# Patient Record
Sex: Male | Born: 1935 | Race: White | Hispanic: No | Marital: Married
Health system: Southern US, Community
[De-identification: ages and names within clinical notes are randomized; demographics above are authoritative.]

## PROBLEM LIST (undated history)

## (undated) DIAGNOSIS — K648 Other hemorrhoids: Secondary | ICD-10-CM

## (undated) DIAGNOSIS — D75839 Thrombocytosis, unspecified: Secondary | ICD-10-CM

## (undated) DIAGNOSIS — K5790 Diverticulosis of intestine, part unspecified, without perforation or abscess without bleeding: Secondary | ICD-10-CM

## (undated) DIAGNOSIS — I10 Essential (primary) hypertension: Secondary | ICD-10-CM

## (undated) DIAGNOSIS — K222 Esophageal obstruction: Secondary | ICD-10-CM

## (undated) DIAGNOSIS — H348122 Central retinal vein occlusion, left eye, stable: Secondary | ICD-10-CM

## (undated) DIAGNOSIS — M1990 Unspecified osteoarthritis, unspecified site: Secondary | ICD-10-CM

## (undated) DIAGNOSIS — D631 Anemia in chronic kidney disease: Secondary | ICD-10-CM

## (undated) DIAGNOSIS — Z5189 Encounter for other specified aftercare: Secondary | ICD-10-CM

## (undated) DIAGNOSIS — N189 Chronic kidney disease, unspecified: Secondary | ICD-10-CM

## (undated) DIAGNOSIS — K2210 Ulcer of esophagus without bleeding: Secondary | ICD-10-CM

## (undated) DIAGNOSIS — D473 Essential (hemorrhagic) thrombocythemia: Secondary | ICD-10-CM

## (undated) DIAGNOSIS — K219 Gastro-esophageal reflux disease without esophagitis: Secondary | ICD-10-CM

## (undated) DIAGNOSIS — I2510 Atherosclerotic heart disease of native coronary artery without angina pectoris: Secondary | ICD-10-CM

## (undated) DIAGNOSIS — I209 Angina pectoris, unspecified: Secondary | ICD-10-CM

## (undated) DIAGNOSIS — H9190 Unspecified hearing loss, unspecified ear: Secondary | ICD-10-CM

## (undated) DIAGNOSIS — D649 Anemia, unspecified: Secondary | ICD-10-CM

## (undated) HISTORY — DX: Ulcer of esophagus without bleeding: K22.10

## (undated) HISTORY — DX: Chronic kidney disease, unspecified: N18.9

## (undated) HISTORY — DX: Esophageal obstruction: K22.2

## (undated) HISTORY — DX: Atherosclerotic heart disease of native coronary artery without angina pectoris: I25.10

## (undated) HISTORY — DX: Anemia in chronic kidney disease: D63.1

## (undated) HISTORY — DX: Thrombocytosis, unspecified: D75.839

## (undated) HISTORY — DX: Central retinal vein occlusion, left eye, stable: H34.8122

## (undated) HISTORY — DX: Anemia, unspecified: D64.9

## (undated) HISTORY — DX: Encounter for other specified aftercare: Z51.89

## (undated) HISTORY — PX: EYE SURGERY: SHX253

## (undated) HISTORY — DX: Diverticulosis of intestine, part unspecified, without perforation or abscess without bleeding: K57.90

## (undated) HISTORY — DX: Other hemorrhoids: K64.8

## (undated) HISTORY — DX: Essential (hemorrhagic) thrombocythemia: D47.3

## (undated) HISTORY — PX: JOINT REPLACEMENT: SHX530

## (undated) HISTORY — PX: VASECTOMY: SHX75

## (undated) HISTORY — PX: OTHER SURGICAL HISTORY: SHX169

## (undated) HISTORY — DX: Essential (primary) hypertension: I10

---

## 1988-06-04 DIAGNOSIS — I209 Angina pectoris, unspecified: Secondary | ICD-10-CM

## 1988-06-04 HISTORY — DX: Angina pectoris, unspecified: I20.9

## 1999-01-30 ENCOUNTER — Ambulatory Visit (HOSPITAL_COMMUNITY): Admission: RE | Admit: 1999-01-30 | Discharge: 1999-01-30 | Payer: Self-pay | Admitting: *Deleted

## 1999-01-30 ENCOUNTER — Encounter: Payer: Self-pay | Admitting: *Deleted

## 1999-03-24 ENCOUNTER — Ambulatory Visit (HOSPITAL_COMMUNITY): Admission: RE | Admit: 1999-03-24 | Discharge: 1999-03-24 | Payer: Self-pay | Admitting: *Deleted

## 1999-03-24 ENCOUNTER — Encounter: Payer: Self-pay | Admitting: *Deleted

## 1999-05-11 ENCOUNTER — Ambulatory Visit (HOSPITAL_COMMUNITY): Admission: RE | Admit: 1999-05-11 | Discharge: 1999-05-11 | Payer: Self-pay | Admitting: Gastroenterology

## 1999-05-11 ENCOUNTER — Encounter: Payer: Self-pay | Admitting: Gastroenterology

## 1999-06-16 ENCOUNTER — Encounter: Payer: Self-pay | Admitting: Gastroenterology

## 1999-06-16 ENCOUNTER — Ambulatory Visit (HOSPITAL_COMMUNITY): Admission: RE | Admit: 1999-06-16 | Discharge: 1999-06-16 | Payer: Self-pay | Admitting: Gastroenterology

## 1999-07-25 ENCOUNTER — Encounter: Payer: Self-pay | Admitting: Gastroenterology

## 1999-07-25 ENCOUNTER — Encounter (INDEPENDENT_AMBULATORY_CARE_PROVIDER_SITE_OTHER): Payer: Self-pay

## 1999-07-25 ENCOUNTER — Ambulatory Visit (HOSPITAL_COMMUNITY): Admission: RE | Admit: 1999-07-25 | Discharge: 1999-07-25 | Payer: Self-pay | Admitting: Gastroenterology

## 1999-11-14 ENCOUNTER — Encounter: Payer: Self-pay | Admitting: Gastroenterology

## 1999-11-14 ENCOUNTER — Ambulatory Visit (HOSPITAL_COMMUNITY): Admission: RE | Admit: 1999-11-14 | Discharge: 1999-11-14 | Payer: Self-pay | Admitting: Gastroenterology

## 2000-07-03 ENCOUNTER — Encounter: Payer: Self-pay | Admitting: Urology

## 2000-07-04 ENCOUNTER — Ambulatory Visit (HOSPITAL_COMMUNITY): Admission: RE | Admit: 2000-07-04 | Discharge: 2000-07-04 | Payer: Self-pay | Admitting: Urology

## 2000-07-29 ENCOUNTER — Ambulatory Visit (HOSPITAL_COMMUNITY): Admission: RE | Admit: 2000-07-29 | Discharge: 2000-07-29 | Payer: Self-pay | Admitting: Gastroenterology

## 2000-07-29 ENCOUNTER — Encounter: Payer: Self-pay | Admitting: Gastroenterology

## 2001-02-26 ENCOUNTER — Encounter: Payer: Self-pay | Admitting: Gastroenterology

## 2001-02-26 ENCOUNTER — Ambulatory Visit (HOSPITAL_COMMUNITY): Admission: RE | Admit: 2001-02-26 | Discharge: 2001-02-26 | Payer: Self-pay | Admitting: Obstetrics & Gynecology

## 2001-05-08 ENCOUNTER — Ambulatory Visit (HOSPITAL_COMMUNITY): Admission: RE | Admit: 2001-05-08 | Discharge: 2001-05-08 | Payer: Self-pay | Admitting: General Surgery

## 2001-05-08 ENCOUNTER — Encounter (INDEPENDENT_AMBULATORY_CARE_PROVIDER_SITE_OTHER): Payer: Self-pay | Admitting: Specialist

## 2001-05-08 HISTORY — PX: INGUINAL HERNIA REPAIR: SUR1180

## 2002-04-27 ENCOUNTER — Encounter: Payer: Self-pay | Admitting: Gastroenterology

## 2002-04-27 ENCOUNTER — Ambulatory Visit (HOSPITAL_COMMUNITY): Admission: RE | Admit: 2002-04-27 | Discharge: 2002-04-27 | Payer: Self-pay | Admitting: Gastroenterology

## 2002-04-27 ENCOUNTER — Encounter (INDEPENDENT_AMBULATORY_CARE_PROVIDER_SITE_OTHER): Payer: Self-pay

## 2002-11-06 ENCOUNTER — Ambulatory Visit: Admission: RE | Admit: 2002-11-06 | Discharge: 2002-11-06 | Payer: Self-pay | Admitting: Family Medicine

## 2002-12-14 ENCOUNTER — Encounter: Payer: Self-pay | Admitting: Gastroenterology

## 2003-09-06 ENCOUNTER — Ambulatory Visit (HOSPITAL_COMMUNITY): Admission: RE | Admit: 2003-09-06 | Discharge: 2003-09-06 | Payer: Self-pay | Admitting: Gastroenterology

## 2004-04-15 ENCOUNTER — Ambulatory Visit: Payer: Self-pay | Admitting: Oncology

## 2004-06-12 ENCOUNTER — Ambulatory Visit: Payer: Self-pay | Admitting: Oncology

## 2004-08-06 ENCOUNTER — Ambulatory Visit: Payer: Self-pay | Admitting: Oncology

## 2004-09-05 ENCOUNTER — Ambulatory Visit (HOSPITAL_COMMUNITY): Admission: RE | Admit: 2004-09-05 | Discharge: 2004-09-05 | Payer: Self-pay | Admitting: Cardiology

## 2004-09-13 ENCOUNTER — Inpatient Hospital Stay (HOSPITAL_COMMUNITY): Admission: RE | Admit: 2004-09-13 | Discharge: 2004-09-18 | Payer: Self-pay | Admitting: Cardiothoracic Surgery

## 2004-09-29 ENCOUNTER — Ambulatory Visit: Payer: Self-pay | Admitting: Oncology

## 2004-10-05 ENCOUNTER — Ambulatory Visit: Payer: Self-pay | Admitting: Gastroenterology

## 2004-10-05 ENCOUNTER — Ambulatory Visit (HOSPITAL_COMMUNITY): Admission: RE | Admit: 2004-10-05 | Discharge: 2004-10-05 | Payer: Self-pay | Admitting: Gastroenterology

## 2004-10-08 ENCOUNTER — Inpatient Hospital Stay (HOSPITAL_COMMUNITY): Admission: EM | Admit: 2004-10-08 | Discharge: 2004-10-11 | Payer: Self-pay | Admitting: Emergency Medicine

## 2004-10-10 ENCOUNTER — Encounter (INDEPENDENT_AMBULATORY_CARE_PROVIDER_SITE_OTHER): Payer: Self-pay | Admitting: *Deleted

## 2004-10-13 ENCOUNTER — Encounter: Admission: RE | Admit: 2004-10-13 | Discharge: 2004-10-13 | Payer: Self-pay | Admitting: Cardiothoracic Surgery

## 2004-10-18 ENCOUNTER — Ambulatory Visit: Payer: Self-pay | Admitting: Gastroenterology

## 2004-10-23 ENCOUNTER — Ambulatory Visit: Payer: Self-pay | Admitting: Gastroenterology

## 2004-10-24 ENCOUNTER — Encounter (INDEPENDENT_AMBULATORY_CARE_PROVIDER_SITE_OTHER): Payer: Self-pay | Admitting: *Deleted

## 2004-10-24 ENCOUNTER — Ambulatory Visit: Payer: Self-pay | Admitting: Gastroenterology

## 2004-10-31 ENCOUNTER — Ambulatory Visit: Payer: Self-pay | Admitting: Gastroenterology

## 2004-11-01 ENCOUNTER — Encounter (HOSPITAL_COMMUNITY): Admission: RE | Admit: 2004-11-01 | Discharge: 2004-11-03 | Payer: Self-pay | Admitting: Oncology

## 2004-11-17 ENCOUNTER — Ambulatory Visit: Payer: Self-pay | Admitting: Oncology

## 2004-12-18 ENCOUNTER — Ambulatory Visit: Payer: Self-pay | Admitting: Gastroenterology

## 2005-01-12 ENCOUNTER — Ambulatory Visit: Payer: Self-pay | Admitting: Oncology

## 2005-03-02 ENCOUNTER — Ambulatory Visit: Payer: Self-pay | Admitting: Oncology

## 2005-04-30 ENCOUNTER — Ambulatory Visit: Payer: Self-pay | Admitting: Oncology

## 2005-06-25 ENCOUNTER — Ambulatory Visit: Payer: Self-pay | Admitting: Oncology

## 2005-08-17 ENCOUNTER — Ambulatory Visit: Payer: Self-pay | Admitting: Oncology

## 2005-09-17 LAB — CBC WITH DIFFERENTIAL/PLATELET
Basophils Absolute: 0 10*3/uL (ref 0.0–0.1)
Eosinophils Absolute: 0.1 10*3/uL (ref 0.0–0.5)
HGB: 10.9 g/dL — ABNORMAL LOW (ref 13.0–17.1)
LYMPH%: 20.4 % (ref 14.0–48.0)
MCV: 98.9 fL — ABNORMAL HIGH (ref 81.6–98.0)
MONO%: 9.4 % (ref 0.0–13.0)
NEUT#: 2.2 10*3/uL (ref 1.5–6.5)
Platelets: 291 10*3/uL (ref 145–400)
RBC: 3.24 10*6/uL — ABNORMAL LOW (ref 4.20–5.71)

## 2005-10-01 LAB — CBC WITH DIFFERENTIAL/PLATELET
BASO%: 1.1 % (ref 0.0–2.0)
LYMPH%: 20.7 % (ref 14.0–48.0)
MCH: 34.1 pg — ABNORMAL HIGH (ref 28.0–33.4)
MCHC: 34.3 g/dL (ref 32.0–35.9)
MCV: 99.4 fL — ABNORMAL HIGH (ref 81.6–98.0)
MONO%: 8.6 % (ref 0.0–13.0)
Platelets: 344 10*3/uL (ref 145–400)
RBC: 3.41 10*6/uL — ABNORMAL LOW (ref 4.20–5.71)

## 2005-10-12 ENCOUNTER — Ambulatory Visit: Payer: Self-pay | Admitting: Oncology

## 2005-10-16 LAB — COMPREHENSIVE METABOLIC PANEL
AST: 12 U/L (ref 0–37)
Albumin: 4 g/dL (ref 3.5–5.2)
BUN: 37 mg/dL — ABNORMAL HIGH (ref 6–23)
CO2: 19 mEq/L (ref 19–32)
Calcium: 8.3 mg/dL — ABNORMAL LOW (ref 8.4–10.5)
Chloride: 103 mEq/L (ref 96–112)
Creatinine, Ser: 1.7 mg/dL — ABNORMAL HIGH (ref 0.4–1.5)
Potassium: 5.8 mEq/L — ABNORMAL HIGH (ref 3.5–5.3)

## 2005-10-16 LAB — CBC WITH DIFFERENTIAL/PLATELET
Basophils Absolute: 0 10*3/uL (ref 0.0–0.1)
EOS%: 3.3 % (ref 0.0–7.0)
Eosinophils Absolute: 0.1 10*3/uL (ref 0.0–0.5)
HCT: 36.7 % — ABNORMAL LOW (ref 38.7–49.9)
HGB: 12.5 g/dL — ABNORMAL LOW (ref 13.0–17.1)
MCH: 33.7 pg — ABNORMAL HIGH (ref 28.0–33.4)
MONO#: 0.3 10*3/uL (ref 0.1–0.9)
NEUT#: 2.7 10*3/uL (ref 1.5–6.5)
NEUT%: 65.7 % (ref 40.0–75.0)
RDW: 13.8 % (ref 11.2–14.6)
WBC: 4.1 10*3/uL (ref 4.0–10.0)
lymph#: 0.9 10*3/uL (ref 0.9–3.3)

## 2005-10-16 LAB — LACTATE DEHYDROGENASE: LDH: 154 U/L (ref 94–250)

## 2005-10-30 ENCOUNTER — Ambulatory Visit: Payer: Self-pay | Admitting: Gastroenterology

## 2005-10-30 LAB — CBC WITH DIFFERENTIAL/PLATELET
EOS%: 2.3 % (ref 0.0–7.0)
HGB: 11.8 g/dL — ABNORMAL LOW (ref 13.0–17.1)
MCH: 33.6 pg — ABNORMAL HIGH (ref 28.0–33.4)
MCV: 97.7 fL (ref 81.6–98.0)
MONO%: 7.2 % (ref 0.0–13.0)
NEUT#: 3.4 10*3/uL (ref 1.5–6.5)
RBC: 3.5 10*6/uL — ABNORMAL LOW (ref 4.20–5.71)
RDW: 13.1 % (ref 11.2–14.6)
lymph#: 1.1 10*3/uL (ref 0.9–3.3)

## 2005-11-01 ENCOUNTER — Ambulatory Visit: Payer: Self-pay | Admitting: Gastroenterology

## 2005-11-05 LAB — CBC WITH DIFFERENTIAL/PLATELET
BASO%: 0.7 % (ref 0.0–2.0)
Basophils Absolute: 0 10*3/uL (ref 0.0–0.1)
Eosinophils Absolute: 0.1 10*3/uL (ref 0.0–0.5)
HCT: 32.3 % — ABNORMAL LOW (ref 38.7–49.9)
HGB: 10.9 g/dL — ABNORMAL LOW (ref 13.0–17.1)
LYMPH%: 16.2 % (ref 14.0–48.0)
MONO#: 0.4 10*3/uL (ref 0.1–0.9)
NEUT#: 4.2 10*3/uL (ref 1.5–6.5)
NEUT%: 73.5 % (ref 40.0–75.0)
Platelets: 408 10*3/uL — ABNORMAL HIGH (ref 145–400)
WBC: 5.7 10*3/uL (ref 4.0–10.0)
lymph#: 0.9 10*3/uL (ref 0.9–3.3)

## 2005-11-12 LAB — CBC WITH DIFFERENTIAL/PLATELET
BASO%: 0.8 % (ref 0.0–2.0)
EOS%: 2.3 % (ref 0.0–7.0)
LYMPH%: 20.5 % (ref 14.0–48.0)
MCH: 33.4 pg (ref 28.0–33.4)
MCHC: 34 g/dL (ref 32.0–35.9)
MONO#: 0.3 10*3/uL (ref 0.1–0.9)
NEUT%: 68.8 % (ref 40.0–75.0)
Platelets: 287 10*3/uL (ref 145–400)
RBC: 3.52 10*6/uL — ABNORMAL LOW (ref 4.20–5.71)
WBC: 4.5 10*3/uL (ref 4.0–10.0)

## 2005-11-26 ENCOUNTER — Ambulatory Visit: Payer: Self-pay | Admitting: Oncology

## 2005-11-26 LAB — CBC WITH DIFFERENTIAL/PLATELET
BASO%: 1.2 % (ref 0.0–2.0)
EOS%: 2.1 % (ref 0.0–7.0)
HCT: 36.8 % — ABNORMAL LOW (ref 38.7–49.9)
MCH: 34 pg — ABNORMAL HIGH (ref 28.0–33.4)
MCHC: 34.2 g/dL (ref 32.0–35.9)
MONO#: 0.3 10*3/uL (ref 0.1–0.9)
NEUT%: 70.4 % (ref 40.0–75.0)
RBC: 3.71 10*6/uL — ABNORMAL LOW (ref 4.20–5.71)
RDW: 15 % — ABNORMAL HIGH (ref 11.2–14.6)
WBC: 4.4 10*3/uL (ref 4.0–10.0)
lymph#: 0.8 10*3/uL — ABNORMAL LOW (ref 0.9–3.3)

## 2005-12-24 LAB — CBC WITH DIFFERENTIAL/PLATELET
BASO%: 0.5 % (ref 0.0–2.0)
Eosinophils Absolute: 0.1 10*3/uL (ref 0.0–0.5)
LYMPH%: 19.9 % (ref 14.0–48.0)
MCHC: 34.7 g/dL (ref 32.0–35.9)
MONO#: 0.3 10*3/uL (ref 0.1–0.9)
NEUT#: 3.5 10*3/uL (ref 1.5–6.5)
Platelets: 307 10*3/uL (ref 145–400)
RBC: 3 10*6/uL — ABNORMAL LOW (ref 4.20–5.71)
WBC: 4.9 10*3/uL (ref 4.0–10.0)
lymph#: 1 10*3/uL (ref 0.9–3.3)

## 2006-01-16 ENCOUNTER — Ambulatory Visit: Payer: Self-pay | Admitting: Oncology

## 2006-01-18 LAB — COMPREHENSIVE METABOLIC PANEL
ALT: <8 U/L (ref 0–40)
AST: 12 U/L (ref 0–37)
Albumin: 4.1 g/dL (ref 3.5–5.2)
CO2: 21 mEq/L (ref 19–32)
Calcium: 8.7 mg/dL (ref 8.4–10.5)
Chloride: 110 mEq/L (ref 96–112)
Creatinine, Ser: 1.69 mg/dL — ABNORMAL HIGH (ref 0.40–1.50)
Potassium: 5.7 mEq/L — ABNORMAL HIGH (ref 3.5–5.3)

## 2006-01-18 LAB — CBC WITH DIFFERENTIAL/PLATELET
BASO%: 1.3 % (ref 0.0–2.0)
Basophils Absolute: 0.1 10*3/uL (ref 0.0–0.1)
EOS%: 1.8 % (ref 0.0–7.0)
MCH: 33.7 pg — ABNORMAL HIGH (ref 28.0–33.4)
MCV: 99 fL — ABNORMAL HIGH (ref 81.6–98.0)
MONO#: 0.3 10*3/uL (ref 0.1–0.9)
NEUT#: 2.9 10*3/uL (ref 1.5–6.5)
NEUT%: 71.2 % (ref 40.0–75.0)
Platelets: 295 10*3/uL (ref 145–400)
RDW: 14.3 % (ref 11.2–14.6)
lymph#: 0.8 10*3/uL — ABNORMAL LOW (ref 0.9–3.3)

## 2006-02-05 LAB — CBC WITH DIFFERENTIAL/PLATELET
EOS%: 2 % (ref 0.0–7.0)
LYMPH%: 20.5 % (ref 14.0–48.0)
MCH: 34.7 pg — ABNORMAL HIGH (ref 28.0–33.4)
MCV: 101 fL — ABNORMAL HIGH (ref 81.6–98.0)
MONO%: 7.9 % (ref 0.0–13.0)
Platelets: 297 10*3/uL (ref 145–400)
RBC: 3.14 10*6/uL — ABNORMAL LOW (ref 4.20–5.71)
RDW: 14.9 % — ABNORMAL HIGH (ref 11.2–14.6)

## 2006-02-25 LAB — CBC WITH DIFFERENTIAL/PLATELET
BASO%: 0.7 % (ref 0.0–2.0)
Eosinophils Absolute: 0.1 10*3/uL (ref 0.0–0.5)
LYMPH%: 19.5 % (ref 14.0–48.0)
MCHC: 34 g/dL (ref 32.0–35.9)
MCV: 102.1 fL — ABNORMAL HIGH (ref 81.6–98.0)
MONO#: 0.3 10*3/uL (ref 0.1–0.9)
MONO%: 6.7 % (ref 0.0–13.0)
NEUT#: 2.9 10*3/uL (ref 1.5–6.5)
RBC: 3.24 10*6/uL — ABNORMAL LOW (ref 4.20–5.71)
RDW: 14.1 % (ref 11.2–14.6)
WBC: 4.1 10*3/uL (ref 4.0–10.0)

## 2006-03-14 ENCOUNTER — Ambulatory Visit: Payer: Self-pay | Admitting: Oncology

## 2006-03-18 LAB — CBC WITH DIFFERENTIAL/PLATELET
Basophils Absolute: 0 10*3/uL (ref 0.0–0.1)
Eosinophils Absolute: 0.1 10*3/uL (ref 0.0–0.5)
HCT: 31.3 % — ABNORMAL LOW (ref 38.7–49.9)
HGB: 10.7 g/dL — ABNORMAL LOW (ref 13.0–17.1)
LYMPH%: 18.5 % (ref 14.0–48.0)
MCV: 101.1 fL — ABNORMAL HIGH (ref 81.6–98.0)
MONO%: 6.5 % (ref 0.0–13.0)
NEUT#: 2.8 10*3/uL (ref 1.5–6.5)
NEUT%: 71.3 % (ref 40.0–75.0)
Platelets: 213 10*3/uL (ref 145–400)
RDW: 13.7 % (ref 11.2–14.6)

## 2006-04-08 LAB — CBC WITH DIFFERENTIAL/PLATELET
Basophils Absolute: 0.1 10*3/uL (ref 0.0–0.1)
EOS%: 2.7 % (ref 0.0–7.0)
Eosinophils Absolute: 0.1 10*3/uL (ref 0.0–0.5)
HCT: 32.1 % — ABNORMAL LOW (ref 38.7–49.9)
HGB: 10.9 g/dL — ABNORMAL LOW (ref 13.0–17.1)
MCH: 34.4 pg — ABNORMAL HIGH (ref 28.0–33.4)
NEUT#: 2.5 10*3/uL (ref 1.5–6.5)
NEUT%: 68.2 % (ref 40.0–75.0)
lymph#: 0.7 10*3/uL — ABNORMAL LOW (ref 0.9–3.3)

## 2006-04-08 LAB — COMPREHENSIVE METABOLIC PANEL
AST: 12 U/L (ref 0–37)
Albumin: 3.9 g/dL (ref 3.5–5.2)
BUN: 34 mg/dL — ABNORMAL HIGH (ref 6–23)
CO2: 21 mEq/L (ref 19–32)
Calcium: 8.2 mg/dL — ABNORMAL LOW (ref 8.4–10.5)
Chloride: 109 mEq/L (ref 96–112)
Creatinine, Ser: 1.47 mg/dL (ref 0.40–1.50)
Glucose, Bld: 139 mg/dL — ABNORMAL HIGH (ref 70–99)

## 2006-04-08 LAB — LACTATE DEHYDROGENASE: LDH: 152 U/L (ref 94–250)

## 2006-04-29 ENCOUNTER — Ambulatory Visit: Payer: Self-pay | Admitting: Oncology

## 2006-04-29 LAB — CBC WITH DIFFERENTIAL/PLATELET
Basophils Absolute: 0 10*3/uL (ref 0.0–0.1)
Eosinophils Absolute: 0.1 10*3/uL (ref 0.0–0.5)
HCT: 33.4 % — ABNORMAL LOW (ref 38.7–49.9)
HGB: 11.2 g/dL — ABNORMAL LOW (ref 13.0–17.1)
LYMPH%: 21.8 % (ref 14.0–48.0)
MONO#: 0.3 10*3/uL (ref 0.1–0.9)
NEUT%: 67.2 % (ref 40.0–75.0)
Platelets: 227 10*3/uL (ref 145–400)
WBC: 3.8 10*3/uL — ABNORMAL LOW (ref 4.0–10.0)
lymph#: 0.8 10*3/uL — ABNORMAL LOW (ref 0.9–3.3)

## 2006-06-04 HISTORY — PX: CORONARY ARTERY BYPASS GRAFT: SHX141

## 2006-06-10 LAB — CBC WITH DIFFERENTIAL/PLATELET
BASO%: 1.1 % (ref 0.0–2.0)
EOS%: 1.9 % (ref 0.0–7.0)
LYMPH%: 20.3 % (ref 14.0–48.0)
MCHC: 33.8 g/dL (ref 32.0–35.9)
MONO#: 0.3 10*3/uL (ref 0.1–0.9)
RBC: 2.8 10*6/uL — ABNORMAL LOW (ref 4.20–5.71)
WBC: 4.2 10*3/uL (ref 4.0–10.0)
lymph#: 0.8 10*3/uL — ABNORMAL LOW (ref 0.9–3.3)

## 2006-06-10 LAB — COMPREHENSIVE METABOLIC PANEL
ALT: 8 U/L (ref 0–53)
AST: 11 U/L (ref 0–37)
CO2: 19 mEq/L (ref 19–32)
Sodium: 134 mEq/L — ABNORMAL LOW (ref 135–145)
Total Bilirubin: 0.4 mg/dL (ref 0.3–1.2)
Total Protein: 6.3 g/dL (ref 6.0–8.3)

## 2006-06-26 ENCOUNTER — Ambulatory Visit: Payer: Self-pay | Admitting: Oncology

## 2006-07-01 LAB — CBC WITH DIFFERENTIAL/PLATELET
BASO%: 1.5 % (ref 0.0–2.0)
EOS%: 1.7 % (ref 0.0–7.0)
LYMPH%: 20.3 % (ref 14.0–48.0)
MCH: 34.7 pg — ABNORMAL HIGH (ref 28.0–33.4)
MCHC: 33.8 g/dL (ref 32.0–35.9)
MCV: 102.4 fL — ABNORMAL HIGH (ref 81.6–98.0)
MONO#: 0.3 10*3/uL (ref 0.1–0.9)
MONO%: 6.4 % (ref 0.0–13.0)
NEUT%: 70.1 % (ref 40.0–75.0)
Platelets: 281 10*3/uL (ref 145–400)
RBC: 3.14 10*6/uL — ABNORMAL LOW (ref 4.20–5.71)
WBC: 4 10*3/uL (ref 4.0–10.0)

## 2006-07-10 ENCOUNTER — Ambulatory Visit: Payer: Self-pay | Admitting: Vascular Surgery

## 2006-07-22 LAB — COMPREHENSIVE METABOLIC PANEL
Alkaline Phosphatase: 75 U/L (ref 39–117)
BUN: 49 mg/dL — ABNORMAL HIGH (ref 6–23)
Glucose, Bld: 137 mg/dL — ABNORMAL HIGH (ref 70–99)
Sodium: 134 mEq/L — ABNORMAL LOW (ref 135–145)
Total Bilirubin: 0.4 mg/dL (ref 0.3–1.2)

## 2006-07-22 LAB — CBC WITH DIFFERENTIAL/PLATELET
Basophils Absolute: 0.1 10*3/uL (ref 0.0–0.1)
Eosinophils Absolute: 0.1 10*3/uL (ref 0.0–0.5)
LYMPH%: 17.8 % (ref 14.0–48.0)
MCV: 101 fL — ABNORMAL HIGH (ref 81.6–98.0)
MONO%: 7.7 % (ref 0.0–13.0)
NEUT#: 2.7 10*3/uL (ref 1.5–6.5)
Platelets: 158 10*3/uL (ref 145–400)
RBC: 3.32 10*6/uL — ABNORMAL LOW (ref 4.20–5.71)

## 2006-08-07 ENCOUNTER — Ambulatory Visit: Payer: Self-pay | Admitting: Oncology

## 2006-08-12 LAB — CBC WITH DIFFERENTIAL/PLATELET
Eosinophils Absolute: 0.1 10*3/uL (ref 0.0–0.5)
HCT: 34.3 % — ABNORMAL LOW (ref 38.7–49.9)
HGB: 11.6 g/dL — ABNORMAL LOW (ref 13.0–17.1)
LYMPH%: 19.5 % (ref 14.0–48.0)
MONO#: 0.3 10*3/uL (ref 0.1–0.9)
NEUT#: 2.9 10*3/uL (ref 1.5–6.5)
NEUT%: 70.7 % (ref 40.0–75.0)
Platelets: 358 10*3/uL (ref 145–400)
WBC: 4.2 10*3/uL (ref 4.0–10.0)

## 2006-08-28 ENCOUNTER — Encounter: Admission: RE | Admit: 2006-08-28 | Discharge: 2006-08-28 | Payer: Self-pay | Admitting: Cardiology

## 2006-09-02 LAB — CBC WITH DIFFERENTIAL/PLATELET
BASO%: 1 % (ref 0.0–2.0)
HCT: 35.2 % — ABNORMAL LOW (ref 38.7–49.9)
LYMPH%: 19.9 % (ref 14.0–48.0)
MCH: 33.9 pg — ABNORMAL HIGH (ref 28.0–33.4)
MCHC: 34.2 g/dL (ref 32.0–35.9)
MONO#: 0.4 10*3/uL (ref 0.1–0.9)
NEUT%: 68.5 % (ref 40.0–75.0)
Platelets: 285 10*3/uL (ref 145–400)
WBC: 4.4 10*3/uL (ref 4.0–10.0)

## 2006-09-07 ENCOUNTER — Emergency Department (HOSPITAL_COMMUNITY): Admission: EM | Admit: 2006-09-07 | Discharge: 2006-09-07 | Payer: Self-pay | Admitting: Emergency Medicine

## 2006-09-23 ENCOUNTER — Ambulatory Visit: Payer: Self-pay | Admitting: Oncology

## 2006-09-23 LAB — CBC WITH DIFFERENTIAL/PLATELET
BASO%: 1.2 % (ref 0.0–2.0)
EOS%: 2.9 % (ref 0.0–7.0)
HCT: 33.6 % — ABNORMAL LOW (ref 38.7–49.9)
LYMPH%: 21.1 % (ref 14.0–48.0)
MCH: 34.1 pg — ABNORMAL HIGH (ref 28.0–33.4)
MCHC: 34.7 g/dL (ref 32.0–35.9)
MCV: 98.3 fL — ABNORMAL HIGH (ref 81.6–98.0)
MONO%: 7.8 % (ref 0.0–13.0)
NEUT%: 67 % (ref 40.0–75.0)
lymph#: 0.8 10*3/uL — ABNORMAL LOW (ref 0.9–3.3)

## 2006-10-14 LAB — CBC WITH DIFFERENTIAL/PLATELET
Basophils Absolute: 0 10*3/uL (ref 0.0–0.1)
EOS%: 2 % (ref 0.0–7.0)
HCT: 33.8 % — ABNORMAL LOW (ref 38.7–49.9)
HGB: 11.8 g/dL — ABNORMAL LOW (ref 13.0–17.1)
MCH: 34.1 pg — ABNORMAL HIGH (ref 28.0–33.4)
MCHC: 34.8 g/dL (ref 32.0–35.9)
MCV: 97.9 fL (ref 81.6–98.0)
MONO%: 7.2 % (ref 0.0–13.0)
NEUT%: 70.3 % (ref 40.0–75.0)
RDW: 13.5 % (ref 11.2–14.6)

## 2006-10-14 LAB — COMPREHENSIVE METABOLIC PANEL
AST: 10 U/L (ref 0–37)
Alkaline Phosphatase: 88 U/L (ref 39–117)
BUN: 49 mg/dL — ABNORMAL HIGH (ref 6–23)
Creatinine, Ser: 1.73 mg/dL — ABNORMAL HIGH (ref 0.40–1.50)

## 2006-10-17 ENCOUNTER — Ambulatory Visit: Payer: Self-pay | Admitting: Vascular Surgery

## 2006-10-23 ENCOUNTER — Ambulatory Visit (HOSPITAL_COMMUNITY): Admission: RE | Admit: 2006-10-23 | Discharge: 2006-10-23 | Payer: Self-pay | Admitting: *Deleted

## 2006-10-23 ENCOUNTER — Ambulatory Visit: Payer: Self-pay | Admitting: *Deleted

## 2006-11-04 LAB — CBC WITH DIFFERENTIAL/PLATELET
Basophils Absolute: 0 10*3/uL (ref 0.0–0.1)
Eosinophils Absolute: 0.1 10*3/uL (ref 0.0–0.5)
HCT: 33.4 % — ABNORMAL LOW (ref 38.7–49.9)
HGB: 11.6 g/dL — ABNORMAL LOW (ref 13.0–17.1)
LYMPH%: 19.8 % (ref 14.0–48.0)
MCV: 96.8 fL (ref 81.6–98.0)
MONO%: 7.6 % (ref 0.0–13.0)
NEUT#: 3.2 10*3/uL (ref 1.5–6.5)
Platelets: 290 10*3/uL (ref 145–400)

## 2006-11-07 ENCOUNTER — Ambulatory Visit: Payer: Self-pay | Admitting: Oncology

## 2006-11-25 LAB — CBC WITH DIFFERENTIAL/PLATELET
Eosinophils Absolute: 0.1 10*3/uL (ref 0.0–0.5)
LYMPH%: 18.7 % (ref 14.0–48.0)
MCHC: 34.1 g/dL (ref 32.0–35.9)
MCV: 97.4 fL (ref 81.6–98.0)
MONO%: 6.7 % (ref 0.0–13.0)
NEUT#: 2.9 10*3/uL (ref 1.5–6.5)
Platelets: 178 10*3/uL (ref 145–400)
RBC: 3.46 10*6/uL — ABNORMAL LOW (ref 4.20–5.71)

## 2006-12-16 LAB — CBC WITH DIFFERENTIAL/PLATELET
Basophils Absolute: 0 10*3/uL (ref 0.0–0.1)
Eosinophils Absolute: 0.1 10*3/uL (ref 0.0–0.5)
HGB: 11.4 g/dL — ABNORMAL LOW (ref 13.0–17.1)
MCV: 97.5 fL (ref 81.6–98.0)
MONO#: 0.3 10*3/uL (ref 0.1–0.9)
NEUT#: 3.3 10*3/uL (ref 1.5–6.5)
RBC: 3.37 10*6/uL — ABNORMAL LOW (ref 4.20–5.71)
RDW: 13.9 % (ref 11.2–14.6)
WBC: 4.5 10*3/uL (ref 4.0–10.0)
lymph#: 0.7 10*3/uL — ABNORMAL LOW (ref 0.9–3.3)

## 2007-01-08 ENCOUNTER — Ambulatory Visit: Payer: Self-pay | Admitting: Oncology

## 2007-01-10 LAB — CBC WITH DIFFERENTIAL/PLATELET
Basophils Absolute: 0 10*3/uL (ref 0.0–0.1)
Eosinophils Absolute: 0.1 10*3/uL (ref 0.0–0.5)
HCT: 31.1 % — ABNORMAL LOW (ref 38.7–49.9)
HGB: 11.1 g/dL — ABNORMAL LOW (ref 13.0–17.1)
LYMPH%: 18.4 % (ref 14.0–48.0)
MCV: 97.4 fL (ref 81.6–98.0)
MONO#: 0.4 10*3/uL (ref 0.1–0.9)
MONO%: 10.6 % (ref 0.0–13.0)
NEUT#: 2.4 10*3/uL (ref 1.5–6.5)
Platelets: 115 10*3/uL — ABNORMAL LOW (ref 145–400)
WBC: 3.5 10*3/uL — ABNORMAL LOW (ref 4.0–10.0)

## 2007-01-10 LAB — COMPREHENSIVE METABOLIC PANEL
Albumin: 3.8 g/dL (ref 3.5–5.2)
Alkaline Phosphatase: 83 U/L (ref 39–117)
BUN: 59 mg/dL — ABNORMAL HIGH (ref 6–23)
CO2: 20 mEq/L (ref 19–32)
Glucose, Bld: 128 mg/dL — ABNORMAL HIGH (ref 70–99)
Sodium: 132 mEq/L — ABNORMAL LOW (ref 135–145)
Total Bilirubin: 0.5 mg/dL (ref 0.3–1.2)
Total Protein: 6.2 g/dL (ref 6.0–8.3)

## 2007-01-27 LAB — CBC WITH DIFFERENTIAL/PLATELET
Basophils Absolute: 0 10*3/uL (ref 0.0–0.1)
Eosinophils Absolute: 0.1 10*3/uL (ref 0.0–0.5)
HGB: 8.8 g/dL — ABNORMAL LOW (ref 13.0–17.1)
MCV: 97 fL (ref 81.6–98.0)
MONO#: 0.4 10*3/uL (ref 0.1–0.9)
NEUT#: 3.2 10*3/uL (ref 1.5–6.5)
RBC: 2.48 10*6/uL — ABNORMAL LOW (ref 4.20–5.71)
RDW: 13 % (ref 11.2–14.6)
WBC: 4.2 10*3/uL (ref 4.0–10.0)

## 2007-02-10 LAB — CBC WITH DIFFERENTIAL/PLATELET
Basophils Absolute: 0 10*3/uL (ref 0.0–0.1)
Eosinophils Absolute: 0.1 10*3/uL (ref 0.0–0.5)
HCT: 27.6 % — ABNORMAL LOW (ref 38.7–49.9)
HGB: 9.9 g/dL — ABNORMAL LOW (ref 13.0–17.1)
LYMPH%: 18.2 % (ref 14.0–48.0)
MCV: 100.1 fL — ABNORMAL HIGH (ref 81.6–98.0)
MONO#: 0.4 10*3/uL (ref 0.1–0.9)
MONO%: 10.2 % (ref 0.0–13.0)
NEUT#: 2.5 10*3/uL (ref 1.5–6.5)
NEUT%: 68.9 % (ref 40.0–75.0)
Platelets: 162 10*3/uL (ref 145–400)
WBC: 3.6 10*3/uL — ABNORMAL LOW (ref 4.0–10.0)

## 2007-02-19 ENCOUNTER — Ambulatory Visit: Payer: Self-pay | Admitting: Oncology

## 2007-02-24 LAB — CBC WITH DIFFERENTIAL/PLATELET
BASO%: 1.2 % (ref 0.0–2.0)
HCT: 31.5 % — ABNORMAL LOW (ref 38.7–49.9)
LYMPH%: 14.9 % (ref 14.0–48.0)
MCH: 35.7 pg — ABNORMAL HIGH (ref 28.0–33.4)
MCHC: 35 g/dL (ref 32.0–35.9)
MCV: 102.1 fL — ABNORMAL HIGH (ref 81.6–98.0)
MONO#: 0.4 10*3/uL (ref 0.1–0.9)
MONO%: 10.3 % (ref 0.0–13.0)
NEUT%: 71.4 % (ref 40.0–75.0)
Platelets: 131 10*3/uL — ABNORMAL LOW (ref 145–400)
WBC: 3.6 10*3/uL — ABNORMAL LOW (ref 4.0–10.0)

## 2007-02-24 LAB — BASIC METABOLIC PANEL
BUN: 59 mg/dL — ABNORMAL HIGH (ref 6–23)
Calcium: 8.6 mg/dL (ref 8.4–10.5)
Glucose, Bld: 136 mg/dL — ABNORMAL HIGH (ref 70–99)

## 2007-03-10 LAB — CBC WITH DIFFERENTIAL/PLATELET
BASO%: 0.6 % (ref 0.0–2.0)
EOS%: 3.8 % (ref 0.0–7.0)
LYMPH%: 22.2 % (ref 14.0–48.0)
MCH: 35.8 pg — ABNORMAL HIGH (ref 28.0–33.4)
MCHC: 35.2 g/dL (ref 32.0–35.9)
MONO#: 0.4 10*3/uL (ref 0.1–0.9)
RBC: 3.3 10*6/uL — ABNORMAL LOW (ref 4.20–5.71)
WBC: 2.9 10*3/uL — ABNORMAL LOW (ref 4.0–10.0)
lymph#: 0.6 10*3/uL — ABNORMAL LOW (ref 0.9–3.3)

## 2007-03-24 LAB — CBC WITH DIFFERENTIAL/PLATELET
BASO%: 1 % (ref 0.0–2.0)
EOS%: 2.3 % (ref 0.0–7.0)
HCT: 31.6 % — ABNORMAL LOW (ref 38.7–49.9)
HGB: 10.9 g/dL — ABNORMAL LOW (ref 13.0–17.1)
MCHC: 34.4 g/dL (ref 32.0–35.9)
MONO#: 0.4 10*3/uL (ref 0.1–0.9)
NEUT%: 64.2 % (ref 40.0–75.0)
RDW: 12.8 % (ref 11.2–14.6)
WBC: 3.2 10*3/uL — ABNORMAL LOW (ref 4.0–10.0)
lymph#: 0.6 10*3/uL — ABNORMAL LOW (ref 0.9–3.3)

## 2007-03-24 LAB — COMPREHENSIVE METABOLIC PANEL
ALT: 8 U/L (ref 0–53)
AST: 13 U/L (ref 0–37)
Albumin: 4 g/dL (ref 3.5–5.2)
CO2: 24 mEq/L (ref 19–32)
Calcium: 8.4 mg/dL (ref 8.4–10.5)
Chloride: 100 mEq/L (ref 96–112)
Creatinine, Ser: 1.59 mg/dL — ABNORMAL HIGH (ref 0.40–1.50)
Potassium: 4.9 mEq/L (ref 3.5–5.3)
Sodium: 136 mEq/L (ref 135–145)
Total Protein: 6.3 g/dL (ref 6.0–8.3)

## 2007-03-24 LAB — LACTATE DEHYDROGENASE: LDH: 162 U/L (ref 94–250)

## 2007-04-07 ENCOUNTER — Ambulatory Visit: Payer: Self-pay | Admitting: Gastroenterology

## 2007-04-10 ENCOUNTER — Ambulatory Visit: Payer: Self-pay | Admitting: Oncology

## 2007-04-14 LAB — CBC WITH DIFFERENTIAL/PLATELET
EOS%: 2.9 % (ref 0.0–7.0)
Eosinophils Absolute: 0.1 10*3/uL (ref 0.0–0.5)
LYMPH%: 19.2 % (ref 14.0–48.0)
MCH: 35.3 pg — ABNORMAL HIGH (ref 28.0–33.4)
MCV: 98.9 fL — ABNORMAL HIGH (ref 81.6–98.0)
MONO%: 13.5 % — ABNORMAL HIGH (ref 0.0–13.0)
NEUT#: 2.8 10*3/uL (ref 1.5–6.5)
Platelets: 289 10*3/uL (ref 145–400)
RBC: 2.71 10*6/uL — ABNORMAL LOW (ref 4.20–5.71)
RDW: 12.7 % (ref 11.2–14.6)

## 2007-04-15 ENCOUNTER — Ambulatory Visit (HOSPITAL_COMMUNITY): Admission: RE | Admit: 2007-04-15 | Discharge: 2007-04-15 | Payer: Self-pay | Admitting: Gastroenterology

## 2007-04-18 ENCOUNTER — Ambulatory Visit: Payer: Self-pay | Admitting: Gastroenterology

## 2007-04-29 ENCOUNTER — Ambulatory Visit (HOSPITAL_COMMUNITY): Admission: RE | Admit: 2007-04-29 | Discharge: 2007-04-29 | Payer: Self-pay | Admitting: Gastroenterology

## 2007-05-05 LAB — CBC WITH DIFFERENTIAL/PLATELET
BASO%: 0.8 % (ref 0.0–2.0)
LYMPH%: 17.3 % (ref 14.0–48.0)
MCHC: 34.8 g/dL (ref 32.0–35.9)
MCV: 98.1 fL — ABNORMAL HIGH (ref 81.6–98.0)
MONO#: 0.4 10*3/uL (ref 0.1–0.9)
MONO%: 7.9 % (ref 0.0–13.0)
Platelets: 306 10*3/uL (ref 145–400)
RBC: 3.04 10*6/uL — ABNORMAL LOW (ref 4.20–5.71)
RDW: 13.1 % (ref 11.2–14.6)
WBC: 4.7 10*3/uL (ref 4.0–10.0)

## 2007-05-22 ENCOUNTER — Ambulatory Visit: Payer: Self-pay | Admitting: Oncology

## 2007-05-26 LAB — CBC WITH DIFFERENTIAL/PLATELET
BASO%: 1.3 % (ref 0.0–2.0)
MCHC: 35.1 g/dL (ref 32.0–35.9)
MONO#: 0.3 10*3/uL (ref 0.1–0.9)
NEUT#: 3 10*3/uL (ref 1.5–6.5)
RBC: 3.09 10*6/uL — ABNORMAL LOW (ref 4.20–5.71)
RDW: 13.7 % (ref 11.2–14.6)
WBC: 4.4 10*3/uL (ref 4.0–10.0)
lymph#: 0.8 10*3/uL — ABNORMAL LOW (ref 0.9–3.3)

## 2007-06-09 ENCOUNTER — Ambulatory Visit: Payer: Self-pay | Admitting: Gastroenterology

## 2007-06-16 ENCOUNTER — Ambulatory Visit: Payer: Self-pay | Admitting: *Deleted

## 2007-06-16 LAB — CBC WITH DIFFERENTIAL/PLATELET
BASO%: 1 % (ref 0.0–2.0)
Basophils Absolute: 0.1 10*3/uL (ref 0.0–0.1)
Eosinophils Absolute: 0.2 10*3/uL (ref 0.0–0.5)
HCT: 34.3 % — ABNORMAL LOW (ref 38.7–49.9)
HGB: 11.6 g/dL — ABNORMAL LOW (ref 13.0–17.1)
MCHC: 34 g/dL (ref 32.0–35.9)
MONO#: 0.4 10*3/uL (ref 0.1–0.9)
NEUT#: 3.6 10*3/uL (ref 1.5–6.5)
NEUT%: 69.3 % (ref 40.0–75.0)
WBC: 5.2 10*3/uL (ref 4.0–10.0)
lymph#: 1 10*3/uL (ref 0.9–3.3)

## 2007-06-16 LAB — COMPREHENSIVE METABOLIC PANEL
ALT: 8 U/L (ref 0–53)
CO2: 20 mEq/L (ref 19–32)
Calcium: 8.8 mg/dL (ref 8.4–10.5)
Chloride: 103 mEq/L (ref 96–112)
Creatinine, Ser: 2.5 mg/dL — ABNORMAL HIGH (ref 0.40–1.50)

## 2007-06-16 LAB — LACTATE DEHYDROGENASE: LDH: 123 U/L (ref 94–250)

## 2007-06-19 ENCOUNTER — Ambulatory Visit (HOSPITAL_COMMUNITY): Admission: RE | Admit: 2007-06-19 | Discharge: 2007-06-19 | Payer: Self-pay | Admitting: Gastroenterology

## 2007-07-03 ENCOUNTER — Ambulatory Visit: Payer: Self-pay | Admitting: Oncology

## 2007-07-07 LAB — CBC WITH DIFFERENTIAL/PLATELET
BASO%: 0.6 % (ref 0.0–2.0)
MCHC: 33.9 g/dL (ref 32.0–35.9)
MONO#: 0.5 10*3/uL (ref 0.1–0.9)
RBC: 2.94 10*6/uL — ABNORMAL LOW (ref 4.20–5.71)
WBC: 6.2 10*3/uL (ref 4.0–10.0)
lymph#: 1 10*3/uL (ref 0.9–3.3)

## 2007-07-08 ENCOUNTER — Ambulatory Visit: Payer: Self-pay | Admitting: Gastroenterology

## 2007-07-28 LAB — CBC WITH DIFFERENTIAL/PLATELET
BASO%: 0.3 % (ref 0.0–2.0)
Basophils Absolute: 0 10*3/uL (ref 0.0–0.1)
HCT: 30.8 % — ABNORMAL LOW (ref 38.7–49.9)
HGB: 10.8 g/dL — ABNORMAL LOW (ref 13.0–17.1)
MCHC: 35.1 g/dL (ref 32.0–35.9)
MONO#: 0.3 10*3/uL (ref 0.1–0.9)
NEUT%: 67.6 % (ref 40.0–75.0)
WBC: 4.3 10*3/uL (ref 4.0–10.0)
lymph#: 1 10*3/uL (ref 0.9–3.3)

## 2007-08-14 ENCOUNTER — Ambulatory Visit: Payer: Self-pay | Admitting: Oncology

## 2007-08-18 LAB — CBC WITH DIFFERENTIAL/PLATELET
BASO%: 0.9 % (ref 0.0–2.0)
LYMPH%: 20 % (ref 14.0–48.0)
MCHC: 33.9 g/dL (ref 32.0–35.9)
MCV: 96.3 fL (ref 81.6–98.0)
MONO#: 0.4 10*3/uL (ref 0.1–0.9)
MONO%: 7.7 % (ref 0.0–13.0)
Platelets: 231 10*3/uL (ref 145–400)
RBC: 3.4 10*6/uL — ABNORMAL LOW (ref 4.20–5.71)
WBC: 4.9 10*3/uL (ref 4.0–10.0)

## 2007-09-08 LAB — COMPREHENSIVE METABOLIC PANEL
ALT: <8 U/L (ref 0–53)
Alkaline Phosphatase: 77 U/L (ref 39–117)
CO2: 23 mEq/L (ref 19–32)
Creatinine, Ser: 1.86 mg/dL — ABNORMAL HIGH (ref 0.40–1.50)
Glucose, Bld: 138 mg/dL — ABNORMAL HIGH (ref 70–99)
Sodium: 135 mEq/L (ref 135–145)
Total Bilirubin: 0.4 mg/dL (ref 0.3–1.2)

## 2007-09-08 LAB — CBC WITH DIFFERENTIAL/PLATELET
BASO%: 1.6 % (ref 0.0–2.0)
HCT: 33.8 % — ABNORMAL LOW (ref 38.7–49.9)
LYMPH%: 21 % (ref 14.0–48.0)
MCHC: 33.6 g/dL (ref 32.0–35.9)
MCV: 97.9 fL (ref 81.6–98.0)
MONO#: 0.3 10*3/uL (ref 0.1–0.9)
MONO%: 6.6 % (ref 0.0–13.0)
NEUT%: 68.7 % (ref 40.0–75.0)
Platelets: 231 10*3/uL (ref 145–400)
RBC: 3.46 10*6/uL — ABNORMAL LOW (ref 4.20–5.71)
WBC: 4.8 10*3/uL (ref 4.0–10.0)

## 2007-09-08 LAB — LACTATE DEHYDROGENASE: LDH: 123 U/L (ref 94–250)

## 2007-09-15 ENCOUNTER — Ambulatory Visit: Payer: Self-pay | Admitting: Gastroenterology

## 2007-09-24 ENCOUNTER — Ambulatory Visit: Payer: Self-pay | Admitting: Oncology

## 2007-09-29 LAB — CBC WITH DIFFERENTIAL/PLATELET
Basophils Absolute: 0 10*3/uL (ref 0.0–0.1)
HCT: 34.2 % — ABNORMAL LOW (ref 38.7–49.9)
HGB: 11.7 g/dL — ABNORMAL LOW (ref 13.0–17.1)
LYMPH%: 19 % (ref 14.0–48.0)
MONO#: 0.4 10*3/uL (ref 0.1–0.9)
NEUT%: 70.6 % (ref 40.0–75.0)
Platelets: 212 10*3/uL (ref 145–400)
WBC: 4.9 10*3/uL (ref 4.0–10.0)
lymph#: 0.9 10*3/uL (ref 0.9–3.3)

## 2007-10-13 ENCOUNTER — Encounter: Payer: Self-pay | Admitting: Gastroenterology

## 2007-10-13 ENCOUNTER — Ambulatory Visit (HOSPITAL_COMMUNITY): Admission: RE | Admit: 2007-10-13 | Discharge: 2007-10-13 | Payer: Self-pay | Admitting: Gastroenterology

## 2007-10-23 ENCOUNTER — Ambulatory Visit: Payer: Self-pay | Admitting: Gastroenterology

## 2007-10-24 ENCOUNTER — Telehealth: Payer: Self-pay | Admitting: Gastroenterology

## 2007-11-06 ENCOUNTER — Ambulatory Visit: Payer: Self-pay | Admitting: Oncology

## 2007-11-10 LAB — CBC WITH DIFFERENTIAL/PLATELET
Basophils Absolute: 0.1 10*3/uL (ref 0.0–0.1)
Eosinophils Absolute: 0.1 10*3/uL (ref 0.0–0.5)
HGB: 11 g/dL — ABNORMAL LOW (ref 13.0–17.1)
MCV: 95 fL (ref 81.6–98.0)
MONO#: 0.3 10*3/uL (ref 0.1–0.9)
NEUT#: 3.2 10*3/uL (ref 1.5–6.5)
RDW: 14.5 % (ref 11.2–14.6)
WBC: 4.3 10*3/uL (ref 4.0–10.0)
lymph#: 0.7 10*3/uL — ABNORMAL LOW (ref 0.9–3.3)

## 2007-11-17 LAB — CBC WITH DIFFERENTIAL/PLATELET
Basophils Absolute: 0 10*3/uL (ref 0.0–0.1)
Eosinophils Absolute: 0.1 10*3/uL (ref 0.0–0.5)
HGB: 10.5 g/dL — ABNORMAL LOW (ref 13.0–17.1)
MCV: 96.1 fL (ref 81.6–98.0)
MONO#: 0.5 10*3/uL (ref 0.1–0.9)
MONO%: 9.3 % (ref 0.0–13.0)
NEUT#: 3.5 10*3/uL (ref 1.5–6.5)
RBC: 3.2 10*6/uL — ABNORMAL LOW (ref 4.20–5.71)
RDW: 14.7 % — ABNORMAL HIGH (ref 11.2–14.6)
WBC: 4.9 10*3/uL (ref 4.0–10.0)
lymph#: 0.8 10*3/uL — ABNORMAL LOW (ref 0.9–3.3)

## 2007-12-01 LAB — CBC WITH DIFFERENTIAL/PLATELET
Basophils Absolute: 0 10*3/uL (ref 0.0–0.1)
Eosinophils Absolute: 0.1 10*3/uL (ref 0.0–0.5)
HCT: 30.4 % — ABNORMAL LOW (ref 38.7–49.9)
HGB: 10.5 g/dL — ABNORMAL LOW (ref 13.0–17.1)
LYMPH%: 18.5 % (ref 14.0–48.0)
MCV: 96.2 fL (ref 81.6–98.0)
MONO#: 0.4 10*3/uL (ref 0.1–0.9)
MONO%: 9.3 % (ref 0.0–13.0)
NEUT#: 2.6 10*3/uL (ref 1.5–6.5)
NEUT%: 68.7 % (ref 40.0–75.0)
Platelets: 240 10*3/uL (ref 145–400)
WBC: 3.8 10*3/uL — ABNORMAL LOW (ref 4.0–10.0)

## 2007-12-17 ENCOUNTER — Ambulatory Visit: Payer: Self-pay | Admitting: Oncology

## 2007-12-22 LAB — CBC WITH DIFFERENTIAL/PLATELET
BASO%: 0.7 % (ref 0.0–2.0)
Basophils Absolute: 0 10*3/uL (ref 0.0–0.1)
Eosinophils Absolute: 0.1 10*3/uL (ref 0.0–0.5)
HCT: 29.5 % — ABNORMAL LOW (ref 38.7–49.9)
HGB: 10.3 g/dL — ABNORMAL LOW (ref 13.0–17.1)
MONO#: 0.3 10*3/uL (ref 0.1–0.9)
NEUT#: 2.8 10*3/uL (ref 1.5–6.5)
NEUT%: 71.7 % (ref 40.0–75.0)
WBC: 4 10*3/uL (ref 4.0–10.0)
lymph#: 0.6 10*3/uL — ABNORMAL LOW (ref 0.9–3.3)

## 2007-12-22 LAB — COMPREHENSIVE METABOLIC PANEL
ALT: <8 U/L (ref 0–53)
BUN: 58 mg/dL — ABNORMAL HIGH (ref 6–23)
CO2: 22 mEq/L (ref 19–32)
Calcium: 8.5 mg/dL (ref 8.4–10.5)
Chloride: 99 mEq/L (ref 96–112)
Creatinine, Ser: 2.13 mg/dL — ABNORMAL HIGH (ref 0.40–1.50)

## 2007-12-22 LAB — LACTATE DEHYDROGENASE: LDH: 160 U/L (ref 94–250)

## 2007-12-30 LAB — CBC WITH DIFFERENTIAL/PLATELET
BASO%: 0.9 % (ref 0.0–2.0)
Basophils Absolute: 0 10*3/uL (ref 0.0–0.1)
EOS%: 2.4 % (ref 0.0–7.0)
HGB: 9.9 g/dL — ABNORMAL LOW (ref 13.0–17.1)
MCH: 33.9 pg — ABNORMAL HIGH (ref 28.0–33.4)
RDW: 15.3 % — ABNORMAL HIGH (ref 11.2–14.6)
lymph#: 0.8 10*3/uL — ABNORMAL LOW (ref 0.9–3.3)

## 2008-01-12 LAB — CBC WITH DIFFERENTIAL/PLATELET
BASO%: 1 % (ref 0.0–2.0)
EOS%: 1.8 % (ref 0.0–7.0)
MCH: 34.2 pg — ABNORMAL HIGH (ref 28.0–33.4)
MCHC: 34.2 g/dL (ref 32.0–35.9)
MCV: 100 fL — ABNORMAL HIGH (ref 81.6–98.0)
MONO%: 9.7 % (ref 0.0–13.0)
NEUT#: 2.9 10*3/uL (ref 1.5–6.5)
RBC: 3.11 10*6/uL — ABNORMAL LOW (ref 4.20–5.71)
RDW: 14.6 % (ref 11.2–14.6)

## 2008-01-29 ENCOUNTER — Ambulatory Visit: Payer: Self-pay | Admitting: Oncology

## 2008-02-02 LAB — CBC WITH DIFFERENTIAL/PLATELET
BASO%: 0.7 % (ref 0.0–2.0)
Eosinophils Absolute: 0.1 10*3/uL (ref 0.0–0.5)
MONO#: 0.4 10*3/uL (ref 0.1–0.9)
MONO%: 8.3 % (ref 0.0–13.0)
NEUT#: 3.4 10*3/uL (ref 1.5–6.5)
RBC: 3.32 10*6/uL — ABNORMAL LOW (ref 4.20–5.71)
RDW: 13.9 % (ref 11.2–14.6)
WBC: 4.6 10*3/uL (ref 4.0–10.0)

## 2008-02-23 LAB — CBC WITH DIFFERENTIAL/PLATELET
Basophils Absolute: 0 10*3/uL (ref 0.0–0.1)
EOS%: 2.1 % (ref 0.0–7.0)
HGB: 11 g/dL — ABNORMAL LOW (ref 13.0–17.1)
LYMPH%: 15.1 % (ref 14.0–48.0)
MCH: 33.8 pg — ABNORMAL HIGH (ref 28.0–33.4)
MCV: 99.4 fL — ABNORMAL HIGH (ref 81.6–98.0)
MONO%: 8.6 % (ref 0.0–13.0)
NEUT%: 73.6 % (ref 40.0–75.0)
RDW: 13.9 % (ref 11.2–14.6)

## 2008-03-09 ENCOUNTER — Emergency Department (HOSPITAL_COMMUNITY): Admission: EM | Admit: 2008-03-09 | Discharge: 2008-03-09 | Payer: Self-pay | Admitting: Emergency Medicine

## 2008-03-15 ENCOUNTER — Ambulatory Visit: Payer: Self-pay | Admitting: Oncology

## 2008-03-15 LAB — CBC WITH DIFFERENTIAL/PLATELET
Basophils Absolute: 0.1 10*3/uL (ref 0.0–0.1)
Eosinophils Absolute: 0.1 10*3/uL (ref 0.0–0.5)
HCT: 30.1 % — ABNORMAL LOW (ref 38.7–49.9)
HGB: 10.2 g/dL — ABNORMAL LOW (ref 13.0–17.1)
MCH: 33.4 pg (ref 28.0–33.4)
NEUT#: 3.1 10*3/uL (ref 1.5–6.5)
NEUT%: 70.5 % (ref 40.0–75.0)
RDW: 13.7 % (ref 11.2–14.6)
lymph#: 0.8 10*3/uL — ABNORMAL LOW (ref 0.9–3.3)

## 2008-03-23 LAB — CBC WITH DIFFERENTIAL/PLATELET
Basophils Absolute: 0 10*3/uL (ref 0.0–0.1)
EOS%: 3.5 % (ref 0.0–7.0)
Eosinophils Absolute: 0.2 10*3/uL (ref 0.0–0.5)
HCT: 32.2 % — ABNORMAL LOW (ref 38.7–49.9)
HGB: 11 g/dL — ABNORMAL LOW (ref 13.0–17.1)
LYMPH%: 19.1 % (ref 14.0–48.0)
MCH: 33.5 pg — ABNORMAL HIGH (ref 28.0–33.4)
MCV: 98.2 fL — ABNORMAL HIGH (ref 81.6–98.0)
MONO%: 8.5 % (ref 0.0–13.0)
NEUT#: 3.3 10*3/uL (ref 1.5–6.5)
NEUT%: 68.4 % (ref 40.0–75.0)
Platelets: 446 10*3/uL — ABNORMAL HIGH (ref 145–400)

## 2008-03-30 LAB — COMPREHENSIVE METABOLIC PANEL
Albumin: 4 g/dL (ref 3.5–5.2)
Alkaline Phosphatase: 84 U/L (ref 39–117)
BUN: 50 mg/dL — ABNORMAL HIGH (ref 6–23)
Creatinine, Ser: 2.06 mg/dL — ABNORMAL HIGH (ref 0.40–1.50)
Glucose, Bld: 98 mg/dL (ref 70–99)
Potassium: 5 mEq/L (ref 3.5–5.3)
Total Bilirubin: 0.4 mg/dL (ref 0.3–1.2)

## 2008-03-30 LAB — JAK2 GENOTYPR

## 2008-04-05 LAB — CBC WITH DIFFERENTIAL/PLATELET
BASO%: 1.1 % (ref 0.0–2.0)
Basophils Absolute: 0 10*3/uL (ref 0.0–0.1)
EOS%: 2.6 % (ref 0.0–7.0)
Eosinophils Absolute: 0.1 10*3/uL (ref 0.0–0.5)
HCT: 31.5 % — ABNORMAL LOW (ref 38.7–49.9)
HGB: 10.7 g/dL — ABNORMAL LOW (ref 13.0–17.1)
LYMPH%: 20.8 % (ref 14.0–48.0)
MCH: 33.4 pg (ref 28.0–33.4)
MCHC: 34.1 g/dL (ref 32.0–35.9)
MCV: 97.8 fL (ref 81.6–98.0)
MONO#: 0.3 10*3/uL (ref 0.1–0.9)
MONO%: 7 % (ref 0.0–13.0)
NEUT#: 2.9 10*3/uL (ref 1.5–6.5)
NEUT%: 68.5 % (ref 40.0–75.0)
Platelets: 143 10*3/uL — ABNORMAL LOW (ref 145–400)
RBC: 3.22 10*6/uL — ABNORMAL LOW (ref 4.20–5.71)
RDW: 13.6 % (ref 11.2–14.6)
WBC: 4.3 10*3/uL (ref 4.0–10.0)
lymph#: 0.9 10*3/uL (ref 0.9–3.3)

## 2008-04-14 ENCOUNTER — Ambulatory Visit: Payer: Self-pay | Admitting: *Deleted

## 2008-04-19 ENCOUNTER — Inpatient Hospital Stay (HOSPITAL_COMMUNITY): Admission: RE | Admit: 2008-04-19 | Discharge: 2008-04-21 | Payer: Self-pay | Admitting: Orthopedic Surgery

## 2008-04-19 HISTORY — PX: REPLACEMENT TOTAL KNEE: SUR1224

## 2008-04-21 ENCOUNTER — Ambulatory Visit: Payer: Self-pay | Admitting: Oncology

## 2008-05-03 ENCOUNTER — Ambulatory Visit: Payer: Self-pay | Admitting: Oncology

## 2008-05-03 ENCOUNTER — Encounter (HOSPITAL_COMMUNITY): Admission: RE | Admit: 2008-05-03 | Discharge: 2008-06-03 | Payer: Self-pay | Admitting: Oncology

## 2008-05-03 LAB — CBC WITH DIFFERENTIAL/PLATELET
BASO%: 1.5 % (ref 0.0–2.0)
Eosinophils Absolute: 0.1 10*3/uL (ref 0.0–0.5)
HCT: 19.8 % — ABNORMAL LOW (ref 38.7–49.9)
LYMPH%: 14.2 % (ref 14.0–48.0)
MCHC: 33.9 g/dL (ref 32.0–35.9)
MONO#: 0.5 10*3/uL (ref 0.1–0.9)
NEUT#: 4.6 10*3/uL (ref 1.5–6.5)
NEUT%: 74.2 % (ref 40.0–75.0)
Platelets: 275 10*3/uL (ref 145–400)
RBC: 2.05 10*6/uL — ABNORMAL LOW (ref 4.20–5.71)
WBC: 6.2 10*3/uL (ref 4.0–10.0)
lymph#: 0.9 10*3/uL (ref 0.9–3.3)

## 2008-05-04 LAB — FECAL OCCULT BLOOD, GUAIAC: Occult Blood: NEGATIVE

## 2008-05-04 LAB — TYPE & CROSSMATCH - CHCC

## 2008-05-17 LAB — CBC WITH DIFFERENTIAL/PLATELET
Basophils Absolute: 0 10*3/uL (ref 0.0–0.1)
Eosinophils Absolute: 0.1 10*3/uL (ref 0.0–0.5)
HCT: 25.3 % — ABNORMAL LOW (ref 38.7–49.9)
HGB: 8.7 g/dL — ABNORMAL LOW (ref 13.0–17.1)
MCH: 33.7 pg — ABNORMAL HIGH (ref 28.0–33.4)
MONO#: 0.4 10*3/uL (ref 0.1–0.9)
NEUT#: 3.6 10*3/uL (ref 1.5–6.5)
NEUT%: 73.1 % (ref 40.0–75.0)
WBC: 4.9 10*3/uL (ref 4.0–10.0)
lymph#: 0.7 10*3/uL — ABNORMAL LOW (ref 0.9–3.3)

## 2008-05-17 LAB — COMPREHENSIVE METABOLIC PANEL
Albumin: 4 g/dL (ref 3.5–5.2)
BUN: 62 mg/dL — ABNORMAL HIGH (ref 6–23)
CO2: 21 mEq/L (ref 19–32)
Calcium: 8.7 mg/dL (ref 8.4–10.5)
Chloride: 96 mEq/L (ref 96–112)
Creatinine, Ser: 2 mg/dL — ABNORMAL HIGH (ref 0.40–1.50)
Potassium: 4.7 mEq/L (ref 3.5–5.3)

## 2008-05-19 LAB — TYPE & CROSSMATCH - CHCC

## 2008-06-07 LAB — CBC WITH DIFFERENTIAL/PLATELET
BASO%: 1.1 % (ref 0.0–2.0)
HCT: 32.1 % — ABNORMAL LOW (ref 38.7–49.9)
LYMPH%: 13.6 % — ABNORMAL LOW (ref 14.0–48.0)
MCH: 33.8 pg — ABNORMAL HIGH (ref 28.0–33.4)
MCHC: 34.5 g/dL (ref 32.0–35.9)
MONO#: 0.6 10*3/uL (ref 0.1–0.9)
NEUT%: 68.1 % (ref 40.0–75.0)
Platelets: 128 10*3/uL — ABNORMAL LOW (ref 145–400)
WBC: 4.2 10*3/uL (ref 4.0–10.0)

## 2008-06-07 LAB — BASIC METABOLIC PANEL
Glucose, Bld: 97 mg/dL (ref 70–99)
Potassium: 4.5 mEq/L (ref 3.5–5.3)
Sodium: 131 mEq/L — ABNORMAL LOW (ref 135–145)

## 2008-06-24 ENCOUNTER — Ambulatory Visit: Payer: Self-pay | Admitting: Oncology

## 2008-07-19 LAB — CBC WITH DIFFERENTIAL/PLATELET
BASO%: 1.4 % (ref 0.0–2.0)
EOS%: 3.3 % (ref 0.0–7.0)
LYMPH%: 16.1 % (ref 14.0–48.0)
MCHC: 34.5 g/dL (ref 32.0–35.9)
MCV: 101.1 fL — ABNORMAL HIGH (ref 81.6–98.0)
MONO%: 12.4 % (ref 0.0–13.0)
Platelets: 199 10*3/uL (ref 145–400)
RBC: 2.7 10*6/uL — ABNORMAL LOW (ref 4.20–5.71)

## 2008-07-19 LAB — COMPREHENSIVE METABOLIC PANEL
ALT: 14 U/L (ref 0–53)
AST: 14 U/L (ref 0–37)
Alkaline Phosphatase: 51 U/L (ref 39–117)
Creatinine, Ser: 2.41 mg/dL — ABNORMAL HIGH (ref 0.40–1.50)
Total Bilirubin: 0.6 mg/dL (ref 0.3–1.2)

## 2008-07-19 LAB — LACTATE DEHYDROGENASE: LDH: 135 U/L (ref 94–250)

## 2008-08-09 ENCOUNTER — Ambulatory Visit: Payer: Self-pay | Admitting: Oncology

## 2008-08-09 LAB — CBC WITH DIFFERENTIAL/PLATELET
BASO%: 0.7 % (ref 0.0–2.0)
HCT: 28.7 % — ABNORMAL LOW (ref 38.4–49.9)
MCHC: 34.2 g/dL (ref 32.0–36.0)
MONO#: 0.5 10*3/uL (ref 0.1–0.9)
NEUT%: 73.7 % (ref 39.0–75.0)
RDW: 14.6 % (ref 11.0–14.6)
WBC: 4.8 10*3/uL (ref 4.0–10.3)
lymph#: 0.6 10*3/uL — ABNORMAL LOW (ref 0.9–3.3)

## 2008-08-30 LAB — CBC WITH DIFFERENTIAL/PLATELET
Basophils Absolute: 0.1 10*3/uL (ref 0.0–0.1)
EOS%: 3.9 % (ref 0.0–7.0)
Eosinophils Absolute: 0.1 10*3/uL (ref 0.0–0.5)
HGB: 10 g/dL — ABNORMAL LOW (ref 13.0–17.1)
LYMPH%: 23.9 % (ref 14.0–49.0)
MCH: 34.3 pg — ABNORMAL HIGH (ref 27.2–33.4)
MCV: 99.1 fL — ABNORMAL HIGH (ref 79.3–98.0)
MONO%: 13 % (ref 0.0–14.0)
NEUT#: 2.1 10*3/uL (ref 1.5–6.5)
Platelets: 188 10*3/uL (ref 140–400)
RBC: 2.92 10*6/uL — ABNORMAL LOW (ref 4.20–5.82)

## 2008-09-20 LAB — CBC WITH DIFFERENTIAL/PLATELET
BASO%: 0.9 % (ref 0.0–2.0)
EOS%: 3.1 % (ref 0.0–7.0)
LYMPH%: 21.4 % (ref 14.0–49.0)
MCHC: 34.5 g/dL (ref 32.0–36.0)
MONO#: 0.4 10*3/uL (ref 0.1–0.9)
Platelets: 126 10*3/uL — ABNORMAL LOW (ref 140–400)
RBC: 2.74 10*6/uL — ABNORMAL LOW (ref 4.20–5.82)
WBC: 3.4 10*3/uL — ABNORMAL LOW (ref 4.0–10.3)
lymph#: 0.7 10*3/uL — ABNORMAL LOW (ref 0.9–3.3)

## 2008-10-07 ENCOUNTER — Ambulatory Visit: Payer: Self-pay | Admitting: Oncology

## 2008-10-11 LAB — CBC WITH DIFFERENTIAL/PLATELET
BASO%: 0.8 % (ref 0.0–2.0)
LYMPH%: 22.5 % (ref 14.0–49.0)
MCHC: 34.3 g/dL (ref 32.0–36.0)
MCV: 100.2 fL — ABNORMAL HIGH (ref 79.3–98.0)
MONO%: 12.4 % (ref 0.0–14.0)
Platelets: 194 10*3/uL (ref 140–400)
RBC: 2.96 10*6/uL — ABNORMAL LOW (ref 4.20–5.82)

## 2008-11-02 LAB — CBC WITH DIFFERENTIAL/PLATELET
Basophils Absolute: 0 10*3/uL (ref 0.0–0.1)
EOS%: 2.4 % (ref 0.0–7.0)
HCT: 28.9 % — ABNORMAL LOW (ref 38.4–49.9)
HGB: 10.1 g/dL — ABNORMAL LOW (ref 13.0–17.1)
LYMPH%: 20.6 % (ref 14.0–49.0)
MCH: 34.8 pg — ABNORMAL HIGH (ref 27.2–33.4)
MCV: 99.8 fL — ABNORMAL HIGH (ref 79.3–98.0)
MONO%: 8.2 % (ref 0.0–14.0)
NEUT%: 68.3 % (ref 39.0–75.0)
Platelets: 194 10*3/uL (ref 140–400)

## 2008-11-18 ENCOUNTER — Ambulatory Visit: Payer: Self-pay | Admitting: Oncology

## 2008-11-22 LAB — CBC WITH DIFFERENTIAL/PLATELET
EOS%: 3.1 % (ref 0.0–7.0)
Eosinophils Absolute: 0.1 10*3/uL (ref 0.0–0.5)
MCV: 99.6 fL — ABNORMAL HIGH (ref 79.3–98.0)
MONO%: 8.2 % (ref 0.0–14.0)
NEUT#: 2.1 10*3/uL (ref 1.5–6.5)
RBC: 3 10*6/uL — ABNORMAL LOW (ref 4.20–5.82)
RDW: 14.8 % — ABNORMAL HIGH (ref 11.0–14.6)

## 2008-11-22 LAB — COMPREHENSIVE METABOLIC PANEL
ALT: 11 U/L (ref 0–53)
AST: 18 U/L (ref 0–37)
Albumin: 3.6 g/dL (ref 3.5–5.2)
Alkaline Phosphatase: 61 U/L (ref 39–117)
Potassium: 4.2 mEq/L (ref 3.5–5.3)
Sodium: 135 mEq/L (ref 135–145)
Total Protein: 6.2 g/dL (ref 6.0–8.3)

## 2008-12-29 ENCOUNTER — Ambulatory Visit: Payer: Self-pay | Admitting: Oncology

## 2009-01-03 LAB — CBC WITH DIFFERENTIAL/PLATELET
EOS%: 3.1 % (ref 0.0–7.0)
Eosinophils Absolute: 0.1 10*3/uL (ref 0.0–0.5)
LYMPH%: 23 % (ref 14.0–49.0)
MCH: 35.1 pg — ABNORMAL HIGH (ref 27.2–33.4)
MCHC: 35.5 g/dL (ref 32.0–36.0)
MCV: 98.8 fL — ABNORMAL HIGH (ref 79.3–98.0)
MONO%: 7.9 % (ref 0.0–14.0)
NEUT#: 2.7 10*3/uL (ref 1.5–6.5)
Platelets: 166 10*3/uL (ref 140–400)
RBC: 2.43 10*6/uL — ABNORMAL LOW (ref 4.20–5.82)

## 2009-01-03 LAB — COMPREHENSIVE METABOLIC PANEL
AST: 11 U/L (ref 0–37)
Alkaline Phosphatase: 69 U/L (ref 39–117)
Glucose, Bld: 134 mg/dL — ABNORMAL HIGH (ref 70–99)
Sodium: 134 mEq/L — ABNORMAL LOW (ref 135–145)
Total Bilirubin: 0.3 mg/dL (ref 0.3–1.2)
Total Protein: 6.3 g/dL (ref 6.0–8.3)

## 2009-01-24 LAB — CBC WITH DIFFERENTIAL/PLATELET
Eosinophils Absolute: 0.1 10*3/uL (ref 0.0–0.5)
HCT: 27.6 % — ABNORMAL LOW (ref 38.4–49.9)
LYMPH%: 14.1 % (ref 14.0–49.0)
MONO#: 0.2 10*3/uL (ref 0.1–0.9)
NEUT#: 3.1 10*3/uL (ref 1.5–6.5)
NEUT%: 78.3 % — ABNORMAL HIGH (ref 39.0–75.0)
Platelets: 115 10*3/uL — ABNORMAL LOW (ref 140–400)
WBC: 4 10*3/uL (ref 4.0–10.3)

## 2009-02-10 ENCOUNTER — Ambulatory Visit: Payer: Self-pay | Admitting: Oncology

## 2009-02-14 LAB — CBC WITH DIFFERENTIAL/PLATELET
BASO%: 1.2 % (ref 0.0–2.0)
EOS%: 3.3 % (ref 0.0–7.0)
HCT: 27.6 % — ABNORMAL LOW (ref 38.4–49.9)
LYMPH%: 22.7 % (ref 14.0–49.0)
MCH: 35.1 pg — ABNORMAL HIGH (ref 27.2–33.4)
MCHC: 34.8 g/dL (ref 32.0–36.0)
MCV: 100.9 fL — ABNORMAL HIGH (ref 79.3–98.0)
NEUT%: 63.8 % (ref 39.0–75.0)
Platelets: 116 10*3/uL — ABNORMAL LOW (ref 140–400)

## 2009-02-16 ENCOUNTER — Ambulatory Visit: Payer: Self-pay | Admitting: Vascular Surgery

## 2009-03-07 LAB — CBC WITH DIFFERENTIAL/PLATELET
Eosinophils Absolute: 0.1 10*3/uL (ref 0.0–0.5)
LYMPH%: 21.6 % (ref 14.0–49.0)
MCH: 34.8 pg — ABNORMAL HIGH (ref 27.2–33.4)
MCHC: 34.4 g/dL (ref 32.0–36.0)
MCV: 101.1 fL — ABNORMAL HIGH (ref 79.3–98.0)
MONO%: 8.2 % (ref 0.0–14.0)
NEUT#: 2.5 10*3/uL (ref 1.5–6.5)
Platelets: 127 10*3/uL — ABNORMAL LOW (ref 140–400)
RBC: 3.12 10*6/uL — ABNORMAL LOW (ref 4.20–5.82)

## 2009-03-07 LAB — COMPREHENSIVE METABOLIC PANEL
Alkaline Phosphatase: 83 U/L (ref 39–117)
Glucose, Bld: 126 mg/dL — ABNORMAL HIGH (ref 70–99)
Sodium: 135 mEq/L (ref 135–145)
Total Bilirubin: 0.6 mg/dL (ref 0.3–1.2)
Total Protein: 6.8 g/dL (ref 6.0–8.3)

## 2009-03-24 ENCOUNTER — Ambulatory Visit: Payer: Self-pay | Admitting: Oncology

## 2009-03-28 LAB — CBC WITH DIFFERENTIAL/PLATELET
Eosinophils Absolute: 0.1 10*3/uL (ref 0.0–0.5)
HCT: 33 % — ABNORMAL LOW (ref 38.4–49.9)
LYMPH%: 20.9 % (ref 14.0–49.0)
MCHC: 34.4 g/dL (ref 32.0–36.0)
MONO#: 0.5 10*3/uL (ref 0.1–0.9)
NEUT#: 2.9 10*3/uL (ref 1.5–6.5)
NEUT%: 65.5 % (ref 39.0–75.0)
Platelets: 140 10*3/uL (ref 140–400)
WBC: 4.4 10*3/uL (ref 4.0–10.3)

## 2009-04-18 LAB — CBC WITH DIFFERENTIAL/PLATELET
Basophils Absolute: 0 10*3/uL (ref 0.0–0.1)
Eosinophils Absolute: 0.2 10*3/uL (ref 0.0–0.5)
HCT: 29.4 % — ABNORMAL LOW (ref 38.4–49.9)
HGB: 10 g/dL — ABNORMAL LOW (ref 13.0–17.1)
LYMPH%: 20.4 % (ref 14.0–49.0)
MCV: 99.7 fL — ABNORMAL HIGH (ref 79.3–98.0)
MONO%: 7.6 % (ref 0.0–14.0)
NEUT#: 3.2 10*3/uL (ref 1.5–6.5)
NEUT%: 67.3 % (ref 39.0–75.0)
Platelets: 188 10*3/uL (ref 140–400)

## 2009-05-05 ENCOUNTER — Ambulatory Visit: Payer: Self-pay | Admitting: Oncology

## 2009-05-09 LAB — CBC WITH DIFFERENTIAL/PLATELET
BASO%: 1.4 % (ref 0.0–2.0)
Basophils Absolute: 0.1 10*3/uL (ref 0.0–0.1)
Eosinophils Absolute: 0.1 10*3/uL (ref 0.0–0.5)
HCT: 32.3 % — ABNORMAL LOW (ref 38.4–49.9)
HGB: 11.1 g/dL — ABNORMAL LOW (ref 13.0–17.1)
LYMPH%: 20.2 % (ref 14.0–49.0)
MCHC: 34.3 g/dL (ref 32.0–36.0)
MONO#: 0.3 10*3/uL (ref 0.1–0.9)
NEUT%: 67.4 % (ref 39.0–75.0)
Platelets: 201 10*3/uL (ref 140–400)
WBC: 4.1 10*3/uL (ref 4.0–10.3)

## 2009-05-09 LAB — COMPREHENSIVE METABOLIC PANEL
BUN: 92 mg/dL — ABNORMAL HIGH (ref 6–23)
CO2: 25 mEq/L (ref 19–32)
Calcium: 8.9 mg/dL (ref 8.4–10.5)
Chloride: 106 mEq/L (ref 96–112)
Creatinine, Ser: 2.4 mg/dL — ABNORMAL HIGH (ref 0.40–1.50)
Glucose, Bld: 106 mg/dL — ABNORMAL HIGH (ref 70–99)
Total Bilirubin: 0.7 mg/dL (ref 0.3–1.2)

## 2009-05-09 LAB — LACTATE DEHYDROGENASE: LDH: 131 U/L (ref 94–250)

## 2009-05-30 LAB — CBC WITH DIFFERENTIAL/PLATELET
Basophils Absolute: 0.1 10*3/uL (ref 0.0–0.1)
EOS%: 4 % (ref 0.0–7.0)
HCT: 28.8 % — ABNORMAL LOW (ref 38.4–49.9)
HGB: 9.4 g/dL — ABNORMAL LOW (ref 13.0–17.1)
MONO#: 0.4 10*3/uL (ref 0.1–0.9)
NEUT#: 3.1 10*3/uL (ref 1.5–6.5)
NEUT%: 66.1 % (ref 39.0–75.0)
RDW: 13.8 % (ref 11.0–14.6)
WBC: 4.7 10*3/uL (ref 4.0–10.3)
lymph#: 1 10*3/uL (ref 0.9–3.3)

## 2009-06-16 ENCOUNTER — Ambulatory Visit: Payer: Self-pay | Admitting: Oncology

## 2009-06-20 LAB — CBC WITH DIFFERENTIAL/PLATELET
Basophils Absolute: 0 10*3/uL (ref 0.0–0.1)
Eosinophils Absolute: 0.1 10*3/uL (ref 0.0–0.5)
LYMPH%: 18.3 % (ref 14.0–49.0)
MCV: 100.2 fL — ABNORMAL HIGH (ref 79.3–98.0)
MONO#: 0.4 10*3/uL (ref 0.1–0.9)
NEUT#: 3.1 10*3/uL (ref 1.5–6.5)
NEUT%: 69.2 % (ref 39.0–75.0)
Platelets: 168 10*3/uL (ref 140–400)
WBC: 4.5 10*3/uL (ref 4.0–10.3)
lymph#: 0.8 10*3/uL — ABNORMAL LOW (ref 0.9–3.3)

## 2009-06-26 ENCOUNTER — Emergency Department (HOSPITAL_COMMUNITY): Admission: EM | Admit: 2009-06-26 | Discharge: 2009-06-26 | Payer: Self-pay | Admitting: Emergency Medicine

## 2009-07-11 LAB — CBC WITH DIFFERENTIAL/PLATELET
BASO%: 1.5 % (ref 0.0–2.0)
Eosinophils Absolute: 0.2 10*3/uL (ref 0.0–0.5)
HGB: 9.2 g/dL — ABNORMAL LOW (ref 13.0–17.1)
MCV: 100.6 fL — ABNORMAL HIGH (ref 79.3–98.0)
NEUT#: 2.4 10*3/uL (ref 1.5–6.5)
Platelets: 190 10*3/uL (ref 140–400)
RBC: 2.67 10*6/uL — ABNORMAL LOW (ref 4.20–5.82)
RDW: 14.1 % (ref 11.0–14.6)

## 2009-07-27 ENCOUNTER — Ambulatory Visit: Payer: Self-pay | Admitting: Oncology

## 2009-08-01 LAB — COMPREHENSIVE METABOLIC PANEL
ALT: 10 U/L (ref 0–53)
AST: 14 U/L (ref 0–37)
Alkaline Phosphatase: 70 U/L (ref 39–117)
Calcium: 8.7 mg/dL (ref 8.4–10.5)
Chloride: 109 mEq/L (ref 96–112)
Creatinine, Ser: 2.28 mg/dL — ABNORMAL HIGH (ref 0.40–1.50)
Potassium: 4.4 mEq/L (ref 3.5–5.3)
Sodium: 137 mEq/L (ref 135–145)

## 2009-08-01 LAB — CBC WITH DIFFERENTIAL/PLATELET
Basophils Absolute: 0 10*3/uL (ref 0.0–0.1)
EOS%: 3.8 % (ref 0.0–7.0)
Eosinophils Absolute: 0.1 10*3/uL (ref 0.0–0.5)
HCT: 27.9 % — ABNORMAL LOW (ref 38.4–49.9)
MONO%: 10.9 % (ref 0.0–14.0)
NEUT%: 68.2 % (ref 39.0–75.0)
Platelets: 151 10*3/uL (ref 140–400)
RDW: 13.9 % (ref 11.0–14.6)
WBC: 3.6 10*3/uL — ABNORMAL LOW (ref 4.0–10.3)

## 2009-08-01 LAB — LACTATE DEHYDROGENASE: LDH: 158 U/L (ref 94–250)

## 2009-08-18 ENCOUNTER — Ambulatory Visit: Payer: Self-pay | Admitting: Oncology

## 2009-08-22 LAB — CBC WITH DIFFERENTIAL/PLATELET
Eosinophils Absolute: 0.1 10*3/uL (ref 0.0–0.5)
HGB: 9.9 g/dL — ABNORMAL LOW (ref 13.0–17.1)
MONO#: 0.5 10*3/uL (ref 0.1–0.9)
NEUT%: 61.1 % (ref 39.0–75.0)
RDW: 14.1 % (ref 11.0–14.6)
WBC: 3.6 10*3/uL — ABNORMAL LOW (ref 4.0–10.3)
lymph#: 0.8 10*3/uL — ABNORMAL LOW (ref 0.9–3.3)

## 2009-09-07 ENCOUNTER — Ambulatory Visit: Payer: Self-pay | Admitting: Vascular Surgery

## 2009-09-12 LAB — CBC WITH DIFFERENTIAL/PLATELET
BASO%: 0.7 % (ref 0.0–2.0)
Basophils Absolute: 0 10*3/uL (ref 0.0–0.1)
Eosinophils Absolute: 0.1 10*3/uL (ref 0.0–0.5)
HCT: 29.8 % — ABNORMAL LOW (ref 38.4–49.9)
HGB: 10 g/dL — ABNORMAL LOW (ref 13.0–17.1)
MCH: 33.4 pg (ref 27.2–33.4)
RBC: 3.01 10*6/uL — ABNORMAL LOW (ref 4.20–5.82)
WBC: 4.1 10*3/uL (ref 4.0–10.3)
lymph#: 0.9 10*3/uL (ref 0.9–3.3)

## 2009-09-30 ENCOUNTER — Ambulatory Visit: Payer: Self-pay | Admitting: Oncology

## 2009-10-03 LAB — CBC WITH DIFFERENTIAL/PLATELET
BASO%: 0.9 % (ref 0.0–2.0)
HCT: 32 % — ABNORMAL LOW (ref 38.4–49.9)
LYMPH%: 22.1 % (ref 14.0–49.0)
MCH: 33.1 pg (ref 27.2–33.4)
MCHC: 33.4 g/dL (ref 32.0–36.0)
MCV: 98.9 fL — ABNORMAL HIGH (ref 79.3–98.0)
MONO%: 9.9 % (ref 0.0–14.0)
NEUT%: 64.2 % (ref 39.0–75.0)
Platelets: 161 10*3/uL (ref 140–400)

## 2009-10-24 LAB — CBC WITH DIFFERENTIAL/PLATELET
MCH: 33.6 pg — ABNORMAL HIGH (ref 27.2–33.4)
MCV: 96.2 fL (ref 79.3–98.0)
MONO#: 0.4 10*3/uL (ref 0.1–0.9)
NEUT%: 66.3 % (ref 39.0–75.0)
RDW: 14.6 % (ref 11.0–14.6)
WBC: 4.2 10*3/uL (ref 4.0–10.3)
lymph#: 0.9 10*3/uL (ref 0.9–3.3)

## 2009-10-24 LAB — COMPREHENSIVE METABOLIC PANEL
AST: 14 U/L (ref 0–37)
BUN: 86 mg/dL — ABNORMAL HIGH (ref 6–23)
Chloride: 109 mEq/L (ref 96–112)
Creatinine, Ser: 2.47 mg/dL — ABNORMAL HIGH (ref 0.40–1.50)
Glucose, Bld: 109 mg/dL — ABNORMAL HIGH (ref 70–99)
Sodium: 136 mEq/L (ref 135–145)

## 2009-10-24 LAB — LACTATE DEHYDROGENASE: LDH: 140 U/L (ref 94–250)

## 2009-11-10 ENCOUNTER — Ambulatory Visit: Payer: Self-pay | Admitting: Oncology

## 2009-11-14 LAB — CBC WITH DIFFERENTIAL/PLATELET
Basophils Absolute: 0.1 10*3/uL (ref 0.0–0.1)
MCH: 34.5 pg — ABNORMAL HIGH (ref 27.2–33.4)
MCHC: 34.8 g/dL (ref 32.0–36.0)
MONO#: 0.4 10*3/uL (ref 0.1–0.9)
MONO%: 9.2 % (ref 0.0–14.0)
NEUT%: 64.7 % (ref 39.0–75.0)
RDW: 16.1 % — ABNORMAL HIGH (ref 11.0–14.6)
WBC: 4.2 10*3/uL (ref 4.0–10.3)
lymph#: 0.9 10*3/uL (ref 0.9–3.3)

## 2009-12-06 LAB — CBC WITH DIFFERENTIAL/PLATELET
HGB: 9.9 g/dL — ABNORMAL LOW (ref 13.0–17.1)
LYMPH%: 19.4 % (ref 14.0–49.0)
MCHC: 34.9 g/dL (ref 32.0–36.0)
MCV: 98.8 fL — ABNORMAL HIGH (ref 79.3–98.0)
MONO%: 8.9 % (ref 0.0–14.0)
NEUT#: 3.2 10*3/uL (ref 1.5–6.5)
NEUT%: 67.6 % (ref 39.0–75.0)
Platelets: 156 10*3/uL (ref 140–400)
WBC: 4.7 10*3/uL (ref 4.0–10.3)
lymph#: 0.9 10*3/uL (ref 0.9–3.3)

## 2009-12-12 ENCOUNTER — Ambulatory Visit: Payer: Self-pay | Admitting: Oncology

## 2009-12-26 LAB — CBC WITH DIFFERENTIAL/PLATELET
BASO%: 1.2 % (ref 0.0–2.0)
Eosinophils Absolute: 0.1 10*3/uL (ref 0.0–0.5)
MCHC: 33.5 g/dL (ref 32.0–36.0)
MCV: 101.6 fL — ABNORMAL HIGH (ref 79.3–98.0)
MONO%: 8.2 % (ref 0.0–14.0)
Platelets: 166 10*3/uL (ref 140–400)
RDW: 15.4 % — ABNORMAL HIGH (ref 11.0–14.6)
WBC: 3.5 10*3/uL — ABNORMAL LOW (ref 4.0–10.3)
lymph#: 0.9 10*3/uL (ref 0.9–3.3)

## 2009-12-26 LAB — IRON AND TIBC
%SAT: 38 % (ref 20–55)
TIBC: 246 ug/dL (ref 215–435)
UIBC: 152 ug/dL

## 2010-01-20 ENCOUNTER — Ambulatory Visit: Payer: Self-pay | Admitting: Oncology

## 2010-01-24 LAB — CBC WITH DIFFERENTIAL/PLATELET
Basophils Absolute: 0 10*3/uL (ref 0.0–0.1)
EOS%: 3.5 % (ref 0.0–7.0)
HCT: 25.2 % — ABNORMAL LOW (ref 38.4–49.9)
LYMPH%: 18.6 % (ref 14.0–49.0)
MCV: 100 fL — ABNORMAL HIGH (ref 79.3–98.0)
MONO%: 7.9 % (ref 0.0–14.0)
NEUT#: 3.3 10*3/uL (ref 1.5–6.5)
RDW: 14.2 % (ref 11.0–14.6)
WBC: 4.7 10*3/uL (ref 4.0–10.3)
lymph#: 0.9 10*3/uL (ref 0.9–3.3)

## 2010-01-25 LAB — COMPREHENSIVE METABOLIC PANEL
ALT: 8 U/L (ref 0–53)
AST: 12 U/L (ref 0–37)
Albumin: 4.1 g/dL (ref 3.5–5.2)
Alkaline Phosphatase: 65 U/L (ref 39–117)
BUN: 66 mg/dL — ABNORMAL HIGH (ref 6–23)
CO2: 18 mEq/L — ABNORMAL LOW (ref 19–32)
Calcium: 8.6 mg/dL (ref 8.4–10.5)
Chloride: 104 mEq/L (ref 96–112)
Creatinine, Ser: 2.17 mg/dL — ABNORMAL HIGH (ref 0.40–1.50)
Glucose, Bld: 123 mg/dL — ABNORMAL HIGH (ref 70–99)
Potassium: 4.9 mEq/L (ref 3.5–5.3)
Sodium: 136 mEq/L (ref 135–145)
Total Bilirubin: 0.3 mg/dL (ref 0.3–1.2)
Total Protein: 6.4 g/dL (ref 6.0–8.3)

## 2010-01-25 LAB — FERRITIN: Ferritin: 368 ng/mL — ABNORMAL HIGH (ref 22–322)

## 2010-01-25 LAB — IRON AND TIBC
Iron: 75 ug/dL (ref 42–165)
TIBC: 254 ug/dL (ref 215–435)

## 2010-01-25 LAB — TRANSFERRIN RECEPTOR, SOLUABLE: Transferrin Receptor, Soluble: 17.6 nmol/L

## 2010-02-13 LAB — CBC WITH DIFFERENTIAL/PLATELET
BASO%: 1.1 % (ref 0.0–2.0)
Basophils Absolute: 0 10e3/uL (ref 0.0–0.1)
EOS%: 3.8 % (ref 0.0–7.0)
Eosinophils Absolute: 0.1 10e3/uL (ref 0.0–0.5)
HCT: 26.3 % — ABNORMAL LOW (ref 38.4–49.9)
HGB: 9 g/dL — ABNORMAL LOW (ref 13.0–17.1)
LYMPH%: 19.5 % (ref 14.0–49.0)
MCH: 35 pg — ABNORMAL HIGH (ref 27.2–33.4)
MCHC: 34.4 g/dL (ref 32.0–36.0)
MCV: 102 fL — ABNORMAL HIGH (ref 79.3–98.0)
MONO#: 0.3 10e3/uL (ref 0.1–0.9)
MONO%: 7.9 % (ref 0.0–14.0)
NEUT#: 2.3 10e3/uL (ref 1.5–6.5)
NEUT%: 67.7 % (ref 39.0–75.0)
Platelets: 123 10e3/uL — ABNORMAL LOW (ref 140–400)
RBC: 2.58 10e6/uL — ABNORMAL LOW (ref 4.20–5.82)
RDW: 15.4 % — ABNORMAL HIGH (ref 11.0–14.6)
WBC: 3.5 10e3/uL — ABNORMAL LOW (ref 4.0–10.3)
lymph#: 0.7 10e3/uL — ABNORMAL LOW (ref 0.9–3.3)

## 2010-03-02 ENCOUNTER — Ambulatory Visit: Payer: Self-pay | Admitting: Oncology

## 2010-03-06 LAB — CBC WITH DIFFERENTIAL/PLATELET
BASO%: 0.8 % (ref 0.0–2.0)
LYMPH%: 22.3 % (ref 14.0–49.0)
MCH: 35.7 pg — ABNORMAL HIGH (ref 27.2–33.4)
MCHC: 35 g/dL (ref 32.0–36.0)
NEUT%: 63.8 % (ref 39.0–75.0)
RBC: 2.97 10*6/uL — ABNORMAL LOW (ref 4.20–5.82)
RDW: 15.3 % — ABNORMAL HIGH (ref 11.0–14.6)
lymph#: 0.9 10*3/uL (ref 0.9–3.3)

## 2010-03-27 LAB — CBC WITH DIFFERENTIAL/PLATELET
BASO%: 0.8 % (ref 0.0–2.0)
EOS%: 3.5 % (ref 0.0–7.0)
HCT: 26.4 % — ABNORMAL LOW (ref 38.4–49.9)
HGB: 9.2 g/dL — ABNORMAL LOW (ref 13.0–17.1)
LYMPH%: 19 % (ref 14.0–49.0)
MCH: 35.2 pg — ABNORMAL HIGH (ref 27.2–33.4)
MCHC: 34.9 g/dL (ref 32.0–36.0)
MONO%: 6.9 % (ref 0.0–14.0)
NEUT#: 3.4 10*3/uL (ref 1.5–6.5)
Platelets: 172 10*3/uL (ref 140–400)

## 2010-04-13 ENCOUNTER — Ambulatory Visit: Payer: Self-pay | Admitting: Oncology

## 2010-04-17 LAB — CBC WITH DIFFERENTIAL/PLATELET
BASO%: 1.1 % (ref 0.0–2.0)
EOS%: 4.3 % (ref 0.0–7.0)
Eosinophils Absolute: 0.2 10*3/uL (ref 0.0–0.5)
HCT: 28.8 % — ABNORMAL LOW (ref 38.4–49.9)
HGB: 9.8 g/dL — ABNORMAL LOW (ref 13.0–17.1)
MCH: 34.5 pg — ABNORMAL HIGH (ref 27.2–33.4)
MCHC: 34.2 g/dL (ref 32.0–36.0)
NEUT%: 64.3 % (ref 39.0–75.0)
Platelets: 134 10*3/uL — ABNORMAL LOW (ref 140–400)
RBC: 2.85 10*6/uL — ABNORMAL LOW (ref 4.20–5.82)

## 2010-04-17 LAB — COMPREHENSIVE METABOLIC PANEL
Alkaline Phosphatase: 74 U/L (ref 39–117)
Creatinine, Ser: 2.43 mg/dL — ABNORMAL HIGH (ref 0.40–1.50)
Glucose, Bld: 122 mg/dL — ABNORMAL HIGH (ref 70–99)
Sodium: 140 mEq/L (ref 135–145)
Total Bilirubin: 0.5 mg/dL (ref 0.3–1.2)
Total Protein: 6.7 g/dL (ref 6.0–8.3)

## 2010-04-17 LAB — LACTATE DEHYDROGENASE: LDH: 128 U/L (ref 94–250)

## 2010-04-19 LAB — IRON AND TIBC
%SAT: 24 % (ref 20–55)
Iron: 66 ug/dL (ref 42–165)
TIBC: 270 ug/dL (ref 215–435)
UIBC: 204 ug/dL

## 2010-04-19 LAB — FERRITIN: Ferritin: 180 ng/mL (ref 22–322)

## 2010-05-08 LAB — CBC WITH DIFFERENTIAL/PLATELET
Basophils Absolute: 0 10*3/uL (ref 0.0–0.1)
MCH: 34.3 pg — ABNORMAL HIGH (ref 27.2–33.4)
MCHC: 34.2 g/dL (ref 32.0–36.0)
MONO%: 8.3 % (ref 0.0–14.0)
Platelets: 141 10*3/uL (ref 140–400)
RBC: 3.1 10*6/uL — ABNORMAL LOW (ref 4.20–5.82)
lymph#: 0.9 10*3/uL (ref 0.9–3.3)

## 2010-05-25 ENCOUNTER — Ambulatory Visit: Payer: Self-pay | Admitting: Oncology

## 2010-05-30 LAB — CBC WITH DIFFERENTIAL/PLATELET
BASO%: 0.7 % (ref 0.0–2.0)
Basophils Absolute: 0 10*3/uL (ref 0.0–0.1)
Eosinophils Absolute: 0.1 10*3/uL (ref 0.0–0.5)
MCH: 34.2 pg — ABNORMAL HIGH (ref 27.2–33.4)
MCHC: 34.9 g/dL (ref 32.0–36.0)
MONO#: 0.4 10*3/uL (ref 0.1–0.9)
NEUT#: 3.4 10*3/uL (ref 1.5–6.5)
RBC: 2.74 10*6/uL — ABNORMAL LOW (ref 4.20–5.82)
RDW: 13.4 % (ref 11.0–14.6)
WBC: 4.9 10*3/uL (ref 4.0–10.3)
lymph#: 1 10*3/uL (ref 0.9–3.3)

## 2010-06-20 LAB — CBC WITH DIFFERENTIAL/PLATELET
BASO%: 1 % (ref 0.0–2.0)
Basophils Absolute: 0 10*3/uL (ref 0.0–0.1)
EOS%: 2.6 % (ref 0.0–7.0)
Eosinophils Absolute: 0.1 10*3/uL (ref 0.0–0.5)
HCT: 30.1 % — ABNORMAL LOW (ref 38.4–49.9)
HGB: 10.1 g/dL — ABNORMAL LOW (ref 13.0–17.1)
LYMPH%: 19.9 % (ref 14.0–49.0)
MCH: 33.4 pg (ref 27.2–33.4)
MCHC: 33.6 g/dL (ref 32.0–36.0)
MCV: 99.6 fL — ABNORMAL HIGH (ref 79.3–98.0)
MONO#: 0.4 10*3/uL (ref 0.1–0.9)
MONO%: 8.3 % (ref 0.0–14.0)
NEUT#: 3.1 10*3/uL (ref 1.5–6.5)
NEUT%: 68.2 % (ref 39.0–75.0)
Platelets: 169 10*3/uL (ref 140–400)
RBC: 3.03 10*6/uL — ABNORMAL LOW (ref 4.20–5.82)
RDW: 14.3 % (ref 11.0–14.6)
WBC: 4.6 10*3/uL (ref 4.0–10.3)
lymph#: 0.9 10*3/uL (ref 0.9–3.3)

## 2010-06-25 ENCOUNTER — Encounter: Payer: Self-pay | Admitting: Cardiothoracic Surgery

## 2010-06-26 ENCOUNTER — Ambulatory Visit: Payer: Self-pay | Admitting: Cardiology

## 2010-07-11 ENCOUNTER — Encounter (HOSPITAL_BASED_OUTPATIENT_CLINIC_OR_DEPARTMENT_OTHER): Payer: Medicare Other | Admitting: Oncology

## 2010-07-11 ENCOUNTER — Other Ambulatory Visit (HOSPITAL_COMMUNITY): Payer: Self-pay | Admitting: Oncology

## 2010-07-11 DIAGNOSIS — D473 Essential (hemorrhagic) thrombocythemia: Secondary | ICD-10-CM

## 2010-07-11 DIAGNOSIS — N289 Disorder of kidney and ureter, unspecified: Secondary | ICD-10-CM

## 2010-07-11 DIAGNOSIS — D631 Anemia in chronic kidney disease: Secondary | ICD-10-CM

## 2010-07-11 DIAGNOSIS — N189 Chronic kidney disease, unspecified: Secondary | ICD-10-CM

## 2010-07-11 LAB — CBC WITH DIFFERENTIAL/PLATELET
Eosinophils Absolute: 0.2 10*3/uL (ref 0.0–0.5)
HGB: 8.9 g/dL — ABNORMAL LOW (ref 13.0–17.1)
LYMPH%: 16 % (ref 14.0–49.0)
MONO#: 0.4 10*3/uL (ref 0.1–0.9)
NEUT#: 3.7 10*3/uL (ref 1.5–6.5)
NEUT%: 71.8 % (ref 39.0–75.0)
RBC: 2.72 10*6/uL — ABNORMAL LOW (ref 4.20–5.82)
lymph#: 0.8 10*3/uL — ABNORMAL LOW (ref 0.9–3.3)

## 2010-07-24 ENCOUNTER — Ambulatory Visit (INDEPENDENT_AMBULATORY_CARE_PROVIDER_SITE_OTHER): Payer: Medicare Other | Admitting: Gastroenterology

## 2010-07-24 ENCOUNTER — Encounter: Payer: Self-pay | Admitting: Gastroenterology

## 2010-07-24 DIAGNOSIS — K219 Gastro-esophageal reflux disease without esophagitis: Secondary | ICD-10-CM

## 2010-07-24 DIAGNOSIS — K222 Esophageal obstruction: Secondary | ICD-10-CM | POA: Insufficient documentation

## 2010-07-24 DIAGNOSIS — Z951 Presence of aortocoronary bypass graft: Secondary | ICD-10-CM | POA: Insufficient documentation

## 2010-07-26 NOTE — Procedures (Signed)
Summary: Colonoscopy   Colonoscopy  Procedure date:  10/24/2004  Findings:      Results: Hemorrhoids.     Results: Colitis.       Location:  [facility].   Patient Name: [name], [name] MRN: [id number] Procedure Procedures: Incomplete Colonoscopy, unable to go beyond splenic flexure. CPT: M2924229.  Personnel: Endoscopist: Barbette Hair. Arlyce Dice, MD.  Patient Consent: Procedure, Alternatives, Risks and Benefits discussed, consent obtained, from patient.  Indications Symptoms: Diarrhea  History  Current Medications: Patient is not currently taking Coumadin.  Medical/ Surgical History: Dysphagia, h/o esophageal stricture.  Pre-Exam Physical: Performed Oct 24, 2004. Cardio-pulmonary exam, HEENT exam , Abdominal exam WNL.  Exam Exam: Extent of exam reached: Descending Colon, extent intended: Descending Colon.  Exam incomplete due to Exam purposely limited to the left colon. The cecum was identified by IC valve. Colon retroflexion performed. ASA Classification: II. Tolerance: good.  Monitoring: Pulse and BP monitoring, Oximetry used. Supplemental O2 given. at 2 Liters.  Sedation Meds: Patient assessed and found to be appropriate for moderate (conscious) sedation. Sedation was managed by the Endoscopist. Fentanyl 75 mcg. given IV. Versed 7 mg. given IV.  Findings - MUCOSAL ABNORMALITY: Descending Colon to Sigmoid Colon. Erythema present. Edema present. Biopsy/Mucosal Abn. taken. ICD9: Colitis, Unspecified: 558.9. Comments: Mild inflammatory changes beginning in proximal sigmoid and extending proximally.  - NORMAL EXAM: Sigmoid Colon to Rectum.  HEMORRHOIDS: Internal. ICD9: Hemorrhoids, Internal: 455.0.    Comments: Stool sample sent for C. dificile toxin Assessment Abnormal examination, see findings above.  Diagnoses: 558.9: Colitis, Unspecified.  455.0: Hemorrhoids, Internal.   Events  Unplanned Interventions: No intervention was required.  Unplanned  Events: There were no complications. Plans  Post Exam Instructions: Post sedation instructions given.  Medication Plan: Continue current medications. Anti-diarrheal: Cholestyramine 1 QID, starting Oct 24, 2004   Patient Education: Patient given standard instructions for: Hemorrhoids.  Scheduling/Referral: Await pathology to schedule patient. Office Visit, to [employer]. Arlyce Dice, MD, around Oct 27, 2004.    This report was created from the original endoscopy report, which was reviewed and signed by the above listed endoscopist.

## 2010-07-26 NOTE — Procedures (Signed)
Summary: Colonoscopy   Colonoscopy  Procedure date:  12/14/2002  Findings:      Results: Hemorrhoids.     Results: Diverticulosis.       Location:  [facility].    Comments:      Repeat colonoscopy in 5 years.  Patient Name: [name], [name] MRN: [id number] Procedure Procedures: Colorectal cancer screening, average risk CPT: G0121.  Personnel: Endoscopist: Barbette Hair. Arlyce Dice, MD.  Indications  Average Risk Screening Routine.  History  Medical/ Surgical History: Dysphagia, h/o esophageal stricture.  Pre-Exam Physical: Performed Dec 14, 2002. Entire physical exam was normal.  Exam Exam: Extent of exam reached: Cecum, extent intended: Cecum.  The cecum was identified by IC valve. Colon retroflexion performed. ASA Classification: II. Tolerance: good.  Monitoring: Pulse and BP monitoring, Oximetry used. Supplemental O2 given. at 2 Liters.  Colon Prep Used Golytely for colon prep. Prep results: good.  Sedation Meds: Fentanyl 100 mcg. given IV. Versed 10 mg. given IV.  Findings - DIVERTICULOSIS: Splenic Flexure to Sigmoid Colon. ICD9: Diverticulosis: 562.10. Comments: Multiple wide-mouthed tics, especially is left colon.  NORMAL EXAM: Cecum.  HEMORRHOIDS: Internal. ICD9: Hemorrhoids, Internal: 455.0.   Assessment  Diagnoses: 562.10: Diverticulosis.  455.0: Hemorrhoids, Internal.   Events  Unplanned Interventions: No intervention was required.  Unplanned Events: There were no complications. Plans Patient Education: Patient given standard instructions for: Hemorrhoids.  Scheduling/Referral: Colonoscopy, to Barbette Hair. Arlyce Dice, MD, around Dec 14, 2007.    This report was created from the original endoscopy report, which was reviewed and signed by the above listed endoscopist.

## 2010-08-01 ENCOUNTER — Encounter (HOSPITAL_BASED_OUTPATIENT_CLINIC_OR_DEPARTMENT_OTHER): Payer: Medicare Other | Admitting: Oncology

## 2010-08-01 ENCOUNTER — Other Ambulatory Visit (HOSPITAL_COMMUNITY): Payer: Self-pay | Admitting: Oncology

## 2010-08-01 DIAGNOSIS — D473 Essential (hemorrhagic) thrombocythemia: Secondary | ICD-10-CM

## 2010-08-01 DIAGNOSIS — D649 Anemia, unspecified: Secondary | ICD-10-CM

## 2010-08-01 DIAGNOSIS — N289 Disorder of kidney and ureter, unspecified: Secondary | ICD-10-CM

## 2010-08-01 LAB — COMPREHENSIVE METABOLIC PANEL
AST: 15 U/L (ref 0–37)
Albumin: 3.5 g/dL (ref 3.5–5.2)
Alkaline Phosphatase: 79 U/L (ref 39–117)
Glucose, Bld: 145 mg/dL — ABNORMAL HIGH (ref 70–99)
Potassium: 4.7 mEq/L (ref 3.5–5.3)
Sodium: 135 mEq/L (ref 135–145)
Total Bilirubin: 0.5 mg/dL (ref 0.3–1.2)
Total Protein: 6.5 g/dL (ref 6.0–8.3)

## 2010-08-01 LAB — CBC WITH DIFFERENTIAL/PLATELET
EOS%: 2.6 % (ref 0.0–7.0)
LYMPH%: 15 % (ref 14.0–49.0)
MCH: 33.4 pg (ref 27.2–33.4)
MCHC: 34.3 g/dL (ref 32.0–36.0)
MCV: 97.4 fL (ref 79.3–98.0)
MONO%: 6.1 % (ref 0.0–14.0)
Platelets: 141 10*3/uL (ref 140–400)
RBC: 2.84 10*6/uL — ABNORMAL LOW (ref 4.20–5.82)
RDW: 14.3 % (ref 11.0–14.6)

## 2010-08-01 LAB — LACTATE DEHYDROGENASE: LDH: 142 U/L (ref 94–250)

## 2010-08-01 NOTE — Assessment & Plan Note (Signed)
Summary: follow up     History of Present Illness Visit Type: Follow-up Visit Primary GI MD: Melvia Heaps MD [facility] Primary Provider: Jacalyn Lefevre, MD  Requesting Provider: na Chief Complaint: F/u for GERD. Pt denies any GI complaints. Pt needs refill on Pantoprazole  History of Present Illness:   [name]  is a 77 year old white male with a history of an esophageal stricture and GERD who is here for followup of his reflux.On daily protonix he is feeling well. He has no GI complaints including dysphagia , pyrosis or abdominal pain.  He complains of mild constipation but manages to move his bowels daily.  He cannot take fiber because high potassium.   GI Review of Systems      Denies abdominal pain, acid reflux, belching, bloating, chest pain, dysphagia with liquids, dysphagia with solids, heartburn, loss of appetite, nausea, vomiting, vomiting blood, weight loss, and  weight gain.        Denies anal fissure, black tarry stools, change in bowel habit, constipation, diarrhea, diverticulosis, fecal incontinence, heme positive stool, hemorrhoids, irritable bowel syndrome, jaundice, light color stool, liver problems, rectal bleeding, and  rectal pain.    Current Medications (verified): 1)  Pantoprazole Sodium 40 Mg Tbec (Pantoprazole Sodium) .Marland Kitchen.. 1 Tablet By Mouth Once Daily 2)  Toprol Xl 100 Mg Xr24h-Tab (Metoprolol Succinate) .Marland Kitchen.. 1 By Mouth Two Times A Day 3)  Furosemide 40 Mg Tabs (Furosemide) .Marland Kitchen.. 1 and 1/2 Tablet  By Mouth Once Daily 4)  Aspirin 81 Mg Tbec (Aspirin) .... One By Mouth Once Daily 5)  Metamucil 30.9 % Powd (Psyllium) .... Every Other Day 6)  Flomax 0.4 Mg Caps (Tamsulosin Hcl) .... One Tablet By Mouth Once Daily 7)  Catapres 0.3 Mg Tabs (Clonidine Hcl) .... One Tablet By Mouth Three Times A Day  Allergies (verified): No Known Drug Allergies  Past History:  Past Medical History: Esophageal Stricture Peptic Ulcer Disease GERD 562.10: Diverticulosis.  455.0:  Hemorrhoids, Internal Colitis  Hypertension Arthritis  Past Surgical History: Coronary artery bypass graft Right Knee Replacement  Family History: No FH of Colon Cancer:  Social History: Retired  Patient is a former smoker:  quit 25 yrs ago  Alcohol Use - no Illicit Drug Use - no Daily Caffeine Use: coffee daily  Smoking Status:  quit Drug Use:  no  Review of Systems       The patient complains of arthritis/joint pain.  The patient denies allergy/sinus, anemia, anxiety-new, back pain, blood in urine, breast changes/lumps, change in vision, confusion, cough, coughing up blood, depression-new, fainting, fatigue, fever, headaches-new, hearing problems, heart murmur, heart rhythm changes, itching, menstrual pain, muscle pains/cramps, night sweats, nosebleeds, pregnancy symptoms, shortness of breath, skin rash, sleeping problems, sore throat, swelling of feet/legs, swollen lymph glands, thirst - excessive, urination - excessive, urination changes/pain, urine leakage, vision changes, and voice change.         All other systems were reviewed and were negative   Vital Signs:  Patient profile:   77 year old male Height:      70 inches Weight:      165 pounds BMI:     23.76 BSA:     1.93 Pulse rate:   60 / minute Pulse rhythm:   regular BP sitting:   124 / 64  (left arm) Cuff size:   regular  Vitals Entered By: Ok Anis CMA (July 24, 2010 9:23 AM)  Physical Exam  Additional Exam:   On physical exam he's a well-developed well-nourished  male  Physical Exam: General:   WDWN HEENT:   anicteric.  No pharyngeal abnormalities Neck:   No masses, thyroidmegaly Nodes:   No cervical, axillary, inguinal adenopathy Chest:    Clear to auscultation Cardiac:   No murmurs, gallops, rubs Abdomen:   BS active.  No abd masses, tenderness, organomegaly Rectal:   No masses.  Stool hemeoccut negative Extremities:   No cyanosis, clubbing, edema Skeletal:   No  deformities Neuro:   Alert, oriented x3.  No focal abnormalitie     Impression & Recommendations:  Problem # 1:  ESOPHAGEAL REFLUX (ICD-530.81)  He remains asymptomatic with daily Protonix. Plan to continue with the same.  Problem # 2:  STRICTURE AND STENOSIS OF ESOPHAGUS (ICD-530.3)  Plan repeat dilatation when necessary  Problem # 3:  CAD (ICD-414.00) Assessment: Comment Only  Patient Instructions: 1)  Your prescription for pantoprazole has been sent to your pharmacy.  2)  Please schedule a follow-up appointment in 1 year. 3)  Copy sent to : Jacalyn Lefevre, MD 4)  The medication list was reviewed and reconciled.  All changed / newly prescribed medications were explained.  A complete medication list was provided to the patient / caregiver. Prescriptions: PANTOPRAZOLE SODIUM 40 MG TBEC (PANTOPRAZOLE SODIUM) 1 tablet by mouth once daily  #30 Tablet x 12   Entered and Authorized by:   Louis Meckel MD   Signed by:   Louis Meckel MD on 07/24/2010   Method used:   Electronically to        [facility] [phone]* (retail)       [address]       [place], [place]  [address]       Ph: [phone] or [phone]       Fax: [phone]   RxID:   [id number]

## 2010-08-05 LAB — FERRITIN: Ferritin: 154 ng/mL (ref 22–322)

## 2010-08-05 LAB — IRON AND TIBC: Iron: 66 ug/dL (ref 42–165)

## 2010-08-21 ENCOUNTER — Other Ambulatory Visit (HOSPITAL_COMMUNITY): Payer: Self-pay | Admitting: Oncology

## 2010-08-21 ENCOUNTER — Encounter (HOSPITAL_BASED_OUTPATIENT_CLINIC_OR_DEPARTMENT_OTHER): Payer: Medicare Other | Admitting: Oncology

## 2010-08-21 DIAGNOSIS — D649 Anemia, unspecified: Secondary | ICD-10-CM

## 2010-08-21 DIAGNOSIS — D473 Essential (hemorrhagic) thrombocythemia: Secondary | ICD-10-CM

## 2010-08-21 DIAGNOSIS — N289 Disorder of kidney and ureter, unspecified: Secondary | ICD-10-CM

## 2010-08-21 LAB — CBC WITH DIFFERENTIAL/PLATELET
Basophils Absolute: 0 10*3/uL (ref 0.0–0.1)
Eosinophils Absolute: 0.2 10*3/uL (ref 0.0–0.5)
HGB: 9.1 g/dL — ABNORMAL LOW (ref 13.0–17.1)
MCV: 96.3 fL (ref 79.3–98.0)
MONO%: 8.3 % (ref 0.0–14.0)
NEUT#: 2.2 10*3/uL (ref 1.5–6.5)
Platelets: 81 10*3/uL — ABNORMAL LOW (ref 140–400)
RDW: 13.6 % (ref 11.0–14.6)

## 2010-09-11 ENCOUNTER — Other Ambulatory Visit (HOSPITAL_COMMUNITY): Payer: Self-pay | Admitting: Oncology

## 2010-09-11 ENCOUNTER — Encounter (HOSPITAL_BASED_OUTPATIENT_CLINIC_OR_DEPARTMENT_OTHER): Payer: Medicare Other | Admitting: Oncology

## 2010-09-11 DIAGNOSIS — D473 Essential (hemorrhagic) thrombocythemia: Secondary | ICD-10-CM

## 2010-09-11 DIAGNOSIS — N289 Disorder of kidney and ureter, unspecified: Secondary | ICD-10-CM

## 2010-09-11 DIAGNOSIS — D649 Anemia, unspecified: Secondary | ICD-10-CM

## 2010-09-11 LAB — CBC WITH DIFFERENTIAL/PLATELET
Basophils Absolute: 0 10*3/uL (ref 0.0–0.1)
Eosinophils Absolute: 0.1 10*3/uL (ref 0.0–0.5)
LYMPH%: 22.8 % (ref 14.0–49.0)
MCV: 95.4 fL (ref 79.3–98.0)
MONO%: 7.6 % (ref 0.0–14.0)
NEUT#: 2.6 10*3/uL (ref 1.5–6.5)
NEUT%: 64.8 % (ref 39.0–75.0)
Platelets: 152 10*3/uL (ref 140–400)
RBC: 2.96 10*6/uL — ABNORMAL LOW (ref 4.20–5.82)

## 2010-10-02 ENCOUNTER — Other Ambulatory Visit (HOSPITAL_COMMUNITY): Payer: Self-pay | Admitting: Oncology

## 2010-10-02 ENCOUNTER — Encounter (HOSPITAL_BASED_OUTPATIENT_CLINIC_OR_DEPARTMENT_OTHER): Payer: Medicare Other | Admitting: Oncology

## 2010-10-02 DIAGNOSIS — D473 Essential (hemorrhagic) thrombocythemia: Secondary | ICD-10-CM

## 2010-10-02 LAB — CBC WITH DIFFERENTIAL/PLATELET
BASO%: 0.7 % (ref 0.0–2.0)
LYMPH%: 22.7 % (ref 14.0–49.0)
MCH: 30.6 pg (ref 27.2–33.4)
MCHC: 32.4 g/dL (ref 32.0–36.0)
MCV: 94.5 fL (ref 79.3–98.0)
MONO%: 7 % (ref 0.0–14.0)
Platelets: 143 10*3/uL (ref 140–400)
RBC: 3.3 10*6/uL — ABNORMAL LOW (ref 4.20–5.82)

## 2010-10-17 NOTE — Assessment & Plan Note (Signed)
OFFICE VISIT   [name], [name]  DOB:  12/24/1931                                       09/07/2009  ZOXWR#:[id number]   The patient is a 77 year old male who we previously saw in September of  2010 for consideration of hemodialysis access.  The patient deferred  having an access placed at that time.  His renal function overall has  been fairly stable and he returns today for further followup for  consideration of placement of an access as well as for evaluation of  possible renal artery stenosis.  Of note, he previously underwent  bilateral selective renal arteriograms by my partner, Dr. Madilyn Fireman, in May  of 2008.  At that time he had a 30%-40% right renal artery stenosis and  a 50%-60% left renal artery stenosis.   Currently the patient says his blood pressure control is good on some  days and not so good on others.  We again discussed whether or not to  place a hemodialysis access and he again does not want this placed at  this time.  His current blood pressure medications include Toprol-XL 100  mg twice a day, Catapres TTS 3 mg t.i.d., furosemide 40 mg b.i.d.   CHRONIC MEDICAL PROBLEMS:  Include hypertension, coronary artery disease  and chronic renal insufficiency all of which are currently stable.   FAMILY HISTORY:  Remarkable for coronary disease in his father at age  less than 17.   SOCIAL HISTORY:  He is married, has two children.  He is retired.  He is  a former smoker, quit in 1975.   REVIEW OF SYSTEMS:  Full 12 point review of systems was performed with  the patient today.  Please see intake referral form for details  regarding this.   MEDICATIONS:  Include anagrelide, hydroxyurea, Toprol-XL, Catapres,  furosemide, Protonix, Avodart, Metamucil, multivitamin, aspirin 81 mg  once a day.   PHYSICAL EXAM:  Vital signs:  Blood pressure is 141/69 in the left arm,  heart rate is 62 and regular.  Oxygen saturation is 100% on room air.  HEENT:   Unremarkable.  Neck:  Has 2+ carotid pulses without bruit.  Chest:  Clear to auscultation.  Cardiac:  Exam is regular rate and  rhythm without murmur.  Abdomen:  Has no bruits.  It is soft, nontender,  nondistended.  No masses.  Extremities:  He has 2+ femoral pulses  bilaterally.  Skin:  Has no ulcers or rashes.  Neurologic:  Exam shows  symmetric upper extremity and lower extremity motor strength which is  5/5 and symmetric.  Musculoskeletal:  Exam shows no obvious major joint  deformities.   He had a renal duplex exam performed today which was interpreted by me.  This showed bilateral Doppler velocities consistent with greater than  60% bilateral renal artery stenosis.  Left kidney length was 8 cm; right  kidney length was 9 cm; resistive index was greater than 0.7  bilaterally.   I discussed the patient's current clinical condition with Dr. Arrie Aran  by phone today.  Again, he is currently refusing to have any  hemodialysis access placed but he would consider having a repeat renal  arteriogram.  In discussion with Dr. Arrie Aran since the patient has  had some decline of renal function and has also had some renal atrophy  we agreed that he would  best be served to see if he has any evidence of  renal artery stenosis.  We will schedule him for an arteriogram,  possible angioplasty and stenting of his renal arteries.  Risks,  benefits, possible complications and procedure details were explained to  the patient today including but not limited to bleeding, infection,  vessel injury.  He understands and agrees to proceed.  His renal  arteriogram is scheduled with carbon dioxide contrast 09/16/2009.     Janetta Hora. Fields, MD  Electronically Signed   CEF/MEDQ  D:  09/07/2009  T:  09/08/2009  Job:  [id number]   cc:   [name], M.D.  [name]. [name], M.D.  Bertram Millard. [name], M.D.

## 2010-10-17 NOTE — Op Note (Signed)
[name], [name]                 ACCOUNT NO.:  [id number]   MEDICAL RECORD NO.:  [id number]          PATIENT TYPE:  AMB   LOCATION:  SDS                          FACILITY:  [facility]   PHYSICIAN:  Balinda Quails, M.D.    DATE OF BIRTH:  06-20-1931   DATE OF PROCEDURE:  10/23/2006  DATE OF DISCHARGE:                               OPERATIVE REPORT   PHYSICIAN:  Denman George, MD.   DIAGNOSIS:  Accelerated hypertension.   PROCEDURES:  1. Bilateral selective renal arteriograms.  2. Retrograde right femoral arteriogram.   ACCESS:  Right common femoral artery 6-French sheath.   CONTRAST:  25 mL Visipaque.   COMPLICATIONS:  None apparent.   CLINICAL NOTE:  [name] is a 77 year old male patient of Drs. Toy Care, with recently-accelerated hypertension, difficult to  control.  Current antihypertensive medications include the Catapres TTS,  Toprol and furosemide.  He denies any history of headache.   Renal Doppler revealed bilateral renal artery stenosis.  This was noted  be more severe in the left renal artery.  Estimated be greater than 60%  on the left, estimated to be less than 60% on the right.  The patient  brought to the catheterization lab at this time for diagnostic  arteriography and possible renal artery intervention.   PROCEDURE NOTE:  The patient brought to the catheterization lab in  stable condition.  Placed in supine position.  Right groin prepped and  draped in a sterile fashion.  Skin and subcutaneous tissue instilled  with 1% Xylocaine.  The right common femoral artery accessed with an 18-  gauge needle.  A 0.035 Wholey guidewire advanced through the needle into  the abdominal aorta.  Needle removed, the site opened with an 11 blade.  A 6-French sheath advanced over the guidewire.   A short right coronary catheter advanced over the guidewire.  This was  easily engaged into the origin of the left renal artery.  Two separate  views of the left renal  artery were obtained with hand injection of  contrast.  This revealed a 50-60% left renal artery stenosis.   The catheter then disengaged from the left renal artery and engaged in  the right main renal artery.  Right renal arteriography obtained with  hand injection of contrast.  This revealed a 30-40% right renal artery  stenosis.   Renal artery disease was not felt to be severe enough to warrant  intervention.  The patient tolerated the procedure well.  There no  complications.  The catheter removed.   A retrograde right femoral arteriogram was obtained for purposes of  possible Star Close placement.  However, the cannulation site was close  to the bifurcation of the right femoral artery, therefore no closure  device used.   The patient transferred to the holding area stable condition.   FINAL IMPRESSION:  1. Accelerated hypertension.  2. A 30-40% right renal artery stenosis, 50-60% left renal artery      stenosis.  3. No renal artery intervention undertaken at this time as disease was  not felt to be severe enough to warrant intervention.      Balinda Quails, M.D.  Electronically Signed     PGH/MEDQ  D:  10/23/2006  T:  10/23/2006  Job:  [id number]   cc:   [name]. [name], M.D.  Bertram Millard. [name], M.D.

## 2010-10-17 NOTE — Procedures (Signed)
RENAL ARTERY DUPLEX EVALUATION   INDICATION:  Followup of renal disease.   HISTORY:  Diabetes:  No.  Cardiac:  CABG, angina.  Hypertension:  Yes.  Smoking:  Previous.   RENAL ARTERY DUPLEX FINDINGS:  Aorta-Proximal:  47 cm/s  Aorta-Mid:  39 cm/s  Aorta-Distal:  42 cm/s  Celiac Artery Origin:  117 cm/s  SMA Origin:  142 cm/s                                    RIGHT               LEFT  Renal Artery Origin:             190 cm/s            266 cm/s  Renal Artery Proximal:           171 cm/s            212 cm/s  Renal Artery Mid:                99 cm/s             164 cm/s  Renal Artery Distal:             105 cm/s            135 cm/s  Hilar Acceleration Time (AT):    m/s2                m/s2  Renal-Aortic Ratio (RAR):        4.0                 5.6  Kidney Size:                     9.7 cm              8.12 cm  End Diastolic Ratio (EDR):  Resistive Index (RI):            72, 73              46, 77   IMPRESSION:  1. Doppler velocities and resistive index suggest a bilateral greater      than 60% renal artery stenosis.  2. Bilateral kidneys measure within normal limits.  3. Incidental finding of a large cyst measuring 3.93 x 4 cm noted at      the proximal left kidney.   ___________________________________________  Janetta Hora Fields, MD   CJ/MEDQ  D:  09/07/2009  T:  09/07/2009  Job:  [id number]

## 2010-10-17 NOTE — Assessment & Plan Note (Signed)
[facility]                         GASTROENTEROLOGY OFFICE NOTE   NAME:Mcfarlan, VEGAS FRITZE                        MRN:          [id number]  DATE:09/15/2007                            DOB:          Dec 02, 1931    PROBLEM:  Esophageal stricture.   Mr. Copenhaver has returned for ongoing evaluation of his stricture.  He was  last dilated in January 2009.  Prior to that was November 2008.  He is  beginning to have recurrent dysphagia to solids.  He hasa recurrent  peptic esophageal stricture.  He remains on Protonix.   PHYSICAL EXAMINATION:  Pulse is 76.  Blood pressure 126/60.  Weight 171.   IMPRESSION:  Recurrent esophageal stricture.   RECOMMENDATION:  Endoscopy with balloon dilatation.  I think he is going  to require dilatation approximately every 10 weeks.     Barbette Hair. Arlyce Dice, MD,FACG  Electronically Signed    RDK/MedQ  DD: 09/15/2007  DT: 09/15/2007  Job #: [id number]

## 2010-10-17 NOTE — Op Note (Signed)
[name], [name]                 ACCOUNT NO.:  [id number]   MEDICAL RECORD NO.:  [id number]          PATIENT TYPE:  INP   LOCATION:  5029                         FACILITY:  [facility]   PHYSICIAN:  [name], M.D. DATE OF BIRTH:  1932/05/09   DATE OF PROCEDURE:  04/19/2008  DATE OF DISCHARGE:                               OPERATIVE REPORT   PREOPERATIVE DIAGNOSIS:  Right knee severe valgus degenerative joint  disease.   POSTOPERATIVE DIAGNOSIS:  Right knee severe valgus degenerative joint  disease.   PROCEDURE:  1. Right total knee replacement using DePuy cemented total knee system      with #5 cemented femur, #4 cemented tibia with 10-mm polyethylene      RP tibial spacer and 35-mm polyethylene cemented patella.  2. Right total knee, computer-assisted navigation.   SURGEON:  Elana Alm. Thurston Hole, MD   ASSISTANT:  Julien Girt, PA   ANESTHESIA:  General.   OPERATIVE TIME:  1 hour and 45 minutes.   COMPLICATIONS:  None.   DESCRIPTION OF PROCEDURE:  [name] was brought to the operating room  on April 19, 2008, after a femoral nerve block was placed in the  holding area by Anesthesia.  He was placed on operative table in supine  position.  He received antibiotics preoperatively for prophylaxis.  After being placed under general anesthesia, he had a Foley catheter  placed under sterile conditions.  His right knee was examined.  Range of  motion from -5 to 145 degrees with moderate valgus deformity.  Knee  stable to varus valgus and posterior stress with normal patellar  tracking.  The right leg was prepped using sterile DuraPrep and draped  using sterile technique.  Leg was exsanguinated and a thigh tourniquet  elevated to 365 mm.  Initially through a 15-cm longitudinal incision  based over the patella, initial exposure was made.  The underlying subcutaneous tissues were incised along with the skin  incision.  Median arthrotomy was performed revealing an excessive  amount  of normal-appearing joint fluid.  The articular surfaces were inspected.  He had grade 2 and 3 changes medially, grade 4 changes laterally, and  grade 3 and 4 changes in the patellofemoral joint.  Osteophytes removed  from the femoral condyles and tibial plateau.  The medial lateral  meniscal remnants were removed as well as the anterior cruciate  ligament.  At this point, two pins from the navigation set were placed  in the proximal tibial metaphysis and two pins in the distal femoral  metaphysis and then the computer navigation system was activated.  Initial measurements were made.  Range of motion showed from -5 to 140  degrees with 12 degrees of valgus deformity.  At this point, then the  navigation system having been registered, then the distal femoral cut  was made using the navigation system resecting 11-mm off the distal  femur.  The distal femur was sized with the navigation system for a #5  femur and then the anteroposterior and chamfer cuts were all made with  the navigation system in the appropriate manner of external  rotation  with perfect and exact cuts.  After this was done, then the proximal  tibial cut was also made using the navigation system as well.  At this  point, thin spacer blocks were placed in flexion and extension, 10-mm  blocks gave excellent balancing, excellent stability, and excellent  correction of his flexion and valgus deformities, again confirmed by the  navigation.  Then the proximal tibia was again exposed and a #4 tibial  base plate trial was placed with an excellent fit and the keel cut was  made.  The PCL box cutter was then placed on the distal femur and these  cuts were made.  At this point, with a #5 femoral trial in place and the  #4 tibial base plate trial in place in a 10-mm polyethylene RP tibial  spacer, the knee was reduced, taken through range of motion from 0 to  140 degrees with excellent stability and excellent correction of  his  flexion and varus deformities and normal patellar tracking.  All  confirmed by the navigation system as well.  At this point, it was felt  that all the trial components were of perfect size, fit, and stability.  The navigation system was then deactivated and the pins were removed.  At this point, the trial components were removed.  The knee was jet  lavage irrigated with 3 liters of saline.  The proximal tibia was then  exposed and then a #4 tibial baseplate with cement backing was hammered  into position with an excellent fit with excess cement being removed  from around the edges.  A #5 femoral component with cement backing was  hammered in position also with an excellent fit with excess cement being  removed from around the edges.  The 10-mm polyethylene RP tibial spacer  was then placed on the tibial baseplate and the knee reduced, taken  through full range of motion, found to be stable with excellent  correction of his flexion and varus deformities and normal patellar  tracking with a 35-mm polyethylene cement backed patella.  At this  point, it was felt that all the components were of excellent size and  stability.  The knee was further irrigated with saline and then the  tourniquet was released.  Hemostasis obtained with Bovie for venous  bleeding.  The arthrotomy was then closed with #1 Ethibond suture with 2  medium Hemovac drains.  Subcutaneous tissues closed with 0 and 2-0  Vicryl, subcuticular layer closed with 4-0 Monocryl.  Sterile dressings  and a long-leg splint applied and then the patient awakened and taken to  recovery room in stable condition.  Needle and sponge counts were  correct x2 at the end of the case.  Neurovascular status normal  postoperatively as well.      [name], M.D.  Electronically Signed     RAW/MEDQ  D:  04/19/2008  T:  04/20/2008  Job:  [id number]

## 2010-10-17 NOTE — Procedures (Signed)
RENAL ARTERY DUPLEX EVALUATION   INDICATION:  Increased potassium levels, history of renal artery  stenosis.   HISTORY:  Diabetes:  No.  Cardiac:  CABG, angina.  Hypertension:  Yes.  Smoking:  Previous.   RENAL ARTERY DUPLEX FINDINGS:  Aorta-Proximal:  Not visualized  Aorta-Mid:  61 cm/s  Aorta-Distal:  63 cm/s  Celiac Artery Origin:  Not visualized  SMA Origin:  Not visualized                                    RIGHT               LEFT  Renal Artery Origin:             113 cm/s            83 cm/s  Renal Artery Proximal:           204 cm/s            221 cm/s  Renal Artery Mid:                93 cm/s             60 cm/s  Renal Artery Distal:             64 cm/s             52 cm/s  Hilar Acceleration Time (AT):  Renal-Aortic Ratio (RAR):        3.3                 3.6  Kidney Size:                     10.9 cm             8.4 cm  End Diastolic Ratio (EDR):  Resistive Index (RI):            0.74                0.82   IMPRESSION:  1. Doppler velocities suggest a 40-50% stenosis of the right renal      artery and a >60% stenosis of the left renal artery.  2. Abnormal bilateral resistive indices are noted.  3. The bilateral kidney length measurements are within normal limits      with the left kidney measuring smaller than the right.  4. Unable to adequately visualize the proximal aorta, celiac trunk or      SMA due to overlying bowel gas patterns.  5. Preliminary report was faxed to Dr. Celedonio Miyamoto office on      04/14/2008.   INCIDENTAL FINDING:  There are cysts that appear to originate from the  lower pole of both kidneys.  The one on the right measures 6 cm and the  one on the left side measures 5 cm.   ___________________________________________  P. Liliane Bade, M.D.   CH/MEDQ  D:  04/14/2008  T:  04/14/2008  Job:  [id number]

## 2010-10-17 NOTE — Consult Note (Signed)
[name], [name]                 ACCOUNT NO.:  [id number]   MEDICAL RECORD NO.:  [id number]          PATIENT TYPE:  INP   LOCATION:  5029                         FACILITY:  [facility]   PHYSICIAN:  James L. Deterding, M.D.DATE OF BIRTH:  Sep 01, 1931   DATE OF CONSULTATION:  04/19/2008  DATE OF DISCHARGE:                                 CONSULTATION   PRIMARY CARE PHYSICIAN:  Dr. [name].   REASON FOR CONSULT:  Chronic kidney disease, hyperkalemia.   HISTORY OF PRESENT ILLNESS:  This is a 77 year old gentleman with CKD,  stage III followed by Dr. Arrie Aran probably on the basis of  hypertension.  He has had noncritical renovascular disease also.  History of coronary artery disease in the past status post 3-vessel  CABG, history of essential thrombocytopenia followed by Dr. Arline Asp on  current therapy, history of vitamin B deficiency in the past, history of  C. diff in the past, hypertension, and history of recurrent  hyperkalemia, not clear if it is related to essential thrombocytopenia.  He is admitted for right total knee replacement by Dr. Thurston Hole.  Postoperatively, the only complaint is his dry mouth.   MEDICATIONS AT HOME:  1. Anagrelide 2 mg a day.  2. Hydroxyurea 1500 mg a week.  3. Toprol-XL 100 mg b.i.d.  4. Lasix 40 mg b.i.d.  5. Protonix 40 mg a day.  6. Aspirin 325 mg a day.  7. Catapres 0.2 b.i.d.  8. Avodart 0.5 mg a day.   ALLERGIES:  Questionable allergy to NORVASC.   MEDICAL ILLNESS:  As listed above.   SOCIAL HISTORY:  He lives in [place] with his wife.  He is a retired  Fish farm manager.  He has 2 children.  A 60-pack-year history of smoking, quit in  1985.  Stopped drinking in 1986.   FAMILY HISTORY:  Mother died of heart failure at age 23.  Father died at  age 83 of heart disease.  No family history of kidney disease.   REVIEW OF SYSTEMS:  GENERAL:  He has been doing fairly well.  No  breathing difficulty.  His appetite is good  as well at home __________.  HEENT:  He has had an iridectomy on the right with vitrectomy.  He has  slight decreased vision, wears glasses.  NECK:  Unremarkable.  CARDIOPULMONARY:  He sleeps on 1-2 pillows.  No PND or orthopnea.  No  palpitations, no claudications, and no dyspnea on exertion.  No polyuria  or polydipsia.  GU:  He has nocturia x1.  No dysuria or hematuria.  NEUROPSYCHIATRIC:  Unremarkable.  MUSCULOSKELETAL:  As listed above,  which is the knee.  GI:  He has had no nausea, vomiting, indigestion, or  heartburn.  No history of hepatitis, jaundice, constipation, or  diarrhea.   OBJECTIVE/PHYSICAL EXAMINATION:  VITAL SIGNS:  Temperature 96.8, pulse  50, respiratory rate 12, blood pressure 149/60, and O2 sat 98% sat on 2  L.  GENERAL:  He is alert, cooperative, somewhat pale.  HEENT:  Pharynx and fundi showed iridectomy on the left.  NECK:  Unremarkable with no masses or thyromegaly.  He has no  significant adenopathy in the axilla or supraclavicular area, but he has  posterior cervical lymph nodes that are palpable.  CARDIOVASCULAR:  Regular rate and rhythm.  Grade 1-2/6 systolic ejection  murmur best heard at the left upper sternal border.  Pulse 2+/4+.  No  edema.  No bruits.  LUNGS:  No rales, rhonchi, or wheezes.  Slight decreased breath sounds,  slight decreased expansion.  SKIN:  He is somewhat pale, he has some telangiectasia and few moles.  ABDOMEN:  Active bowel sounds, soft, and nontender.  No organomegaly.  GU:  Deferred.  RECTAL:  Deferred.  EXTREMITIES:  Unremarkable.  MUSCULOSKELETAL:  Did not examine the knee at this time, otherwise, he  has some hypertrophic changes at PIPs, DIPs.  NEUROLOGICAL:  Alert and oriented x3.  Cranial nerves II through XII  grossly intact.  Motor:  Did not check the right lower extremity,  otherwise is 5/5.   LABORATORY DATA:  His hemoglobin is 11, white count is 4800, and  platelets 190,000.  Sodium 136, potassium 5.6, bicarbonate of 27,  chloride 103,  creatinine 1.88, and BUN is 61.  LFT is normal and albumin  of 3.7.   ASSESSMENT:  1. Chronic kidney disease stage III.  Volume__________; he got some      Kayexalate preop for his potassium.  We will follow that again in      the morning.  Limit the potassium and IV fluids that he is getting      now, we stopped those.  Otherwise, he is stable.  He will need to      avoid nephrotoxins and avoid excess fluids and follow his blood      pressure and volume closely.  2. Hyperkalemia.  3. Bradycardia.  We will hold his Toprol and his Catapres, because      both can suppress heart rate.  His blood pressure is under fair      control; will follow that closely, he may need another agent that      does not suppress his heart rate.  4. Anemia.  Follow his hemoglobin.  5. Secondary hyperparathyroidism/vitamin B deficiency.  Last level on      PTH normal.  6. Coronary artery disease, stable.  7. History of total knee replacement at the current time.  8. Essential thrombocythemia, question if his hyperkalemic, could be      related to that.   PLAN:  As above.           ______________________________  Llana Aliment. Deterding, M.D.     JLD/MEDQ  D:  04/19/2008  T:  04/20/2008  Job:  [id number]

## 2010-10-17 NOTE — Assessment & Plan Note (Signed)
OFFICE VISIT   CALDER, OBLINGER  DOB:  1932-01-01                                       02/16/2009  ZOXWR#:[id number]   The patient is a 77 year old male referred by Dr. Arrie Aran for  placement of a hemodialysis access.  His renal failure is thought to be  secondary to hypertension and renal vascular disease.   PAST MEDICAL HISTORY:  Is also significant for anemia, essential  thrombocytosis  He is right-handed.  Also significant for coronary  artery bypass grafting.   MEDICATIONS:  1. Include anagrelide 1 mg twice a day.  2. Hydroxyurea 500 mg three per week.  3. Toprol-XL 100 mg twice a day.  4. Catapres TTS 0.2 mg 2 a day.  5. Furosemide 40 mg 2 a day.  6. Protonix 40 mg once a day.  7. Avodart 0.5 mg once a day.  8. Tramadol 50 mg as needed for pain.   PHYSICAL EXAM:  Blood pressure is 112/61 in the left arm, pulse is 60  and regular.  Upper extremities:  He has 2+ brachial, radial pulses  bilaterally.  With placement of a tourniquet on the left arm there is a  large cephalic vein coursing up the forearm from the wrist.   He had a vein mapping ultrasound today which showed that the cephalic  vein on the left side is between 30 and 50 mm in diameter.  The right  side is also between 30 and 50 mm in diameter.  The cephalic vein in the  upper arm is 60 mm in diameter.   I believe the best option for the patient currently would be placement  of a left radiocephalic AV fistula.  We will schedule this for him in  the near future.  The risks, benefits, possible complications and  procedure details were explained to the patient today including but not  limited to bleeding, infection, nonmaturation of the fistula, nerve  injury.  He understands and agrees to proceed.   Janetta Hora. Fields, MD  Electronically Signed   CEF/MEDQ  D:  02/16/2009  T:  02/17/2009  Job:  [id number]

## 2010-10-17 NOTE — Assessment & Plan Note (Signed)
[facility]                         GASTROENTEROLOGY OFFICE NOTE   NAME:[name], [name]                        MRN:          [id number]  DATE:06/09/2007                            DOB:          July 25, 1931    PROBLEM:  Esophageal stricture.   [name] has returned for scheduled GI followup.  On April 29, 2007  a distal esophageal stricture was dilated to 16.5 mm.  There was  moderate resistance and evidence for acute inflammation.  He has been on  Protonix alternating with Nexium since that time.  Dysphagia has  resolved.   EXAMINATION:  Pulse 72, blood pressure 130/78, weight 166.   IMPRESSION:  Peptic esophageal stricture.   RECOMMENDATIONS:  1. Continue Protonix indefinitely while discontinuing Nexium.  2. Repeat dilatation to a diameter of approximately 18 mm.     Barbette Hair. Arlyce Dice, MD,FACG  Electronically Signed    RDK/MedQ  DD: 06/09/2007  DT: 06/09/2007  Job #: [id number]

## 2010-10-17 NOTE — Procedures (Signed)
CEPHALIC VEIN MAPPING   INDICATION:  Preop evaluation.   HISTORY:  Stage 4 chronic kidney disease.   EXAM:  The right cephalic vein is compressible with diameter  measurements ranging from 0.37 to 0.7 cm.   The left cephalic vein is compressible with diameter measurements  ranging from 0.37 to 0.65 cm.   See attached worksheet for all measurements.   IMPRESSION:  Patent bilateral cephalic veins with diameter measurements  as described above and on the attached worksheet.   ___________________________________________  Janetta Hora. Fields, MD   CH/MEDQ  D:  02/16/2009  T:  02/17/2009  Job:  [id number]

## 2010-10-20 NOTE — Op Note (Signed)
[name], [name]                 ACCOUNT NO.:  [id number]   MEDICAL RECORD NO.:  [id number]          PATIENT TYPE:  INP   LOCATION:  2302                         FACILITY:  [facility]   PHYSICIAN:  Kathlee Nations Trigt III, M.D.DATE OF BIRTH:  1931/11/26   DATE OF PROCEDURE:  09/13/2004  DATE OF DISCHARGE:                                 OPERATIVE REPORT   OPERATION:  Coronary artery bypass grafting x5 (left internal mammary artery  to left anterior descending, saphenous vein graft to diagonal, sequential  saphenous vein graft to obtuse marginal 1 and obtuse marginal 2, saphenous  vein graft to right coronary artery).   PREOPERATIVE DIAGNOSIS:  Class IV unstable angina with severe three-vessel  coronary artery disease.   POSTOPERATIVE DIAGNOSIS:  Class IV unstable angina with severe three-vessel  coronary artery disease.   SURGEON:  Kerin Perna, M.D.   ASSISTANT:  Coral Ceo, PA-C   ANESTHESIA:  General.   INDICATIONS:  The patient is a 77 year old male who has had progressive  chest pain.  Cardiac catheterization by Dr. [name] demonstrated severe  coronary disease.  He is felt to be candidate for surgical coronary  revascularization.  Prior to surgery, I examined the patient in the cath lab  holding area and reviewed the results of the cardiac cath with the patient  and family.  I discussed the indications and expected benefits of coronary  bypass surgery with the patient.  I also reviewed the major risks to him of  the heart bypass surgery including risks of MI, CVA, bleeding, blood  transfusion requirement, infection, and death.  He understood these  implications for the surgery and agreed to proceed with the operation as  planned under what I felt was an informed consent.   OPERATIVE FINDINGS:  The patient' coronaries were somewhat small.  The  saphenous vein was harvested from the left leg endoscopically and was of the  above-average quality.  The mammary artery was a  good vessel with excellent  flow.  The heart was enlarged.  The patient received 2 units of packed cells  preoperatively for a starting hematocrit of 27.  The patient has a chronic  hematologic problem, followed by a hematologist for thrombocytosis and  anemia.   PROCEDURE:  The patient was brought to the operating room and placed supine  on the operating room table where general anesthesia was induced under  invasive hemodynamic monitoring.  The chest, abdomen and legs were prepped  with Betadine and draped as a sterile field.  A sternal incision was made as  the saphenous vein was harvested endoscopically from the left leg.  The left  internal mammary artery was harvested as a pedicle graft from its origin at  the subclavian vessels and was a good vessel with excellent flow.  Heparin  was administered and the ACT was documented as being therapeutic.  The  sternal retractor was placed and the pericardium opened and suspended.  Pursestrings were placed in the ascending aorta and right atrium, and the  patient was cannulated placed on bypass.  The coronaries were identified for  grafting.  The mammary artery and vein grafts were prepared for the distal  anastomoses.  Cardioplegia catheters were placed for both antegrade aortic  and retrograde coronary sinus cardioplegia.  The patient was cooled to 30  degrees.  The aortic crossclamp was applied.  Eight hundred mL of cold blood  cardioplegia were delivered in split doses between the antegrade aortic and  retrograde coronary sinus catheters.  There was a good cardioplegic arrest  and septal temperature dropped to less than 12 degrees.  Topical iced saline  was used to augment myocardial preservation and a pericardial insulator pad  was used to protect the left phrenic nerve.   The distal coronary anastomoses were then performed.  The first distal  anastomosis was to the right coronary.  There is a 1.8-mm vessel with  proximal 80% stenosis.   A reversed saphenous vein was sewn end-to-side with  a running 7-0 Prolene with good flow through graft.  A second distal  anastomosis was to the diagonal.  This was a 1.5-mm vessel with a proximal  90% stenosis.  A reversed saphenous vein was sewn end-to-side with a running  7-0 Prolene with good flow through graft.  The third and fourth distal  anastomoses consisted of a sequential vein graft for the OM-1 and OM-2.  The  OM-1 was intramyocardial was a 1.8-mm vessel with proximal 80% to 90%  stenosis.  A side-to-side anastomosis with the vein was sewn using running 7-  0 Prolene with good flow through graft.  The fourth distal anastomosis was a  continuation of the sequential vein to the OM-2.  This was a smaller 1.5-mm  vessel with proximal 80% stenosis.  An end-to-side anastomosis with the vein  was sewn using running 7-0 Prolene and there was good flow through graft.  Cardioplegia was redosed.  The fifth distal anastomosis was to the distal  third of the LAD.  It had proximal 90% stenosis and some distal diffuse  disease.  The left IMA pedicle was brought to an opening created in the left  lateral pericardium and was brought down onto the LAD and sewn end-to-side  with a running 8-0 Prolene.  There was excellent flow through the  anastomosis with immediate rise in septal temperature after briefly  releasing the pedicle bulldog on the mammary pedicle.  The bulldog was  reapplied to the pedicle and the pedicle was secured to the epicardium.  Cardioplegia was redosed.   While the crossclamp was still in place, 3 proximal vein anastomoses were  placed on the ascending aorta using a 4.0-mm punch and running 6-0 Prolene.  Prior to tying the final proximal anastomosis, air was vented from the  coronaries and the left side of the heart using a dose of retrograde warm  blood cardioplegia and the usual de-airing maneuvers on bypass with filling of the heart.  The crossclamp was then removed as  the last proximal  anastomosis was tied.   The heart resumed a spontaneous rhythm.  Air was aspirated from the vein  grafts with a 27-gauge needle and the bypass grafts were opened and  perfused, and had good flow.  Hemostasis was documented at the proximal and  distal anastomoses.  The patient was rewarmed.  The cardioplegia cannulas  were removed.  Temporary pacing wires were applied.  The lungs were re-  expanded and ventilator was resumed.  When the patient reached 37 degrees,  he was weaned from bypass on low-dose dopamine with stable blood pressure  and  cardiac output.  Protamine was administered without adverse reaction.  The cannulas were removed.  The mediastinum was irrigated with warm  antibiotic irrigation.  The leg incision was irrigated and closed in a  standard fashion.  The pericardium was approximated superiorly.  Two  mediastinal and a left pleural chest tube were placed and brought out  through  separate incisions.  The sternum was closed with interrupted steel wire.  The pectoralis fascia was closed in running #1 Vicryl.  The subcutaneous and  skin layers were closed with a running Vicryl and sterile dressings were  applied.  Total bypass time was 150 minutes with crossclamp time of 94  minutes.      PV/MEDQ  D:  09/13/2004  T:  09/14/2004  Job:  [id number]   cc:   [name]. [name], M.D.  Fax: [phone]   [facility] Office

## 2010-10-20 NOTE — Discharge Summary (Signed)
NAMEJACOBY, [name]                 ACCOUNT NO.:  [id number]   MEDICAL RECORD NO.:  [id number]          PATIENT TYPE:  INP   LOCATION:  3740                         FACILITY:  [facility]   PHYSICIAN:  Elmore Guise., M.D.DATE OF BIRTH:  05-16-1932   DATE OF ADMISSION:  10/08/2004  DATE OF DISCHARGE:  10/11/2004                                 DISCHARGE SUMMARY   DISCHARGE DIAGNOSIS:  1.  Atrial flutter with rapid ventricular response.  2.  Successful DC cardioversion with 50 joules.  3.  Recent coronary artery bypass grafting.  4.  History of thrombocytosis on chronic Agrylin.  5.  Presumed lower gastrointestinal infection started empirically on Flagyl.  6.  Hypertension, now well controlled.  7.  Chronic anemia.   HISTORY OF PRESENT ILLNESS:  Patient is a very pleasant 77 year old white  male who was admitted on Oct 08, 2004 with dehydration, diarrhea, lower  abdominal cramping, and atrial flutter with a rapid ventricular response and  a heart rate in the 120-130 range.   HOSPITAL COURSE:  The patient was admitted to telemetry monitor.  He was  rate controlled with IV Cardizem.  He underwent TEE DC cardioversion on Oct 10, 2004 which was successful.  He has now been in normal sinus rhythm for  the last 24 hours.  His lower abdominal cramping has improved significantly.  He was treated with Flagyl 500 mg p.o. t.i.d. for the last three days.  He  is to finish a total of 10-day course.  He did undergo an abdominal  ultrasound to evaluate for any significant liver or gallbladder disease.  There was mild sludge noted, otherwise no significant findings.  His  dehydration improved significantly.  He has now been off IV fluids for the  last 24 hours.  His BUN and creatinine have trended downward from 50 and 2.6  on admission to 40 and 1.8 on discharge.  He does have a chronic anemia and  on discharge from his bypass surgery his hemoglobin was 8.2.  He has had  approximately 1-1/2 L of IV  fluid and his hemoglobin on discharge today is  8.2.  His platelets are 196 and stable on Agrylin twice a day.  His LFTs are  normal.  His C. difficile was negative.  Stool culture was negative.  He is  tolerating a normal diet well and has been up and ambulatory without any  significant problems.  He did get an oral load of IV amiodarone with 400 mg  p.o. b.i.d. during this hospitalization to help him maintain sinus rhythm on  discharge with goal to continue this for 30 days.   DISCHARGE MEDICATIONS:  1.  Toprol XL 100 mg daily.  2.  Flagyl 500 mg t.i.d. for the next five days.  3.  Folic acid 1 mg daily.  4.  Protonix 40 mg daily.  5.  Agrylin 1 mg b.i.d.  6.  Amiodarone 200 mg p.o. b.i.d. for the next month.  7.  Coumadin will be held until Friday.  His INR initially on admission was  4 increasing to 8.2.  On discharge it is 3.4.  8.  He will use Imodium as needed.  9.  He is to stop his iron tablet, Norvasc, Lasix, and digoxin at this time.  10. I did ask him to get a multivitamin with iron secondary to his GI      complaints from his iron pill.   He will follow up with [facility] on Friday, May 12 for PT/INR as  well as CBC at that time.  He will use aspirin 325 mg daily for CVA  prophylaxis at this time and his INR should remain therapeutic until he is  seen in the office.  We will decide at that time should he need further long-  term anticoagulation.  He will also follow up with Dr. Arlyce Dice at [facility]  in one week to evaluate his diarrhea and he will follow up with Dr. Donata Clay at his scheduled appointment on Oct 13, 2004.  He will notify the  office should he have any difficulty.      TWK/MEDQ  D:  10/11/2004  T:  10/11/2004  Job:  [id number]   cc:   [name]. [name], M.D.  Fax: [phone]

## 2010-10-20 NOTE — Op Note (Signed)
[facility]  Patient:    CHANCELLOR, VANDERLOOP Visit Number: [id number] MRN: [id number]          Service Type: DSU Location: DAY Attending Physician:  Carson Myrtle Dictated by:   Sheppard Plumber Earlene Plater, M.D. Proc. Date: 05/08/01 Admit Date:  05/08/2001   CC:         Molly Maduro A. Eliezer Lofts., M.D.  Samul Dada, M.D.   Operative Report  PREOPERATIVE DIAGNOSIS:  Right inguinal hernia.  POSTOPERATIVE DIAGNOSIS:  Right direct and indirect inguinal hernia.  OPERATION PERFORMED:  Repair of hernia with mesh.  SURGEON:  Timothy E. Earlene Plater, M.D.  ANESTHESIA:  Local standby.  INDICATIONS FOR PROCEDURE:  Mr. Criado is 20, considers himself well.  He has stable angina, history of a platelet disorder and esophageal reflux, all of which are under good control.  He elected to proceed with this repair of the right inguinal hernia at this time.  DESCRIPTION OF PROCEDURE:  The patient was brought to the operating room and placed supine, IV started, sedation given.  The right groin had been shaved, it was prepped and draped in the usual fashion.  0.25% Marcaine with epinephrine was used throughout for local anesthesia for a wide field block. A curvilinear incision was made over the palpable defect with scant subcutaneous tissues dissected.  Bleeding points identified and cauterized. External oblique identified, dissected and opened in line with its fibers to the external ring.  A normal-appearing cord was identified.  The ilioinguinal nerve was kept medial to the dissection.  The cord was dissected from the floor of the canal.  Careful dissection at the internal ring revealed a small indirect sac.  This was dissected from the cord structures up to its neck where it was ligated high with a 2-0 Prolene.  Excess sac cut away and retracted back to the internal ring which itself was somewhat open.  Also the medial floor of the canal was quite weak.  A plug and patch  mesh system was used for this repair.  The plug was placed in the internal ring.  The patch was placed over the floor of the canal up to and around the internal ring. This was sewn down with running 2-0 Prolene.  This completed the repair was replaced in its anatomic position.  The external oblique was closed with running 2-0 Vicryl, deep subcutaneous 2-0 Vicryl and skin 3-0 Monocryl. Steri-Strips were applied.  Sponge, sharp and instrument counts were correct. The patient tolerated the procedure well and was removed to recovery room in good condition.  Written and verbal instructions were given him and his wife including percocet for pain.  To resume all usual medications and will be seen and followed as an outpatient. Dictated by:   Sheppard Plumber Earlene Plater, M.D. Attending Physician:  Carson Myrtle DD:  05/08/01 TD:  05/08/01 Job: [id number]

## 2010-10-20 NOTE — Op Note (Signed)
[facility]  Patient:    [name], [name]                        MRN: [id number] Proc. Date: 07/04/00 Adm. Date:  [id number] Attending:  Evlyn Clines CC:         Dorinda Hill, M.D.   Operative Report  PROCEDURE:  Circumcision.  PREOPERATIVE DIAGNOSIS:  Recurrent balanitis.  POSTOPERATIVE DIAGNOSIS:  Recurrent balanitis.  SURGEON:  Excell Seltzer. Annabell Howells, M.D.  ANESTHESIA:  Local with MAC.  COMPLICATIONS:  None.  INDICATIONS:  [name] is a 77 year old white male with recurrent balanitis who has elected to proceed with circumcision.  FINDINGS AT PROCEDURE:  The patient was taken to the operating room where he was placed on the table in a supine position.  He was given light sedation. His genitalia was prepped with Betadine solution, and he was draped in the usual sterile fashion.  A penile block was performed with 20 cc of 1% lidocaine without epinephrine.  A sleeve resection of the redundant foreskin was performed.  Meticulous hemostasis was achieved with the Bovie.  The skin edges were then reapproximated using a running 4-0 chromic stitch interrupted in the quadrants.  A dressing of Xeroform cling and Coban was applied.  At the end of the procedure, the patient appeared to have an excellent result with adequate penile skin and no evidence of bleeding.  At this point, the patient was taken to the recovery room in stable condition.  There were no complications. DD:  07/04/00 TD:  07/04/00 Job: [id number]

## 2010-10-20 NOTE — Cardiovascular Report (Signed)
[name], [name]                 ACCOUNT NO.:  [id number]   MEDICAL RECORD NO.:  [id number]          PATIENT TYPE:  OIB   LOCATION:  2889                         FACILITY:  [facility]   PHYSICIAN:  [name]. [name], M.D.DATE OF BIRTH:  December 30, 1931   DATE OF PROCEDURE:  09/05/2004  DATE OF DISCHARGE:                              CARDIAC CATHETERIZATION   Mr. Record is referred for evaluation of unstable angina.  He had previous  angioplasty in the mid 1990s.  He is now referred for evaluation.  He has a  history of anemia and a history of essential thrombocytosis.   PROCEDURE:  Left heart catheterization with selective coronary angiography,  left ventricular angiography.   TYPE AND SITE OF ENTRY:  Percutaneous right femoral artery.   CATHETERS:  A 6-French 4 curved Judkins right and left coronary catheters, 6-  French pigtail ventriculographic catheter.   CONTRAST MATERIAL:  Omnipaque (150 mL).   MEDICATIONS GIVEN PRIOR TO PROCEDURE:  Valium 10 mg p.o.   MEDICATIONS GIVEN DURING PROCEDURE:  Versed 2 mg IV.   COMMENTS:  Patient tolerated the procedure well.   HEMODYNAMIC DATA:  The aortic pressure was 140/58, LV 149/9-22.  There is no  aortic valve gradient noted on pullback.   ANGIOGRAPHIC DATA:  The left ventricular angiogram was performed in the RAO  position.  Overall cardiac size is normal.  There is mild global hypokinesia  with an ejection fraction of approximately 50%.  Regional wall motion  appears to be mildly hypokinetic in a diffuse fashion.  There is no mitral  regurgitation, intracardiac calcification, or intracavitary filling defect.   CORONARY ARTERIES:  The coronary arteries arise and distribute normally.  1.  Right coronary artery:  The right coronary artery is a moderately large,      dominant vessel.  There is a 50% narrowing in the mid portion of the      right coronary artery.  The distal right coronary artery is relatively      large and free of  significant focal obstructive disease.  There is mild,      somewhat diffuse irregularities.  The vessel was large enough to be      suitable for bypass grafting.  2.  Left main coronary artery is essentially normal.  3.  Left circumflex:  The left circumflex bifurcates early into two large      branches.  One branch has a 90% focal stenosis at the bifurcation.      There is diffuse disease involving the bifurcation both before and after      and into the branches.  The other branch has approximately a 70%      narrowing.  This would be a very complex lesion for angioplasty, but      would be suitable for bypass grafting.  4.  Left anterior descending:  The left anterior descending has 60-70%      proximal disease.  One diagonal vessel has a 90% focal stenosis at the      bifurcation.  Left anterior descending itself has a somewhat diffuse  moderate 60-70% narrowing prior to and involving the bifurcation lesion      of the large proximal diagonal vessel.  The diagonal vessel has moderate      stenosis in the mid portion of the vessel.   OVERALL IMPRESSION:  1.  Low normal ejection fraction with mild global hypokinesia.  2.  Three vessel coronary disease with a 50-60% mid right coronary stenosis,      severe bifurcation lesions in the left anterior descending and diagonal      vessel, and severe stenosis in the left circumflex and obtuse marginal      vessel in a bifurcation distribution.   DISCUSSION:  Mr. Trapani has severe enough disease to warrant coronary artery  bypass grafting with the complex lesions in the proximal LAD and left  circumflex.  He does have the chronic thrombocytopenia with chronic anemia  which certainly would exacerbate the anginal symptoms.  We will need to  evaluate anemia as well as the possible need for coronary artery bypass  grafting.      SNT/MEDQ  D:  09/05/2004  T:  09/05/2004  Job:  [id number]

## 2010-10-20 NOTE — Consult Note (Signed)
NAMEBURRIS, Bryan                 ACCOUNT NO.:  [id number]   MEDICAL RECORD NO.:  [id number]          PATIENT TYPE:  OIB   LOCATION:  2889                         FACILITY:  [facility]   PHYSICIAN:  Kathlee Nations Trigt III, M.D.DATE OF BIRTH:  06-06-1931   DATE OF CONSULTATION:  09/05/2004  DATE OF DISCHARGE:                                   CONSULTATION   REASON FOR CONSULTATION:  Severe three vessel coronary artery disease with  class III angina.   CHIEF COMPLAINT:  Chest pain.   HISTORY OF PRESENT ILLNESS:  I was asked to evaluate this 77 year old white  male for potential surgical coronary revascularization for recently  diagnosed progressive exertional angina.  The patient has a past history of  coronary artery disease and underwent percutaneous intervention with an  angioplasty several years ago.  His symptoms now consists of shortness of  breath with exertion as well as chest tightness which is relieved with rest.  The pain does not occur at rest or at night and he has not taken any  nitroglycerin.  The patient had a cardiac catheterization last in 1995  showing two vessel disease with a 50% stenosis of the diagonal and a 90%  stenosis of the posterolateral left circumflex.  He underwent diagnostic  cath today by Dr. [name] which shows significant progression in his  coronary artery disease.  He now has significant three vessel disease with  90% stenosis in the LAD diagonal bifurcation, 90% stenosis of the circumflex  involving the obtuse marginal, and 50-70% stenosis of the proximal right  coronary.  His ejection fraction was 45-50% with left ventricular diastolic  pressure of 25 mmHg and mild global hypokinesia.  Based on his symptoms and  coronary anatomy, he was felt to be a candidate for surgical  revascularization.   PAST MEDICAL HISTORY:  1.  Thrombocytosis with anemia treated with Agrylin by Dr. Kimberlee Nearing.      His current hemoglobin is 9, current platelet count  270,000.  2.  Retinal vein occlusion.  3.  Hypertension.  4.  GERD.  5.  No known drug allergies.   CURRENT MEDICATIONS:  Nexium 40 mg p.o. daily, Lasix 20 mg p.o. daily,  Toprol XL 50 mg p.o. daily, Agrylin 1 tablet daily, Norvasc 10 mg daily,  buffered aspirin 1 p.o. daily.   SOCIAL HISTORY:  The patient is retired from [employer].  He is  still quite active.  He does not smoke or use alcohol.  He is married and  lives with his wife.   FAMILY HISTORY:  Positive for coronary artery disease and hypertension.   REVIEW OF SYMPTOMS:  General review is negative for fever or weight loss.  ENT:  Review is positive for his left eye retinal vein occlusion with  decreased vision, otherwise, no difficulty swallowing, no active dental  complaints.  THORAX:  Review negative for chest trauma, rib fracture, or  pneumothorax.  GI:  Review is negative for blood per rectum, hepatitis, or  jaundice.  UROLOGICAL:  Review is negative for hematuria or kidney stones.  VASCULAR:  Review is negative for claudication, TIA, or DVT.  NEUROLOGICAL:  Review is negative for stroke or seizure.   PHYSICAL EXAMINATION:  VITAL SIGNS:  The patient is 6 feet 1 inches, weight 175 pounds, blood  pressure 158/74, heart rate 88 and regular, respirations 18.  GENERAL:  Very pleasant elderly white male in the cardiac catheterization  holding area following cardiac catheterization he is in no distress.  He is  accompanied by his wife.  HEENT:  Normocephalic, full EOMs, pharynx clear, dentition good.  NECK:  Without JVD, mass, or carotid bruit.  LYMPHATICS:  Reveal no palpable adenopathy over the supraclavicular fossa,  axillary fossa, or inguinal area.  LUNGS:  Breath sounds clear bilaterally and there is no thoracic deformity.  CARDIAC EXAM:  Regular rhythm without S3 gallop, murmur, or rub.  ABDOMEN:  Soft without pulsatile mass or splenomegaly.  EXTREMITIES:  No clubbing, edema, or cyanosis.  Peripheral pulses  are 2+ in  all extremities.  NEUROLOGIC:  Alert and oriented without focal deficit.   LABORATORY DATA:  His pre-CABG Doppler showed no significant carotid artery  disease.  Brachial pressures are equal bilaterally.  ABIs are normal in each  lower extremity.  His creatinine is 1.6.  His glucose is 96.  LFTs normal.  His white count is 5,000 with a platelet count 265,000 and hemoglobin 9.3.   IMPRESSION AND PLAN:  The patient has significant coronary disease with  class III symptoms.  He would benefit from surgical revascularization.  His  surgery  will be scheduled for Wednesday, April 12.  I have discussed the  procedure in detail with the patient and his wife.  I have reviewed the  indications, benefits, and risks of the operation, as well.  He understands  these issues and agrees to proceed with the operation as planned.      PV/MEDQ  D:  09/05/2004  T:  09/05/2004  Job:  [id number]   cc:   Molly Maduro A. Nicholos Johns, M.D.  [address]  [place]  [place] [address]  Fax: [phone]   [name]. [name], M.D.  Fax: [phone]   Samul Dada, M.D.  [address] [facility]  [place]  [place] [address]  Fax: [phone]

## 2010-10-20 NOTE — Discharge Summary (Signed)
[name], HILTNER NO.:  [id number]   MEDICAL RECORD NO.:  [id number]          PATIENT TYPE:  INP   LOCATION:  2028                         FACILITY:  [facility]   PHYSICIAN:  Kerin Perna, M.D.  DATE OF BIRTH:  [birth year]   DATE OF ADMISSION:  09/13/2004  DATE OF DISCHARGE:  09/17/2004                                 DISCHARGE SUMMARY   ADMISSION DIAGNOSES:  Three-vessel coronary artery disease with Class III  angina.   PAST MEDICAL HISTORY:   DISCHARGE DIAGNOSES:  1.  Thrombocytosis with anemia treated with Agrelin by Dr. Kimberlee Nearing.  2.  Retinal vein occlusion.  3.  Hypertension.  4.  Gastroesophageal reflux disease.  5.  Severe three-vessel coronary artery disease status post coronary artery      bypass grafting x 5.   ALLERGIES:  No known drug allergies.   BRIEF HISTORY:  The patient is a 77 year old Caucasian male who was recently  diagnosed with progressive exertional angina.  The patient has known history  of coronary artery disease and underwent percutaneous intervention with  angioplasty several years ago.  The patient's symptoms prior to admission  consisted of shortness of breath with exertion as well as chest tightness  that was relieved with rest.  Previous cardiac catheterization in 1995  revealed two-vessel disease.  The patient underwent diagnostic  catheterization by Dr. [name] on September 05, 2004, and this revealed  significant progression of coronary artery disease.  Secondary to the  patient's significant three-vessel disease, he was referred to Dr. Kathlee Nations  Trigt of [facility] regarding surgical revascularization.  Ejection fraction was  45 to 50%, and the patient had a mild global hyperkinesia.   Dr. Donata Clay evaluated the patient on September 05, 2004, and it was his opinion  the patient should proceed with coronary artery bypass graft surgery.   HOSPITAL COURSE:  The patient was admitted and taken to the OR on September 13, 2004.   Coronary artery bypass grafting x 5.  The left internal mammary  artery was grafted to the LAD; saphenous vein was grafted to the diagonal.  A sequential saphenous vein was grafted to obtuse marginal #1 and #2, and  saphenous vein was grafted to the right coronary.  Endoscopic vessel  harvesting was performed on the left lower extremity.  The patient tolerated  the procedure well and was hemodynamically stable immediately  postoperatively.  The patient was transferred from the OR to the SICU in  stable condition.  The patient was extubated without complication and woke  up from anesthesia neurologically intact.   The patient's postoperative course has progressed relatively uncomplicated.  On postoperative day #1, the patient was having some bleeding from the skin  of the sternal incision.  Therefore, several silk sutures were placed.  The  bleeding subsequently stopped.  The patient was hemodynamically stable at  that time.  Chest tubes and invasive lines were discontinued in routine  manner without complication.  The patient's drips were also discontinued  without complication.  His Agrelin was restarted on postoperative day #2.  The patient began cardiac rehab on postoperative day #1 and tolerated this  well postoperatively.   On postoperative day #2, hemoglobin was noted to be 7.2 which had decreased  from the day before of 8.2.  The patient was, therefore, transfused with 2  units packed rbc's.  Creatinine was also  noted to be elevated at 2.4;  therefore, diuretics were held.   On postoperative day #4, the patient was without complaint.  His bowel  function had returned, and he was ambulating well.  The patient was afebrile  with stable vital signs and maintaining a normal sinus rhythm.  He was below  his preoperative weight.  The patient's anemia improved after transfusion  and currently 8.3.  The patient was also started on iron and folic acid.  Creatinine is trending down and  is currently 2.1.   PHYSICAL EXAMINATION:  CARDIAC:  Regular rate and rhythm.  LUNGS:  Clear to auscultation bilaterally.  ABDOMEN:  Benign.  SKIN:  Incisions are clean, dry, and intact, and there is only trace edema  present bilateral lower extremities.   The patient is in stable condition at this time.  As long as he continues to  progress in the current manner, he will be ready for discharge in the next  one to two days pending morning round evaluation.   LABORATORY DATA AND OTHER STUDIES:  CBC on BNP on September 17, 2004:  White  count 5.7, hemoglobin 8.3, hematocrit 24.5, platelets 456.  Sodium 136,  potassium 5.2, BUN 44, creatinine 2.1, glucose 106.   CONDITION ON DISCHARGE:  Improved.   DISCHARGE INSTRUCTIONS:   MEDICATIONS:  1.  Aspirin 325 mg p.o. daily.  2.  Toprol XL 25 mg daily.  3.  Nexium 40 mg daily.  4.  Agrelin 1 mg daily.  5.  Colace 200 mg daily p.r.n. constipation.  6.  Niferex 150 mg daily.  7.  Folic acid 1 mg daily.  8.  Darvocet-N 100 one to two p.o. q.4-6h. p.r.n. pain.   ACTIVITY:  No driving, no lifting more than 10 pounds.  The patient is to  continue daily breathing and walking exercises.   DIET:  Low-salt, low-fat.   WOUND CARE:  The patient may shower daily and clean incisions with soap and  water.  If wound problems arise, he should contact the [facility] office.   FOLLOW UP:  1.  Followup appointment with Dr. [name].  He will be instructed to call      his office for appointment two weeks after discharge.  2.  Dr. Donata Clay three weeks after discharge.  The [facility] office will contact      the patient with date and time of this appointment.  The patient will      need to go to have a chest x-ray taken at [facility] imaging one hour      prior to the appointment with Dr. Donata Clay.      AY/MEDQ  D:  09/17/2004  T:  09/17/2004  Job:  [id number]   cc:   [name]. [name], M.D.  Fax: [phone]

## 2010-10-20 NOTE — H&P (Signed)
[name], [name]                 ACCOUNT NO.:  [id number]   MEDICAL RECORD NO.:  [id number]          PATIENT TYPE:  OIB   LOCATION:                               FACILITY:  [facility]   PHYSICIAN:  [name]. [name], M.D.DATE OF BIRTH:  March 09, 1932   DATE OF ADMISSION:  09/05/2004  DATE OF DISCHARGE:                                HISTORY & PHYSICAL   CHIEF COMPLAINT:  Chest pain.   HISTORY OF PRESENT ILLNESS:  Mr. Shawler is a very pleasant 77 year old white  male who has a known history of atherosclerotic cardiovascular disease who  presents to our office as a work-in appointment on September 01, 2004. He has  noted over the last weeks he has begun to have shortness of breath with  exertion as well as chest tightness clearly exertional in nature and is  relieved with rest. This is certainly a change in symptomatology for him and  it is very similar to his previous chest pain syndrome. He does not have  nitroglycerin and has had no symptoms at rest.   PAST MEDICAL HISTORY:  1.  Known atherosclerotic cardiovascular disease. He had catheterization      that dates back to 1993 and 1995. He has had previous angioplasty of the      left circumflex coronary artery in 1993 that was associated with      satisfactory results, however there was loss of the first obtuse      marginal branch. His last catheterization in 1995 showed two-vessel      disease (mild aneurysmal dilatation of the proximal LAD with 50%      narrowing and an 80% narrowing of the first diagonal vessel,      satisfactory long-term patency of the first obtuse marginal branch with      persistent 90% narrowing and a tortuous posterolateral branch of the      left circumflex). He had reasonably well preserved left ventricular      function  with an ejection fraction estimate at 50%.  2.  Thrombocythemia with anemia, currently treated with hydroxyurea in the      past and now on Agrylin and followed by Dr. Kimberlee Nearing.  3.   History of retinal vein occlusion suspected to be secondary to his blood      disorder.  4.  Hypertension.  5.  Gastroesophageal reflux disease.  6.  History of vasectomy dating back to 90.   ALLERGIES:  None.   CURRENT MEDICATIONS:  1.  Nexium 40 mg daily.  2.  Lasix 20  mg daily.  3.  Toprol XL 50 mg daily.  4.  Agrylin daily.  5.  Norvasc 10 mg daily.  6.  Bufferin aspirin daily.   FAMILY HISTORY:  Father died at age 57 with a heart attack.   SOCIAL HISTORY:  He previously worked at [employer]. He lives at home  with his wife. He stopped smoking over 20 years ago. There is no alcohol  use.   REVIEW OF SYSTEMS:  Basically as noted above. He has had  no  recent fever,  flu, or cough. He has had no PND or orthopnea. No complaints of edema. He  has had no complaints of palpitations. His chest pain is exertional in  nature. He has had no symptoms at rest. He has had no nausea, vomiting,  abdominal pain, constipation, or diarrhea.   PHYSICAL EXAMINATION:  GENERAL: On exam, he is a very pleasant elderly white  male who appears younger than his stated age.  VITAL SIGNS: Weight is 170 pounds. Blood pressure is 160/80 sitting and  170/80 standing, heart rate 76, respirations 18. He is afebrile.  SKIN: Warm and dry. Color is unremarkable.  LUNGS: Basically clear.  HEART: Regular rate and rhythm. There is no murmur that I can appreciate.  ABDOMEN: Soft with positive bowel sounds. Nontender.  EXTREMITIES: Without edema.  NEUROLOGIC: Intact. There are no gross focal deficits.   Pertinent labs are pending.   OVERALL IMPRESSION:  1.  Exertional chest pain.  2.  Known history of ischemic heart disease with previous angioplasty in the      remote past.  3.  Hypertension, currently uncontrolled.  4.  History of essential thrombocythemia with anemia, followed by Dr.      Arline Asp.   PLAN:  His Toprol is increased to 100 mg p.o. daily. A prescription was  nitroglycerin was  provided as well with full instruction. Will proceed with  cardiac catheterization on Tuesday, September 05, 2004. The procedure was  reviewed in full detail including the risks and benefits and he is willing  to proceed. He is to call if any problems arise in the interim.      LC/MEDQ  D:  09/01/2004  T:  09/01/2004  Job:  [id number]   cc:   Willis Modena. Dreiling, M.D.  [address]  [place]  [place] [address]  Fax: [phone]   Samul Dada, M.D.  [address] [facility]  [place]  [place] [address]  Fax: [phone]   Elana Alm. Nicholos Johns, M.D.  [address]  [place]  [place] [address]  Fax: [phone]

## 2010-10-20 NOTE — Procedures (Signed)
[facility]  Patient:    [name], [name]                        MRN: [id number] Proc. Date: 11/14/99 Adm. Date:  [id number] Disc. Date: [id number] Attending:  Judeth Cornfield CC:         Willis Modena. Dreiling, M.D.                           Procedure Report  PROCEDURE PERFORMED:  Upper endoscopy.  ENDOSCOPIST:  Barbette Hair. Arlyce Dice, M.D. [facility]  HISTORY:  The patient has benign peptic esophageal stricture for which he has undergone dilatation.  He was last dilated in February 2001.  He had done well until the last few weeks when he has noted intermittent dysphagia.  INFORMED CONSENT:  The patient provided consent after risks, benefits and alternatives were explained.  MEDICATIONS USED:  Versed 5 mg, fentanyl 50 mcg IV and Cetacaine spray.  DESCRIPTION OF PROCEDURE:  The patient was placed in the left lateral decubitus position, administered continuous low-flow oxygen and was placed on pulse oximetry.  The Olympus video gastroscope was inserted under direct vision into the oropharynx and esophagus.  FINDINGS: 1. Again noted at the gastroesophageal junction was a solid fibrotic    stricture.  There was also was superficial 3 mm ulcer.  The 9.8 mm    gastroscope easily passed through the area to enter a small hiatal hernia. 2. Normal stomach and duodenum.  THERAPEUTICS:  With the scope in the distal stomach a guide wire was placed through the scope and the scope was withdrawn.  Under fluoroscopic guidance, numbers 16, 17 and 18 mm Savary dilators were passed with mild resistance.  IMPRESSION: 1. Recurrent esophageal stricture. 2. Esophageal ulcer.  RECOMMENDATIONS: 1. Increase Protonix to 40 mg b.i.d. for four weeks, then q.d. 2. Repeat dilatation in six months. DD:  11/14/99 TD:  11/16/99 Job: [id number]

## 2010-10-20 NOTE — Consult Note (Signed)
[name], [name]                 ACCOUNT NO.:  [id number]   MEDICAL RECORD NO.:  [id number]          PATIENT TYPE:  INP   LOCATION:  3740                         FACILITY:  [facility]   PHYSICIAN:  Cassell Clement, M.D. DATE OF BIRTH:  1932/05/05   DATE OF CONSULTATION:  10/08/2004  DATE OF DISCHARGE:                                   CONSULTATION   CHIEF COMPLAINT:  Fast heart rate.   HISTORY OF PRESENT ILLNESS:  This is a 77 year old married Caucasian  gentleman admitted with recent onset of new atrial flutter and also recent  onset of perfuse watery diarrhea. He has had the diarrhea for about two  weeks. He thinks that he has had the rapid heart rate since about Oct 04, 2004. He saw Dr. Barbette Hair. Arlyce Dice on Oct 05, 2004 who noted a rapid heart  rate and obtained an electrocardiogram which showed atrial flutter and the  patient was referred to our office and seen by Dr. Rosine Abe in Dr.  [name]. Tennant's absence that day. He saw Dr. [name]. [name] the  following day, Oct 06, 2004, and was begun on Coumadin and his Toprol was  increased and Lanoxin was added to his regimen for rate control.   The patient has had diarrhea for approximately two weeks. He called the on  call physician for [facility] yesterday who called him in some metronidazole  500 mg three times a day.   Today, the patient's heart rate remained fast and his wife became concerned  about him. She states she took him to the drug store where the machine  registered more than 120 beats per minute, and because of the patient's  weakness and failure to improve, he was advised to come to the emergency  room where he was evaluated and admitted.   The patient has a past history of a recent coronary artery bypass graft  surgery x5 done by Dr. Kathlee Nations Trigt done on September 13, 2004. The patient  had a smooth hospital course and he did not have any problems with atrial  arrhythmia or diarrhea during the immediate  posthospital course.   PAST MEDICAL HISTORY:  1.  Essential hypertension.  2.  Essential thrombocythemia followed by Dr. Gerarda Fraction. [place]inson. He has      been on Agrylin 1 mg three times a day.  Of note is the fact the      patient's wife indicates that while the patient was in the hospital for      his bypass surgery, he was on a smaller dose of Agrylin.   FAMILY HISTORY:  Positive for coronary artery disease and high blood  pressure.   SOCIAL HISTORY:  Reveals that he is retired from [employer] and [employer]. He does  not use alcohol or tobacco. The patient is married and lives with his wife.   MEDICATIONS:  1.  Niferex 1 tablet daily.  2.  Coumadin 5 mg daily for the past two days.  3.  Agrylin 1 mg t.i.d.  4.  Folic acid 1 mg daily.  5.  Toprol XL 50 mg b.i.d.  6.  Ecotrin 325 mg daily.  7.  Nexium 40 mg daily.  8.  Digitek 0.25 mg daily.  9.  Metronidazole 500 mg t.i.d.   ALLERGIES:  No known drug allergies.   REVIEW OF SYMPTOMS:  The patient has been having four or five loose watery  stools a day. There has been no evidence of hematochezia and stool hemoccult  in the emergency room is negative. The patient denies any urinary symptoms.  He denies any cough or sputum production. He has had no recurrence of angina  since his bypass surgery. Remainder of the review of systems is negative in  detail.   PHYSICAL EXAMINATION:  VITAL SIGNS:  Blood pressure is 130/74, pulse is 118  and atrial fibrillation/flutter.  GENERAL:  Reveals a thin, elderly male in no acute distress.  SKIN:  Warm and dry.  HEENT:  No scleral icterus. Mouth and pharynx normal. Carotids normal.  Jugular venous pressure normal.  CHEST:  Clear.  HEART:  Reveals that the sternum is healing well. There is an S3 gallop.  There is no murmur or rub.  ABDOMEN:  Active bowel sounds and nontender. There is no murmur.  RECTAL:  The stool guaiac is negative.  EXTREMITIES:  No phlebitis or edema.   LABORATORY DATA:   His electrocardiogram in the emergency room shows atrial  flutter with a rapid ventricular response at 120. The chest x-ray is  pending. Lab work is pending.   IMPRESSION:  1.  Recent onset of atrial flutter, probably starting on Oct 04, 2004 by the      patient's history.  2.  Diarrhea, uncertain etiology. Rule out Clostridium difficile. Rule out      adverse effect of high dose Agrylin.  3.  Coronary artery disease with coronary artery bypass graft surgery on      September 13, 2004.  4.  History of thrombocythemia.  5.  History of essential hypertension.  6.  History of gastroesophageal reflux disease on long-term Nexium.   DISPOSITION:  We are admitting to Dr. Rosine Abe in Dr. [name].  Tennant's absence. The patient will be placed on telemetry. The patient will  be on Lovenox and Coumadin. We will employ rate control with IV Cardizem,  p.o. Digitek. We will continue Toprol XL 50 once a day. Laboratory results  are pending but if platelet count is less than 600,000 we can try a lower  dose of Agrylin since high dose can cause arrhythmias as well as diarrhea.  We will also recheck the patient's lipid status since he is not on a Statin  at this time.      TB/MEDQ  D:  10/08/2004  T:  10/09/2004  Job:  [id number]   cc:   Barbette Hair. Arlyce Dice, M.D. [facility]   Samul Dada, M.D.  [address] [facility]  [place]  [place] [address]  Fax: [phone]   Elana Alm. Nicholos Johns, M.D.  [address]  [place]  [place] [address]  Fax: [phone]   Kerin Perna, M.D.  [address]  [place]  [place] [address]   [name]. [name], M.D.  Fax: [phone]   Elmore Guise., M.D.  [address]  [place], [place] [address]  Fax: [phone]

## 2010-10-23 ENCOUNTER — Other Ambulatory Visit (HOSPITAL_COMMUNITY): Payer: Self-pay | Admitting: Oncology

## 2010-10-23 ENCOUNTER — Encounter (HOSPITAL_BASED_OUTPATIENT_CLINIC_OR_DEPARTMENT_OTHER): Payer: Medicare Other | Admitting: Oncology

## 2010-10-23 DIAGNOSIS — D649 Anemia, unspecified: Secondary | ICD-10-CM

## 2010-10-23 DIAGNOSIS — N039 Chronic nephritic syndrome with unspecified morphologic changes: Secondary | ICD-10-CM

## 2010-10-23 DIAGNOSIS — D473 Essential (hemorrhagic) thrombocythemia: Secondary | ICD-10-CM

## 2010-10-23 DIAGNOSIS — N289 Disorder of kidney and ureter, unspecified: Secondary | ICD-10-CM

## 2010-10-23 DIAGNOSIS — N189 Chronic kidney disease, unspecified: Secondary | ICD-10-CM

## 2010-10-23 LAB — CBC WITH DIFFERENTIAL/PLATELET
Basophils Absolute: 0 10*3/uL (ref 0.0–0.1)
EOS%: 3.6 % (ref 0.0–7.0)
Eosinophils Absolute: 0.1 10*3/uL (ref 0.0–0.5)
HCT: 25.3 % — ABNORMAL LOW (ref 38.4–49.9)
HGB: 8.7 g/dL — ABNORMAL LOW (ref 13.0–17.1)
MCH: 32.1 pg (ref 27.2–33.4)
MONO#: 0.3 10*3/uL (ref 0.1–0.9)
NEUT#: 2.7 10*3/uL (ref 1.5–6.5)
NEUT%: 68.3 % (ref 39.0–75.0)
RDW: 14.9 % — ABNORMAL HIGH (ref 11.0–14.6)
WBC: 4 10*3/uL (ref 4.0–10.3)
lymph#: 0.8 10*3/uL — ABNORMAL LOW (ref 0.9–3.3)

## 2010-10-23 LAB — COMPREHENSIVE METABOLIC PANEL
AST: 13 U/L (ref 0–37)
Albumin: 3.6 g/dL (ref 3.5–5.2)
BUN: 67 mg/dL — ABNORMAL HIGH (ref 6–23)
CO2: 25 mEq/L (ref 19–32)
Calcium: 9 mg/dL (ref 8.4–10.5)
Chloride: 100 mEq/L (ref 96–112)
Creatinine, Ser: 2.26 mg/dL — ABNORMAL HIGH (ref 0.40–1.50)
Glucose, Bld: 109 mg/dL — ABNORMAL HIGH (ref 70–99)
Potassium: 5.5 mEq/L — ABNORMAL HIGH (ref 3.5–5.3)

## 2010-10-23 LAB — LACTATE DEHYDROGENASE: LDH: 174 U/L (ref 94–250)

## 2010-10-26 LAB — FERRITIN: Ferritin: 114 ng/mL (ref 22–322)

## 2010-11-13 ENCOUNTER — Encounter (HOSPITAL_BASED_OUTPATIENT_CLINIC_OR_DEPARTMENT_OTHER): Payer: Medicare Other | Admitting: Oncology

## 2010-11-13 ENCOUNTER — Other Ambulatory Visit (HOSPITAL_COMMUNITY): Payer: Self-pay | Admitting: Oncology

## 2010-11-13 DIAGNOSIS — N039 Chronic nephritic syndrome with unspecified morphologic changes: Secondary | ICD-10-CM

## 2010-11-13 DIAGNOSIS — N189 Chronic kidney disease, unspecified: Secondary | ICD-10-CM

## 2010-11-13 DIAGNOSIS — D473 Essential (hemorrhagic) thrombocythemia: Secondary | ICD-10-CM

## 2010-11-13 LAB — CBC WITH DIFFERENTIAL/PLATELET
BASO%: 1.2 % (ref 0.0–2.0)
EOS%: 3.1 % (ref 0.0–7.0)
Eosinophils Absolute: 0.1 10*3/uL (ref 0.0–0.5)
MCH: 30.5 pg (ref 27.2–33.4)
MCV: 95.3 fL (ref 79.3–98.0)
MONO%: 4.9 % (ref 0.0–14.0)
NEUT#: 2.9 10*3/uL (ref 1.5–6.5)
RBC: 2.95 10*6/uL — ABNORMAL LOW (ref 4.20–5.82)
RDW: 14.6 % (ref 11.0–14.6)
nRBC: 0 % (ref 0–0)

## 2010-12-04 ENCOUNTER — Encounter (HOSPITAL_BASED_OUTPATIENT_CLINIC_OR_DEPARTMENT_OTHER): Payer: Medicare Other | Admitting: Oncology

## 2010-12-04 ENCOUNTER — Other Ambulatory Visit (HOSPITAL_COMMUNITY): Payer: Self-pay | Admitting: Oncology

## 2010-12-04 DIAGNOSIS — N189 Chronic kidney disease, unspecified: Secondary | ICD-10-CM

## 2010-12-04 DIAGNOSIS — D473 Essential (hemorrhagic) thrombocythemia: Secondary | ICD-10-CM

## 2010-12-04 DIAGNOSIS — N039 Chronic nephritic syndrome with unspecified morphologic changes: Secondary | ICD-10-CM

## 2010-12-04 LAB — CBC WITH DIFFERENTIAL/PLATELET
BASO%: 0.8 % (ref 0.0–2.0)
MCHC: 33.3 g/dL (ref 32.0–36.0)
MONO#: 0.4 10*3/uL (ref 0.1–0.9)
RBC: 3.09 10*6/uL — ABNORMAL LOW (ref 4.20–5.82)
WBC: 3.9 10*3/uL — ABNORMAL LOW (ref 4.0–10.3)
lymph#: 0.8 10*3/uL — ABNORMAL LOW (ref 0.9–3.3)

## 2010-12-25 ENCOUNTER — Encounter (HOSPITAL_BASED_OUTPATIENT_CLINIC_OR_DEPARTMENT_OTHER): Payer: Medicare Other | Admitting: Oncology

## 2010-12-25 ENCOUNTER — Other Ambulatory Visit (HOSPITAL_COMMUNITY): Payer: Self-pay | Admitting: Oncology

## 2010-12-25 DIAGNOSIS — N189 Chronic kidney disease, unspecified: Secondary | ICD-10-CM

## 2010-12-25 DIAGNOSIS — D473 Essential (hemorrhagic) thrombocythemia: Secondary | ICD-10-CM

## 2010-12-25 DIAGNOSIS — D631 Anemia in chronic kidney disease: Secondary | ICD-10-CM

## 2010-12-25 LAB — CBC WITH DIFFERENTIAL/PLATELET
BASO%: 0.7 % (ref 0.0–2.0)
Basophils Absolute: 0 10*3/uL (ref 0.0–0.1)
HCT: 27.5 % — ABNORMAL LOW (ref 38.4–49.9)
HGB: 8.9 g/dL — ABNORMAL LOW (ref 13.0–17.1)
MCHC: 32.4 g/dL (ref 32.0–36.0)
MONO#: 0.4 10*3/uL (ref 0.1–0.9)
NEUT%: 68 % (ref 39.0–75.0)
RDW: 14.1 % (ref 11.0–14.6)
WBC: 4.5 10*3/uL (ref 4.0–10.3)
lymph#: 0.9 10*3/uL (ref 0.9–3.3)

## 2011-01-15 ENCOUNTER — Other Ambulatory Visit (HOSPITAL_COMMUNITY): Payer: Self-pay | Admitting: Oncology

## 2011-01-15 ENCOUNTER — Ambulatory Visit (HOSPITAL_BASED_OUTPATIENT_CLINIC_OR_DEPARTMENT_OTHER): Payer: Medicare Other | Admitting: Oncology

## 2011-01-15 DIAGNOSIS — D649 Anemia, unspecified: Secondary | ICD-10-CM

## 2011-01-15 DIAGNOSIS — N189 Chronic kidney disease, unspecified: Secondary | ICD-10-CM

## 2011-01-15 DIAGNOSIS — D631 Anemia in chronic kidney disease: Secondary | ICD-10-CM

## 2011-01-15 DIAGNOSIS — D509 Iron deficiency anemia, unspecified: Secondary | ICD-10-CM

## 2011-01-15 DIAGNOSIS — N289 Disorder of kidney and ureter, unspecified: Secondary | ICD-10-CM

## 2011-01-15 DIAGNOSIS — D473 Essential (hemorrhagic) thrombocythemia: Secondary | ICD-10-CM

## 2011-01-15 LAB — COMPREHENSIVE METABOLIC PANEL
ALT: 7 U/L (ref 0–53)
AST: 12 U/L (ref 0–37)
Albumin: 3.6 g/dL (ref 3.5–5.2)
Alkaline Phosphatase: 74 U/L (ref 39–117)
Glucose, Bld: 95 mg/dL (ref 70–99)
Potassium: 5 mEq/L (ref 3.5–5.3)
Sodium: 136 mEq/L (ref 135–145)
Total Bilirubin: 0.2 mg/dL — ABNORMAL LOW (ref 0.3–1.2)
Total Protein: 6.6 g/dL (ref 6.0–8.3)

## 2011-01-15 LAB — CBC WITH DIFFERENTIAL/PLATELET
Basophils Absolute: 0 10*3/uL (ref 0.0–0.1)
Eosinophils Absolute: 0.1 10*3/uL (ref 0.0–0.5)
HGB: 9.1 g/dL — ABNORMAL LOW (ref 13.0–17.1)
MCV: 91.8 fL (ref 79.3–98.0)
MONO#: 0.3 10*3/uL (ref 0.1–0.9)
MONO%: 8.1 % (ref 0.0–14.0)
NEUT#: 2.6 10*3/uL (ref 1.5–6.5)
RDW: 15.1 % — ABNORMAL HIGH (ref 11.0–14.6)
WBC: 3.9 10*3/uL — ABNORMAL LOW (ref 4.0–10.3)

## 2011-01-15 LAB — LACTATE DEHYDROGENASE: LDH: 171 U/L (ref 94–250)

## 2011-01-18 LAB — FERRITIN: Ferritin: 26 ng/mL (ref 22–322)

## 2011-01-18 LAB — IRON AND TIBC: UIBC: 224 ug/dL

## 2011-02-06 ENCOUNTER — Other Ambulatory Visit (HOSPITAL_COMMUNITY): Payer: Self-pay | Admitting: Oncology

## 2011-02-06 ENCOUNTER — Encounter (HOSPITAL_BASED_OUTPATIENT_CLINIC_OR_DEPARTMENT_OTHER): Payer: Medicare Other | Admitting: Oncology

## 2011-02-06 DIAGNOSIS — N189 Chronic kidney disease, unspecified: Secondary | ICD-10-CM

## 2011-02-06 DIAGNOSIS — D473 Essential (hemorrhagic) thrombocythemia: Secondary | ICD-10-CM

## 2011-02-06 LAB — CBC WITH DIFFERENTIAL/PLATELET
BASO%: 0.6 % (ref 0.0–2.0)
EOS%: 3.1 % (ref 0.0–7.0)
LYMPH%: 19.1 % (ref 14.0–49.0)
MCH: 31.8 pg (ref 27.2–33.4)
MCHC: 34.4 g/dL (ref 32.0–36.0)
MONO#: 0.3 10*3/uL (ref 0.1–0.9)
MONO%: 6.9 % (ref 0.0–14.0)
Platelets: 100 10*3/uL — ABNORMAL LOW (ref 140–400)
RBC: 3.17 10*6/uL — ABNORMAL LOW (ref 4.20–5.82)
WBC: 4.2 10*3/uL (ref 4.0–10.3)

## 2011-02-06 LAB — FERRITIN: Ferritin: 56 ng/mL (ref 22–322)

## 2011-02-26 ENCOUNTER — Encounter (HOSPITAL_BASED_OUTPATIENT_CLINIC_OR_DEPARTMENT_OTHER): Payer: Medicare Other | Admitting: Oncology

## 2011-02-26 ENCOUNTER — Other Ambulatory Visit (HOSPITAL_COMMUNITY): Payer: Self-pay | Admitting: Oncology

## 2011-02-26 DIAGNOSIS — D473 Essential (hemorrhagic) thrombocythemia: Secondary | ICD-10-CM

## 2011-02-26 DIAGNOSIS — N039 Chronic nephritic syndrome with unspecified morphologic changes: Secondary | ICD-10-CM

## 2011-02-26 DIAGNOSIS — N189 Chronic kidney disease, unspecified: Secondary | ICD-10-CM

## 2011-02-26 DIAGNOSIS — D631 Anemia in chronic kidney disease: Secondary | ICD-10-CM

## 2011-02-26 LAB — CBC WITH DIFFERENTIAL/PLATELET
BASO%: 0.5 % (ref 0.0–2.0)
EOS%: 4.1 % (ref 0.0–7.0)
HGB: 8.4 g/dL — ABNORMAL LOW (ref 13.0–17.1)
MCH: 30.3 pg (ref 27.2–33.4)
MCHC: 33.2 g/dL (ref 32.0–36.0)
MONO%: 11 % (ref 0.0–14.0)
RBC: 2.77 10*6/uL — ABNORMAL LOW (ref 4.20–5.82)
RDW: 14.3 % (ref 11.0–14.6)
lymph#: 0.7 10*3/uL — ABNORMAL LOW (ref 0.9–3.3)
nRBC: 0 % (ref 0–0)

## 2011-03-05 LAB — CBC
HCT: 34.7 — ABNORMAL LOW
Hemoglobin: 11.5 — ABNORMAL LOW
MCHC: 33.1
MCV: 100.3 — ABNORMAL HIGH
Platelets: 233
RBC: 3.46 — ABNORMAL LOW
RDW: 14.1
WBC: 6.5

## 2011-03-05 LAB — POCT I-STAT, CHEM 8
Calcium, Ion: 1.19
Creatinine, Ser: 2.7 — ABNORMAL HIGH
Glucose, Bld: 111 — ABNORMAL HIGH
Hemoglobin: 11.9 — ABNORMAL LOW
Sodium: 133 — ABNORMAL LOW
TCO2: 23

## 2011-03-05 LAB — DIFFERENTIAL
Eosinophils Absolute: 0.2
Eosinophils Relative: 3
Lymphs Abs: 0.5 — ABNORMAL LOW
Monocytes Absolute: 0.6
Monocytes Relative: 9

## 2011-03-06 LAB — COMPREHENSIVE METABOLIC PANEL
ALT: 10
Alkaline Phosphatase: 80
BUN: 61 — ABNORMAL HIGH
CO2: 27
Calcium: 9.2
GFR calc non Af Amer: 35 — ABNORMAL LOW
Glucose, Bld: 90
Total Protein: 6

## 2011-03-06 LAB — CROSSMATCH

## 2011-03-06 LAB — URINE MICROSCOPIC-ADD ON

## 2011-03-06 LAB — DIFFERENTIAL
Basophils Relative: 1
Eosinophils Absolute: 0.1
Lymphs Abs: 0.9
Monocytes Relative: 8
Neutro Abs: 3.3
Neutrophils Relative %: 69

## 2011-03-06 LAB — CULTURE, BLOOD (ROUTINE X 2)
Culture: NO GROWTH
Culture: NO GROWTH

## 2011-03-06 LAB — URINALYSIS, ROUTINE W REFLEX MICROSCOPIC
Bilirubin Urine: NEGATIVE
Glucose, UA: NEGATIVE
Hgb urine dipstick: NEGATIVE
Ketones, ur: NEGATIVE
Nitrite: NEGATIVE
Protein, ur: NEGATIVE
Specific Gravity, Urine: 1.011
Urobilinogen, UA: 0.2
pH: 5.5

## 2011-03-06 LAB — CBC
HCT: 28 — ABNORMAL LOW
HCT: 32.3 — ABNORMAL LOW
Hemoglobin: 11 — ABNORMAL LOW
Hemoglobin: 8.4 — ABNORMAL LOW
Hemoglobin: 9.1 — ABNORMAL LOW
MCHC: 33.9
MCHC: 34
MCHC: 34
MCV: 96.5
MCV: 97.6
Platelets: 157
Platelets: 198
RBC: 2.54 — ABNORMAL LOW
RBC: 3.29 — ABNORMAL LOW
RDW: 13.6
RDW: 13.9
RDW: 14.7
WBC: 10.1

## 2011-03-06 LAB — RENAL FUNCTION PANEL
Albumin: 2.8 — ABNORMAL LOW
Calcium: 8.5
Chloride: 100
Creatinine, Ser: 1.6 — ABNORMAL HIGH
GFR calc Af Amer: 51 — ABNORMAL LOW
GFR calc non Af Amer: 42 — ABNORMAL LOW
Phosphorus: 2.7

## 2011-03-06 LAB — PROTIME-INR
INR: 1.1
INR: 1.4
Prothrombin Time: 14.8
Prothrombin Time: 17.9 — ABNORMAL HIGH

## 2011-03-06 LAB — BASIC METABOLIC PANEL
BUN: 55 — ABNORMAL HIGH
CO2: 27
CO2: 29
Chloride: 102
Chloride: 103
Creatinine, Ser: 1.79 — ABNORMAL HIGH
Creatinine, Ser: 1.89 — ABNORMAL HIGH
GFR calc Af Amer: 45 — ABNORMAL LOW
Glucose, Bld: 121 — ABNORMAL HIGH
Potassium: 3.8
Sodium: 137

## 2011-03-06 LAB — POCT I-STAT 4, (NA,K, GLUC, HGB,HCT)
Hemoglobin: 10.5 — ABNORMAL LOW
Potassium: 4
Sodium: 137

## 2011-03-06 LAB — TYPE AND SCREEN
ABO/RH(D): A POS
Antibody Screen: NEGATIVE

## 2011-03-06 LAB — URINE CULTURE: Culture: NO GROWTH

## 2011-03-06 LAB — APTT: aPTT: 28

## 2011-03-06 LAB — ABO/RH: ABO/RH(D): A POS

## 2011-03-09 LAB — CROSSMATCH

## 2011-03-13 ENCOUNTER — Encounter: Payer: Self-pay | Admitting: Medical Oncology

## 2011-03-19 ENCOUNTER — Other Ambulatory Visit (HOSPITAL_COMMUNITY): Payer: Self-pay | Admitting: Oncology

## 2011-03-19 ENCOUNTER — Encounter (HOSPITAL_BASED_OUTPATIENT_CLINIC_OR_DEPARTMENT_OTHER): Payer: Medicare Other | Admitting: Oncology

## 2011-03-19 DIAGNOSIS — N189 Chronic kidney disease, unspecified: Secondary | ICD-10-CM

## 2011-03-19 DIAGNOSIS — D631 Anemia in chronic kidney disease: Secondary | ICD-10-CM

## 2011-03-19 DIAGNOSIS — D473 Essential (hemorrhagic) thrombocythemia: Secondary | ICD-10-CM

## 2011-03-19 LAB — CBC WITH DIFFERENTIAL/PLATELET
BASO%: 0.5 % (ref 0.0–2.0)
HCT: 25.7 % — ABNORMAL LOW (ref 38.4–49.9)
LYMPH%: 17.2 % (ref 14.0–49.0)
MCHC: 31.9 g/dL — ABNORMAL LOW (ref 32.0–36.0)
MCV: 94.5 fL (ref 79.3–98.0)
MONO%: 15 % — ABNORMAL HIGH (ref 0.0–14.0)
NEUT%: 62.8 % (ref 39.0–75.0)
Platelets: 308 10*3/uL (ref 140–400)
RBC: 2.72 10*6/uL — ABNORMAL LOW (ref 4.20–5.82)
nRBC: 0 % (ref 0–0)

## 2011-03-24 ENCOUNTER — Encounter: Payer: Self-pay | Admitting: Oncology

## 2011-03-24 DIAGNOSIS — D473 Essential (hemorrhagic) thrombocythemia: Secondary | ICD-10-CM | POA: Insufficient documentation

## 2011-03-24 DIAGNOSIS — H348122 Central retinal vein occlusion, left eye, stable: Secondary | ICD-10-CM | POA: Insufficient documentation

## 2011-03-24 DIAGNOSIS — D631 Anemia in chronic kidney disease: Secondary | ICD-10-CM

## 2011-03-24 HISTORY — DX: Anemia in chronic kidney disease: D63.1

## 2011-03-24 NOTE — Progress Notes (Signed)
  Abstraction only. No charge.

## 2011-04-10 ENCOUNTER — Ambulatory Visit (HOSPITAL_BASED_OUTPATIENT_CLINIC_OR_DEPARTMENT_OTHER): Payer: Medicare Other | Admitting: Oncology

## 2011-04-10 ENCOUNTER — Other Ambulatory Visit (HOSPITAL_COMMUNITY): Payer: Self-pay | Admitting: Oncology

## 2011-04-10 ENCOUNTER — Other Ambulatory Visit (HOSPITAL_BASED_OUTPATIENT_CLINIC_OR_DEPARTMENT_OTHER): Payer: Medicare Other | Admitting: Lab

## 2011-04-10 ENCOUNTER — Telehealth: Payer: Self-pay | Admitting: Oncology

## 2011-04-10 VITALS — BP 150/56 | HR 62 | Temp 97.5°F | Resp 20 | Wt 156.8 lb

## 2011-04-10 DIAGNOSIS — D631 Anemia in chronic kidney disease: Secondary | ICD-10-CM

## 2011-04-10 DIAGNOSIS — D473 Essential (hemorrhagic) thrombocythemia: Secondary | ICD-10-CM

## 2011-04-10 DIAGNOSIS — N189 Chronic kidney disease, unspecified: Secondary | ICD-10-CM

## 2011-04-10 DIAGNOSIS — N039 Chronic nephritic syndrome with unspecified morphologic changes: Secondary | ICD-10-CM

## 2011-04-10 LAB — COMPREHENSIVE METABOLIC PANEL
ALT: 9 U/L (ref 0–53)
AST: 14 U/L (ref 0–37)
Alkaline Phosphatase: 90 U/L (ref 39–117)
BUN: 91 mg/dL — ABNORMAL HIGH (ref 6–23)
Calcium: 9.6 mg/dL (ref 8.4–10.5)
Chloride: 100 mEq/L (ref 96–112)
Creatinine, Ser: 2.58 mg/dL — ABNORMAL HIGH (ref 0.50–1.35)
Total Bilirubin: 0.2 mg/dL — ABNORMAL LOW (ref 0.3–1.2)

## 2011-04-10 LAB — IRON AND TIBC
TIBC: 292 ug/dL (ref 215–435)
UIBC: 250 ug/dL (ref 125–400)

## 2011-04-10 LAB — CBC WITH DIFFERENTIAL/PLATELET
BASO%: 1 % (ref 0.0–2.0)
Basophils Absolute: 0 10*3/uL (ref 0.0–0.1)
EOS%: 5.3 % (ref 0.0–7.0)
HCT: 26.5 % — ABNORMAL LOW (ref 38.4–49.9)
MCH: 32 pg (ref 27.2–33.4)
MCHC: 34.1 g/dL (ref 32.0–36.0)
MCV: 93.8 fL (ref 79.3–98.0)
MONO%: 14.3 % — ABNORMAL HIGH (ref 0.0–14.0)
NEUT%: 63.5 % (ref 39.0–75.0)
lymph#: 0.5 10*3/uL — ABNORMAL LOW (ref 0.9–3.3)

## 2011-04-10 LAB — FERRITIN: Ferritin: 55 ng/mL (ref 22–322)

## 2011-04-10 MED ORDER — DARBEPOETIN ALFA-POLYSORBATE 500 MCG/ML IJ SOLN
300.0000 ug | Freq: Once | INTRAMUSCULAR | Status: AC
  Administered 2011-04-10: 300 ug via SUBCUTANEOUS

## 2011-04-10 NOTE — Progress Notes (Signed)
  This office note has been dictated.  #[id number]

## 2011-04-10 NOTE — Progress Notes (Signed)
CC:   Bertram Millard. [name], M.D. [name]. [name], M.D. [name], M.D. [name], M.D.  HISTORY OF PRESENT ILLNESS:  [name] was seen today for followup of his central thrombocythemia as well as the anemia of chronic renal disease.  The patient's diagnosis of essential thrombocythemia goes back to September 20, 1993.  [name] was last seen by [place] on 01/15/2011. Shortly after that visit, we got back labs indicating that the ferritin had dropped dramatically down to 26 from 114 on 10/23/2010, and 154 on 08/01/2010.  We were going to check the stools for occult blood.  I do not think that was done recently.  We do have stools that were checked for occult blood dating back to December 2009 that were negative x3. The patient was placed on ferrous sulfate 325 mg twice a day.  His stools are dark.  There has been no obvious blood in the stools.  On 02/26/2011, the patient's platelet count.  Came back 105,000.  Platelets have been running in about the 100,000 range recently.  We decided to drop down the anagrelide from 1 mg twice a day to 1 mg daily.  The patient was also on hydroxyurea 500 mg Mondays and Thursdays and aspirin 81 mg daily.  He gets Aranesp every 3 weeks for hemoglobin less than or equal to 10.  He  got Aranesp most recently on 03/19/2011 and on 02/26/2011.  His hemoglobin on 09/24 was 8.4 and on 10/15 was 8.2.  The patient states that he has noticeable fatigue when his hemoglobin is in the 8 range.  He likes to walk and was having some difficulty with that. He has suggested that we set the hemoglobin cut off a little higher.  In the past, we were concerned about having his hemoglobin too high.  He had been having some chest pain and coronary insufficiency when we got his hemoglobin into the normal range.  That has not happened in some time.  All things considered, [name] appears to be doing reasonably well.  I noticed that he has lost a little  weight.  MEDICATIONS:  Medicines are reviewed and recorded.  We are going to make some changes as follows:  The anagrelide will be increased from 1 mg a day to 1 mg twice a day.  We are also going to cut back on the hydroxyurea from 500 mg twice a week to 500 mg once a week.  The patient will stay on aspirin 81 mg daily, and we will continue the Aranesp every 3 weeks for hemoglobin less than 11.  Other medicines are reviewed and recorded.  PHYSICAL EXAMINATION:  General:  [name] looks about the same.  His weight, however, is 157 pounds, down from 161 pounds on 08/13.  Height 73 inches.  Vital Signs:  Blood pressure today 150/56.  Other vital signs are normal.  HEENT:  There is no scleral icterus.  Mouth and pharynx are benign.  Lymphatics:  There is no peripheral adenopathy palpable.  Lungs:  Clear to percussion and auscultation.  Cardiac: Regular rhythm with soft systolic ejection murmur heard.  Abdomen: Benign with no organomegaly or masses palpable.  No splenomegaly. Extremities:  No peripheral edema.  LABORATORY DATA:  Today white count 3.1, ANC 2.0, hemoglobin 9.0, hematocrit 26.5 and platelets 360,000.  Chemistries notable for an albumin of 3.3, BUN 91, creatinine 2.58, up from 2.17 on 08/13, also a BUN of 74 on 08/13.  BUN and creatinine seem to be  running a little higher than they have in the past.  Iron studies are pending.  Iron studies from 02/06/2011:  The ferritin was 56.  On 08/13, ferritin was 26, iron saturation 22%.  IMPRESSION AND PLAN:  Clinically, [name] is stable.  He will receive Aranesp today 300 mcg subcutaneously.  We will continue to check CBCs every 3 weeks and give him Aranesp 300 mcg subcu whenever the hemoglobin is less than or equal to 11.  I am concerned about the rise in his BUN and creatinine.  We need to send these results to Dr. Arrie Aran.  As stated, we are going to cut back on the hydroxyurea 500 mg weekly, increase the anagrelide 1 mg  twice a day, and keep the aspirin at 81 mg daily.  We will plan to see [name] again in approximately 9 weeks which will correspond to February 19th.  We will check CBC, chemistries, and iron studies at that time.    ______________________________ Samul Dada, M.D. DSM/MEDQ  D:  04/10/2011  T:  04/10/2011  Job:  [id number]

## 2011-04-10 NOTE — Telephone Encounter (Signed)
LMONVM FOR PT THAT HE APPTS ARE COMPLETE. ADDED APPTS FOR JAN/FEB. PT TO GET SCHEDULE @ NEXT APPT FOR 11/26.

## 2011-04-11 ENCOUNTER — Telehealth: Payer: Self-pay | Admitting: *Deleted

## 2011-04-11 ENCOUNTER — Telehealth: Payer: Self-pay | Admitting: Medical Oncology

## 2011-04-11 NOTE — Telephone Encounter (Signed)
I called pt to let him know that I notified the schedulers to move his appointments to Monday. He voiced understanding.

## 2011-04-11 NOTE — Telephone Encounter (Signed)
Pt called left a message  Stating he  needs to move his Tues appointments to [facility].

## 2011-04-25 ENCOUNTER — Other Ambulatory Visit: Payer: Self-pay | Admitting: Oncology

## 2011-04-30 ENCOUNTER — Other Ambulatory Visit (HOSPITAL_BASED_OUTPATIENT_CLINIC_OR_DEPARTMENT_OTHER): Payer: Medicare Other

## 2011-04-30 ENCOUNTER — Ambulatory Visit (HOSPITAL_BASED_OUTPATIENT_CLINIC_OR_DEPARTMENT_OTHER): Payer: Medicare Other

## 2011-04-30 VITALS — BP 133/71 | HR 67 | Temp 97.4°F

## 2011-04-30 DIAGNOSIS — N189 Chronic kidney disease, unspecified: Secondary | ICD-10-CM

## 2011-04-30 DIAGNOSIS — N039 Chronic nephritic syndrome with unspecified morphologic changes: Secondary | ICD-10-CM

## 2011-04-30 DIAGNOSIS — D631 Anemia in chronic kidney disease: Secondary | ICD-10-CM

## 2011-04-30 LAB — CBC WITH DIFFERENTIAL/PLATELET
Basophils Absolute: 0 10*3/uL (ref 0.0–0.1)
LYMPH%: 18.1 % (ref 14.0–49.0)
MCH: 31.2 pg (ref 27.2–33.4)
MCHC: 33.1 g/dL (ref 32.0–36.0)
MCV: 94.3 fL (ref 79.3–98.0)
NEUT%: 64.9 % (ref 39.0–75.0)
RBC: 2.98 10*6/uL — ABNORMAL LOW (ref 4.20–5.82)
RDW: 14.6 % (ref 11.0–14.6)

## 2011-04-30 MED ORDER — DARBEPOETIN ALFA-POLYSORBATE 500 MCG/ML IJ SOLN
300.0000 ug | Freq: Once | INTRAMUSCULAR | Status: AC
  Administered 2011-04-30: 300 ug via SUBCUTANEOUS
  Filled 2011-04-30: qty 1

## 2011-05-10 ENCOUNTER — Other Ambulatory Visit: Payer: Self-pay | Admitting: Cardiology

## 2011-05-10 ENCOUNTER — Other Ambulatory Visit: Payer: Self-pay | Admitting: *Deleted

## 2011-05-21 ENCOUNTER — Other Ambulatory Visit (HOSPITAL_COMMUNITY): Payer: Self-pay | Admitting: Oncology

## 2011-05-21 ENCOUNTER — Ambulatory Visit (HOSPITAL_BASED_OUTPATIENT_CLINIC_OR_DEPARTMENT_OTHER): Payer: Medicare Other

## 2011-05-21 ENCOUNTER — Other Ambulatory Visit: Payer: Medicare Other | Admitting: Lab

## 2011-05-21 VITALS — BP 120/78 | HR 74 | Temp 97.8°F

## 2011-05-21 DIAGNOSIS — D473 Essential (hemorrhagic) thrombocythemia: Secondary | ICD-10-CM

## 2011-05-21 DIAGNOSIS — D631 Anemia in chronic kidney disease: Secondary | ICD-10-CM

## 2011-05-21 DIAGNOSIS — N039 Chronic nephritic syndrome with unspecified morphologic changes: Secondary | ICD-10-CM

## 2011-05-21 DIAGNOSIS — N189 Chronic kidney disease, unspecified: Secondary | ICD-10-CM

## 2011-05-21 LAB — CBC WITH DIFFERENTIAL/PLATELET
Basophils Absolute: 0 10*3/uL (ref 0.0–0.1)
Eosinophils Absolute: 0.1 10*3/uL (ref 0.0–0.5)
HGB: 10.2 g/dL — ABNORMAL LOW (ref 13.0–17.1)
MONO#: 0.4 10*3/uL (ref 0.1–0.9)
NEUT#: 3 10*3/uL (ref 1.5–6.5)
RBC: 3.27 10*6/uL — ABNORMAL LOW (ref 4.20–5.82)
RDW: 14.1 % (ref 11.0–14.6)
WBC: 4.3 10*3/uL (ref 4.0–10.3)
lymph#: 0.8 10*3/uL — ABNORMAL LOW (ref 0.9–3.3)

## 2011-05-21 MED ORDER — DARBEPOETIN ALFA-POLYSORBATE 500 MCG/ML IJ SOLN
300.0000 ug | Freq: Once | INTRAMUSCULAR | Status: AC
  Administered 2011-05-21: 300 ug via SUBCUTANEOUS
  Filled 2011-05-21: qty 1

## 2011-06-11 ENCOUNTER — Other Ambulatory Visit (HOSPITAL_COMMUNITY): Payer: Self-pay | Admitting: Oncology

## 2011-06-11 ENCOUNTER — Ambulatory Visit (HOSPITAL_BASED_OUTPATIENT_CLINIC_OR_DEPARTMENT_OTHER): Payer: Medicare Other

## 2011-06-11 ENCOUNTER — Other Ambulatory Visit (HOSPITAL_BASED_OUTPATIENT_CLINIC_OR_DEPARTMENT_OTHER): Payer: Medicare Other | Admitting: Lab

## 2011-06-11 VITALS — BP 136/67 | HR 68 | Temp 98.1°F

## 2011-06-11 DIAGNOSIS — D631 Anemia in chronic kidney disease: Secondary | ICD-10-CM

## 2011-06-11 DIAGNOSIS — D473 Essential (hemorrhagic) thrombocythemia: Secondary | ICD-10-CM

## 2011-06-11 DIAGNOSIS — N189 Chronic kidney disease, unspecified: Secondary | ICD-10-CM

## 2011-06-11 LAB — CBC WITH DIFFERENTIAL/PLATELET
Basophils Absolute: 0 10*3/uL (ref 0.0–0.1)
Eosinophils Absolute: 0.2 10*3/uL (ref 0.0–0.5)
HCT: 31.4 % — ABNORMAL LOW (ref 38.4–49.9)
HGB: 10.4 g/dL — ABNORMAL LOW (ref 13.0–17.1)
LYMPH%: 18.1 % (ref 14.0–49.0)
MCV: 93.2 fL (ref 79.3–98.0)
MONO#: 0.4 10*3/uL (ref 0.1–0.9)
NEUT#: 2.9 10*3/uL (ref 1.5–6.5)
Platelets: 168 10*3/uL (ref 140–400)
RBC: 3.38 10*6/uL — ABNORMAL LOW (ref 4.20–5.82)
WBC: 4.3 10*3/uL (ref 4.0–10.3)

## 2011-06-11 MED ORDER — DARBEPOETIN ALFA-POLYSORBATE 500 MCG/ML IJ SOLN
300.0000 ug | Freq: Once | INTRAMUSCULAR | Status: AC
  Administered 2011-06-11: 300 ug via SUBCUTANEOUS
  Filled 2011-06-11: qty 1

## 2011-06-18 ENCOUNTER — Encounter: Payer: Self-pay | Admitting: Cardiology

## 2011-06-18 ENCOUNTER — Telehealth: Payer: Self-pay | Admitting: Cardiology

## 2011-06-18 ENCOUNTER — Ambulatory Visit (INDEPENDENT_AMBULATORY_CARE_PROVIDER_SITE_OTHER): Payer: Medicare Other | Admitting: Cardiology

## 2011-06-18 DIAGNOSIS — N189 Chronic kidney disease, unspecified: Secondary | ICD-10-CM

## 2011-06-18 DIAGNOSIS — R011 Cardiac murmur, unspecified: Secondary | ICD-10-CM | POA: Insufficient documentation

## 2011-06-18 DIAGNOSIS — I2510 Atherosclerotic heart disease of native coronary artery without angina pectoris: Secondary | ICD-10-CM

## 2011-06-18 DIAGNOSIS — H349 Unspecified retinal vascular occlusion: Secondary | ICD-10-CM

## 2011-06-18 DIAGNOSIS — H348122 Central retinal vein occlusion, left eye, stable: Secondary | ICD-10-CM

## 2011-06-18 DIAGNOSIS — I25810 Atherosclerosis of coronary artery bypass graft(s) without angina pectoris: Secondary | ICD-10-CM

## 2011-06-18 NOTE — Telephone Encounter (Signed)
I talked with [name]. She received progress note on pt. This was done in error and she will discard the note.

## 2011-06-18 NOTE — Progress Notes (Signed)
PCP: Dr. [name]  78 yo with history of CAD s/p CABG, CKD, and essential thrombocythemia presents for cardiology followup.  He has been seen by Dr. [name] in the past and is seeing me for the first time today.  He had CABG in 2006 and has had no catheterization since that time.  Soon after CABG, he had an episode of atrial flutter and was cardioverted to NSR.  He is no longer on coumadin.  He has had no documented recurrence of atrial arrhythmias.  He denies exertional dyspnea.  He walks for exercise and has no problems going up a hill near his house.  No orthopnea or PND.  No chest pain.  Per patient, renal function has remained stable.  He continues to followup twice a year with Dr. Arrie Aran.  He sees Dr. Arline Asp for essential thrombocythemia.  SBP at home has been running in the 120s-140s range.   Labs (11/12): K 4.9, creatinine 2.58 Labs (1/13): HCT 31.4, plts 168, WBC 4.3  ECG: NSR, 1st degree AV block (232 msec).   PMH: 1. Essential thrombocythemia: Diagnosed in 1995, followed by Dr. Arline Asp. 2. CKD: ? Cause.  Has been stable recently.  Followed by Dr. Arrie Aran.  3. Anemia of CKD 4. GERD 5. TKR 12/09.  6. H/o TIAs 7. HTN: ? TIAs related to amlodipine use (cannot take amlodipine).  8. Atrial flutter: Occurred soon after CABG in 2006.  He was cardioverted to NSR and has had no recurrence.  He is not on coumadin.  9. CAD s/p CABG in 2006 with LIMA-LAD, seq SVG-OM1 and OM2, SVG-RCA, SVG-D.   Last EF evaluation was by TEE in 2006 with EF 55% and trivial AI.   SH: Retired from [employer], quit smoking at age 51, Prior heavy ETOH but none now.  Married, has disabled son who lives with him.   FH: Father with ? MI at age 40.   ROS: All systems reviewed and negative except as per HPI.   Current Outpatient Prescriptions  Medication Sig Dispense Refill  . acyclovir (ZOVIRAX) 200 MG capsule Take 1 tablet by mouth Daily.      Marland Kitchen anagrelide (AGRYLIN) 1 MG capsule Take 1 mg by mouth 2  (two) times daily.       Marland Kitchen aspirin 81 MG tablet Take 81 mg by mouth daily.        . cloNIDine (CATAPRES) 0.3 MG tablet Take 0.3 mg by mouth 3 (three) times daily.        . furosemide (LASIX) 40 MG tablet Take 60 mg by mouth daily.       . hydroxyurea (HYDREA) 500 MG capsule Take 500 mg by mouth once a week.       . iron polysaccharides (NIFEREX) 150 MG capsule Take 150 mg by mouth daily.        Marland Kitchen JALYN 0.5-0.4 MG CAPS Take 1 capsule by mouth Daily.      . metoprolol (TOPROL-XL) 100 MG 24 hr tablet take 1 tablet by mouth twice a day  60 tablet  PRN  . pantoprazole (PROTONIX) 40 MG tablet Take 1 tablet by mouth Daily.      . psyllium (METAMUCIL) 58.6 % powder Take 1 packet by mouth as needed.        . triamcinolone cream (KENALOG) 0.1 % as directed.      . zolpidem (AMBIEN) 5 MG tablet Take 1 tablet by mouth as needed.        BP 138/66  Pulse  66  Ht 5\' 10"  (1.778 m)  Wt 74.844 kg (165 lb)  BMI 23.68 kg/m2 General: NAD Neck: No JVD, no thyromegaly or thyroid nodule.  Lungs: Clear to auscultation bilaterally with normal respiratory effort. CV: Nondisplaced PMI.  Heart regular S1/S2, no S3/S4, 1/6 SEM RUSB, 1/6 HSM apex.  No peripheral edema.  No carotid bruit.  Normal pedal pulses.  Abdomen: Soft, nontender, no hepatosplenomegaly, no distention.  Neurologic: Alert and oriented x 3.  Psych: Normal affect. Extremities: No clubbing or cyanosis.

## 2011-06-18 NOTE — Telephone Encounter (Signed)
A form was sent to them and they don't think they where to get it can you please contact them

## 2011-06-18 NOTE — Assessment & Plan Note (Signed)
Status post CABG with no ischemic symptoms.  Continue ASA, Toprol.  He should be on a statin.  I will check lipids to see where his LDL is to get an idea about what statin and which dose to use.

## 2011-06-18 NOTE — Assessment & Plan Note (Signed)
Soft systolic murmur at apex and at base.  I will get an echocardiogram to investigate.

## 2011-06-18 NOTE — Assessment & Plan Note (Signed)
Followed by Dr. Arrie Aran.  Has been stable.  I will get a BMET with lipid check.

## 2011-06-18 NOTE — Patient Instructions (Signed)
Your physician has requested that you have an echocardiogram. Echocardiography is a painless test that uses sound waves to create images of your heart. It provides your doctor with information about the size and shape of your heart and how well your heart's chambers and valves are working. This procedure takes approximately one hour. There are no restrictions for this procedure.  Your physician recommends that you return for a FASTING lipid profile /BMET 414.05   Your physician wants you to follow-up in: 1 year with Dr Shirlee Latch. (January 2014).You will receive a reminder letter in the mail two months in advance. If you don't receive a letter, please call our office to schedule the follow-up appointment.

## 2011-07-02 ENCOUNTER — Other Ambulatory Visit (HOSPITAL_BASED_OUTPATIENT_CLINIC_OR_DEPARTMENT_OTHER): Payer: Medicare Other | Admitting: Lab

## 2011-07-02 ENCOUNTER — Ambulatory Visit: Payer: Medicare Other

## 2011-07-02 DIAGNOSIS — D473 Essential (hemorrhagic) thrombocythemia: Secondary | ICD-10-CM

## 2011-07-02 DIAGNOSIS — D631 Anemia in chronic kidney disease: Secondary | ICD-10-CM

## 2011-07-02 LAB — CBC WITH DIFFERENTIAL/PLATELET
Basophils Absolute: 0 10*3/uL (ref 0.0–0.1)
Eosinophils Absolute: 0.1 10*3/uL (ref 0.0–0.5)
HCT: 34.3 % — ABNORMAL LOW (ref 38.4–49.9)
LYMPH%: 17.9 % (ref 14.0–49.0)
MONO#: 0.4 10*3/uL (ref 0.1–0.9)
NEUT#: 3.2 10*3/uL (ref 1.5–6.5)
NEUT%: 69.1 % (ref 39.0–75.0)
Platelets: 183 10*3/uL (ref 140–400)
WBC: 4.6 10*3/uL (ref 4.0–10.3)

## 2011-07-02 MED ORDER — DARBEPOETIN ALFA-POLYSORBATE 500 MCG/ML IJ SOLN: 300.0000 ug | Freq: Once | INTRAMUSCULAR | Status: DC

## 2011-07-09 ENCOUNTER — Other Ambulatory Visit (INDEPENDENT_AMBULATORY_CARE_PROVIDER_SITE_OTHER): Payer: Medicare Other | Admitting: *Deleted

## 2011-07-09 ENCOUNTER — Ambulatory Visit (HOSPITAL_COMMUNITY): Payer: Medicare Other | Attending: Cardiology | Admitting: Radiology

## 2011-07-09 DIAGNOSIS — I10 Essential (primary) hypertension: Secondary | ICD-10-CM | POA: Insufficient documentation

## 2011-07-09 DIAGNOSIS — R011 Cardiac murmur, unspecified: Secondary | ICD-10-CM

## 2011-07-09 DIAGNOSIS — I4892 Unspecified atrial flutter: Secondary | ICD-10-CM | POA: Insufficient documentation

## 2011-07-09 DIAGNOSIS — I25810 Atherosclerosis of coronary artery bypass graft(s) without angina pectoris: Secondary | ICD-10-CM

## 2011-07-09 DIAGNOSIS — I2510 Atherosclerotic heart disease of native coronary artery without angina pectoris: Secondary | ICD-10-CM

## 2011-07-09 DIAGNOSIS — Z8673 Personal history of transient ischemic attack (TIA), and cerebral infarction without residual deficits: Secondary | ICD-10-CM | POA: Insufficient documentation

## 2011-07-09 DIAGNOSIS — Z87891 Personal history of nicotine dependence: Secondary | ICD-10-CM | POA: Insufficient documentation

## 2011-07-09 LAB — LIPID PANEL: HDL: 34.8 mg/dL — ABNORMAL LOW (ref >39.00)

## 2011-07-09 LAB — BASIC METABOLIC PANEL
CO2: 24 mEq/L (ref 19–32)
Glucose, Bld: 98 mg/dL (ref 70–99)
Potassium: 5 mEq/L (ref 3.5–5.1)
Sodium: 137 mEq/L (ref 135–145)

## 2011-07-11 ENCOUNTER — Telehealth: Payer: Self-pay | Admitting: *Deleted

## 2011-07-11 DIAGNOSIS — I25810 Atherosclerosis of coronary artery bypass graft(s) without angina pectoris: Secondary | ICD-10-CM

## 2011-07-11 MED ORDER — ATORVASTATIN CALCIUM 20 MG PO TABS: 20.0000 mg | ORAL_TABLET | Freq: Every day | ORAL | Status: DC

## 2011-07-11 NOTE — Telephone Encounter (Signed)
Notes Recorded by Jacqlyn Krauss, RN on 07/11/2011 at 9:45 AM Discussed with pt. He agreed to begin atorvastatin 20mg  daily. He will return for fasting lipid/liver profile 09/10/11. ------  Notes Recorded by Marca Ancona, MD on 07/09/2011 at 4:31 PM Stable creatinine. LDL goal < 70. Would start atorvastatin 20 mg daily. Lipids/LFTs in 2 months.

## 2011-07-24 ENCOUNTER — Ambulatory Visit (HOSPITAL_BASED_OUTPATIENT_CLINIC_OR_DEPARTMENT_OTHER): Payer: Medicare Other | Admitting: Oncology

## 2011-07-24 ENCOUNTER — Other Ambulatory Visit: Payer: Medicare Other | Admitting: Lab

## 2011-07-24 ENCOUNTER — Encounter: Payer: Self-pay | Admitting: Oncology

## 2011-07-24 ENCOUNTER — Telehealth: Payer: Self-pay | Admitting: Oncology

## 2011-07-24 DIAGNOSIS — D631 Anemia in chronic kidney disease: Secondary | ICD-10-CM

## 2011-07-24 DIAGNOSIS — D473 Essential (hemorrhagic) thrombocythemia: Secondary | ICD-10-CM

## 2011-07-24 DIAGNOSIS — N039 Chronic nephritic syndrome with unspecified morphologic changes: Secondary | ICD-10-CM

## 2011-07-24 DIAGNOSIS — N189 Chronic kidney disease, unspecified: Secondary | ICD-10-CM

## 2011-07-24 LAB — IRON AND TIBC
%SAT: 30 % (ref 20–55)
Iron: 87 ug/dL (ref 42–165)
TIBC: 290 ug/dL (ref 215–435)

## 2011-07-24 LAB — COMPREHENSIVE METABOLIC PANEL
BUN: 87 mg/dL — ABNORMAL HIGH (ref 6–23)
CO2: 20 mEq/L (ref 19–32)
Calcium: 8.4 mg/dL (ref 8.4–10.5)
Chloride: 104 mEq/L (ref 96–112)
Creatinine, Ser: 2.3 mg/dL — ABNORMAL HIGH (ref 0.50–1.35)
Glucose, Bld: 113 mg/dL — ABNORMAL HIGH (ref 70–99)

## 2011-07-24 LAB — CBC WITH DIFFERENTIAL/PLATELET
Basophils Absolute: 0 10*3/uL (ref 0.0–0.1)
EOS%: 3.4 % (ref 0.0–7.0)
HGB: 9.7 g/dL — ABNORMAL LOW (ref 13.0–17.1)
LYMPH%: 16.2 % (ref 14.0–49.0)
MCH: 30.4 pg (ref 27.2–33.4)
MCV: 90.7 fL (ref 79.3–98.0)
MONO%: 8 % (ref 0.0–14.0)
RBC: 3.2 10*6/uL — ABNORMAL LOW (ref 4.20–5.82)
RDW: 13.6 % (ref 11.0–14.6)

## 2011-07-24 MED ORDER — DARBEPOETIN ALFA-POLYSORBATE 500 MCG/ML IJ SOLN
300.0000 ug | Freq: Once | INTRAMUSCULAR | Status: AC
  Administered 2011-07-24: 300 ug via SUBCUTANEOUS
  Filled 2011-07-24: qty 1

## 2011-07-24 NOTE — Progress Notes (Signed)
This office note has been dictated.  #[id number]

## 2011-07-24 NOTE — Telephone Encounter (Signed)
appts made and printed for 3/4/5 2013   aom 

## 2011-07-24 NOTE — Progress Notes (Signed)
CC:   Bryan Ancona, MD [name], M.D. [name], M.D.  PROBLEM LIST: 1. Essential thrombocythemia with diagnosis going back to April 1995.     JAK-2 mutation was absent on assay carried out on 03/23/2008.  Mild     splenomegaly was present on nuclear scan in April 1995.  The     patient has been treated with aspirin, hydroxyurea, anagrelide and     Aranesp. 2. History of retinal vein occlusion involving the left eye status     post left vitrectomy on 05/05/2003. 3. Coronary artery disease status post CABG x5 on 09/14/2007. 4. Renal insufficiency. 5. Hypertension. 6. Dyslipidemia. 7. History of esophageal stricture. 8. 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 9. Systolic ejection murmur. 10.Hearing impairment.  MEDICATIONS: 1. Zovirax 200 mg daily. 2. Anagrelide currently 1 mg twice daily. 3. Aspirin 81 mg daily. 4. Lipitor 20 mg daily. 5. Catapres 0.3 mg 3 times daily. 6. Lasix 60 mg daily. 7. Hydroxyurea 500 mg weekly. 8. Niferex 150 mg twice a day. 9. Toprol-XL 100 mg twice a day. 10.Protonix 40 mg daily. 11.Metamucil 1 packet as needed. 12.Flomax 0.4 mg daily. 13.Ambien 5 mg as needed. 14.Kenalog 0.1% as directed.  HISTORY:  [name] is now 78 years old.  He was last seen by [place] on 04/10/2011 for followup of his essential thrombocythemia, in addition to his anemia associated with chronic renal disease.  As stated, the patient's diagnosis of essential thrombocythemia goes back to April 1995.  Clinically, [name] is doing well.  In addition to the above medicines, he is also on Aranesp currently 300 mcg subcu every 3 weeks for hemoglobin less than or equal to 11.  He received Aranesp most recently on June 11, 2011, when his hemoglobin was 10.4, hematocrit 31.4.  On 01/28, hemoglobin was 11.2, and he did not receive Aranesp.  In recent months, we have a cut back on the hydroxyurea because  of thrombocytopenia.  Hopefully, this decrease in the hydroxyurea will also help with the anemia.  The patient's platelet count is under good control primarily with anagrelide currently 1 mg twice a day.  As stated, the patient is also on aspirin 81 mg daily.  The patient is on iron because of a fairly dramatic drop in his ferritin that occurred about 6 months ago.  About a year ago, ferritin had been running 154, and the ferritin dropped dramatically down to 26 in August.  We have asked the patient to bring in stool cards so we can check for occult blood.  He was given these cards several months ago.  He has assured [place] that he will try to bring these in.  The patient tolerates his anemia fairly well, but he did have an episode when the HGB got down to 8 range that he was quite symptomatic, although he denies having angina.  He got extremely weak and may have had presyncope.  Previously, we had set his threshold at about 10, but because of that episode we reset the threshold at 11 so that we would avoid severe drop in his hemoglobin and hematocrit.  On the other hand, the patient seems to tolerate hemoglobins of 9 and 10 fairly well.  He denies any major problems over the past 3 months.  He does bruise with trauma.  He denies any undue bleeding.  He has not had any thrombotic events.  PHYSICAL EXAMINATION:  General:  There is little change.  The patient is frail and elderly.  Weight is 163 pounds, up 6 pounds from 3 months ago. This is his baseline weight.  Height 5 feet 10 inches.  Vital Signs: Blood pressure currently is 168/64.  I believe the patient states he may not have taken all of his medicines for hypertension.  Pulse 59 and regular, respirations regular and unlabored.  He is afebrile.  HEENT: There is no scleral icterus.  Mouth and pharynx are benign.  Lymphatic: No peripheral adenopathy palpable.  Lungs:  Clear to percussion and auscultation.  Cardiac Exam:  Regular rhythm  with systolic ejection murmur.  Abdomen:  Without splenomegaly or masses.  Liver can be appreciated about 3 or 4 cm below the right costal margin.  Extremities: Puffy ankles with no peripheral edema.  Neurologic Exam:  Grossly normal.  LABORATORY DATA:  Today, white count 4.7, ANC 3.4, hemoglobin 9.7, hematocrit 29.0, platelets 143,000.  Chemistries and iron studies today are pending.  Chemistries from 04/10/2011 were notable for a BUN of 91, creatinine 2.58, albumin of 3.3.  Iron studies from 04/10/2011:  Iron saturation 14%, ferritin 55.  IMAGING STUDIES: 1. Chest x-ray, 2 views, from 04/13/2008 showed no active disease.     The patient was status post median sternotomy and CABG.  IMPRESSION AND PLAN:  [name] seems to be doing well.  He will receive Aranesp today 300 mcg subcu.  We will continue to monitor CBC every 3 weeks and administer Aranesp 300 mcg subcu whenever the hemoglobin is less than or equal to 11.  We will not make any changes in his current treatment program.  We are awaiting iron studies today.  I have asked him to bring in stool cards for occult blood.  The drop in his ferritin which occurred 6-9 months ago would suggest the possibility of some intermittent GI bleeding.  We will plan to see [name] again in 3 months, at which time we will check CBC, chemistries, and iron studies.    ______________________________ Samul Dada, M.D. DSM/MEDQ  D:  07/24/2011  T:  07/24/2011  Job:  [id number]

## 2011-07-30 ENCOUNTER — Other Ambulatory Visit: Payer: Self-pay | Admitting: Cardiology

## 2011-07-30 MED ORDER — PANTOPRAZOLE SODIUM 40 MG PO TBEC: 40.0000 mg | DELAYED_RELEASE_TABLET | Freq: Every day | ORAL | Status: DC

## 2011-07-30 MED ORDER — METOPROLOL SUCCINATE ER 100 MG PO TB24: ORAL_TABLET | ORAL | Status: DC

## 2011-07-31 ENCOUNTER — Other Ambulatory Visit: Payer: Self-pay | Admitting: *Deleted

## 2011-07-31 DIAGNOSIS — D473 Essential (hemorrhagic) thrombocythemia: Secondary | ICD-10-CM

## 2011-07-31 MED ORDER — ANAGRELIDE HCL 1 MG PO CAPS: 1.0000 mg | ORAL_CAPSULE | Freq: Two times a day (BID) | ORAL | Status: DC

## 2011-08-13 ENCOUNTER — Ambulatory Visit (HOSPITAL_BASED_OUTPATIENT_CLINIC_OR_DEPARTMENT_OTHER): Payer: Medicare Other

## 2011-08-13 ENCOUNTER — Other Ambulatory Visit (HOSPITAL_BASED_OUTPATIENT_CLINIC_OR_DEPARTMENT_OTHER): Payer: Medicare Other | Admitting: Lab

## 2011-08-13 ENCOUNTER — Other Ambulatory Visit: Payer: Self-pay | Admitting: Medical Oncology

## 2011-08-13 VITALS — BP 128/63 | HR 62 | Temp 98.2°F

## 2011-08-13 DIAGNOSIS — D473 Essential (hemorrhagic) thrombocythemia: Secondary | ICD-10-CM

## 2011-08-13 DIAGNOSIS — D631 Anemia in chronic kidney disease: Secondary | ICD-10-CM

## 2011-08-13 DIAGNOSIS — N189 Chronic kidney disease, unspecified: Secondary | ICD-10-CM

## 2011-08-13 DIAGNOSIS — N039 Chronic nephritic syndrome with unspecified morphologic changes: Secondary | ICD-10-CM

## 2011-08-13 LAB — CBC WITH DIFFERENTIAL/PLATELET
BASO%: 0.6 % (ref 0.0–2.0)
Eosinophils Absolute: 0.1 10*3/uL (ref 0.0–0.5)
HCT: 29.7 % — ABNORMAL LOW (ref 38.4–49.9)
HGB: 9.8 g/dL — ABNORMAL LOW (ref 13.0–17.1)
MCHC: 32.9 g/dL (ref 32.0–36.0)
MONO#: 0.4 10*3/uL (ref 0.1–0.9)
NEUT#: 3 10*3/uL (ref 1.5–6.5)
NEUT%: 67.9 % (ref 39.0–75.0)
WBC: 4.4 10*3/uL (ref 4.0–10.3)
lymph#: 0.9 10*3/uL (ref 0.9–3.3)

## 2011-08-13 MED ORDER — DARBEPOETIN ALFA-POLYSORBATE 300 MCG/0.6ML IJ SOLN
300.0000 ug | Freq: Once | INTRAMUSCULAR | Status: AC
  Administered 2011-08-13: 300 ug via SUBCUTANEOUS
  Filled 2011-08-13: qty 0.6

## 2011-09-03 ENCOUNTER — Other Ambulatory Visit (HOSPITAL_COMMUNITY): Payer: Medicare Other | Admitting: Radiology

## 2011-09-03 ENCOUNTER — Other Ambulatory Visit: Payer: Medicare Other | Admitting: *Deleted

## 2011-09-03 ENCOUNTER — Other Ambulatory Visit: Payer: Self-pay | Admitting: Oncology

## 2011-09-03 ENCOUNTER — Other Ambulatory Visit: Payer: Medicare Other | Admitting: Lab

## 2011-09-03 ENCOUNTER — Ambulatory Visit (HOSPITAL_BASED_OUTPATIENT_CLINIC_OR_DEPARTMENT_OTHER): Payer: Medicare Other

## 2011-09-03 VITALS — BP 123/57 | HR 73 | Temp 96.8°F

## 2011-09-03 DIAGNOSIS — N039 Chronic nephritic syndrome with unspecified morphologic changes: Secondary | ICD-10-CM

## 2011-09-03 DIAGNOSIS — D631 Anemia in chronic kidney disease: Secondary | ICD-10-CM

## 2011-09-03 DIAGNOSIS — N189 Chronic kidney disease, unspecified: Secondary | ICD-10-CM

## 2011-09-03 LAB — CBC WITH DIFFERENTIAL/PLATELET
Basophils Absolute: 0.1 10*3/uL (ref 0.0–0.1)
Eosinophils Absolute: 0.1 10*3/uL (ref 0.0–0.5)
HCT: 30 % — ABNORMAL LOW (ref 38.4–49.9)
HGB: 9.8 g/dL — ABNORMAL LOW (ref 13.0–17.1)
MCV: 95.8 fL (ref 79.3–98.0)
NEUT#: 2.5 10*3/uL (ref 1.5–6.5)
RDW: 16.3 % — ABNORMAL HIGH (ref 11.0–14.6)
lymph#: 0.7 10*3/uL — ABNORMAL LOW (ref 0.9–3.3)

## 2011-09-03 MED ORDER — DARBEPOETIN ALFA-POLYSORBATE 300 MCG/0.6ML IJ SOLN
300.0000 ug | Freq: Once | INTRAMUSCULAR | Status: AC
  Administered 2011-09-03: 300 ug via SUBCUTANEOUS
  Filled 2011-09-03: qty 0.6

## 2011-09-05 ENCOUNTER — Other Ambulatory Visit: Payer: Self-pay | Admitting: *Deleted

## 2011-09-06 ENCOUNTER — Other Ambulatory Visit: Payer: Self-pay | Admitting: Oncology

## 2011-09-10 ENCOUNTER — Other Ambulatory Visit (INDEPENDENT_AMBULATORY_CARE_PROVIDER_SITE_OTHER): Payer: Medicare Other

## 2011-09-10 DIAGNOSIS — I25810 Atherosclerosis of coronary artery bypass graft(s) without angina pectoris: Secondary | ICD-10-CM

## 2011-09-10 LAB — HEPATIC FUNCTION PANEL
Albumin: 3.7 g/dL (ref 3.5–5.2)
Total Protein: 6.5 g/dL (ref 6.0–8.3)

## 2011-09-10 LAB — LIPID PANEL
Cholesterol: 104 mg/dL (ref 0–200)
LDL Cholesterol: 50 mg/dL (ref 0–99)
Triglycerides: 108 mg/dL (ref 0.0–149.0)
VLDL: 21.6 mg/dL (ref 0.0–40.0)

## 2011-09-24 ENCOUNTER — Other Ambulatory Visit (HOSPITAL_BASED_OUTPATIENT_CLINIC_OR_DEPARTMENT_OTHER): Payer: Medicare Other | Admitting: Lab

## 2011-09-24 ENCOUNTER — Ambulatory Visit (HOSPITAL_BASED_OUTPATIENT_CLINIC_OR_DEPARTMENT_OTHER): Payer: Medicare Other

## 2011-09-24 VITALS — BP 164/70 | HR 58 | Temp 97.8°F

## 2011-09-24 DIAGNOSIS — D631 Anemia in chronic kidney disease: Secondary | ICD-10-CM

## 2011-09-24 DIAGNOSIS — N189 Chronic kidney disease, unspecified: Secondary | ICD-10-CM

## 2011-09-24 DIAGNOSIS — N039 Chronic nephritic syndrome with unspecified morphologic changes: Secondary | ICD-10-CM

## 2011-09-24 LAB — CBC WITH DIFFERENTIAL/PLATELET
Basophils Absolute: 0 10*3/uL (ref 0.0–0.1)
EOS%: 3.5 % (ref 0.0–7.0)
Eosinophils Absolute: 0.1 10*3/uL (ref 0.0–0.5)
HCT: 32 % — ABNORMAL LOW (ref 38.4–49.9)
HGB: 10.4 g/dL — ABNORMAL LOW (ref 13.0–17.1)
MCH: 31.5 pg (ref 27.2–33.4)
MCV: 97.2 fL (ref 79.3–98.0)
MONO%: 9.1 % (ref 0.0–14.0)
NEUT#: 2.7 10*3/uL (ref 1.5–6.5)
NEUT%: 70.1 % (ref 39.0–75.0)
Platelets: 92 10*3/uL — ABNORMAL LOW (ref 140–400)
RDW: 15.2 % — ABNORMAL HIGH (ref 11.0–14.6)

## 2011-09-24 MED ORDER — DARBEPOETIN ALFA-POLYSORBATE 300 MCG/0.6ML IJ SOLN
300.0000 ug | Freq: Once | INTRAMUSCULAR | Status: AC
  Administered 2011-09-24: 300 ug via SUBCUTANEOUS
  Filled 2011-09-24: qty 0.6

## 2011-09-25 ENCOUNTER — Telehealth: Payer: Self-pay | Admitting: Medical Oncology

## 2011-09-25 NOTE — Telephone Encounter (Signed)
I called pt per Dr. Arline Asp to let him know that his platelet count is drifting down. 09/24/11 it was 92. He is currently taking anagrelide 1 mg capsule twice a day. Per Dr. Arline Asp he would like for pt to take 1 capsule daily. Pt voiced understanding.

## 2011-10-15 ENCOUNTER — Encounter: Payer: Self-pay | Admitting: Oncology

## 2011-10-15 ENCOUNTER — Ambulatory Visit (HOSPITAL_BASED_OUTPATIENT_CLINIC_OR_DEPARTMENT_OTHER): Payer: Medicare Other | Admitting: Oncology

## 2011-10-15 ENCOUNTER — Telehealth: Payer: Self-pay | Admitting: Oncology

## 2011-10-15 ENCOUNTER — Other Ambulatory Visit (HOSPITAL_BASED_OUTPATIENT_CLINIC_OR_DEPARTMENT_OTHER): Payer: Medicare Other | Admitting: Lab

## 2011-10-15 VITALS — BP 104/54 | HR 59 | Temp 97.5°F | Ht 70.0 in | Wt 163.0 lb

## 2011-10-15 DIAGNOSIS — N189 Chronic kidney disease, unspecified: Secondary | ICD-10-CM

## 2011-10-15 DIAGNOSIS — D631 Anemia in chronic kidney disease: Secondary | ICD-10-CM

## 2011-10-15 DIAGNOSIS — D473 Essential (hemorrhagic) thrombocythemia: Secondary | ICD-10-CM

## 2011-10-15 DIAGNOSIS — N039 Chronic nephritic syndrome with unspecified morphologic changes: Secondary | ICD-10-CM

## 2011-10-15 LAB — COMPREHENSIVE METABOLIC PANEL
BUN: 89 mg/dL — ABNORMAL HIGH (ref 6–23)
CO2: 23 mEq/L (ref 19–32)
Calcium: 8.6 mg/dL (ref 8.4–10.5)
Chloride: 103 mEq/L (ref 96–112)
Creatinine, Ser: 2.54 mg/dL — ABNORMAL HIGH (ref 0.50–1.35)
Total Bilirubin: 0.3 mg/dL (ref 0.3–1.2)

## 2011-10-15 LAB — CBC WITH DIFFERENTIAL/PLATELET
BASO%: 1.3 % (ref 0.0–2.0)
Eosinophils Absolute: 0.2 10*3/uL (ref 0.0–0.5)
HCT: 30.6 % — ABNORMAL LOW (ref 38.4–49.9)
LYMPH%: 12.3 % — ABNORMAL LOW (ref 14.0–49.0)
MCHC: 32.9 g/dL (ref 32.0–36.0)
MCV: 96.9 fL (ref 79.3–98.0)
MONO#: 0.5 10*3/uL (ref 0.1–0.9)
MONO%: 11.1 % (ref 0.0–14.0)
NEUT%: 71.1 % (ref 39.0–75.0)
Platelets: 316 10*3/uL (ref 140–400)
WBC: 4.4 10*3/uL (ref 4.0–10.3)

## 2011-10-15 LAB — IRON AND TIBC
Iron: 48 ug/dL (ref 42–165)
UIBC: 220 ug/dL (ref 125–400)

## 2011-10-15 LAB — FERRITIN: Ferritin: 67 ng/mL (ref 22–322)

## 2011-10-15 LAB — LACTATE DEHYDROGENASE: LDH: 127 U/L (ref 94–250)

## 2011-10-15 MED ORDER — DARBEPOETIN ALFA-POLYSORBATE 300 MCG/0.6ML IJ SOLN
300.0000 ug | Freq: Once | INTRAMUSCULAR | Status: AC
  Administered 2011-10-15: 300 ug via SUBCUTANEOUS
  Filled 2011-10-15: qty 0.6

## 2011-10-15 NOTE — Telephone Encounter (Signed)
Gave pt appt for June , July , and August , lab, inj and MD

## 2011-10-15 NOTE — Progress Notes (Signed)
CC:   Marca Ancona, MD [name], M.D. [name], M.D.  PROBLEM LIST:  1. Essential thrombocythemia with diagnosis going back to April 1995.  JAK-2 mutation was absent on assay carried out on 03/23/2008. Mild  splenomegaly was present on nuclear scan in April 1995. The  patient has been treated with aspirin, hydroxyurea, anagrelide and  Aranesp.  2. History of retinal vein occlusion involving the left eye status  post left vitrectomy on 05/05/2003.  3. Coronary artery disease status post CABG x5 on 09/14/2007.  4. Renal insufficiency.  5. Hypertension.  6. Dyslipidemia.  7. History of esophageal stricture.  8. Anemia secondary to chronic renal disease and possibly  myeloproliferative disorder.  9. Systolic ejection murmur.  10.Hearing impairment.    MEDICATIONS:  1. Zovirax 200 mg daily.  2. Anagrelide dose is 1 mg daily as of 09/25/11 when the platelet count     dropped to 92,000.  3. Aspirin 81 mg daily.  4. Lipitor 20 mg daily.  5. Catapres 0.3 mg 3 times daily.  6. Lasix 60 mg daily.  7. Hydroxyurea currently 500 mg weekly will be discontinued today.  8. Niferex 150 mg twice a day.  9. Toprol-XL 100 mg twice a day.  10.Protonix 40 mg daily.  11.Metamucil 1 packet as needed.  12.Flomax 0.4 mg daily.  13.Ambien 5 mg as needed.  14.Kenalog 0.1% as directed. 15.Aranesp 300 mcg subcu every 3 weeks for hemoglobin less than or equal to 11.  HISTORY:  I saw [name] today for followup of his essential thrombocythemia and anemia associated with chronic renal disease.  Mr. Colgan was last seen by [place] on 07/24/2011.  He has come in every 3 weeks for CBC and Aranesp 300 mcg subcu whenever the hemoglobin is less than or equal to 11.  Hemoglobins have been running consistently below 11. Specifically on March 11th, hemoglobin was 9.8; on April 1st, hemoglobin 9.8; and on April 22nd, hemoglobin was 10.4.  Aranesp 300 mcg subcu was given on each of those dates.   Mr. Rafalski denies any problems; specifically, evidence of blood in his stools, chest pain, stroke, shortness of breath, bruising or bleeding problems.  We lowered his dose of Anagrelide from 1 mg twice a day to what 1 mg daily when his platelet count on 09/24/2011 fell to 92,000.  As stated, the patient is without complaints today.  He feels well.  He walks on a regular basis.  PHYSICAL EXAM:  General:  He looks well.  There are no obvious changes in his condition or periods.  He is 78 years old.  Vital Signs:  Weight is stable at 163 pounds, height 5 feet 10 inches, body surface area 1.91 sq m.  Blood pressure 104/54.  Other vital signs are normal.  HEENT: There is no scleral icterus.  Mouth and pharynx are benign.  There is no peripheral adenopathy palpable.  Lungs:  Clear to percussion and auscultation.  Cardiac:  Regular rhythm with systolic ejection murmur. Abdomen:  Benign with no organomegaly or masses palpable.  In the past, I have felt the liver 3-4 cm below the right costal margin. Extremities:  No peripheral edema or clubbing.  No petechiae or purpura. Neurologic:  Grossly normal.  LABORATORY DATA:  Today, white count 4.4, ANC 3.2, hemoglobin 10.1, hematocrit 30.6, platelets 316,000.  Chemistries from 07/24/2011 notable for BUN of 87, creatinine 2.30.  Ferritin was 132, iron saturation 30. Stools for occult blood on 08/13/2011 were negative x3.  IMAGING STUDIES:  1. Chest x-ray, 2 views, from 04/13/2008 showed no active disease.  The patient was status post median sternotomy and CABG.   IMPRESSION AND PLAN:  Mr. Mansfield seems to be doing well at the present time.  He will continue on Aranesp 1 mg daily.  We are going to discontinue the hydroxyurea.  We will continue to monitor CBCs every 3 weeks and give Aranesp 300 mcg subcu whenever the hemoglobin is less than or equal to 11.  We may need to add back some anagrelide if the platelet count seems to be rising, certainly  anything greater than 400,000.  The patient did receive Aranesp 300 mcg subcu today.  We will plan to see him again around August 26th at which time we will check CBC, chemistries, and iron studies.  As stated, CBCs will be checked every 3 weeks which should fall approximately on the following dates:  6/3, 6/24, 7/15, and 8/5.    ______________________________ Samul Dada, M.D. DSM/MEDQ  D:  10/15/2011  T:  10/15/2011  Job:  [id number]

## 2011-10-15 NOTE — Progress Notes (Signed)
This office note has been dictated.  #[id number]

## 2011-10-16 ENCOUNTER — Other Ambulatory Visit: Payer: Self-pay | Admitting: Oncology

## 2011-10-16 ENCOUNTER — Other Ambulatory Visit: Payer: Self-pay | Admitting: Medical Oncology

## 2011-10-16 ENCOUNTER — Encounter: Payer: Self-pay | Admitting: Oncology

## 2011-10-16 ENCOUNTER — Telehealth: Payer: Self-pay | Admitting: Medical Oncology

## 2011-10-16 DIAGNOSIS — D631 Anemia in chronic kidney disease: Secondary | ICD-10-CM

## 2011-10-16 DIAGNOSIS — N189 Chronic kidney disease, unspecified: Secondary | ICD-10-CM

## 2011-10-16 DIAGNOSIS — D473 Essential (hemorrhagic) thrombocythemia: Secondary | ICD-10-CM

## 2011-10-16 NOTE — Progress Notes (Signed)
We have suspected that this patient may have intermittent GI bleeding in the past. He did receive 1 dose of IV Feraheme 510 mg on 12/12/2009. Following that treatment the patient developed a fine red rash on his chest that lasted a couple of days. I don't believe he has had any additional doses of IV iron and instead has been maintained on oral iron.  On 01/15/2011,  ferritin was 26 down from 114 on 10/23/2010 and 154 on 08/01/2010. Stools for occult blood have been negative x3 on 05/04/2008 and 08/13/2011.  According to my office note from 10/15/2011, the patient is taking Niferex 150 mg twice a day. We will need to verify that he is being compliant and we may want to increase the dose to 3 times a day.

## 2011-10-16 NOTE — Telephone Encounter (Signed)
I called pt and spoke with wife regarding his CMET. I notified him that his potassium is 5.5 and kidney function elevated. I faxed these labs to Dr. Arrie Aran.

## 2011-10-17 ENCOUNTER — Telehealth: Payer: Self-pay | Admitting: Medical Oncology

## 2011-10-17 NOTE — Telephone Encounter (Signed)
I called pt and left a message per Dr.Murinson to let him know that his ferritin has dropped from 132 to 67. Dr. Arline Asp would like to verify if he has been taking his Niferex 150mg   BID. If he has Dr. Arline Asp would like for him to increase to  Niferex 150mg  TID. I asked him to call me back to discuss. I also informed him that Dr. Lolita Patella office called and requested [place] to check his BMET with his next labs here.

## 2011-10-19 ENCOUNTER — Telehealth: Payer: Self-pay

## 2011-10-19 NOTE — Telephone Encounter (Signed)
Verified w/pt that he did get Robin's call and did increase his niferex from BID to TID. Pt expressed he did increase iron.

## 2011-10-19 NOTE — Telephone Encounter (Signed)
error 

## 2011-10-22 ENCOUNTER — Other Ambulatory Visit: Payer: Medicare Other | Admitting: Lab

## 2011-10-22 ENCOUNTER — Ambulatory Visit: Payer: Medicare Other | Admitting: Oncology

## 2011-11-05 ENCOUNTER — Ambulatory Visit (HOSPITAL_BASED_OUTPATIENT_CLINIC_OR_DEPARTMENT_OTHER): Payer: Medicare Other

## 2011-11-05 ENCOUNTER — Other Ambulatory Visit (HOSPITAL_BASED_OUTPATIENT_CLINIC_OR_DEPARTMENT_OTHER): Payer: Medicare Other | Admitting: Lab

## 2011-11-05 VITALS — BP 124/66 | HR 64 | Temp 97.9°F

## 2011-11-05 DIAGNOSIS — D473 Essential (hemorrhagic) thrombocythemia: Secondary | ICD-10-CM

## 2011-11-05 DIAGNOSIS — N039 Chronic nephritic syndrome with unspecified morphologic changes: Secondary | ICD-10-CM

## 2011-11-05 DIAGNOSIS — D631 Anemia in chronic kidney disease: Secondary | ICD-10-CM

## 2011-11-05 DIAGNOSIS — N189 Chronic kidney disease, unspecified: Secondary | ICD-10-CM

## 2011-11-05 LAB — BASIC METABOLIC PANEL
BUN: 99 mg/dL — ABNORMAL HIGH (ref 6–23)
Chloride: 103 mEq/L (ref 96–112)
Glucose, Bld: 116 mg/dL — ABNORMAL HIGH (ref 70–99)
Potassium: 5.4 mEq/L — ABNORMAL HIGH (ref 3.5–5.3)

## 2011-11-05 LAB — CBC WITH DIFFERENTIAL/PLATELET
BASO%: 1.2 % (ref 0.0–2.0)
EOS%: 4.3 % (ref 0.0–7.0)
HCT: 29.4 % — ABNORMAL LOW (ref 38.4–49.9)
LYMPH%: 13.6 % — ABNORMAL LOW (ref 14.0–49.0)
MCH: 31.4 pg (ref 27.2–33.4)
MCHC: 32.8 g/dL (ref 32.0–36.0)
NEUT%: 69.9 % (ref 39.0–75.0)
Platelets: 346 10*3/uL (ref 140–400)
RBC: 3.08 10*6/uL — ABNORMAL LOW (ref 4.20–5.82)
WBC: 4.3 10*3/uL (ref 4.0–10.3)
lymph#: 0.6 10*3/uL — ABNORMAL LOW (ref 0.9–3.3)
nRBC: 0 % (ref 0–0)

## 2011-11-05 MED ORDER — DARBEPOETIN ALFA-POLYSORBATE 300 MCG/0.6ML IJ SOLN
300.0000 ug | Freq: Once | INTRAMUSCULAR | Status: AC
  Administered 2011-11-05: 300 ug via SUBCUTANEOUS
  Filled 2011-11-05: qty 0.6

## 2011-11-06 ENCOUNTER — Encounter: Payer: Self-pay | Admitting: Medical Oncology

## 2011-11-06 NOTE — Progress Notes (Signed)
Labs from 11/05/11  faxed to Dr. Arrie Aran

## 2011-11-26 ENCOUNTER — Other Ambulatory Visit (HOSPITAL_BASED_OUTPATIENT_CLINIC_OR_DEPARTMENT_OTHER): Payer: Medicare Other | Admitting: Lab

## 2011-11-26 ENCOUNTER — Encounter: Payer: Self-pay | Admitting: Cardiology

## 2011-11-26 ENCOUNTER — Ambulatory Visit (HOSPITAL_BASED_OUTPATIENT_CLINIC_OR_DEPARTMENT_OTHER): Payer: Medicare Other

## 2011-11-26 VITALS — BP 130/59 | HR 66 | Temp 97.0°F

## 2011-11-26 DIAGNOSIS — N189 Chronic kidney disease, unspecified: Secondary | ICD-10-CM

## 2011-11-26 DIAGNOSIS — D631 Anemia in chronic kidney disease: Secondary | ICD-10-CM

## 2011-11-26 LAB — CBC WITH DIFFERENTIAL/PLATELET
BASO%: 1.1 % (ref 0.0–2.0)
EOS%: 3.8 % (ref 0.0–7.0)
LYMPH%: 13.9 % — ABNORMAL LOW (ref 14.0–49.0)
MCHC: 33 g/dL (ref 32.0–36.0)
MCV: 95.8 fL (ref 79.3–98.0)
MONO%: 10.1 % (ref 0.0–14.0)
Platelets: 324 10*3/uL (ref 140–400)
RBC: 2.88 10*6/uL — ABNORMAL LOW (ref 4.20–5.82)

## 2011-11-26 MED ORDER — DARBEPOETIN ALFA-POLYSORBATE 300 MCG/0.6ML IJ SOLN
300.0000 ug | Freq: Once | INTRAMUSCULAR | Status: AC
  Administered 2011-11-26: 300 ug via SUBCUTANEOUS
  Filled 2011-11-26: qty 0.6

## 2011-11-29 ENCOUNTER — Telehealth: Payer: Self-pay

## 2011-11-29 NOTE — Telephone Encounter (Signed)
Labs drawn 6/24 at Dr Hadley Pen received and forwarded to DSM

## 2011-12-14 ENCOUNTER — Other Ambulatory Visit: Payer: Self-pay | Admitting: Nurse Practitioner

## 2011-12-14 DIAGNOSIS — D473 Essential (hemorrhagic) thrombocythemia: Secondary | ICD-10-CM

## 2011-12-17 ENCOUNTER — Other Ambulatory Visit (HOSPITAL_BASED_OUTPATIENT_CLINIC_OR_DEPARTMENT_OTHER): Payer: Medicare Other | Admitting: Lab

## 2011-12-17 ENCOUNTER — Ambulatory Visit (HOSPITAL_BASED_OUTPATIENT_CLINIC_OR_DEPARTMENT_OTHER): Payer: Medicare Other

## 2011-12-17 VITALS — BP 133/69 | HR 66 | Temp 97.6°F

## 2011-12-17 DIAGNOSIS — N039 Chronic nephritic syndrome with unspecified morphologic changes: Secondary | ICD-10-CM

## 2011-12-17 DIAGNOSIS — N189 Chronic kidney disease, unspecified: Secondary | ICD-10-CM

## 2011-12-17 DIAGNOSIS — D473 Essential (hemorrhagic) thrombocythemia: Secondary | ICD-10-CM

## 2011-12-17 DIAGNOSIS — D631 Anemia in chronic kidney disease: Secondary | ICD-10-CM

## 2011-12-17 LAB — CBC & DIFF AND RETIC
Basophils Absolute: 0.1 10*3/uL (ref 0.0–0.1)
Eosinophils Absolute: 0.2 10*3/uL (ref 0.0–0.5)
HGB: 9.6 g/dL — ABNORMAL LOW (ref 13.0–17.1)
Immature Retic Fract: 3.5 % (ref 3.00–10.60)
MCV: 93.8 fL (ref 79.3–98.0)
MONO#: 0.4 10*3/uL (ref 0.1–0.9)
MONO%: 9.1 % (ref 0.0–14.0)
NEUT#: 3.5 10*3/uL (ref 1.5–6.5)
Platelets: 298 10*3/uL (ref 140–400)
RBC: 3.2 10*6/uL — ABNORMAL LOW (ref 4.20–5.82)
RDW: 14.4 % (ref 11.0–14.6)
Retic %: 1.95 % — ABNORMAL HIGH (ref 0.80–1.80)
WBC: 4.8 10*3/uL (ref 4.0–10.3)
nRBC: 0 % (ref 0–0)

## 2011-12-17 MED ORDER — DARBEPOETIN ALFA-POLYSORBATE 300 MCG/0.6ML IJ SOLN
300.0000 ug | Freq: Once | INTRAMUSCULAR | Status: AC
  Administered 2011-12-17: 300 ug via SUBCUTANEOUS
  Filled 2011-12-17: qty 0.6

## 2012-01-02 ENCOUNTER — Telehealth: Payer: Self-pay | Admitting: Oncology

## 2012-01-02 NOTE — Telephone Encounter (Signed)
l/m with new appt info for 9/24   aom

## 2012-01-07 ENCOUNTER — Ambulatory Visit (HOSPITAL_BASED_OUTPATIENT_CLINIC_OR_DEPARTMENT_OTHER): Payer: Medicare Other

## 2012-01-07 ENCOUNTER — Other Ambulatory Visit (HOSPITAL_BASED_OUTPATIENT_CLINIC_OR_DEPARTMENT_OTHER): Payer: Medicare Other | Admitting: Lab

## 2012-01-07 VITALS — BP 157/59 | HR 69 | Temp 97.7°F | Resp 18

## 2012-01-07 DIAGNOSIS — D631 Anemia in chronic kidney disease: Secondary | ICD-10-CM

## 2012-01-07 DIAGNOSIS — D473 Essential (hemorrhagic) thrombocythemia: Secondary | ICD-10-CM

## 2012-01-07 LAB — CBC & DIFF AND RETIC
BASO%: 0.8 % (ref 0.0–2.0)
Eosinophils Absolute: 0.2 10*3/uL (ref 0.0–0.5)
Immature Retic Fract: 1.8 % — ABNORMAL LOW (ref 3.00–10.60)
LYMPH%: 15.1 % (ref 14.0–49.0)
MCHC: 32.2 g/dL (ref 32.0–36.0)
MONO#: 0.3 10*3/uL (ref 0.1–0.9)
NEUT#: 3.5 10*3/uL (ref 1.5–6.5)
Platelets: 329 10*3/uL (ref 140–400)
RBC: 3.22 10*6/uL — ABNORMAL LOW (ref 4.20–5.82)
RDW: 14.3 % (ref 11.0–14.6)
Retic %: 1.7 % (ref 0.80–1.80)
Retic Ct Abs: 54.74 10*3/uL (ref 34.80–93.90)
WBC: 4.8 10*3/uL (ref 4.0–10.3)
lymph#: 0.7 10*3/uL — ABNORMAL LOW (ref 0.9–3.3)

## 2012-01-07 MED ORDER — DARBEPOETIN ALFA-POLYSORBATE 300 MCG/0.6ML IJ SOLN
300.0000 ug | Freq: Once | INTRAMUSCULAR | Status: AC
  Administered 2012-01-07: 300 ug via SUBCUTANEOUS
  Filled 2012-01-07: qty 0.6

## 2012-01-22 ENCOUNTER — Other Ambulatory Visit: Payer: Self-pay | Admitting: Cardiology

## 2012-01-28 ENCOUNTER — Other Ambulatory Visit: Payer: Medicare Other | Admitting: Lab

## 2012-01-28 ENCOUNTER — Ambulatory Visit: Payer: Medicare Other

## 2012-01-29 ENCOUNTER — Ambulatory Visit: Payer: Medicare Other

## 2012-01-29 ENCOUNTER — Other Ambulatory Visit: Payer: Self-pay | Admitting: Oncology

## 2012-01-29 ENCOUNTER — Ambulatory Visit: Payer: Medicare Other | Admitting: Oncology

## 2012-01-29 ENCOUNTER — Ambulatory Visit (HOSPITAL_BASED_OUTPATIENT_CLINIC_OR_DEPARTMENT_OTHER): Payer: Medicare Other

## 2012-01-29 ENCOUNTER — Other Ambulatory Visit (HOSPITAL_BASED_OUTPATIENT_CLINIC_OR_DEPARTMENT_OTHER): Payer: Medicare Other | Admitting: Lab

## 2012-01-29 ENCOUNTER — Telehealth: Payer: Self-pay | Admitting: Medical Oncology

## 2012-01-29 VITALS — BP 134/59 | HR 59 | Temp 97.1°F

## 2012-01-29 DIAGNOSIS — D631 Anemia in chronic kidney disease: Secondary | ICD-10-CM

## 2012-01-29 DIAGNOSIS — N189 Chronic kidney disease, unspecified: Secondary | ICD-10-CM

## 2012-01-29 DIAGNOSIS — N039 Chronic nephritic syndrome with unspecified morphologic changes: Secondary | ICD-10-CM

## 2012-01-29 DIAGNOSIS — D473 Essential (hemorrhagic) thrombocythemia: Secondary | ICD-10-CM

## 2012-01-29 LAB — CBC WITH DIFFERENTIAL/PLATELET
BASO%: 1.3 % (ref 0.0–2.0)
Basophils Absolute: 0.1 10*3/uL (ref 0.0–0.1)
EOS%: 3.7 % (ref 0.0–7.0)
HGB: 9.7 g/dL — ABNORMAL LOW (ref 13.0–17.1)
MCH: 31.7 pg (ref 27.2–33.4)
MCHC: 33.8 g/dL (ref 32.0–36.0)
MONO#: 0.4 10*3/uL (ref 0.1–0.9)
RDW: 14 % (ref 11.0–14.6)
WBC: 4.8 10*3/uL (ref 4.0–10.3)
lymph#: 0.8 10*3/uL — ABNORMAL LOW (ref 0.9–3.3)

## 2012-01-29 LAB — IRON AND TIBC
%SAT: 30 % (ref 20–55)
TIBC: 263 ug/dL (ref 215–435)
UIBC: 184 ug/dL (ref 125–400)

## 2012-01-29 MED ORDER — DARBEPOETIN ALFA-POLYSORBATE 300 MCG/0.6ML IJ SOLN
300.0000 ug | Freq: Once | INTRAMUSCULAR | Status: AC
  Administered 2012-01-29: 300 ug via SUBCUTANEOUS
  Filled 2012-01-29: qty 0.6

## 2012-01-30 ENCOUNTER — Other Ambulatory Visit: Payer: Self-pay

## 2012-01-30 ENCOUNTER — Other Ambulatory Visit: Payer: Self-pay | Admitting: Cardiology

## 2012-02-01 ENCOUNTER — Telehealth: Payer: Self-pay | Admitting: Oncology

## 2012-02-01 NOTE — Telephone Encounter (Signed)
lmonvm re new appt for 9/16. Also confirmed that 9/24 appt remains the same. appt schedule for September/October mailed today.

## 2012-02-11 ENCOUNTER — Telehealth: Payer: Self-pay | Admitting: Oncology

## 2012-02-11 NOTE — Telephone Encounter (Signed)
pt had called and l/m to chang e appts,l/m for pt to ret my call  aom

## 2012-02-13 ENCOUNTER — Other Ambulatory Visit: Payer: Self-pay | Admitting: Oncology

## 2012-02-13 ENCOUNTER — Encounter: Payer: Self-pay | Admitting: Oncology

## 2012-02-13 DIAGNOSIS — D631 Anemia in chronic kidney disease: Secondary | ICD-10-CM

## 2012-02-13 DIAGNOSIS — D473 Essential (hemorrhagic) thrombocythemia: Secondary | ICD-10-CM

## 2012-02-13 NOTE — Progress Notes (Signed)
We received labs from Dr. Arrie Aran, drawn 02/05/2012. Ferritin was 40 and iron saturation 16. Serum iron was 44 and TIBC 271.  The patient will need to receive IV iron. He has an appointment to see me on 02/26/2012. I believe his original appointment had been a month earlier.

## 2012-02-18 ENCOUNTER — Other Ambulatory Visit: Payer: Medicare Other | Admitting: Lab

## 2012-02-18 ENCOUNTER — Ambulatory Visit: Payer: Medicare Other

## 2012-02-18 ENCOUNTER — Telehealth: Payer: Self-pay | Admitting: Medical Oncology

## 2012-02-18 NOTE — Telephone Encounter (Signed)
Pt called and states he has an appointment with Dr. Arline Asp next week and would like to know if he can just wait and do his lab and aranesp next week. Per Dr. Arline Asp this is fine. Pt notified.

## 2012-02-26 ENCOUNTER — Ambulatory Visit (HOSPITAL_BASED_OUTPATIENT_CLINIC_OR_DEPARTMENT_OTHER): Payer: Medicare Other | Admitting: Oncology

## 2012-02-26 ENCOUNTER — Telehealth: Payer: Self-pay

## 2012-02-26 ENCOUNTER — Encounter: Payer: Self-pay | Admitting: Oncology

## 2012-02-26 ENCOUNTER — Other Ambulatory Visit (HOSPITAL_BASED_OUTPATIENT_CLINIC_OR_DEPARTMENT_OTHER): Payer: Medicare Other | Admitting: Lab

## 2012-02-26 VITALS — BP 113/54 | HR 98 | Temp 98.0°F | Resp 20 | Ht 70.0 in | Wt 160.0 lb

## 2012-02-26 DIAGNOSIS — D631 Anemia in chronic kidney disease: Secondary | ICD-10-CM

## 2012-02-26 DIAGNOSIS — D473 Essential (hemorrhagic) thrombocythemia: Secondary | ICD-10-CM

## 2012-02-26 DIAGNOSIS — D649 Anemia, unspecified: Secondary | ICD-10-CM

## 2012-02-26 DIAGNOSIS — N039 Chronic nephritic syndrome with unspecified morphologic changes: Secondary | ICD-10-CM

## 2012-02-26 DIAGNOSIS — N289 Disorder of kidney and ureter, unspecified: Secondary | ICD-10-CM

## 2012-02-26 DIAGNOSIS — N189 Chronic kidney disease, unspecified: Secondary | ICD-10-CM

## 2012-02-26 LAB — CBC WITH DIFFERENTIAL/PLATELET
EOS%: 2.6 % (ref 0.0–7.0)
MCH: 30.9 pg (ref 27.2–33.4)
MCV: 94.1 fL (ref 79.3–98.0)
MONO%: 10 % (ref 0.0–14.0)
NEUT#: 5.1 10*3/uL (ref 1.5–6.5)
RBC: 3.03 10*6/uL — ABNORMAL LOW (ref 4.20–5.82)
RDW: 14 % (ref 11.0–14.6)

## 2012-02-26 LAB — IRON AND TIBC
%SAT: 9 % — ABNORMAL LOW (ref 20–55)
Iron: 18 ug/dL — ABNORMAL LOW (ref 42–165)
UIBC: 191 ug/dL (ref 125–400)

## 2012-02-26 LAB — COMPREHENSIVE METABOLIC PANEL (CC13)
AST: 9 U/L (ref 5–34)
Albumin: 3.1 g/dL — ABNORMAL LOW (ref 3.5–5.0)
Alkaline Phosphatase: 74 U/L (ref 40–150)
Potassium: 5.1 mEq/L (ref 3.5–5.1)
Sodium: 135 mEq/L — ABNORMAL LOW (ref 136–145)
Total Protein: 5.9 g/dL — ABNORMAL LOW (ref 6.4–8.3)

## 2012-02-26 MED ORDER — DARBEPOETIN ALFA-POLYSORBATE 300 MCG/0.6ML IJ SOLN
300.0000 ug | Freq: Once | INTRAMUSCULAR | Status: AC
  Administered 2012-02-26: 300 ug via SUBCUTANEOUS
  Filled 2012-02-26: qty 0.6

## 2012-02-26 NOTE — Progress Notes (Signed)
CC:   Marca Ancona, MD [name], M.D. [name], M.D.   PROBLEM LIST:  1. Essential thrombocythemia with diagnosis going back to April 1995.  JAK-2 mutation was absent on assay carried out on 03/23/2008. Mild  splenomegaly was present on nuclear scan in April 1995. The  patient has been treated with aspirin, hydroxyurea, anagrelide and  Aranesp.  2. History of retinal vein occlusion involving the left eye status  post left vitrectomy on 05/05/2003.  3. Coronary artery disease status post CABG x5 on 09/14/2007.  4. Renal insufficiency.  5. Hypertension.  6. Dyslipidemia.  7. History of esophageal stricture.  8. Anemia secondary to chronic renal disease with possible contributions from the patient's underlying myeloproliferative disorder and iron deficiency.  9. Systolic ejection murmur.  10.Hearing impairment.    MEDICATIONS:  1. Zovirax 200 mg daily.  2. Anagrelide dose is 1 mg daily as of 09/25/11 when the platelet count  dropped to 92,000.  3. Aspirin 81 mg daily.  4. Lipitor 20 mg daily.  5. Catapres 0.3 mg 3 times daily.  6. Lasix 60 mg daily.  7. Hydroxyurea was discontinued on 10/15/2011. 8. Niferex 150 mg twice a day.  9. Toprol-XL 100 mg twice a day.  10.Protonix 40 mg daily.  11.Metamucil 1 packet as needed.  12.Flomax 0.4 mg daily.  13.Ambien 5 mg as needed.  14.Kenalog 0.1% as directed.  15.Cozaar 25 mg daily. 16.Aranesp 300 mcg subcu every 3 weeks for hemoglobin less than or equal to 11.   SMOKING HISTORY:  The patient smoked 2 packs cigarettes a day for about 30 years but stopped smoking 30 years ago.    HISTORY:  I saw [name] today for followup of his essential thrombocythemia and anemia associated with chronic renal disease.  [name] was last seen by [place] on 10/15/2011.  He continues to come in every 3 weeks for CBC and Aranesp 300 mcg subcu whenever the hemoglobin is less than 11.  The patient's hemoglobin has, for the most  part, been in the 9-10 range with occasionally levels in the 10-11 range, but I do not see any values that have exceeded 11.  The patient has gotten along fairly well.  He walks about a mile 3 times a week.  The patient tells me that a week or 2 ago he had an episode of constipation.  He had not moved his bowels for about 3 days  He took a suppository with some relief.  Since then, he has been having frequent fairly small but liquid stools up to 6 a day, occasionally at night.  He is not having any cramping.  He has noticed no blood.  I suggested to [name] that he could have fecal impaction with some seepage around the impaction.  He has never had anything like this before.  I suggested that he may want to try to get himself cleaned out with laxatives or perhaps an enema. If his problem does not resolve, then he needs to see a GI specialist.  The patient also has had a drop in his ferritin.  It will be recalled that he had received IV Feraheme back in July 2011 and apparently developed a mild rash from that.  We will go ahead and set him up with IV Feraheme sometime in the next week or so.  We will make plan to give him Benadryl 25 mg IV and Solu-Medrol 20 mg IV as premeds.  Aside from these issues, the patient  seems to be getting along fairly well and is without any complaints.  He denies any chest pain, symptoms of stroke, shortness of breath or any other major changes in his clinical status.  PHYSICAL EXAM:  [name] is now 78 years old.  He does look a little bit frail, a little bit unsteady on his feet, but he denies any history of falling.  Weight is 160 pounds which is pretty much his baseline. Height 5 feet 10 inches, body surface area 1.89 sq m, blood pressure 113/54.  Other vital signs are normal.  Pulse is regular.  There is no scleral icterus.  Mouth and pharynx are benign.  No peripheral adenopathy palpable.  Lungs:  Clear to percussion and auscultation. Cardiac:   Regular rhythm with a soft systolic ejection murmur.  Abdomen: Benign with no organomegaly or masses palpable.  The patient is a little uncomfortable on palpation of his lower abdomen.  I did not appreciate any hepatosplenomegaly today.  Extremities:  No peripheral edema or clubbing.  Neurologic:  Exam is grossly normal.  LABORATORY DATA:  Today, white count 6.7, ANC 5.1, hemoglobin 9.4, hematocrit 28.5, platelets 286,000.  Chemistries today notable for a BUN of 106.0, creatinine 3.1, albumin 3.1, calcium 8.4.  Liver function tests were normal with an LDH of 153.  Sodium 135, potassium 5.1.  Iron studies today are pending.  Ferritin on 08/27 and also on 10/15/2011 was 67, down from 132 on 07/24/2011. Stools for occult blood on 08/13/2011 were negative x3.   IMAGING STUDIES:  1. Chest x-ray, 2 views, from 04/13/2008 showed no active disease.  The patient was status post median sternotomy and CABG.   IMPRESSION AND PLAN:  [name] will receive Aranesp 300 mcg today.  We will check CBC every 3 weeks and plan to give him Aranesp 300 mcg subcu whenever his hemoglobin is less than or equal to 11.  [name] will continue on anagrelide 1 mg daily.  We have discontinued hydroxyurea as of his last appointment on 10/15/2011.  Prior to that, he was taking only 500 mg weekly.  We will go ahead and plan to give [name] an infusion of IV Feraheme 1020 mg IV.  He will receive premeds of Benadryl and 25 mg and Solu- Medrol 20 mg IV before his IV iron infusion.  We will plan to do that early next week somewhere around September 30th.  We talked today about the bowel problem [name] was having.  It sounds as though he may have a fecal impaction.  I have recommended that he try to get himself cleaned out with either laxatives or an enema.  If his symptoms persist or if he continues to have other bowel problems, then he is going to need to see a GI specialist.  We will plan to see [name] again in approximately 4 months which will be around January 27th at which time we will check CBC, chemistries, and iron studies.    ______________________________ Samul Dada, M.D. DSM/MEDQ  D:  02/26/2012  T:  02/26/2012  Job:  [id number]

## 2012-02-26 NOTE — Telephone Encounter (Signed)
Gave pt apt for Inj and lab every 3 weeks and and MD visit with injection in January 2014 with labs & injection. Called Olegario Messier RN, regarding Iron, pt informed me that he is supposed to have iron on Monday 03/03/12

## 2012-02-26 NOTE — Patient Instructions (Signed)
If your bowel problems do not improve after taking laxatives or an enema, you need to make an appointment with a GI specialist.  Call us if you develop a rash or other problems after your iron infusion.

## 2012-02-26 NOTE — Telephone Encounter (Signed)
Faxed cbc, cmet to Dr Arrie Aran

## 2012-02-26 NOTE — Progress Notes (Signed)
This office note has been dictated.  #[id number]

## 2012-02-27 ENCOUNTER — Other Ambulatory Visit: Payer: Self-pay | Admitting: Medical Oncology

## 2012-02-27 ENCOUNTER — Other Ambulatory Visit: Payer: Self-pay | Admitting: Oncology

## 2012-02-27 ENCOUNTER — Telehealth: Payer: Self-pay | Admitting: Oncology

## 2012-02-27 NOTE — Telephone Encounter (Signed)
S/w pt re appt for 10/1. appt moved from 9/30 to 10/1 per pt. appt scheduled per 9/25 pof.

## 2012-02-29 ENCOUNTER — Telehealth: Payer: Self-pay | Admitting: Cardiology

## 2012-02-29 NOTE — Telephone Encounter (Signed)
Pt request return call from Dawayne Patricia. [phone] to discuss medical care

## 2012-03-03 ENCOUNTER — Ambulatory Visit: Payer: Medicare Other

## 2012-03-04 ENCOUNTER — Ambulatory Visit (HOSPITAL_BASED_OUTPATIENT_CLINIC_OR_DEPARTMENT_OTHER): Payer: Medicare Other

## 2012-03-04 VITALS — BP 125/63 | HR 66 | Temp 98.2°F | Resp 20

## 2012-03-04 DIAGNOSIS — N189 Chronic kidney disease, unspecified: Secondary | ICD-10-CM

## 2012-03-04 DIAGNOSIS — D631 Anemia in chronic kidney disease: Secondary | ICD-10-CM

## 2012-03-04 DIAGNOSIS — N039 Chronic nephritic syndrome with unspecified morphologic changes: Secondary | ICD-10-CM

## 2012-03-04 MED ORDER — METHYLPREDNISOLONE SODIUM SUCC 40 MG IJ SOLR
20.0000 mg | Freq: Once | INTRAMUSCULAR | Status: AC
  Administered 2012-03-04: 20 mg via INTRAVENOUS

## 2012-03-04 MED ORDER — FERUMOXYTOL INJECTION 510 MG/17 ML
510.0000 mg | Freq: Once | INTRAVENOUS | Status: AC
  Administered 2012-03-04: 510 mg via INTRAVENOUS
  Filled 2012-03-04: qty 17

## 2012-03-04 MED ORDER — DIPHENHYDRAMINE HCL 50 MG/ML IJ SOLN
25.0000 mg | Freq: Once | INTRAMUSCULAR | Status: AC
  Administered 2012-03-04: 25 mg via INTRAVENOUS

## 2012-03-04 NOTE — Patient Instructions (Addendum)
Ferumoxytol injection What is this medicine? FERUMOXYTOL is an iron complex. Iron is used to make healthy red blood cells, which carry oxygen and nutrients throughout the body. This medicine is used to treat iron deficiency anemia in people with chronic kidney disease. This medicine may be used for other purposes; ask your health care provider or pharmacist if you have questions. What should I tell my health care provider before I take this medicine? They need to know if you have any of these conditions: -anemia not caused by low iron levels -high levels of iron in the blood -magnetic resonance imaging (MRI) test scheduled -an unusual or allergic reaction to iron, other medicines, foods, dyes, or preservatives -pregnant or trying to get pregnant -breast-feeding How should I use this medicine? This medicine is for infusion into a vein. It is given by a health care professional in a hospital or clinic setting. Talk to your pediatrician regarding the use of this medicine in children. Special care may be needed. Overdosage: If you think you've taken too much of this medicine contact a poison control center or emergency room at once. Overdosage: If you think you have taken too much of this medicine contact a poison control center or emergency room at once. NOTE: This medicine is only for you. Do not share this medicine with others. What if I miss a dose? It is important not to miss your dose. Call your doctor or health care professional if you are unable to keep an appointment. What may interact with this medicine? This medicine may interact with the following medications: -other iron products This list may not describe all possible interactions. Give your health care provider a list of all the medicines, herbs, non-prescription drugs, or dietary supplements you use. Also tell them if you smoke, drink alcohol, or use illegal drugs. Some items may interact with your medicine. What should I watch  for while using this medicine? Visit your doctor or healthcare professional regularly. Tell your doctor or healthcare professional if your symptoms do not start to get better or if they get worse. You may need blood work done while you are taking this medicine. You may need to follow a special diet. Talk to your doctor. Foods that contain iron include: whole grains/cereals, dried fruits, beans, or peas, leafy green vegetables, and organ meats (liver, kidney). What side effects may I notice from receiving this medicine? Side effects that you should report to your doctor or health care professional as soon as possible: -allergic reactions like skin rash, itching or hives, swelling of the face, lips, or tongue -breathing problems -changes in blood pressure -feeling faint or lightheaded, falls -fever or chills -flushing, sweating, or hot feelings -swelling of the ankles or feet Side effects that usually do not require medical attention (Report these to your doctor or health care professional if they continue or are bothersome.): -diarrhea -headache -nausea, vomiting -stomach pain This list may not describe all possible side effects. Call your doctor for medical advice about side effects. You may report side effects to FDA at 1-800-FDA-1088. Where should I keep my medicine? This drug is given in a hospital or clinic and will not be stored at home. NOTE: This sheet is a summary. It may not cover all possible information. If you have questions about this medicine, talk to your doctor, pharmacist, or health care provider.  2012, [employer]. (02/11/2008 9:48:25 PM) 

## 2012-03-04 NOTE — Telephone Encounter (Signed)
Spoke to wife on October 1st. She would not elaborate as to what Jaysten wanted to talk about. Says he is feeling better. I offered to see him at his convenience.

## 2012-03-06 ENCOUNTER — Encounter: Payer: Self-pay | Admitting: Dietician

## 2012-03-06 NOTE — Progress Notes (Signed)
Brief Out-patient Oncology Nutrition Note  Reason: Patient Screened Positive For Nutrition Risk For Unintentional Weight Loss and Decreased Appetite.   [name] is a 78 year patient of Dr. Arline Asp, diagnosed anemia due to renal failure. Contacted patient via telephone for positive nutrition risk. Spoke with patient's family member. She reported patient is doing well, but was very constipated after an IV of Iron. She asked good questions and was without any further nutrition related questions. I have encouraged her to provide patient with iron rich foods.   Wt Readings from Last 10 Encounters:  02/26/12 160 lb (72.576 kg)  10/15/11 163 lb (73.936 kg)  07/24/11 163 lb (73.936 kg)  06/18/11 165 lb (74.844 kg)  04/10/11 156 lb 12.8 oz (71.124 kg)  01/15/11 160 lb 14.4 oz (72.984 kg)  07/24/10 165 lb (74.844 kg)    Provided RD contact information and instructed patient to contact RD for future questions.   RD available for nutrition needs.   Bryan Finn, MS, RD, LDN [phone]

## 2012-03-07 ENCOUNTER — Encounter (INDEPENDENT_AMBULATORY_CARE_PROVIDER_SITE_OTHER): Payer: Self-pay | Admitting: General Surgery

## 2012-03-10 ENCOUNTER — Other Ambulatory Visit: Payer: Medicare Other | Admitting: Lab

## 2012-03-10 ENCOUNTER — Ambulatory Visit: Payer: Medicare Other

## 2012-03-14 ENCOUNTER — Encounter (INDEPENDENT_AMBULATORY_CARE_PROVIDER_SITE_OTHER): Payer: Self-pay | Admitting: General Surgery

## 2012-03-17 ENCOUNTER — Ambulatory Visit: Payer: Medicare Other

## 2012-03-24 ENCOUNTER — Ambulatory Visit (HOSPITAL_BASED_OUTPATIENT_CLINIC_OR_DEPARTMENT_OTHER): Payer: Medicare Other

## 2012-03-24 ENCOUNTER — Other Ambulatory Visit (HOSPITAL_BASED_OUTPATIENT_CLINIC_OR_DEPARTMENT_OTHER): Payer: Medicare Other

## 2012-03-24 VITALS — BP 105/57 | HR 63 | Temp 97.0°F

## 2012-03-24 DIAGNOSIS — D631 Anemia in chronic kidney disease: Secondary | ICD-10-CM

## 2012-03-24 DIAGNOSIS — N039 Chronic nephritic syndrome with unspecified morphologic changes: Secondary | ICD-10-CM

## 2012-03-24 DIAGNOSIS — N189 Chronic kidney disease, unspecified: Secondary | ICD-10-CM

## 2012-03-24 LAB — CBC WITH DIFFERENTIAL/PLATELET
BASO%: 1.4 % (ref 0.0–2.0)
Basophils Absolute: 0.1 10*3/uL (ref 0.0–0.1)
EOS%: 3.9 % (ref 0.0–7.0)
HCT: 25.8 % — ABNORMAL LOW (ref 38.4–49.9)
HGB: 8.7 g/dL — ABNORMAL LOW (ref 13.0–17.1)
LYMPH%: 22.7 % (ref 14.0–49.0)
MCH: 32.2 pg (ref 27.2–33.4)
MCHC: 33.8 g/dL (ref 32.0–36.0)
MCV: 95.3 fL (ref 79.3–98.0)
NEUT%: 62.2 % (ref 39.0–75.0)
Platelets: 278 10*3/uL (ref 140–400)
lymph#: 0.9 10*3/uL (ref 0.9–3.3)

## 2012-03-24 MED ORDER — DARBEPOETIN ALFA-POLYSORBATE 300 MCG/0.6ML IJ SOLN
300.0000 ug | Freq: Once | INTRAMUSCULAR | Status: AC
  Administered 2012-03-24: 300 ug via SUBCUTANEOUS
  Filled 2012-03-24: qty 0.6

## 2012-03-31 ENCOUNTER — Other Ambulatory Visit: Payer: Medicare Other | Admitting: Lab

## 2012-03-31 ENCOUNTER — Ambulatory Visit: Payer: Medicare Other

## 2012-04-01 ENCOUNTER — Ambulatory Visit: Payer: Medicare Other | Admitting: Gastroenterology

## 2012-04-14 ENCOUNTER — Other Ambulatory Visit (HOSPITAL_BASED_OUTPATIENT_CLINIC_OR_DEPARTMENT_OTHER): Payer: Medicare Other | Admitting: Lab

## 2012-04-14 ENCOUNTER — Ambulatory Visit (HOSPITAL_BASED_OUTPATIENT_CLINIC_OR_DEPARTMENT_OTHER): Payer: Medicare Other

## 2012-04-14 VITALS — BP 120/60 | HR 66 | Temp 97.6°F

## 2012-04-14 DIAGNOSIS — N039 Chronic nephritic syndrome with unspecified morphologic changes: Secondary | ICD-10-CM

## 2012-04-14 DIAGNOSIS — D631 Anemia in chronic kidney disease: Secondary | ICD-10-CM

## 2012-04-14 DIAGNOSIS — N189 Chronic kidney disease, unspecified: Secondary | ICD-10-CM

## 2012-04-14 LAB — CBC WITH DIFFERENTIAL/PLATELET
BASO%: 0.8 % (ref 0.0–2.0)
Basophils Absolute: 0 10*3/uL (ref 0.0–0.1)
EOS%: 3.4 % (ref 0.0–7.0)
HGB: 9.1 g/dL — ABNORMAL LOW (ref 13.0–17.1)
MCH: 30.5 pg (ref 27.2–33.4)
MCHC: 31.3 g/dL — ABNORMAL LOW (ref 32.0–36.0)
MCV: 97.7 fL (ref 79.3–98.0)
MONO%: 7.9 % (ref 0.0–14.0)
NEUT%: 67.4 % (ref 39.0–75.0)
RDW: 15.1 % — ABNORMAL HIGH (ref 11.0–14.6)
lymph#: 1 10*3/uL (ref 0.9–3.3)

## 2012-04-14 MED ORDER — DARBEPOETIN ALFA-POLYSORBATE 300 MCG/0.6ML IJ SOLN
300.0000 ug | Freq: Once | INTRAMUSCULAR | Status: AC
  Administered 2012-04-14: 300 ug via SUBCUTANEOUS
  Filled 2012-04-14: qty 0.6

## 2012-05-02 ENCOUNTER — Other Ambulatory Visit: Payer: Self-pay | Admitting: Medical Oncology

## 2012-05-02 MED ORDER — ANAGRELIDE HCL 1 MG PO CAPS: ORAL_CAPSULE | ORAL | Status: DC

## 2012-05-05 ENCOUNTER — Ambulatory Visit (HOSPITAL_BASED_OUTPATIENT_CLINIC_OR_DEPARTMENT_OTHER): Payer: Medicare Other

## 2012-05-05 ENCOUNTER — Other Ambulatory Visit (HOSPITAL_BASED_OUTPATIENT_CLINIC_OR_DEPARTMENT_OTHER): Payer: Medicare Other | Admitting: Lab

## 2012-05-05 VITALS — BP 130/68 | HR 70 | Temp 97.3°F

## 2012-05-05 DIAGNOSIS — N039 Chronic nephritic syndrome with unspecified morphologic changes: Secondary | ICD-10-CM

## 2012-05-05 DIAGNOSIS — N189 Chronic kidney disease, unspecified: Secondary | ICD-10-CM

## 2012-05-05 DIAGNOSIS — D631 Anemia in chronic kidney disease: Secondary | ICD-10-CM

## 2012-05-05 LAB — CBC WITH DIFFERENTIAL/PLATELET
Basophils Absolute: 0 10*3/uL (ref 0.0–0.1)
HCT: 31 % — ABNORMAL LOW (ref 38.4–49.9)
HGB: 10 g/dL — ABNORMAL LOW (ref 13.0–17.1)
MCH: 31 pg (ref 27.2–33.4)
MONO#: 0.4 10*3/uL (ref 0.1–0.9)
NEUT%: 64.8 % (ref 39.0–75.0)
WBC: 4.8 10*3/uL (ref 4.0–10.3)
lymph#: 1 10*3/uL (ref 0.9–3.3)

## 2012-05-05 MED ORDER — DARBEPOETIN ALFA-POLYSORBATE 300 MCG/0.6ML IJ SOLN
300.0000 ug | Freq: Once | INTRAMUSCULAR | Status: AC
  Administered 2012-05-05: 300 ug via SUBCUTANEOUS
  Filled 2012-05-05: qty 0.6

## 2012-05-26 ENCOUNTER — Ambulatory Visit: Payer: Medicare Other

## 2012-05-26 ENCOUNTER — Other Ambulatory Visit: Payer: Medicare Other | Admitting: Lab

## 2012-05-27 ENCOUNTER — Telehealth: Payer: Self-pay | Admitting: Oncology

## 2012-05-27 NOTE — Telephone Encounter (Signed)
Returned pt's call re r/s 12/23 appt to 12/30 late PM. lmonvm for pt w/new appt for 12/30. Also lm that pt should keep 06/16/12 appt. DM has no additional availability for January.

## 2012-05-30 ENCOUNTER — Telehealth: Payer: Self-pay | Admitting: Oncology

## 2012-05-30 NOTE — Telephone Encounter (Signed)
Moved 1/13 appt to 1/14 due to call day. Per DM ok w/JH. lmonvm for pt re change w/new d/t and mailed schedule.

## 2012-06-02 ENCOUNTER — Ambulatory Visit (HOSPITAL_BASED_OUTPATIENT_CLINIC_OR_DEPARTMENT_OTHER): Payer: Medicare Other

## 2012-06-02 ENCOUNTER — Other Ambulatory Visit (HOSPITAL_BASED_OUTPATIENT_CLINIC_OR_DEPARTMENT_OTHER): Payer: Medicare Other | Admitting: Lab

## 2012-06-02 VITALS — BP 129/58 | HR 61 | Temp 96.8°F

## 2012-06-02 DIAGNOSIS — D631 Anemia in chronic kidney disease: Secondary | ICD-10-CM

## 2012-06-02 DIAGNOSIS — N189 Chronic kidney disease, unspecified: Secondary | ICD-10-CM

## 2012-06-02 DIAGNOSIS — N039 Chronic nephritic syndrome with unspecified morphologic changes: Secondary | ICD-10-CM

## 2012-06-02 LAB — CBC WITH DIFFERENTIAL/PLATELET
Basophils Absolute: 0 10*3/uL (ref 0.0–0.1)
EOS%: 4.7 % (ref 0.0–7.0)
HCT: 29.3 % — ABNORMAL LOW (ref 38.4–49.9)
HGB: 9.9 g/dL — ABNORMAL LOW (ref 13.0–17.1)
MCH: 32.1 pg (ref 27.2–33.4)
MCV: 95.4 fL (ref 79.3–98.0)
MONO%: 8.9 % (ref 0.0–14.0)
NEUT%: 65 % (ref 39.0–75.0)
Platelets: 327 10*3/uL (ref 140–400)

## 2012-06-02 MED ORDER — DARBEPOETIN ALFA-POLYSORBATE 300 MCG/0.6ML IJ SOLN
300.0000 ug | Freq: Once | INTRAMUSCULAR | Status: AC
  Administered 2012-06-02: 300 ug via SUBCUTANEOUS
  Filled 2012-06-02: qty 0.6

## 2012-06-02 NOTE — Patient Instructions (Signed)
Call MD  For problems

## 2012-06-12 ENCOUNTER — Other Ambulatory Visit (HOSPITAL_COMMUNITY): Payer: Self-pay

## 2012-06-13 ENCOUNTER — Encounter (HOSPITAL_COMMUNITY)
Admission: RE | Admit: 2012-06-13 | Discharge: 2012-06-13 | Disposition: A | Discharge status: Home / Self Care | Payer: Medicare Other | Source: Ambulatory Visit | Attending: Nephrology | Admitting: Nephrology

## 2012-06-13 DIAGNOSIS — N186 End stage renal disease: Secondary | ICD-10-CM | POA: Insufficient documentation

## 2012-06-13 DIAGNOSIS — N039 Chronic nephritic syndrome with unspecified morphologic changes: Secondary | ICD-10-CM | POA: Insufficient documentation

## 2012-06-13 DIAGNOSIS — D631 Anemia in chronic kidney disease: Secondary | ICD-10-CM | POA: Insufficient documentation

## 2012-06-13 MED ORDER — FERUMOXYTOL INJECTION 510 MG/17 ML
INTRAVENOUS | Status: AC
  Administered 2012-06-13: 510 mg via INTRAVENOUS
  Filled 2012-06-13: qty 17

## 2012-06-13 MED ORDER — FERUMOXYTOL INJECTION 510 MG/17 ML
510.0000 mg | INTRAVENOUS | Status: DC
  Administered 2012-06-13: 510 mg via INTRAVENOUS

## 2012-06-13 MED ORDER — DIPHENHYDRAMINE HCL 50 MG/ML IJ SOLN
25.0000 mg | INTRAMUSCULAR | Status: DC
  Administered 2012-06-13: 25 mg via INTRAVENOUS

## 2012-06-13 MED ORDER — SODIUM CHLORIDE 0.9 % IV SOLN: INTRAVENOUS | Status: DC

## 2012-06-13 MED ORDER — DIPHENHYDRAMINE HCL 50 MG/ML IJ SOLN
INTRAMUSCULAR | Status: AC
  Administered 2012-06-13: 25 mg via INTRAVENOUS
  Filled 2012-06-13: qty 1

## 2012-06-16 ENCOUNTER — Ambulatory Visit: Payer: Medicare Other | Admitting: Oncology

## 2012-06-16 ENCOUNTER — Other Ambulatory Visit: Payer: Medicare Other

## 2012-06-16 ENCOUNTER — Ambulatory Visit: Payer: Medicare Other

## 2012-06-17 ENCOUNTER — Other Ambulatory Visit (HOSPITAL_BASED_OUTPATIENT_CLINIC_OR_DEPARTMENT_OTHER): Payer: Medicare Other | Admitting: Lab

## 2012-06-17 ENCOUNTER — Ambulatory Visit (HOSPITAL_BASED_OUTPATIENT_CLINIC_OR_DEPARTMENT_OTHER): Payer: Medicare Other | Admitting: Family

## 2012-06-17 ENCOUNTER — Encounter: Payer: Self-pay | Admitting: Family

## 2012-06-17 ENCOUNTER — Telehealth: Payer: Self-pay | Admitting: Oncology

## 2012-06-17 VITALS — BP 132/50 | HR 60 | Temp 98.0°F | Resp 18 | Ht 70.0 in | Wt 158.0 lb

## 2012-06-17 DIAGNOSIS — D473 Essential (hemorrhagic) thrombocythemia: Secondary | ICD-10-CM

## 2012-06-17 DIAGNOSIS — D631 Anemia in chronic kidney disease: Secondary | ICD-10-CM

## 2012-06-17 DIAGNOSIS — N039 Chronic nephritic syndrome with unspecified morphologic changes: Secondary | ICD-10-CM

## 2012-06-17 DIAGNOSIS — N189 Chronic kidney disease, unspecified: Secondary | ICD-10-CM

## 2012-06-17 LAB — FERRITIN: Ferritin: 299 ng/mL (ref 22–322)

## 2012-06-17 LAB — COMPREHENSIVE METABOLIC PANEL (CC13)
BUN: 73 mg/dL — ABNORMAL HIGH (ref 7.0–26.0)
CO2: 20 mEq/L — ABNORMAL LOW (ref 22–29)
Creatinine: 2.3 mg/dL — ABNORMAL HIGH (ref 0.7–1.3)
Glucose: 119 mg/dl — ABNORMAL HIGH (ref 70–99)
Total Bilirubin: 0.4 mg/dL (ref 0.20–1.20)
Total Protein: 6.3 g/dL — ABNORMAL LOW (ref 6.4–8.3)

## 2012-06-17 LAB — CBC WITH DIFFERENTIAL/PLATELET
Eosinophils Absolute: 0.2 10*3/uL (ref 0.0–0.5)
LYMPH%: 17.4 % (ref 14.0–49.0)
MCH: 31.1 pg (ref 27.2–33.4)
MCHC: 33.4 g/dL (ref 32.0–36.0)
MCV: 93.1 fL (ref 79.3–98.0)
MONO%: 8.4 % (ref 0.0–14.0)
Platelets: 384 10*3/uL (ref 140–400)
RBC: 3.43 10*6/uL — ABNORMAL LOW (ref 4.20–5.82)

## 2012-06-17 LAB — IRON AND TIBC
Iron: 136 ug/dL (ref 42–165)
TIBC: 310 ug/dL (ref 215–435)
UIBC: 174 ug/dL (ref 125–400)

## 2012-06-17 LAB — LACTATE DEHYDROGENASE (CC13): LDH: 137 U/L (ref 125–245)

## 2012-06-17 MED ORDER — DARBEPOETIN ALFA-POLYSORBATE 300 MCG/0.6ML IJ SOLN
300.0000 ug | Freq: Once | INTRAMUSCULAR | Status: AC
  Administered 2012-06-17: 300 ug via SUBCUTANEOUS
  Filled 2012-06-17: qty 0.6

## 2012-06-17 NOTE — Patient Instructions (Signed)
Please contact us at [phone] if you have any questions or concerns. 

## 2012-06-17 NOTE — Progress Notes (Signed)
Patient ID: [name], male   DOB: 1932-01-19, 79 y.o.   MRN: [id number] CSN: [id number]  CC: Marca Ancona, MD  [name], MD  Elana Alm. Thurston Hole, MD  Problem List: [name] is a 79 y.o. Caucasian male with a problem list consisting of:  1. Essential thrombocythemia with diagnosis going back to April 1995. JAK-2 mutation was absent on assay carried out on 03/23/2008. Mild splenomegaly was present on nuclear scan in April 1995. The patient has been treated with aspirin, hydroxyurea, anagrelide and  Aranesp.  Hydroxyurea discontinued on 10/15/2011. Prior to that, he was taking only 500 mg weekly.  Mr. Cull is receiving Feraheme infusions that are being managed by Dr. Arrie Aran.  His last IV Feraheme infusion of 510 mg was on 06/13/2012.   2. History of retinal vein occlusion involving the left eye status post left vitrectomy on 05/05/2003.  3. Coronary artery disease status post CABG x5 on 09/14/2007.  4. Renal insufficiency.  5. Hypertension.  6. Dyslipidemia.  7. History of esophageal stricture.  8. 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  9. Systolic ejection murmur.  10.Hearing impairment.   Dr. Arline Asp and I saw [name] today for follow up of his essential thrombocythemia and anemia associated with chronic renal disease. [name] was last seen by [place] on 02/26/2012. He continues to come in every 3 weeks for CBC and Aranesp 300 mcg subcutaneously whenever the hemoglobin is less than 11. The patient's hemoglobin has, for the most part, been in the 9-10 range with occasionally levels in the 10-11 range, but his values do not exceed 11. The patient has gotten along fairly well. He walks about a mile, 3 times a week. He denies any major symptomatology today, but reports some nasal congestion/sinus pressure that began today.  His reports of GI difficulty during his  last office visit has resolved.  He denies any chest pain, symptoms of stroke, shortness of breath or any other major changes in his clinical status.  [name] is in the process of finding a new PCP.   Past Medical History: Past Medical History  Diagnosis Date  . Chronic kidney disease   . Thrombocythemia   . Esophageal stricture   . Anemia   . Anemia due to chronic renal failure treated with erythropoietin 03/24/2011  . Retinal vein thrombosis, left   . Coronary artery disease   . Hypertension     Surgical History: Past Surgical History  Procedure Date  . Coronary artery bypass graft 09/13/04  . Replacement total knee 04/19/08    right  . Inguinal hernia repair 05/08/01    right    Current Medications: Current Outpatient Prescriptions  Medication Sig Dispense Refill  . acyclovir (ZOVIRAX) 200 MG capsule Take 1 tablet by mouth Daily.      Marland Kitchen anagrelide (AGRYLIN) 1 MG capsule Take 1 capsule daily or as directed  60 capsule  2  . aspirin 81 MG tablet Take 81 mg by mouth daily.        Marland Kitchen atorvastatin (LIPITOR) 20 MG tablet take 1 tablet by mouth once daily  30 tablet  6  . cloNIDine (CATAPRES) 0.3 MG tablet Take 0.3 mg by mouth 3 (three) times daily.        . furosemide (LASIX) 40 MG tablet Take 60 mg by mouth daily.       Marland Kitchen losartan (COZAAR) 25 MG tablet       .  metoprolol succinate (TOPROL-XL) 100 MG 24 hr tablet Take with or immediately following a meal.  60 tablet  PRN  . pantoprazole (PROTONIX) 40 MG tablet take 1 tablet by mouth once daily  30 tablet  5  . psyllium (METAMUCIL) 58.6 % powder Take 1 packet by mouth as needed.        . Tamsulosin HCl (FLOMAX) 0.4 MG CAPS Take by mouth.      . triamcinolone cream (KENALOG) 0.1 % as directed.      . zolpidem (AMBIEN) 5 MG tablet Take 1 tablet by mouth as needed.        Allergies: Allergies  Allergen Reactions  . Norvasc (Amlodipine Besylate) Other (See Comments)    TIA's  . Feraheme (Ferumoxytol) Rash    Family  History: Family History  Problem Relation Age of Onset  . Dementia Mother   . Angina Father     Social History: History  Substance Use Topics  . Smoking status: Former Games developer  . Smokeless tobacco: Former Neurosurgeon    Types: Chew     Comment: quit 30 +yrs ago  . Alcohol Use: No    Review of Systems: 10 Point review of systems was completed and is negative except as noted above.   Physical Exam:   Blood pressure 132/50, pulse 60, temperature 98 F (36.7 C), temperature source Oral, resp. rate 18, height 5\' 10"  (1.778 m), weight 158 lb (71.668 kg).  General appearance: Alert, cooperative, thin frame, no apparent distress Head: Normocephalic, without obvious abnormality, atraumatic, cheilitis at lip corners Eyes: Arcus senilis, PERRLA, EOMI, left eye amblyopia Nose: Nares, septum and mucosa are normal, clear drainage, no sinus tenderness Neck: No adenopathy, supple, symmetrical, trachea midline, thyroid not enlarged, no tenderness Resp: Clear to auscultation bilaterally Cardio: Regular rate and rhythm, S1, S2 normal, 1/6 murmur, no click, rub or gallop GI: Soft, distended, non-tender, hypoactive bowel sounds, no organomegaly Extremities: Extremities normal, atraumatic, no cyanosis or edema, small, scattered areas of hyperpigmentation on bilateral UEs Lymph nodes: Cervical, supraclavicular, and axillary nodes normal Neurologic: Grossly normal, but does have signs of spacticity with dyskinesia   Laboratory Data: Results for orders placed in visit on 06/17/12 (from the past 48 hour(s))  CBC WITH DIFFERENTIAL     Status: Abnormal   Collection Time   06/17/12 10:34 AM      Component Value Range Comment   WBC 4.6  4.0 - 10.3 10e3/uL    NEUT# 3.2  1.5 - 6.5 10e3/uL    HGB 10.7 (*) 13.0 - 17.1 g/dL    HCT 16.1 (*) 09.6 - 49.9 %    Platelets 384  140 - 400 10e3/uL    MCV 93.1  79.3 - 98.0 fL    MCH 31.1  27.2 - 33.4 pg    MCHC 33.4  32.0 - 36.0 g/dL    RBC 0.45 (*) 4.09 - 5.82  10e6/uL    RDW 14.4  11.0 - 14.6 %    lymph# 0.8 (*) 0.9 - 3.3 10e3/uL    MONO# 0.4  0.1 - 0.9 10e3/uL    Eosinophils Absolute 0.2  0.0 - 0.5 10e3/uL    Basophils Absolute 0.0  0.0 - 0.1 10e3/uL    NEUT% 68.8  39.0 - 75.0 %    LYMPH% 17.4  14.0 - 49.0 %    MONO% 8.4  0.0 - 14.0 %    EOS% 4.6  0.0 - 7.0 %    BASO% 0.8  0.0 - 2.0 %  COMPREHENSIVE METABOLIC PANEL (CC13)     Status: Abnormal   Collection Time   06/17/12 10:34 AM      Component Value Range Comment   Sodium 139  136 - 145 mEq/L    Potassium 4.8  3.5 - 5.1 mEq/L    Chloride 109 (*) 98 - 107 mEq/L    CO2 20 (*) 22 - 29 mEq/L    Glucose 119 (*) 70 - 99 mg/dl    BUN 16.1 (*) 7.0 - 09.6 mg/dL    Creatinine 2.3 (*) 0.7 - 1.3 mg/dL    Total Bilirubin 0.45  0.20 - 1.20 mg/dL    Alkaline Phosphatase 102  40 - 150 U/L    AST 11  5 - 34 U/L    ALT 8  0 - 55 U/L    Total Protein 6.3 (*) 6.4 - 8.3 g/dL    Albumin 3.2 (*) 3.5 - 5.0 g/dL    Calcium 8.7  8.4 - 40.9 mg/dL   LACTATE DEHYDROGENASE (CC13)     Status: Normal   Collection Time   06/17/12 10:34 AM      Component Value Range Comment   LDH 137  125 - 245 U/L     Procedures:   1. Nuclear cardiac stress test on 02/25/2008 showed cardiac enlargement with normal ejection fraction but with inferior septal hypokinesia, no significant ischemia.  Imaging Studies: 1. Chest x-ray, 2 views, from 04/13/2008 showed no active disease. The patient was status post median sternotomy and CABG.    Impression/Plan: Mr. Lyerly received Aranesp 300 mcg subcutaneously today as his hemoglobin was 10.7. We will check CBC every 3 weeks and plan to give him Aranesp 300 mcg subcutaneously whenever his hemoglobin is less than 11. Mr. Herrington will continue on Anagrelide 1 mg daily.  We will plan to see Mr. Hun again in approximately 4 months (10/20/2012) at which time we will check CBC, chemistries, and iron studies.  Mr. Buhl is encouraged to contact us in the interim if he has any questions or  concerns.   Larina Bras, NP-C 06/17/2012, 11:50 AM

## 2012-06-17 NOTE — Telephone Encounter (Signed)
appts made and printed for pt aom °

## 2012-06-19 ENCOUNTER — Encounter (HOSPITAL_COMMUNITY)
Admission: RE | Admit: 2012-06-19 | Discharge: 2012-06-19 | Disposition: A | Discharge status: Home / Self Care | Payer: Medicare Other | Source: Ambulatory Visit | Attending: Nephrology | Admitting: Nephrology

## 2012-06-19 MED ORDER — DIPHENHYDRAMINE HCL 50 MG/ML IJ SOLN
25.0000 mg | INTRAMUSCULAR | Status: AC
  Administered 2012-06-19: 25 mg via INTRAVENOUS

## 2012-06-19 MED ORDER — FERUMOXYTOL INJECTION 510 MG/17 ML
INTRAVENOUS | Status: AC
  Administered 2012-06-19: 510 mg via INTRAVENOUS
  Filled 2012-06-19: qty 17

## 2012-06-19 MED ORDER — FERUMOXYTOL INJECTION 510 MG/17 ML
510.0000 mg | INTRAVENOUS | Status: AC
  Administered 2012-06-19: 510 mg via INTRAVENOUS

## 2012-06-19 MED ORDER — SODIUM CHLORIDE 0.9 % IV SOLN
INTRAVENOUS | Status: AC
  Administered 2012-06-19: 09:00:00 via INTRAVENOUS

## 2012-06-19 MED ORDER — DIPHENHYDRAMINE HCL 50 MG/ML IJ SOLN
INTRAMUSCULAR | Status: AC
  Administered 2012-06-19: 25 mg via INTRAVENOUS
  Filled 2012-06-19: qty 1

## 2012-06-30 ENCOUNTER — Other Ambulatory Visit: Payer: Medicare Other | Admitting: Lab

## 2012-06-30 ENCOUNTER — Ambulatory Visit: Payer: Medicare Other | Admitting: Oncology

## 2012-07-07 ENCOUNTER — Ambulatory Visit: Payer: Medicare Other

## 2012-07-07 ENCOUNTER — Other Ambulatory Visit (HOSPITAL_BASED_OUTPATIENT_CLINIC_OR_DEPARTMENT_OTHER): Payer: Medicare Other

## 2012-07-07 ENCOUNTER — Ambulatory Visit (HOSPITAL_BASED_OUTPATIENT_CLINIC_OR_DEPARTMENT_OTHER): Payer: Medicare Other

## 2012-07-07 VITALS — BP 135/60 | HR 71 | Temp 97.4°F

## 2012-07-07 DIAGNOSIS — D473 Essential (hemorrhagic) thrombocythemia: Secondary | ICD-10-CM

## 2012-07-07 DIAGNOSIS — D631 Anemia in chronic kidney disease: Secondary | ICD-10-CM

## 2012-07-07 DIAGNOSIS — N189 Chronic kidney disease, unspecified: Secondary | ICD-10-CM

## 2012-07-07 DIAGNOSIS — N039 Chronic nephritic syndrome with unspecified morphologic changes: Secondary | ICD-10-CM

## 2012-07-07 LAB — CBC WITH DIFFERENTIAL/PLATELET
Basophils Absolute: 0 10*3/uL (ref 0.0–0.1)
Eosinophils Absolute: 0.1 10*3/uL (ref 0.0–0.5)
HGB: 10.1 g/dL — ABNORMAL LOW (ref 13.0–17.1)
MCV: 92.9 fL (ref 79.3–98.0)
MONO#: 0.6 10*3/uL (ref 0.1–0.9)
NEUT#: 5.2 10*3/uL (ref 1.5–6.5)
RBC: 3.37 10*6/uL — ABNORMAL LOW (ref 4.20–5.82)
RDW: 15.6 % — ABNORMAL HIGH (ref 11.0–14.6)
WBC: 6.9 10*3/uL (ref 4.0–10.3)
lymph#: 0.9 10*3/uL (ref 0.9–3.3)

## 2012-07-07 MED ORDER — DARBEPOETIN ALFA-POLYSORBATE 300 MCG/0.6ML IJ SOLN
300.0000 ug | Freq: Once | INTRAMUSCULAR | Status: AC
  Administered 2012-07-07: 300 ug via SUBCUTANEOUS
  Filled 2012-07-07: qty 0.6

## 2012-07-28 ENCOUNTER — Other Ambulatory Visit (HOSPITAL_BASED_OUTPATIENT_CLINIC_OR_DEPARTMENT_OTHER): Payer: Medicare Other

## 2012-07-28 ENCOUNTER — Telehealth: Payer: Self-pay | Admitting: *Deleted

## 2012-07-28 ENCOUNTER — Ambulatory Visit (HOSPITAL_BASED_OUTPATIENT_CLINIC_OR_DEPARTMENT_OTHER): Payer: Medicare Other

## 2012-07-28 VITALS — BP 133/67 | HR 66 | Temp 97.2°F

## 2012-07-28 DIAGNOSIS — D473 Essential (hemorrhagic) thrombocythemia: Secondary | ICD-10-CM

## 2012-07-28 DIAGNOSIS — D631 Anemia in chronic kidney disease: Secondary | ICD-10-CM

## 2012-07-28 LAB — CBC WITH DIFFERENTIAL/PLATELET
Basophils Absolute: 0.1 10*3/uL (ref 0.0–0.1)
Eosinophils Absolute: 0.2 10*3/uL (ref 0.0–0.5)
HCT: 30.5 % — ABNORMAL LOW (ref 38.4–49.9)
HGB: 10.1 g/dL — ABNORMAL LOW (ref 13.0–17.1)
LYMPH%: 19.5 % (ref 14.0–49.0)
MCV: 93 fL (ref 79.3–98.0)
MONO#: 0.5 10*3/uL (ref 0.1–0.9)
MONO%: 10.3 % (ref 0.0–14.0)
NEUT#: 2.9 10*3/uL (ref 1.5–6.5)
Platelets: 337 10*3/uL (ref 140–400)
RBC: 3.28 10*6/uL — ABNORMAL LOW (ref 4.20–5.82)
WBC: 4.6 10*3/uL (ref 4.0–10.3)

## 2012-07-28 MED ORDER — DARBEPOETIN ALFA-POLYSORBATE 300 MCG/0.6ML IJ SOLN
300.0000 ug | Freq: Once | INTRAMUSCULAR | Status: AC
  Administered 2012-07-28: 300 ug via SUBCUTANEOUS
  Filled 2012-07-28: qty 0.6

## 2012-07-28 NOTE — Telephone Encounter (Signed)
Patient forgot appointment.  Will come now.  Checked with lab and they agreed.

## 2012-07-29 ENCOUNTER — Other Ambulatory Visit: Payer: Self-pay | Admitting: Cardiology

## 2012-07-29 NOTE — Telephone Encounter (Signed)
Opened by accident

## 2012-07-31 ENCOUNTER — Other Ambulatory Visit: Payer: Self-pay | Admitting: *Deleted

## 2012-07-31 MED ORDER — PANTOPRAZOLE SODIUM 40 MG PO TBEC: 40.0000 mg | DELAYED_RELEASE_TABLET | Freq: Every day | ORAL | Status: DC

## 2012-08-07 ENCOUNTER — Other Ambulatory Visit: Payer: Self-pay | Admitting: Cardiology

## 2012-08-18 ENCOUNTER — Other Ambulatory Visit (HOSPITAL_BASED_OUTPATIENT_CLINIC_OR_DEPARTMENT_OTHER): Payer: Medicare Other | Admitting: Lab

## 2012-08-18 ENCOUNTER — Ambulatory Visit: Payer: Medicare Other

## 2012-08-18 DIAGNOSIS — D631 Anemia in chronic kidney disease: Secondary | ICD-10-CM

## 2012-08-18 LAB — CBC WITH DIFFERENTIAL/PLATELET
BASO%: 1 % (ref 0.0–2.0)
Eosinophils Absolute: 0.3 10*3/uL (ref 0.0–0.5)
HCT: 34.2 % — ABNORMAL LOW (ref 38.4–49.9)
LYMPH%: 19.5 % (ref 14.0–49.0)
MCHC: 33.2 g/dL (ref 32.0–36.0)
MCV: 95.3 fL (ref 79.3–98.0)
MONO#: 0.4 10*3/uL (ref 0.1–0.9)
MONO%: 8.1 % (ref 0.0–14.0)
NEUT%: 66 % (ref 39.0–75.0)
Platelets: 393 10*3/uL (ref 140–400)
WBC: 5.4 10*3/uL (ref 4.0–10.3)

## 2012-08-18 MED ORDER — DARBEPOETIN ALFA-POLYSORBATE 300 MCG/0.6ML IJ SOLN: 300.0000 ug | Freq: Once | INTRAMUSCULAR | Status: DC

## 2012-09-08 ENCOUNTER — Ambulatory Visit (HOSPITAL_BASED_OUTPATIENT_CLINIC_OR_DEPARTMENT_OTHER): Payer: Medicare Other

## 2012-09-08 ENCOUNTER — Other Ambulatory Visit (HOSPITAL_BASED_OUTPATIENT_CLINIC_OR_DEPARTMENT_OTHER): Payer: Medicare Other | Admitting: Lab

## 2012-09-08 VITALS — BP 131/57 | HR 64 | Temp 97.2°F

## 2012-09-08 DIAGNOSIS — N189 Chronic kidney disease, unspecified: Secondary | ICD-10-CM

## 2012-09-08 DIAGNOSIS — D473 Essential (hemorrhagic) thrombocythemia: Secondary | ICD-10-CM

## 2012-09-08 DIAGNOSIS — D631 Anemia in chronic kidney disease: Secondary | ICD-10-CM

## 2012-09-08 LAB — CBC WITH DIFFERENTIAL/PLATELET
BASO%: 0.8 % (ref 0.0–2.0)
EOS%: 3.8 % (ref 0.0–7.0)
HCT: 28 % — ABNORMAL LOW (ref 38.4–49.9)
LYMPH%: 17.1 % (ref 14.0–49.0)
MCH: 32 pg (ref 27.2–33.4)
MCHC: 34.3 g/dL (ref 32.0–36.0)
NEUT%: 68.4 % (ref 39.0–75.0)
Platelets: 388 10*3/uL (ref 140–400)
RBC: 3.01 10*6/uL — ABNORMAL LOW (ref 4.20–5.82)

## 2012-09-08 MED ORDER — DARBEPOETIN ALFA-POLYSORBATE 300 MCG/0.6ML IJ SOLN
300.0000 ug | Freq: Once | INTRAMUSCULAR | Status: AC
  Administered 2012-09-08: 300 ug via SUBCUTANEOUS
  Filled 2012-09-08: qty 0.6

## 2012-09-29 ENCOUNTER — Other Ambulatory Visit (HOSPITAL_BASED_OUTPATIENT_CLINIC_OR_DEPARTMENT_OTHER): Payer: Medicare Other | Admitting: Lab

## 2012-09-29 ENCOUNTER — Ambulatory Visit (HOSPITAL_BASED_OUTPATIENT_CLINIC_OR_DEPARTMENT_OTHER): Payer: Medicare Other

## 2012-09-29 VITALS — BP 123/53 | HR 62 | Temp 97.4°F

## 2012-09-29 DIAGNOSIS — D473 Essential (hemorrhagic) thrombocythemia: Secondary | ICD-10-CM

## 2012-09-29 DIAGNOSIS — N189 Chronic kidney disease, unspecified: Secondary | ICD-10-CM

## 2012-09-29 DIAGNOSIS — D631 Anemia in chronic kidney disease: Secondary | ICD-10-CM

## 2012-09-29 DIAGNOSIS — N039 Chronic nephritic syndrome with unspecified morphologic changes: Secondary | ICD-10-CM

## 2012-09-29 LAB — CBC WITH DIFFERENTIAL/PLATELET
BASO%: 1.4 % (ref 0.0–2.0)
EOS%: 3.8 % (ref 0.0–7.0)
MCH: 31.6 pg (ref 27.2–33.4)
MCHC: 33.2 g/dL (ref 32.0–36.0)
RBC: 3.07 10*6/uL — ABNORMAL LOW (ref 4.20–5.82)
RDW: 15.5 % — ABNORMAL HIGH (ref 11.0–14.6)
lymph#: 0.9 10*3/uL (ref 0.9–3.3)

## 2012-09-29 MED ORDER — DARBEPOETIN ALFA-POLYSORBATE 300 MCG/0.6ML IJ SOLN
300.0000 ug | Freq: Once | INTRAMUSCULAR | Status: AC
  Administered 2012-09-29: 300 ug via SUBCUTANEOUS
  Filled 2012-09-29: qty 0.6

## 2012-10-10 ENCOUNTER — Other Ambulatory Visit: Payer: Self-pay | Admitting: Cardiology

## 2012-10-16 ENCOUNTER — Other Ambulatory Visit (HOSPITAL_COMMUNITY): Payer: Self-pay | Admitting: *Deleted

## 2012-10-17 ENCOUNTER — Encounter (HOSPITAL_COMMUNITY)
Admission: RE | Admit: 2012-10-17 | Discharge: 2012-10-17 | Disposition: A | Discharge status: Home / Self Care | Payer: Medicare Other | Source: Ambulatory Visit | Attending: Nephrology | Admitting: Nephrology

## 2012-10-17 ENCOUNTER — Emergency Department (HOSPITAL_COMMUNITY)
Admission: EM | Admit: 2012-10-17 | Discharge: 2012-10-17 | Disposition: A | Discharge status: Home / Self Care | Payer: Medicare Other

## 2012-10-17 NOTE — Progress Notes (Signed)
Patient arrived for Aranesp injection appt. States that he received Aranesp at [facility] every 3 weeks, ordered by Dr. Arline Asp. Reports that he thought he was here today to receive IV iron under Dr. Arrie Aran. Called [facility]. Spoke with [employer]. Orders received to DC Aranesp orders from Dr. Arrie Aran. Crystal will inform Dr. Arrie Aran of conflict.

## 2012-10-20 ENCOUNTER — Telehealth: Payer: Self-pay | Admitting: Oncology

## 2012-10-20 ENCOUNTER — Other Ambulatory Visit (HOSPITAL_BASED_OUTPATIENT_CLINIC_OR_DEPARTMENT_OTHER): Payer: Medicare Other | Admitting: Lab

## 2012-10-20 ENCOUNTER — Ambulatory Visit (HOSPITAL_BASED_OUTPATIENT_CLINIC_OR_DEPARTMENT_OTHER): Payer: Medicare Other

## 2012-10-20 ENCOUNTER — Ambulatory Visit (HOSPITAL_BASED_OUTPATIENT_CLINIC_OR_DEPARTMENT_OTHER): Payer: Medicare Other | Admitting: Oncology

## 2012-10-20 ENCOUNTER — Encounter: Payer: Self-pay | Admitting: Oncology

## 2012-10-20 VITALS — BP 142/49 | HR 62 | Temp 97.6°F | Resp 18 | Ht 70.0 in | Wt 157.3 lb

## 2012-10-20 DIAGNOSIS — D631 Anemia in chronic kidney disease: Secondary | ICD-10-CM

## 2012-10-20 DIAGNOSIS — N039 Chronic nephritic syndrome with unspecified morphologic changes: Secondary | ICD-10-CM

## 2012-10-20 DIAGNOSIS — D473 Essential (hemorrhagic) thrombocythemia: Secondary | ICD-10-CM

## 2012-10-20 DIAGNOSIS — N189 Chronic kidney disease, unspecified: Secondary | ICD-10-CM

## 2012-10-20 LAB — COMPREHENSIVE METABOLIC PANEL (CC13)
Alkaline Phosphatase: 88 U/L (ref 40–150)
BUN: 92.5 mg/dL — ABNORMAL HIGH (ref 7.0–26.0)
Glucose: 109 mg/dl — ABNORMAL HIGH (ref 70–99)
Sodium: 138 mEq/L (ref 136–145)
Total Bilirubin: 0.35 mg/dL (ref 0.20–1.20)
Total Protein: 6.3 g/dL — ABNORMAL LOW (ref 6.4–8.3)

## 2012-10-20 LAB — CBC WITH DIFFERENTIAL/PLATELET
Basophils Absolute: 0.1 10*3/uL (ref 0.0–0.1)
EOS%: 3.6 % (ref 0.0–7.0)
Eosinophils Absolute: 0.2 10*3/uL (ref 0.0–0.5)
HGB: 10.2 g/dL — ABNORMAL LOW (ref 13.0–17.1)
MCH: 31.5 pg (ref 27.2–33.4)
NEUT#: 3.6 10*3/uL (ref 1.5–6.5)
RDW: 14.9 % — ABNORMAL HIGH (ref 11.0–14.6)
WBC: 5.1 10*3/uL (ref 4.0–10.3)
lymph#: 0.8 10*3/uL — ABNORMAL LOW (ref 0.9–3.3)

## 2012-10-20 LAB — IRON AND TIBC
Iron: 35 ug/dL — ABNORMAL LOW (ref 42–165)
UIBC: 214 ug/dL (ref 125–400)

## 2012-10-20 MED ORDER — DARBEPOETIN ALFA-POLYSORBATE 300 MCG/0.6ML IJ SOLN
300.0000 ug | Freq: Once | INTRAMUSCULAR | Status: AC
  Administered 2012-10-20: 300 ug via SUBCUTANEOUS
  Filled 2012-10-20: qty 0.6

## 2012-10-20 NOTE — Progress Notes (Signed)
This office note has been dictated.  #[id number]

## 2012-10-20 NOTE — Telephone Encounter (Signed)
gv and printed appt sched and avs for pt for June thru Nov

## 2012-10-20 NOTE — Progress Notes (Signed)
CC:   Marca Ancona, MD [name], M.D. [name], M.D.  PROBLEM LIST:  1. Essential thrombocythemia with diagnosis going back to April 1995.  JAK-2 mutation was absent on assay carried out on 03/23/2008. Mild  splenomegaly was present on nuclear scan in April 1995.In the past the patient had been treated with aspirin, hydroxyurea, anagrelide and Aranesp.  Because of thrombocytopenia, hydroxyurea was discontinued on 10/15/2011.  The dose of anagrelide was also decreased at about that time.  The patient continues on aspirin, anagrelide and Aranesp.    2. History of retinal vein occlusion involving the left eye status  post left vitrectomy on 05/05/2003.  3. Coronary artery disease status post CABG x5 on 09/14/2007.  4. Renal insufficiency.  5. Hypertension.  6. Dyslipidemia.  7. History of esophageal stricture.  8. Anemia secondary to chronic renal disease with possible  contributions from the patient's underlying myeloproliferative disorder  and iron deficiency. The patient received IV Feraheme 510 mg on 03/04/2012, 06/13/2012 and 06/19/2012. 9. Systolic ejection murmur.  10. Hearing impairment.    TREATMENT PROGRAM: 1. Aspirin 81 mg by mouth daily. 2. Anagrelide (Agrylin) 1 mg by mouth daily. 3. Aranesp 300 mcg subcu every 3 weeks for hemoglobin less than 11.   SMOKING HISTORY: The patient smoked 2 packs cigarettes a day for about  30 years but stopped smoking 30 years ago.   HISTORY:  [name] was seen today for followup of his essential thrombocythemia and anemia associated with chronic renal disease.  [name] was last seen by [place] on 06/17/2012 and 02/26/2012.  His condition has remained quite stable on the current program.  [name] did receive IV Feraheme 510 mg on 03/04/2012, 06/13/2012 and 06/19/2012.  He seems to be running a slightly higher hemoglobin since those iron infusions.  Over the past few months his hemoglobin has run from 9.6 up to  11.4, the latter being on 08/18/2012.  The patient is without complaints today.  He has had no medical issues.  PHYSICAL EXAMINATION:  There have been no significant changes.  [name] will be turning 60 on 12/08/2012.  Weight is 157 pounds 4.8 ounces.  Height 5 feet 10 inches.  Body surface area 1.88 sq m.  Blood pressure 142/49.  Other vital signs are normal.  There is no scleral icterus.  Mouth and pharynx are benign.  There is no peripheral adenopathy palpable.  Lungs are clear to percussion and auscultation. Cardiac exam regular rhythm with soft systolic ejection murmur.  Abdomen is soft, nontender with no organomegaly or masses palpable. Extremities:  No peripheral edema or clubbing.  Neurologic exam is normal.  The patient does not have a central catheter or port.  LABORATORY DATA:  Today, white count 5.1, ANC 3.6, hemoglobin 10.2, hematocrit 30.8, platelets 358,000.  LDH today was 143.  The rest of the chemistries and iron studies are pending.  On 06/17/2012, BUN was 73.0, creatinine 2.3, albumin 3.2, LDH 137.  Ferritin was 299 and iron saturation 44% on 06/17/2012.  IMAGING STUDIES:  1. Chest x-ray, 2 views, from 04/13/2008 showed no active disease.  The patient was status post median sternotomy and CABG.   IMPRESSION AND PLAN:  [name] continues to do fairly well on the current treatment program.  [name] will receive Aranesp today 300 mcg subcu.  We are continuing to check CBCs every 3 weeks and we will give him Aranesp 300 mcg subcu whenever the hemoglobin is less than 11. Platelet counts have  been quite satisfactory usually in the 300-400,000 range.  [name] will receive IV iron as needed in order to augment his response to Aranesp.  We will plan to see him again in 4 months, at which time we will check CBC, chemistries and iron studies.    ______________________________ Samul Dada, M.D. DSM/MEDQ  D:  10/20/2012  T:  10/20/2012  Job:  [id number]

## 2012-10-23 ENCOUNTER — Encounter: Payer: Self-pay | Admitting: Oncology

## 2012-10-23 NOTE — Progress Notes (Signed)
This patient is being scheduled for IV Feraheme 1020 mg IV on or about 11/06/2012.  Ferritin on 10/20/2012 was 109, down from 299 on 06/17/2012.  This patient received IV Feraheme 510 mg IV on 06/13/2012 and 06/19/2012.   Patient has renal insufficiency.

## 2012-10-24 ENCOUNTER — Telehealth: Payer: Self-pay | Admitting: Oncology

## 2012-10-24 ENCOUNTER — Telehealth: Payer: Self-pay | Admitting: *Deleted

## 2012-10-24 ENCOUNTER — Telehealth: Payer: Self-pay | Admitting: Medical Oncology

## 2012-10-24 NOTE — Telephone Encounter (Signed)
Per staff message and POF I have scheduled appts.  JMW  

## 2012-10-24 NOTE — Telephone Encounter (Signed)
I called pt to inform him that his ferritin has dropped. Dr. Arline Asp is scheduling him to get IV feraheme. The schedulers will call him with a date and time. He voiced understanding.

## 2012-10-24 NOTE — Telephone Encounter (Signed)
, °

## 2012-11-09 ENCOUNTER — Other Ambulatory Visit: Payer: Self-pay | Admitting: Cardiology

## 2012-11-10 ENCOUNTER — Ambulatory Visit: Payer: Medicare Other

## 2012-11-10 ENCOUNTER — Other Ambulatory Visit (HOSPITAL_BASED_OUTPATIENT_CLINIC_OR_DEPARTMENT_OTHER): Payer: Medicare Other

## 2012-11-10 ENCOUNTER — Ambulatory Visit (HOSPITAL_BASED_OUTPATIENT_CLINIC_OR_DEPARTMENT_OTHER): Payer: Medicare Other

## 2012-11-10 VITALS — BP 111/63 | HR 66 | Temp 98.3°F | Resp 20

## 2012-11-10 DIAGNOSIS — D473 Essential (hemorrhagic) thrombocythemia: Secondary | ICD-10-CM

## 2012-11-10 DIAGNOSIS — D631 Anemia in chronic kidney disease: Secondary | ICD-10-CM

## 2012-11-10 DIAGNOSIS — N189 Chronic kidney disease, unspecified: Secondary | ICD-10-CM

## 2012-11-10 DIAGNOSIS — N039 Chronic nephritic syndrome with unspecified morphologic changes: Secondary | ICD-10-CM

## 2012-11-10 LAB — CBC WITH DIFFERENTIAL/PLATELET
Basophils Absolute: 0.1 10*3/uL (ref 0.0–0.1)
Eosinophils Absolute: 0.2 10*3/uL (ref 0.0–0.5)
HCT: 29.8 % — ABNORMAL LOW (ref 38.4–49.9)
HGB: 10.1 g/dL — ABNORMAL LOW (ref 13.0–17.1)
LYMPH%: 13.8 % — ABNORMAL LOW (ref 14.0–49.0)
MONO#: 0.4 10*3/uL (ref 0.1–0.9)
NEUT#: 4.2 10*3/uL (ref 1.5–6.5)
NEUT%: 74.3 % (ref 39.0–75.0)
Platelets: 373 10*3/uL (ref 140–400)
WBC: 5.6 10*3/uL (ref 4.0–10.3)

## 2012-11-10 MED ORDER — FERUMOXYTOL INJECTION 510 MG/17 ML
1020.0000 mg | Freq: Once | INTRAVENOUS | Status: AC
  Administered 2012-11-10: 1020 mg via INTRAVENOUS
  Filled 2012-11-10: qty 34

## 2012-11-10 MED ORDER — SODIUM CHLORIDE 0.9 % IV SOLN
INTRAVENOUS | Status: DC
  Administered 2012-11-10: 16:00:00 via INTRAVENOUS

## 2012-11-10 MED ORDER — DARBEPOETIN ALFA-POLYSORBATE 300 MCG/0.6ML IJ SOLN
300.0000 ug | Freq: Once | INTRAMUSCULAR | Status: AC
  Administered 2012-11-10: 300 ug via SUBCUTANEOUS
  Filled 2012-11-10: qty 0.6

## 2012-11-10 MED ORDER — DIPHENHYDRAMINE HCL 50 MG/ML IJ SOLN
25.0000 mg | Freq: Once | INTRAMUSCULAR | Status: AC
  Administered 2012-11-10: 25 mg via INTRAVENOUS

## 2012-11-10 MED ORDER — METHYLPREDNISOLONE SODIUM SUCC 40 MG IJ SOLR
20.0000 mg | Freq: Once | INTRAMUSCULAR | Status: AC
  Administered 2012-11-10: 20 mg via INTRAVENOUS

## 2012-11-10 NOTE — Patient Instructions (Addendum)
Ferumoxytol injection What is this medicine? FERUMOXYTOL is an iron complex. Iron is used to make healthy red blood cells, which carry oxygen and nutrients throughout the body. This medicine is used to treat iron deficiency anemia in people with chronic kidney disease. This medicine may be used for other purposes; ask your health care provider or pharmacist if you have questions. What should I tell my health care provider before I take this medicine? They need to know if you have any of these conditions: -anemia not caused by low iron levels -high levels of iron in the blood -magnetic resonance imaging (MRI) test scheduled -an unusual or allergic reaction to iron, other medicines, foods, dyes, or preservatives -pregnant or trying to get pregnant -breast-feeding How should I use this medicine? This medicine is for infusion into a vein. It is given by a health care professional in a hospital or clinic setting. Talk to your pediatrician regarding the use of this medicine in children. Special care may be needed. Overdosage: If you think you've taken too much of this medicine contact a poison control center or emergency room at once. Overdosage: If you think you have taken too much of this medicine contact a poison control center or emergency room at once. NOTE: This medicine is only for you. Do not share this medicine with others. What if I miss a dose? It is important not to miss your dose. Call your doctor or health care professional if you are unable to keep an appointment. What may interact with this medicine? This medicine may interact with the following medications: -other iron products This list may not describe all possible interactions. Give your health care provider a list of all the medicines, herbs, non-prescription drugs, or dietary supplements you use. Also tell them if you smoke, drink alcohol, or use illegal drugs. Some items may interact with your medicine. What should I watch  for while using this medicine? Visit your doctor or healthcare professional regularly. Tell your doctor or healthcare professional if your symptoms do not start to get better or if they get worse. You may need blood work done while you are taking this medicine. You may need to follow a special diet. Talk to your doctor. Foods that contain iron include: whole grains/cereals, dried fruits, beans, or peas, leafy green vegetables, and organ meats (liver, kidney). What side effects may I notice from receiving this medicine? Side effects that you should report to your doctor or health care professional as soon as possible: -allergic reactions like skin rash, itching or hives, swelling of the face, lips, or tongue -breathing problems -changes in blood pressure -feeling faint or lightheaded, falls -fever or chills -flushing, sweating, or hot feelings -swelling of the ankles or feet Side effects that usually do not require medical attention (Report these to your doctor or health care professional if they continue or are bothersome.): -diarrhea -headache -nausea, vomiting -stomach pain This list may not describe all possible side effects. Call your doctor for medical advice about side effects. You may report side effects to FDA at 1-800-FDA-1088. Where should I keep my medicine? This drug is given in a hospital or clinic and will not be stored at home. NOTE: This sheet is a summary. It may not cover all possible information. If you have questions about this medicine, talk to your doctor, pharmacist, or health care provider.  2013, [employer]vier/Gold Standard. (02/11/2008 9:48:25 PM)  

## 2012-11-20 ENCOUNTER — Other Ambulatory Visit: Payer: Self-pay | Admitting: Cardiology

## 2012-11-21 ENCOUNTER — Other Ambulatory Visit: Payer: Self-pay | Admitting: *Deleted

## 2012-11-21 DIAGNOSIS — D473 Essential (hemorrhagic) thrombocythemia: Secondary | ICD-10-CM

## 2012-11-21 MED ORDER — ANAGRELIDE HCL 1 MG PO CAPS: ORAL_CAPSULE | ORAL | Status: DC

## 2012-12-01 ENCOUNTER — Other Ambulatory Visit (HOSPITAL_BASED_OUTPATIENT_CLINIC_OR_DEPARTMENT_OTHER): Payer: Medicare Other | Admitting: Lab

## 2012-12-01 ENCOUNTER — Ambulatory Visit (HOSPITAL_BASED_OUTPATIENT_CLINIC_OR_DEPARTMENT_OTHER): Payer: Medicare Other

## 2012-12-01 VITALS — BP 143/56 | HR 65 | Temp 98.3°F

## 2012-12-01 DIAGNOSIS — N039 Chronic nephritic syndrome with unspecified morphologic changes: Secondary | ICD-10-CM

## 2012-12-01 DIAGNOSIS — N189 Chronic kidney disease, unspecified: Secondary | ICD-10-CM

## 2012-12-01 DIAGNOSIS — D631 Anemia in chronic kidney disease: Secondary | ICD-10-CM

## 2012-12-01 DIAGNOSIS — D473 Essential (hemorrhagic) thrombocythemia: Secondary | ICD-10-CM

## 2012-12-01 LAB — CBC WITH DIFFERENTIAL/PLATELET
BASO%: 0.9 % (ref 0.0–2.0)
EOS%: 3.8 % (ref 0.0–7.0)
HCT: 30.6 % — ABNORMAL LOW (ref 38.4–49.9)
LYMPH%: 18.3 % (ref 14.0–49.0)
MCH: 31.4 pg (ref 27.2–33.4)
MCHC: 33.4 g/dL (ref 32.0–36.0)
NEUT%: 69.9 % (ref 39.0–75.0)
Platelets: 366 10*3/uL (ref 140–400)

## 2012-12-01 MED ORDER — DARBEPOETIN ALFA-POLYSORBATE 300 MCG/0.6ML IJ SOLN
300.0000 ug | Freq: Once | INTRAMUSCULAR | Status: AC
  Administered 2012-12-01: 300 ug via SUBCUTANEOUS
  Filled 2012-12-01: qty 0.6

## 2012-12-22 ENCOUNTER — Other Ambulatory Visit (HOSPITAL_BASED_OUTPATIENT_CLINIC_OR_DEPARTMENT_OTHER): Payer: Medicare Other | Admitting: Lab

## 2012-12-22 ENCOUNTER — Ambulatory Visit (HOSPITAL_BASED_OUTPATIENT_CLINIC_OR_DEPARTMENT_OTHER): Payer: Medicare Other

## 2012-12-22 VITALS — BP 145/74 | HR 60 | Temp 98.0°F

## 2012-12-22 DIAGNOSIS — N039 Chronic nephritic syndrome with unspecified morphologic changes: Secondary | ICD-10-CM

## 2012-12-22 DIAGNOSIS — D631 Anemia in chronic kidney disease: Secondary | ICD-10-CM

## 2012-12-22 DIAGNOSIS — N189 Chronic kidney disease, unspecified: Secondary | ICD-10-CM

## 2012-12-22 LAB — CBC WITH DIFFERENTIAL/PLATELET
BASO%: 1.1 % (ref 0.0–2.0)
EOS%: 3.9 % (ref 0.0–7.0)
MCH: 31 pg (ref 27.2–33.4)
MCHC: 32.4 g/dL (ref 32.0–36.0)
NEUT%: 70.1 % (ref 39.0–75.0)
RDW: 15.1 % — ABNORMAL HIGH (ref 11.0–14.6)
lymph#: 0.9 10*3/uL (ref 0.9–3.3)

## 2012-12-22 MED ORDER — DARBEPOETIN ALFA-POLYSORBATE 300 MCG/0.6ML IJ SOLN
300.0000 ug | Freq: Once | INTRAMUSCULAR | Status: AC
  Administered 2012-12-22: 300 ug via SUBCUTANEOUS
  Filled 2012-12-22: qty 0.6

## 2013-01-12 ENCOUNTER — Ambulatory Visit (HOSPITAL_BASED_OUTPATIENT_CLINIC_OR_DEPARTMENT_OTHER): Payer: Medicare Other

## 2013-01-12 ENCOUNTER — Other Ambulatory Visit (HOSPITAL_BASED_OUTPATIENT_CLINIC_OR_DEPARTMENT_OTHER): Payer: Medicare Other | Admitting: Lab

## 2013-01-12 VITALS — BP 147/56 | HR 66 | Temp 98.0°F

## 2013-01-12 DIAGNOSIS — N039 Chronic nephritic syndrome with unspecified morphologic changes: Secondary | ICD-10-CM

## 2013-01-12 DIAGNOSIS — N189 Chronic kidney disease, unspecified: Secondary | ICD-10-CM

## 2013-01-12 DIAGNOSIS — D631 Anemia in chronic kidney disease: Secondary | ICD-10-CM

## 2013-01-12 DIAGNOSIS — D473 Essential (hemorrhagic) thrombocythemia: Secondary | ICD-10-CM

## 2013-01-12 LAB — CBC WITH DIFFERENTIAL/PLATELET
Basophils Absolute: 0.1 10*3/uL (ref 0.0–0.1)
Eosinophils Absolute: 0.2 10*3/uL (ref 0.0–0.5)
HGB: 10.9 g/dL — ABNORMAL LOW (ref 13.0–17.1)
MONO#: 0.4 10*3/uL (ref 0.1–0.9)
NEUT#: 3.3 10*3/uL (ref 1.5–6.5)
RDW: 15.6 % — ABNORMAL HIGH (ref 11.0–14.6)
WBC: 4.6 10*3/uL (ref 4.0–10.3)
lymph#: 0.7 10*3/uL — ABNORMAL LOW (ref 0.9–3.3)

## 2013-01-12 MED ORDER — DARBEPOETIN ALFA-POLYSORBATE 300 MCG/0.6ML IJ SOLN
300.0000 ug | Freq: Once | INTRAMUSCULAR | Status: AC
  Administered 2013-01-12: 300 ug via SUBCUTANEOUS
  Filled 2013-01-12: qty 0.6

## 2013-01-14 ENCOUNTER — Ambulatory Visit
Admission: RE | Admit: 2013-01-14 | Discharge: 2013-01-14 | Disposition: A | Discharge status: Home / Self Care | Payer: Medicare Other | Source: Ambulatory Visit | Attending: Nephrology | Admitting: Nephrology

## 2013-01-14 ENCOUNTER — Other Ambulatory Visit: Payer: Self-pay | Admitting: Nephrology

## 2013-01-14 DIAGNOSIS — R05 Cough: Secondary | ICD-10-CM

## 2013-01-21 ENCOUNTER — Ambulatory Visit (INDEPENDENT_AMBULATORY_CARE_PROVIDER_SITE_OTHER): Payer: Medicare Other | Admitting: Cardiology

## 2013-01-21 ENCOUNTER — Encounter: Payer: Self-pay | Admitting: Cardiology

## 2013-01-21 VITALS — BP 150/65 | HR 55 | Ht 70.0 in | Wt 154.0 lb

## 2013-01-21 DIAGNOSIS — I255 Ischemic cardiomyopathy: Secondary | ICD-10-CM

## 2013-01-21 DIAGNOSIS — I2589 Other forms of chronic ischemic heart disease: Secondary | ICD-10-CM

## 2013-01-21 DIAGNOSIS — I2510 Atherosclerotic heart disease of native coronary artery without angina pectoris: Secondary | ICD-10-CM

## 2013-01-21 DIAGNOSIS — N189 Chronic kidney disease, unspecified: Secondary | ICD-10-CM

## 2013-01-21 LAB — BASIC METABOLIC PANEL
CO2: 23 mEq/L (ref 19–32)
Calcium: 9.2 mg/dL (ref 8.4–10.5)
GFR: 26 mL/min — ABNORMAL LOW (ref >60.00)
Potassium: 4.9 mEq/L (ref 3.5–5.1)
Sodium: 135 mEq/L (ref 135–145)

## 2013-01-21 LAB — LIPID PANEL
HDL: 30.2 mg/dL — ABNORMAL LOW (ref >39.00)
Total CHOL/HDL Ratio: 4
VLDL: 22.6 mg/dL (ref 0.0–40.0)

## 2013-01-21 NOTE — Patient Instructions (Addendum)
Your physician recommends that you have lab work today--BMET/Lipid profile.  Your physician wants you to follow-up in: 6 months with Dr Shirlee Latch. (February 2015).You will receive a reminder letter in the mail two months in advance. If you don't receive a letter, please call our office to schedule the follow-up appointment.

## 2013-01-22 DIAGNOSIS — I255 Ischemic cardiomyopathy: Secondary | ICD-10-CM | POA: Insufficient documentation

## 2013-01-22 NOTE — Progress Notes (Signed)
Patient ID: [name], male   DOB: 1931-07-12, 79 y.o.   MRN: [id number] PCP: Dr. Link Snuffer  79 yo with history of CAD s/p CABG, CKD, and essential thrombocythemia presents for cardiology followup.  He had CABG in 2006 and has had no catheterization since that time.  Soon after CABG, he had an episode of atrial flutter and was cardioverted to NSR.  He is no longer on coumadin.  He has had no documented recurrence of atrial arrhythmias.  Echo in 2/13 showed EF 45-50% with mildly dilated RV and PA systolic pressure 51 mmHg.  He denies exertional dyspnea.  He walks for exercise for 15-30 minutes at a time, rides a stationary bike, and has no problems going up a hill near his house.  No orthopnea or PND.  No chest pain.  He continues to followup twice a year with Dr. Arrie Aran for CKD.  He has had mild hyperkalemia.  BP is elevated this morning, but SBP at home has been running in the 120s-130s range.   Labs (11/12): K 4.9, creatinine 2.58 Labs (1/13): HCT 31.4, plts 168, WBC 4.3 Labs (5/14): K 5.4, creatinine 2.5  ECG: NSR, 1st degree AV block, LAFB.   PMH: 1. Essential thrombocythemia: Diagnosed in 1995, followed by Dr. Arline Asp. 2. CKD: ? Cause.  Has been stable recently.  Followed by Dr. Arrie Aran.  3. Anemia of CKD 4. GERD 5. TKR 12/09.  6. H/o TIAs 7. HTN: ? TIAs related to amlodipine use (cannot take amlodipine).  8. Atrial flutter: Occurred soon after CABG in 2006.  He was cardioverted to NSR and has had no recurrence.  He is not on coumadin.  9. CAD s/p CABG in 2006 with LIMA-LAD, seq SVG-OM1 and OM2, SVG-RCA, SVG-D.   Last EF evaluation was by TEE in 2006 with EF 55% and trivial AI.  10. Ischemic cardiomyopathy: Echo (2/13) with EF 45-50%, mild LVH, RV mildly dilated, PA systolic pressure 51 mmHg, moderate TR.   SH: Retired from [employer], quit smoking at age 25, Prior heavy ETOH but none now.  Married, has disabled son who lives with him.   FH: Father with ? MI at age 77.    Current Outpatient Prescriptions  Medication Sig Dispense Refill  . acyclovir (ZOVIRAX) 200 MG capsule Take 1 tablet by mouth Daily.      Marland Kitchen anagrelide (AGRYLIN) 1 MG capsule Take 1 capsule daily or as directed  60 capsule  2  . aspirin 81 MG tablet Take 81 mg by mouth daily.        Marland Kitchen atorvastatin (LIPITOR) 20 MG tablet TAKE 1 TABLET BY MOUTH ONCE DAILY  30 tablet  0  . cloNIDine (CATAPRES) 0.3 MG tablet Take 0.3 mg by mouth 3 (three) times daily.        . fluticasone (FLONASE) 50 MCG/ACT nasal spray As directed      . furosemide (LASIX) 40 MG tablet Take 60 mg by mouth daily.       Marland Kitchen ketoconazole (NIZORAL) 2 % cream       . Linaclotide (LINZESS) 290 MCG CAPS Take by mouth. Take 1 tab every other night      . losartan (COZAAR) 25 MG tablet Take 25 mg by mouth daily.       . metoprolol succinate (TOPROL-XL) 100 MG 24 hr tablet take 1 tablet by mouth twice a day WITH OR IMMEDIATELY AFTER A MEAL  60 tablet  PRN  . pantoprazole (PROTONIX) 40 MG tablet take  1 tablet by mouth once daily  30 tablet  2  . psyllium (METAMUCIL) 58.6 % powder Take 1 packet by mouth as needed.        . Tamsulosin HCl (FLOMAX) 0.4 MG CAPS Take 0.4 mg by mouth daily after supper.       . triamcinolone cream (KENALOG) 0.1 % as directed.      . zolpidem (AMBIEN) 5 MG tablet Take 1 tablet by mouth as needed.       No current facility-administered medications for this visit.    BP 150/65  Pulse 55  Ht 5\' 10"  (1.778 m)  Wt 69.854 kg (154 lb)  BMI 22.1 kg/m2 General: NAD Neck: No JVD, no thyromegaly or thyroid nodule.  Lungs: Clear to auscultation bilaterally with normal respiratory effort. CV: Nondisplaced PMI.  Heart regular S1/S2 with wide S2 split, no S3/S4, no murmur.  No peripheral edema.  No carotid bruit.  Normal pedal pulses.  Abdomen: Soft, nontender, no hepatosplenomegaly, no distention.  Neurologic: Alert and oriented x 3.  Psych: Normal affect. Extremities: No clubbing or cyanosis.    Assessment/Plan: 1. CAD: s/p CABG.  No ischemic symptoms.  Continue ASA 81, statin, Toprol XL, ARB.  2. Ischemic cardiomyopathy: Mild.  EF 45-50% on last echo.  He is on Toprol XL and losartan.  Would not titrate up losartan given CKD and tendency towards hyperkalemia.  He is not volume overloaded on exam.  3. CKD: Creatinine 2.5 in 5/14 (stable) but K was elevated at 5.4.  Will repeat BMET today, may need to back off on losartan if K remains elevated.  4. Hyperlipidemia: Check lipids today on atorvastatin.   Marca Ancona 01/22/2013

## 2013-01-29 ENCOUNTER — Encounter: Payer: Self-pay | Admitting: *Deleted

## 2013-01-29 ENCOUNTER — Encounter: Payer: Self-pay | Admitting: Cardiology

## 2013-02-03 ENCOUNTER — Other Ambulatory Visit (HOSPITAL_BASED_OUTPATIENT_CLINIC_OR_DEPARTMENT_OTHER): Payer: Medicare Other

## 2013-02-03 ENCOUNTER — Ambulatory Visit: Payer: Medicare Other

## 2013-02-03 DIAGNOSIS — N189 Chronic kidney disease, unspecified: Secondary | ICD-10-CM

## 2013-02-03 DIAGNOSIS — D631 Anemia in chronic kidney disease: Secondary | ICD-10-CM

## 2013-02-03 LAB — CBC WITH DIFFERENTIAL/PLATELET
Basophils Absolute: 0 10*3/uL (ref 0.0–0.1)
Eosinophils Absolute: 0.2 10*3/uL (ref 0.0–0.5)
HGB: 11 g/dL — ABNORMAL LOW (ref 13.0–17.1)
NEUT#: 3.3 10*3/uL (ref 1.5–6.5)
RDW: 15 % — ABNORMAL HIGH (ref 11.0–14.6)
lymph#: 0.8 10*3/uL — ABNORMAL LOW (ref 0.9–3.3)

## 2013-02-03 MED ORDER — DARBEPOETIN ALFA-POLYSORBATE 300 MCG/0.6ML IJ SOLN: 300.0000 ug | Freq: Once | INTRAMUSCULAR | Status: DC

## 2013-02-04 ENCOUNTER — Encounter: Payer: Self-pay | Admitting: Nephrology

## 2013-02-22 ENCOUNTER — Other Ambulatory Visit: Payer: Self-pay

## 2013-02-22 ENCOUNTER — Other Ambulatory Visit: Payer: Self-pay | Admitting: Cardiology

## 2013-02-22 DIAGNOSIS — D631 Anemia in chronic kidney disease: Secondary | ICD-10-CM

## 2013-02-23 ENCOUNTER — Telehealth: Payer: Self-pay | Admitting: Internal Medicine

## 2013-02-23 ENCOUNTER — Ambulatory Visit (HOSPITAL_BASED_OUTPATIENT_CLINIC_OR_DEPARTMENT_OTHER): Payer: Medicare Other

## 2013-02-23 ENCOUNTER — Other Ambulatory Visit (HOSPITAL_BASED_OUTPATIENT_CLINIC_OR_DEPARTMENT_OTHER): Payer: Medicare Other | Admitting: Lab

## 2013-02-23 ENCOUNTER — Encounter: Payer: Self-pay | Admitting: Internal Medicine

## 2013-02-23 ENCOUNTER — Ambulatory Visit (HOSPITAL_BASED_OUTPATIENT_CLINIC_OR_DEPARTMENT_OTHER): Payer: Medicare Other | Admitting: Internal Medicine

## 2013-02-23 VITALS — Ht 70.0 in | Wt 155.8 lb

## 2013-02-23 VITALS — BP 129/49 | HR 60 | Temp 98.1°F

## 2013-02-23 DIAGNOSIS — D473 Essential (hemorrhagic) thrombocythemia: Secondary | ICD-10-CM

## 2013-02-23 DIAGNOSIS — D631 Anemia in chronic kidney disease: Secondary | ICD-10-CM

## 2013-02-23 DIAGNOSIS — N189 Chronic kidney disease, unspecified: Secondary | ICD-10-CM

## 2013-02-23 DIAGNOSIS — R011 Cardiac murmur, unspecified: Secondary | ICD-10-CM

## 2013-02-23 LAB — IRON AND TIBC CHCC
Iron: 74 ug/dL (ref 42–163)
TIBC: 226 ug/dL (ref 202–409)

## 2013-02-23 LAB — CBC WITH DIFFERENTIAL/PLATELET
Basophils Absolute: 0.1 10*3/uL (ref 0.0–0.1)
EOS%: 4.1 % (ref 0.0–7.0)
HCT: 29.2 % — ABNORMAL LOW (ref 38.4–49.9)
HGB: 9.7 g/dL — ABNORMAL LOW (ref 13.0–17.1)
MCH: 30.4 pg (ref 27.2–33.4)
MONO#: 0.5 10*3/uL (ref 0.1–0.9)
NEUT%: 73.9 % (ref 39.0–75.0)
Platelets: 374 10*3/uL (ref 140–400)
lymph#: 0.9 10*3/uL (ref 0.9–3.3)

## 2013-02-23 LAB — COMPREHENSIVE METABOLIC PANEL (CC13)
BUN: 93 mg/dL — ABNORMAL HIGH (ref 7.0–26.0)
CO2: 20 mEq/L — ABNORMAL LOW (ref 22–29)
Calcium: 9.1 mg/dL (ref 8.4–10.4)
Chloride: 107 mEq/L (ref 98–109)
Creatinine: 2.3 mg/dL — ABNORMAL HIGH (ref 0.7–1.3)

## 2013-02-23 LAB — FERRITIN CHCC: Ferritin: 241 ng/ml (ref 22–316)

## 2013-02-23 MED ORDER — DARBEPOETIN ALFA-POLYSORBATE 300 MCG/0.6ML IJ SOLN
300.0000 ug | Freq: Once | INTRAMUSCULAR | Status: AC
  Administered 2013-02-23: 300 ug via SUBCUTANEOUS
  Filled 2013-02-23: qty 0.6

## 2013-02-23 NOTE — Progress Notes (Signed)
[facility] OFFICE PROGRESS NOTE  No PCP Per Patient [address] [place] [address]  DIAGNOSIS: Anemia due to chronic renal failure treated with erythropoietin  Essential thrombocythemia  No chief complaint on file.   CURRENT THERAPY: 1. Aspirin 81 mg by mouth daily.  2. Anagrelide (Agrylin) 1 mg by mouth daily. 3. Aranesp 300 mcg subcu every 3 weeks for hemoglobin less than 11.  INTERVAL HISTORY: [name] 79 y.o. male history of essential thrombocythemia and anemia due to chronic renal failure treated with erythropoietin is her for 4 month follow-up.  He reports doing very well.  He denies recent hospitalizations or recurrent infections or bleeding.  He denies hematochezia or melena.     MEDICAL HISTORY: Past Medical History  Diagnosis Date  . Chronic kidney disease   . Thrombocythemia   . Esophageal stricture   . Anemia   . Anemia due to chronic renal failure treated with erythropoietin 03/24/2011  . Retinal vein thrombosis, left   . Coronary artery disease   . Hypertension     INTERIM HISTORY: has CAD; STRICTURE AND STENOSIS OF ESOPHAGUS; ESOPHAGEAL REFLUX; Essential thrombocythemia; Anemia due to chronic renal failure treated with erythropoietin; Retinal vein thrombosis, left; Murmur; CKD (chronic kidney disease); and Ischemic cardiomyopathy on his problem list.    ALLERGIES:  is allergic to norvasc and feraheme.  MEDICATIONS: has a current medication list which includes the following prescription(s): acyclovir, anagrelide, aspirin, atorvastatin, clonidine, fluticasone, furosemide, ketoconazole, linaclotide, losartan, metoprolol succinate, pantoprazole, psyllium, tamsulosin, triamcinolone cream, and zolpidem.  SURGICAL HISTORY:  Past Surgical History  Procedure Laterality Date  . Coronary artery bypass graft  09/13/04  . Replacement total knee  04/19/08    right  . Inguinal hernia repair  05/08/01    right    REVIEW OF SYSTEMS:    Constitutional: Denies fevers, chills or abnormal weight loss Eyes: Denies blurriness of vision Ears, nose, mouth, throat, and face: Denies mucositis or sore throat Respiratory: Denies cough, dyspnea or wheezes Cardiovascular: Denies palpitation, chest discomfort or lower extremity swelling Gastrointestinal:  Denies nausea, heartburn or change in bowel habits Skin: Denies abnormal skin rashes Lymphatics: Denies new lymphadenopathy or easy bruising Neurological:Denies numbness, tingling or new weaknesses Behavioral/Psych: Mood is stable, no new changes  All other systems were reviewed with the patient and are negative.  PHYSICAL EXAMINATION: ECOG PERFORMANCE STATUS: 0 - Asymptomatic  Height 5\' 10"  (1.778 m), weight 155 lb 12.8 oz (70.67 kg).  GENERAL:alert, no distress and comfortable, elderly male  SKIN: skin color, texture, turgor are normal, no rashes or significant lesions EYES: normal, Conjunctiva are pink and non-injected, sclera clear OROPHARYNX:no exudate, no erythema and lips, buccal mucosa, and tongue normal  NECK: supple, thyroid normal size, non-tender, without nodularity LYMPH:  no palpable lymphadenopathy in the cervical, axillary or inguinal LUNGS: clear to auscultation and percussion with normal breathing effort HEART: regular rate & rhythm and no murmurs and no lower extremity edema ABDOMEN:abdomen soft, non-tender and normal bowel sounds Musculoskeletal:no peripheral edema NEURO: alert & oriented x 3 with fluent speech, no focal motor/sensory deficits   LABORATORY DATA: Results for orders placed in visit on 02/23/13 (from the past 48 hour(s))  CBC WITH DIFFERENTIAL     Status: Abnormal   Collection Time    02/23/13 10:12 AM      Result Value Range   WBC 6.2  4.0 - 10.3 10e3/uL   NEUT# 4.6  1.5 - 6.5 10e3/uL   HGB 9.7 (*) 13.0 -  17.1 g/dL   HCT 16.1 (*) 09.6 - 04.5 %   Platelets 374  140 - 400 10e3/uL   MCV 91.3  79.3 - 98.0 fL   MCH 30.4  27.2 - 33.4 pg    MCHC 33.3  32.0 - 36.0 g/dL   RBC 4.09 (*) 8.11 - 9.14 10e6/uL   RDW 14.6  11.0 - 14.6 %   lymph# 0.9  0.9 - 3.3 10e3/uL   MONO# 0.5  0.1 - 0.9 10e3/uL   Eosinophils Absolute 0.3  0.0 - 0.5 10e3/uL   Basophils Absolute 0.1  0.0 - 0.1 10e3/uL   NEUT% 73.9  39.0 - 75.0 %   LYMPH% 13.6 (*) 14.0 - 49.0 %   MONO% 7.4  0.0 - 14.0 %   EOS% 4.1  0.0 - 7.0 %   BASO% 1.0  0.0 - 2.0 %  COMPREHENSIVE METABOLIC PANEL ([facility]13)     Status: Abnormal   Collection Time    02/23/13 10:12 AM      Result Value Range   Sodium 137  136 - 145 mEq/L   Potassium 5.2 (*) 3.5 - 5.1 mEq/L   Chloride 107  98 - 109 mEq/L   CO2 20 (*) 22 - 29 mEq/L   Glucose 107  70 - 140 mg/dl   BUN 78.2 (*) 7.0 - 95.6 mg/dL   Creatinine 2.3 (*) 0.7 - 1.3 mg/dL   Total Bilirubin 2.13  0.20 - 1.20 mg/dL   Alkaline Phosphatase 85  40 - 150 U/L   AST 14  5 - 34 U/L   ALT 11  0 - 55 U/L   Total Protein 6.5  6.4 - 8.3 g/dL   Albumin 3.3 (*) 3.5 - 5.0 g/dL   Calcium 9.1  8.4 - 08.6 mg/dL  IRON AND TIBC [facility]     Status: None   Collection Time    02/23/13 10:12 AM      Result Value Range   Iron 74  42 - 163 ug/dL   TIBC 578  469 - 629 ug/dL   UIBC 528  413 - 244 ug/dL   %SAT 33  20 - 55 %  FERRITIN [facility]     Status: None   Collection Time    02/23/13 10:12 AM      Result Value Range   Ferritin 241  22 - 316 ng/ml  LACTATE DEHYDROGENASE (CC13)     Status: None   Collection Time    02/23/13 10:12 AM      Result Value Range   LDH 151  125 - 245 U/L    RADIOGRAPHIC STUDIES: No results found.  ASSESSMENT: Essential thrombocythemia with diagnosis going back to April 1995. Anemia secondary to chronic renal disease.  PLAN:  --Mr. Pensinger continues to do fairly well on the current treatment program.  --Based on hemoglobin of 9.7. He will receive Aranesp today 300 mcg subcu. We are continuing to check CBCs every 3 weeks and we will give him Aranesp 300 mcg subcu whenever the hemoglobin is less than 11.  --Platelet counts have  been quite satisfactory usually in the 300-400,000 range.  --We will plan to see him again in 4 months, at  which time we will check CBC, chemistries and iron studies.  All questions were answered. The patient knows to call the clinic with any problems, questions or concerns. We can certainly see the patient much sooner if necessary.  I spent 15 minutes counseling the patient face to face. The  total time spent in the appointment was 30 minutes.    Allannah Kempen, MD 02/23/2013 11:29 AM

## 2013-02-23 NOTE — Patient Instructions (Signed)
Chronic Kidney Disease Chronic kidney disease occurs when the kidneys are damaged over a long period. The kidneys are two organs that lie on either side of the spine between the middle of the back and the front of the abdomen. The kidneys:   Remove wastes and extra water from the blood.   Produce important hormones. These help keep bones strong, regulate blood pressure, and help create red blood cells.   Balance the fluids and chemicals in the blood and tissues. A small amount of kidney damage may not cause problems, but a large amount of damage may make it difficult or impossible for the kidneys to work the way they should. If steps are not taken to slow down the kidney damage or stop it from getting worse, the kidneys may stop working permanently. Most of the time, chronic kidney disease does not go away. However, it can often be controlled, and those with the disease can usually live normal lives. CAUSES  The most common causes of chronic kidney disease are diabetes and high blood pressure (hypertension). Chronic kidney disease may also be caused by:   Diseases that cause kidneys' filters to become inflamed.   Diseases that affect the immune system.   Genetic diseases.   Medicines that damage the kidneys, such as anti-inflammatory medicines.  Poisoning or exposure to toxic substances.   A reoccurring kidney or urinary infection.   A problem with urine flow. This may be caused by:   Cancer.   Kidney stones.   An enlarged prostate in males. SYMPTOMS  Because the kidney damage in chronic kidney disease occurs slowly, symptoms develop slowly and may not be obvious until the kidney damage becomes severe. A person may have a kidney disease for years without showing any symptoms. Symptoms can include:   Swelling (edema) of the legs, ankles, or feet.   Tiredness (lethargy).   Nausea or vomiting.   Confusion.   Problems with urination, such as:   Decreased urine  production.   Frequent urination, especially at night.   Frequent accidents in children who are potty trained.   Muscle twitches and cramps.   Shortness of breath.  Weakness.   Persistent itchiness.   Loss of appetite.  Metallic taste in the mouth.  Trouble sleeping.  Slowed development in children.  Short stature in children. DIAGNOSIS  Chronic kidney disease may be detected and diagnosed by tests, including blood, urine, imaging, or kidney biopsy tests.  TREATMENT  Most chronic kidney diseases cannot be cured. Treatment usually involves relieving symptoms and preventing or slowing the progression of the disease. Treatment may include:   A special diet. You may need to avoid alcohol and foods thatare salty and high in potassium.   Medicines. These may:   Lower blood pressure.   Relieve anemia.   Relieve swelling.   Protect the bones. HOME CARE INSTRUCTIONS   Follow your prescribed diet.   Only take over-the-counter or prescription medicines as directed by your caregiver.  Do not take any new medicines (prescription, over-the-counter, or nutritional supplements) unless approved by your caregiver. Many medicines can worsen your kidney damage or need to have the dose adjusted.   Quit smoking if you are a smoker. Talk to your caregiver about a smoking cessation program.   Keep all follow-up appointments as directed by your caregiver. SEEK IMMEDIATE MEDICAL CARE IF:  Your symptoms get worse or you develop new symptoms.   You develop symptoms of end-stage kidney disease. These include:   Headaches.  Abnormally dark or light skin.   Numbness in the hands or feet.   Easy bruising.   Frequent hiccups.   Menstruation stops.   You have a fever.   You have decreased urine production.   You havepain or bleeding when urinating. MAKE SURE YOU:  Understand these instructions.  Will watch your condition.  Will get help right  away if you are not doing well or get worse. FOR MORE INFORMATION  American Association of Kidney Patients: ResidentialShow.is National Kidney Foundation: www.kidney.org American Kidney Fund: FightingMatch.com.ee [facility]: www.lifeoptions.org and www.kidneyschool.org Document Released: 02/28/2008 Document Revised: 05/07/2012 Document Reviewed: 01/18/2012 [facility] Patient Information 2014 [place], [place]. Anemia, Frequently Asked Questions WHAT ARE THE SYMPTOMS OF ANEMIA?  Headache.  Difficulty thinking.  Fatigue.  Shortness of breath.  Weakness.  Rapid heartbeat. AT WHAT POINT ARE PEOPLE CONSIDERED ANEMIC?  This varies with gender and age.   Both hemoglobin (Hgb) and hematocrit values are used to define anemia. These lab values are obtained from a complete blood count (CBC) test. This is performed at a caregiver's office.  The normal range of hemoglobin values for adult men is 14.0 g/dL to 16.1 g/dL. For nonpregnant women, values are 12.3 g/dL to 09.6 g/dL.  The World Health Organization defines anemia as less than 12 g/dL for nonpregnant women and less than 13 g/dL for men.  For adult males, the average normal hematocrit is 46%, and the range is 40% to 52%.  For adult females, the average normal hematocrit is 41%, and the range is 35% to 47%.  Values that fall below the lower limits can be a sign of anemia and should have further checking (evaluation). GROUPS OF PEOPLE WHO ARE AT RISK FOR DEVELOPING ANEMIA INCLUDE:   Infants who are breastfed or taking a formula that is not fortified with iron.  Children going through a rapid growth spurt. The iron available can not keep up with the needs for a red cell mass which must grow with the child.  Women in childbearing years. They need iron because of blood loss during menstruation.  Pregnant women. The growing fetus creates a high demand for iron.  People with ongoing gastrointestinal blood loss are at risk of  developing iron deficiency.  Individuals with leukemia or cancer who must receive chemotherapy or radiation to treat their disease. The drugs or radiation used to treat these diseases often decreases the bone marrow's ability to make cells of all classes. This includes red blood cells, white blood cells, and platelets.  Individuals with chronic inflammatory conditions such as rheumatoid arthritis or chronic infections.  The elderly. ARE SOME TYPES OF ANEMIA INHERITED?   Yes, some types of anemia are due to inherited or genetic defects.  Sickle cell anemia. This occurs most often in people of African, African American, and Mediterranean descent.  Thalassemia (or Cooley's anemia). This type is found in people of Mediterranean and Southeast Asian descent. These types of anemia are common.  [place]. This is rare. CAN CERTAIN MEDICATIONS CAUSE A PERSON TO BECOME ANEMIC?  Yes. For example, drugs to fight cancer (chemotherapeutic agents) often cause anemia. These drugs can slow the bone marrow's ability to make red blood cells. If there are not enough red blood cells, the body does not get enough oxygen. WHAT HEMATOCRIT LEVEL IS REQUIRED TO DONATE BLOOD?  The lower limit of an acceptable hematocrit for blood donors is 38%. If you have a low hematocrit value, you should schedule an appointment with your caregiver. ARE  BLOOD TRANSFUSIONS COMMONLY USED TO CORRECT ANEMIA, AND ARE THEY DANGEROUS?  They are used to treat anemia as a last resort. Your caregiver will find the cause of the anemia and correct it if possible. Most blood transfusions are given because of excessive bleeding at the time of surgery, with trauma, or because of bone marrow suppression in patients with cancer or leukemia on chemotherapy. Blood transfusions are safer than ever before. We also know that blood transfusions affect the immune system and may increase certain risks. There is also a concern for human error. In 1/16,000  transfusions, a patient receives a transfusion of blood that is not matched with his or her blood type.  WHAT IS IRON DEFICIENCY ANEMIA AND CAN I CORRECT IT BY CHANGING MY DIET?  Iron is an essential part of hemoglobin. Without enough hemoglobin, anemia develops and the body does not get the right amount of oxygen. Iron deficiency anemia develops after the body has had a low level of iron for a long time. This is either caused by blood loss, not taking in or absorbing enough iron, or increased demands for iron (like pregnancy or rapid growth).  Foods from animal origin such as beef, chicken, and pork, are good sources of iron. Be sure to have one of these foods at each meal. Vitamin C helps your body absorb iron. Foods rich in Vitamin C include citrus, bell pepper, strawberries, spinach and cantaloupe. In some cases, iron supplements may be needed in order to correct the iron deficiency. In the case of poor absorption, extra iron may have to be given directly into the vein through a needle (intravenously). I HAVE BEEN DIAGNOSED WITH IRON DEFICIENCY ANEMIA AND MY CAREGIVER PRESCRIBED IRON SUPPLEMENTS. HOW LONG WILL IT TAKE FOR MY BLOOD TO BECOME NORMAL?  It depends on the degree of anemia at the beginning of treatment. Most people with mild to moderate iron deficiency, anemia will correct the anemia over a period of 2 to 3 months. But after the anemia is corrected, the iron stored by the body is still low. Caregivers often suggest an additional 6 months of oral iron therapy once the anemia has been reversed. This will help prevent the iron deficiency anemia from quickly happening again. Non-anemic adult males should take iron supplements only under the direction of a doctor, too much iron can cause liver damage.  MY HEMOGLOBIN IS 9 G/DL AND I AM SCHEDULED FOR SURGERY. SHOULD I POSTPONE THE SURGERY?  If you have Hgb of 9, you should discuss this with your caregiver right away. Many patients with similar  hemoglobin levels have had surgery without problems. If minimal blood loss is expected for a minor procedure, no treatment may be necessary.  If a greater blood loss is expected for more extensive procedures, you should ask your caregiver about being treated with erythropoietin and iron. This is to accelerate the recovery of your hemoglobin to a normal level before surgery. An anemic patient who undergoes high-blood-loss surgery has a greater risk of surgical complications and need for a blood transfusion, which also carries some risk.  I HAVE BEEN TOLD THAT HEAVY MENSTRUAL PERIODS CAUSE ANEMIA. IS THERE ANYTHING I CAN DO TO PREVENT THE ANEMIA?  Anemia that results from heavy periods is usually due to iron deficiency. You can try to meet the increased demands for iron caused by the heavy monthly blood loss by increasing the intake of iron-rich foods. Iron supplements may be required. Discuss your concerns with your caregiver. WHAT CAUSES  ANEMIA DURING PREGNANCY?  Pregnancy places major demands on the body. The mother must meet the needs of both her body and her growing baby. The body needs enough iron and folate to make the right amount of red blood cells. To prevent anemia while pregnant, the mother should stay in close contact with her caregiver.  Be sure to eat a diet that has foods rich in iron and folate like liver and dark green leafy vegetables. Folate plays an important role in the normal development of a baby's spinal cord. Folate can help prevent serious disorders like spina bifida. If your diet does not provide adequate nutrients, you may want to talk with your caregiver about nutritional supplements.  WHAT IS THE RELATIONSHIP BETWEEN FIBROID TUMORS AND ANEMIA IN WOMEN?  The relationship is usually caused by the increased menstrual blood loss caused by fibroids. Good iron intake may be required to prevent iron deficiency anemia from developing.  Document Released: 12/28/2003 Document Revised:  08/13/2011 Document Reviewed: 06/13/2010 [facility] Patient Information 2014 [place], [place]. Fall Prevention and Home Safety Falls cause injuries and can affect all age groups. It is possible to use preventive measures to significantly decrease the likelihood of falls. There are many simple measures which can make your home safer and prevent falls. OUTDOORS  Repair cracks and edges of walkways and driveways.  Remove high doorway thresholds.  Trim shrubbery on the main path into your home.  Have good outside lighting.  Clear walkways of tools, rocks, debris, and clutter.  Check that handrails are not broken and are securely fastened. Both sides of steps should have handrails.  Have leaves, snow, and ice cleared regularly.  Use sand or salt on walkways during winter months.  In the garage, clean up grease or oil spills. BATHROOM  Install night lights.  Install grab bars by the toilet and in the tub and shower.  Use non-skid mats or decals in the tub or shower.  Place a plastic non-slip stool in the shower to sit on, if needed.  Keep floors dry and clean up all water on the floor immediately.  Remove soap buildup in the tub or shower on a regular basis.  Secure bath mats with non-slip, double-sided rug tape.  Remove throw rugs and tripping hazards from the floors. BEDROOMS  Install night lights.  Make sure a bedside light is easy to reach.  Do not use oversized bedding.  Keep a telephone by your bedside.  Have a firm chair with side arms to use for getting dressed.  Remove throw rugs and tripping hazards from the floor. KITCHEN  Keep handles on pots and pans turned toward the center of the stove. Use back burners when possible.  Clean up spills quickly and allow time for drying.  Avoid walking on wet floors.  Avoid hot utensils and knives.  Position shelves so they are not too high or low.  Place commonly used objects within easy reach.  If necessary,  use a sturdy step stool with a grab bar when reaching.  Keep electrical cables out of the way.  Do not use floor polish or wax that makes floors slippery. If you must use wax, use non-skid floor wax.  Remove throw rugs and tripping hazards from the floor. STAIRWAYS  Never leave objects on stairs.  Place handrails on both sides of stairways and use them. Fix any loose handrails. Make sure handrails on both sides of the stairways are as long as the stairs.  Check carpeting to make  sure it is firmly attached along stairs. Make repairs to worn or loose carpet promptly.  Avoid placing throw rugs at the top or bottom of stairways, or properly secure the rug with carpet tape to prevent slippage. Get rid of throw rugs, if possible.  Have an electrician put in a light switch at the top and bottom of the stairs. OTHER FALL PREVENTION TIPS  Wear low-heel or rubber-soled shoes that are supportive and fit well. Wear closed toe shoes.  When using a stepladder, make sure it is fully opened and both spreaders are firmly locked. Do not climb a closed stepladder.  Add color or contrast paint or tape to grab bars and handrails in your home. Place contrasting color strips on first and last steps.  Learn and use mobility aids as needed. Install an electrical emergency response system.  Turn on lights to avoid dark areas. Replace light bulbs that burn out immediately. Get light switches that glow.  Arrange furniture to create clear pathways. Keep furniture in the same place.  Firmly attach carpet with non-skid or double-sided tape.  Eliminate uneven floor surfaces.  Select a carpet pattern that does not visually hide the edge of steps.  Be aware of all pets. OTHER HOME SAFETY TIPS  Set the water temperature for 120 F (48.8 C).  Keep emergency numbers on or near the telephone.  Keep smoke detectors on every level of the home and near sleeping areas. Document Released: 05/11/2002 Document  Revised: 11/20/2011 Document Reviewed: 08/10/2011 [facility] Patient Information 2014 [place], [place].

## 2013-02-23 NOTE — Telephone Encounter (Signed)
, °

## 2013-03-16 ENCOUNTER — Other Ambulatory Visit (HOSPITAL_BASED_OUTPATIENT_CLINIC_OR_DEPARTMENT_OTHER): Payer: Medicare Other | Admitting: Lab

## 2013-03-16 ENCOUNTER — Ambulatory Visit (HOSPITAL_BASED_OUTPATIENT_CLINIC_OR_DEPARTMENT_OTHER): Payer: Medicare Other

## 2013-03-16 ENCOUNTER — Other Ambulatory Visit: Payer: Self-pay | Admitting: Cardiology

## 2013-03-16 ENCOUNTER — Encounter (INDEPENDENT_AMBULATORY_CARE_PROVIDER_SITE_OTHER): Payer: Self-pay

## 2013-03-16 VITALS — BP 147/52 | HR 63 | Temp 97.6°F

## 2013-03-16 DIAGNOSIS — D631 Anemia in chronic kidney disease: Secondary | ICD-10-CM

## 2013-03-16 DIAGNOSIS — N189 Chronic kidney disease, unspecified: Secondary | ICD-10-CM

## 2013-03-16 DIAGNOSIS — D473 Essential (hemorrhagic) thrombocythemia: Secondary | ICD-10-CM

## 2013-03-16 LAB — CBC WITH DIFFERENTIAL/PLATELET
Basophils Absolute: 0.1 10*3/uL (ref 0.0–0.1)
EOS%: 5.7 % (ref 0.0–7.0)
Eosinophils Absolute: 0.3 10*3/uL (ref 0.0–0.5)
HCT: 28.3 % — ABNORMAL LOW (ref 38.4–49.9)
HGB: 9.4 g/dL — ABNORMAL LOW (ref 13.0–17.1)
LYMPH%: 16.4 % (ref 14.0–49.0)
MCH: 31.1 pg (ref 27.2–33.4)
MCV: 93.3 fL (ref 79.3–98.0)
MONO%: 9.3 % (ref 0.0–14.0)
NEUT#: 3.1 10*3/uL (ref 1.5–6.5)
NEUT%: 67.3 % (ref 39.0–75.0)
Platelets: 354 10*3/uL (ref 140–400)
RDW: 15.7 % — ABNORMAL HIGH (ref 11.0–14.6)
WBC: 4.7 10*3/uL (ref 4.0–10.3)

## 2013-03-16 MED ORDER — DARBEPOETIN ALFA-POLYSORBATE 300 MCG/0.6ML IJ SOLN
300.0000 ug | Freq: Once | INTRAMUSCULAR | Status: AC
  Administered 2013-03-16: 300 ug via SUBCUTANEOUS
  Filled 2013-03-16: qty 0.6

## 2013-04-06 ENCOUNTER — Other Ambulatory Visit (HOSPITAL_BASED_OUTPATIENT_CLINIC_OR_DEPARTMENT_OTHER): Payer: Medicare Other | Admitting: Lab

## 2013-04-06 ENCOUNTER — Ambulatory Visit (HOSPITAL_BASED_OUTPATIENT_CLINIC_OR_DEPARTMENT_OTHER): Payer: Medicare Other

## 2013-04-06 VITALS — BP 138/62 | HR 66 | Temp 97.8°F

## 2013-04-06 DIAGNOSIS — N189 Chronic kidney disease, unspecified: Secondary | ICD-10-CM

## 2013-04-06 DIAGNOSIS — D631 Anemia in chronic kidney disease: Secondary | ICD-10-CM

## 2013-04-06 DIAGNOSIS — D473 Essential (hemorrhagic) thrombocythemia: Secondary | ICD-10-CM

## 2013-04-06 LAB — CBC WITH DIFFERENTIAL/PLATELET
Eosinophils Absolute: 0.5 10*3/uL (ref 0.0–0.5)
HCT: 30.6 % — ABNORMAL LOW (ref 38.4–49.9)
LYMPH%: 9.6 % — ABNORMAL LOW (ref 14.0–49.0)
MONO#: 0.6 10*3/uL (ref 0.1–0.9)
NEUT#: 4.9 10*3/uL (ref 1.5–6.5)
NEUT%: 73.1 % (ref 39.0–75.0)
Platelets: 343 10*3/uL (ref 140–400)
RBC: 3.24 10*6/uL — ABNORMAL LOW (ref 4.20–5.82)
RDW: 15.1 % — ABNORMAL HIGH (ref 11.0–14.6)
WBC: 6.7 10*3/uL (ref 4.0–10.3)

## 2013-04-06 MED ORDER — DARBEPOETIN ALFA-POLYSORBATE 300 MCG/0.6ML IJ SOLN
300.0000 ug | Freq: Once | INTRAMUSCULAR | Status: AC
  Administered 2013-04-06: 300 ug via SUBCUTANEOUS
  Filled 2013-04-06: qty 0.6

## 2013-04-27 ENCOUNTER — Other Ambulatory Visit: Payer: Self-pay | Admitting: Medical Oncology

## 2013-04-27 ENCOUNTER — Ambulatory Visit: Payer: Medicare Other

## 2013-04-27 ENCOUNTER — Other Ambulatory Visit (HOSPITAL_BASED_OUTPATIENT_CLINIC_OR_DEPARTMENT_OTHER): Payer: Medicare Other | Admitting: Lab

## 2013-04-27 ENCOUNTER — Ambulatory Visit (HOSPITAL_BASED_OUTPATIENT_CLINIC_OR_DEPARTMENT_OTHER): Payer: Medicare Other

## 2013-04-27 VITALS — BP 151/63 | HR 58 | Temp 97.6°F

## 2013-04-27 DIAGNOSIS — N189 Chronic kidney disease, unspecified: Secondary | ICD-10-CM

## 2013-04-27 DIAGNOSIS — D473 Essential (hemorrhagic) thrombocythemia: Secondary | ICD-10-CM

## 2013-04-27 DIAGNOSIS — D631 Anemia in chronic kidney disease: Secondary | ICD-10-CM

## 2013-04-27 LAB — CBC WITH DIFFERENTIAL/PLATELET
BASO%: 1.6 % (ref 0.0–2.0)
Basophils Absolute: 0.1 10*3/uL (ref 0.0–0.1)
Eosinophils Absolute: 0.2 10*3/uL (ref 0.0–0.5)
HCT: 24.4 % — ABNORMAL LOW (ref 38.4–49.9)
HGB: 7.8 g/dL — ABNORMAL LOW (ref 13.0–17.1)
LYMPH%: 18.4 % (ref 14.0–49.0)
MCH: 29.3 pg (ref 27.2–33.4)
MCHC: 31.9 g/dL — ABNORMAL LOW (ref 32.0–36.0)
MONO#: 0.6 10*3/uL (ref 0.1–0.9)
MONO%: 13.7 % (ref 0.0–14.0)
NEUT%: 61.6 % (ref 39.0–75.0)
Platelets: 362 10*3/uL (ref 140–400)
WBC: 4.1 10*3/uL (ref 4.0–10.3)

## 2013-04-27 MED ORDER — DARBEPOETIN ALFA-POLYSORBATE 300 MCG/0.6ML IJ SOLN
300.0000 ug | Freq: Once | INTRAMUSCULAR | Status: AC
  Administered 2013-04-27: 300 ug via SUBCUTANEOUS
  Filled 2013-04-27: qty 0.6

## 2013-04-28 ENCOUNTER — Other Ambulatory Visit: Payer: Self-pay | Admitting: Internal Medicine

## 2013-04-28 ENCOUNTER — Telehealth: Payer: Self-pay

## 2013-04-28 ENCOUNTER — Ambulatory Visit (HOSPITAL_COMMUNITY)
Admission: RE | Admit: 2013-04-28 | Discharge: 2013-04-28 | Disposition: A | Discharge status: Home / Self Care | Payer: Medicare Other | Source: Ambulatory Visit | Attending: Internal Medicine | Admitting: Internal Medicine

## 2013-04-28 DIAGNOSIS — D649 Anemia, unspecified: Secondary | ICD-10-CM

## 2013-04-28 LAB — IRON AND TIBC CHCC
%SAT: 17 % — ABNORMAL LOW (ref 20–55)
Iron: 38 ug/dL — ABNORMAL LOW (ref 42–163)
TIBC: 221 ug/dL (ref 202–409)
UIBC: 184 ug/dL (ref 117–376)

## 2013-04-28 LAB — FERRITIN CHCC: Ferritin: 175 ng/ml (ref 22–316)

## 2013-04-28 NOTE — Telephone Encounter (Signed)
S/w pt that Dr Rosie Fate wants to give him 2 units of blood 12/16 at 100. Pt stated he will be here.

## 2013-04-29 ENCOUNTER — Ambulatory Visit (HOSPITAL_BASED_OUTPATIENT_CLINIC_OR_DEPARTMENT_OTHER): Payer: Medicare Other

## 2013-04-29 VITALS — BP 176/54 | HR 54 | Temp 97.4°F | Resp 18

## 2013-04-29 DIAGNOSIS — D631 Anemia in chronic kidney disease: Secondary | ICD-10-CM

## 2013-04-29 DIAGNOSIS — D649 Anemia, unspecified: Secondary | ICD-10-CM

## 2013-04-29 DIAGNOSIS — N189 Chronic kidney disease, unspecified: Secondary | ICD-10-CM

## 2013-04-29 MED ORDER — SODIUM CHLORIDE 0.9 % IV SOLN
250.0000 mL | Freq: Once | INTRAVENOUS | Status: AC
  Administered 2013-04-29: 250 mL via INTRAVENOUS

## 2013-04-29 MED ORDER — FUROSEMIDE 10 MG/ML IJ SOLN
20.0000 mg | Freq: Once | INTRAMUSCULAR | Status: AC
  Administered 2013-04-29: 20 mg via INTRAVENOUS

## 2013-04-29 MED ORDER — ACETAMINOPHEN 325 MG PO TABS
650.0000 mg | ORAL_TABLET | Freq: Once | ORAL | Status: AC
  Administered 2013-04-29: 650 mg via ORAL

## 2013-04-29 MED ORDER — ACETAMINOPHEN 325 MG PO TABS
ORAL_TABLET | ORAL | Status: AC
  Filled 2013-04-29: qty 2

## 2013-04-29 NOTE — Patient Instructions (Signed)
Blood Transfusion Information WHAT IS A BLOOD TRANSFUSION? A transfusion is the replacement of blood or some of its parts. Blood is made up of multiple cells which provide different functions.  Red blood cells carry oxygen and are used for blood loss replacement.  White blood cells fight against infection.  Platelets control bleeding.  Plasma helps clot blood.  Other blood products are available for specialized needs, such as hemophilia or other clotting disorders. BEFORE THE TRANSFUSION  Who gives blood for transfusions?   You may be able to donate blood to be used at a later date on yourself (autologous donation).  Relatives can be asked to donate blood. This is generally not any safer than if you have received blood from a stranger. The same precautions are taken to ensure safety when a relative's blood is donated.  Healthy volunteers who are fully evaluated to make sure their blood is safe. This is blood bank blood. Transfusion therapy is the safest it has ever been in the practice of medicine. Before blood is taken from a donor, a complete history is taken to make sure that person has no history of diseases nor engages in risky social behavior (examples are intravenous drug use or sexual activity with multiple partners). The donor's travel history is screened to minimize risk of transmitting infections, such as malaria. The donated blood is tested for signs of infectious diseases, such as HIV and hepatitis. The blood is then tested to be sure it is compatible with you in order to minimize the chance of a transfusion reaction. If you or a relative donates blood, this is often done in anticipation of surgery and is not appropriate for emergency situations. It takes many days to process the donated blood. RISKS AND COMPLICATIONS Although transfusion therapy is very safe and saves many lives, the main dangers of transfusion include:   Getting an infectious disease.  Developing a  transfusion reaction. This is an allergic reaction to something in the blood you were given. Every precaution is taken to prevent this. The decision to have a blood transfusion has been considered carefully by your caregiver before blood is given. Blood is not given unless the benefits outweigh the risks. AFTER THE TRANSFUSION  Right after receiving a blood transfusion, you will usually feel much better and more energetic. This is especially true if your red blood cells have gotten low (anemic). The transfusion raises the level of the red blood cells which carry oxygen, and this usually causes an energy increase.  The nurse administering the transfusion will monitor you carefully for complications. HOME CARE INSTRUCTIONS  No special instructions are needed after a transfusion. You may find your energy is better. Speak with your caregiver about any limitations on activity for underlying diseases you may have. SEEK MEDICAL CARE IF:   Your condition is not improving after your transfusion.  You develop redness or irritation at the intravenous (IV) site. SEEK IMMEDIATE MEDICAL CARE IF:  Any of the following symptoms occur over the next 12 hours:  Shaking chills.  You have a temperature by mouth above 102 F (38.9 C), not controlled by medicine.  Chest, back, or muscle pain.  People around you feel you are not acting correctly or are confused.  Shortness of breath or difficulty breathing.  Dizziness and fainting.  You get a rash or develop hives.  You have a decrease in urine output.  Your urine turns a dark color or changes to pink, red, or brown. Any of the following   symptoms occur over the next 10 days:  You have a temperature by mouth above 102 F (38.9 C), not controlled by medicine.  Shortness of breath.  Weakness after normal activity.  The white part of the eye turns yellow (jaundice).  You have a decrease in the amount of urine or are urinating less often.  Your  urine turns a dark color or changes to pink, red, or brown. Document Released: 05/18/2000 Document Revised: 08/13/2011 Document Reviewed: 01/05/2008 Exit[employer] Patient Information 2014 [employer].  

## 2013-04-30 LAB — TYPE AND SCREEN
ABO/RH(D): A POS
Antibody Screen: NEGATIVE
Unit division: 0

## 2013-05-05 ENCOUNTER — Other Ambulatory Visit: Payer: Self-pay | Admitting: Cardiology

## 2013-05-18 ENCOUNTER — Other Ambulatory Visit (HOSPITAL_BASED_OUTPATIENT_CLINIC_OR_DEPARTMENT_OTHER): Payer: Medicare Other

## 2013-05-18 ENCOUNTER — Ambulatory Visit (HOSPITAL_BASED_OUTPATIENT_CLINIC_OR_DEPARTMENT_OTHER): Payer: Medicare Other

## 2013-05-18 VITALS — BP 117/55 | HR 59 | Temp 98.2°F

## 2013-05-18 DIAGNOSIS — D631 Anemia in chronic kidney disease: Secondary | ICD-10-CM

## 2013-05-18 DIAGNOSIS — D473 Essential (hemorrhagic) thrombocythemia: Secondary | ICD-10-CM

## 2013-05-18 DIAGNOSIS — N189 Chronic kidney disease, unspecified: Secondary | ICD-10-CM

## 2013-05-18 LAB — CBC WITH DIFFERENTIAL/PLATELET
BASO%: 1.2 % (ref 0.0–2.0)
EOS%: 4.7 % (ref 0.0–7.0)
HCT: 29.2 % — ABNORMAL LOW (ref 38.4–49.9)
HGB: 9.8 g/dL — ABNORMAL LOW (ref 13.0–17.1)
MCH: 31.3 pg (ref 27.2–33.4)
MCHC: 33.4 g/dL (ref 32.0–36.0)
MONO#: 0.4 10*3/uL (ref 0.1–0.9)
MONO%: 9.1 % (ref 0.0–14.0)
NEUT%: 67 % (ref 39.0–75.0)
RDW: 16.4 % — ABNORMAL HIGH (ref 11.0–14.6)
WBC: 4.1 10*3/uL (ref 4.0–10.3)
lymph#: 0.7 10*3/uL — ABNORMAL LOW (ref 0.9–3.3)

## 2013-05-18 MED ORDER — DARBEPOETIN ALFA-POLYSORBATE 300 MCG/0.6ML IJ SOLN
300.0000 ug | Freq: Once | INTRAMUSCULAR | Status: AC
  Administered 2013-05-18: 300 ug via SUBCUTANEOUS
  Filled 2013-05-18: qty 0.6

## 2013-06-03 ENCOUNTER — Other Ambulatory Visit: Payer: Self-pay | Admitting: *Deleted

## 2013-06-03 DIAGNOSIS — D473 Essential (hemorrhagic) thrombocythemia: Secondary | ICD-10-CM

## 2013-06-03 MED ORDER — ANAGRELIDE HCL 1 MG PO CAPS: ORAL_CAPSULE | ORAL | Status: DC

## 2013-06-08 ENCOUNTER — Other Ambulatory Visit (HOSPITAL_BASED_OUTPATIENT_CLINIC_OR_DEPARTMENT_OTHER): Payer: Medicare Other

## 2013-06-08 ENCOUNTER — Ambulatory Visit (HOSPITAL_BASED_OUTPATIENT_CLINIC_OR_DEPARTMENT_OTHER): Payer: Medicare Other

## 2013-06-08 VITALS — BP 134/57 | HR 61 | Temp 98.1°F

## 2013-06-08 DIAGNOSIS — N189 Chronic kidney disease, unspecified: Secondary | ICD-10-CM

## 2013-06-08 DIAGNOSIS — D631 Anemia in chronic kidney disease: Secondary | ICD-10-CM

## 2013-06-08 DIAGNOSIS — D473 Essential (hemorrhagic) thrombocythemia: Secondary | ICD-10-CM

## 2013-06-08 DIAGNOSIS — N039 Chronic nephritic syndrome with unspecified morphologic changes: Secondary | ICD-10-CM

## 2013-06-08 LAB — CBC WITH DIFFERENTIAL/PLATELET
BASO%: 1.2 % (ref 0.0–2.0)
Basophils Absolute: 0.1 10*3/uL (ref 0.0–0.1)
EOS%: 4.5 % (ref 0.0–7.0)
Eosinophils Absolute: 0.2 10*3/uL (ref 0.0–0.5)
HCT: 31.9 % — ABNORMAL LOW (ref 38.4–49.9)
HGB: 10.5 g/dL — ABNORMAL LOW (ref 13.0–17.1)
LYMPH%: 16.5 % (ref 14.0–49.0)
MCH: 30.6 pg (ref 27.2–33.4)
MCHC: 32.8 g/dL (ref 32.0–36.0)
MCV: 93.1 fL (ref 79.3–98.0)
MONO#: 0.5 10*3/uL (ref 0.1–0.9)
MONO%: 9.3 % (ref 0.0–14.0)
NEUT%: 68.5 % (ref 39.0–75.0)
NEUTROS ABS: 3.5 10*3/uL (ref 1.5–6.5)
Platelets: 375 10*3/uL (ref 140–400)
RBC: 3.43 10*6/uL — ABNORMAL LOW (ref 4.20–5.82)
RDW: 15.6 % — AB (ref 11.0–14.6)
WBC: 5.1 10*3/uL (ref 4.0–10.3)
lymph#: 0.8 10*3/uL — ABNORMAL LOW (ref 0.9–3.3)

## 2013-06-08 MED ORDER — DARBEPOETIN ALFA-POLYSORBATE 300 MCG/0.6ML IJ SOLN
300.0000 ug | Freq: Once | INTRAMUSCULAR | Status: AC
  Administered 2013-06-08: 300 ug via SUBCUTANEOUS
  Filled 2013-06-08: qty 0.6

## 2013-06-09 ENCOUNTER — Encounter: Payer: Self-pay | Admitting: Cardiology

## 2013-06-09 ENCOUNTER — Ambulatory Visit (INDEPENDENT_AMBULATORY_CARE_PROVIDER_SITE_OTHER): Payer: Medicare Other | Admitting: Cardiology

## 2013-06-09 VITALS — BP 142/58 | HR 64 | Ht 70.0 in | Wt 153.0 lb

## 2013-06-09 DIAGNOSIS — I255 Ischemic cardiomyopathy: Secondary | ICD-10-CM

## 2013-06-09 DIAGNOSIS — I10 Essential (primary) hypertension: Secondary | ICD-10-CM | POA: Insufficient documentation

## 2013-06-09 DIAGNOSIS — I2510 Atherosclerotic heart disease of native coronary artery without angina pectoris: Secondary | ICD-10-CM

## 2013-06-09 DIAGNOSIS — N189 Chronic kidney disease, unspecified: Secondary | ICD-10-CM

## 2013-06-09 DIAGNOSIS — I2589 Other forms of chronic ischemic heart disease: Secondary | ICD-10-CM

## 2013-06-09 MED ORDER — HYDRALAZINE HCL 25 MG PO TABS: 25.0000 mg | ORAL_TABLET | Freq: Three times a day (TID) | ORAL | Status: DC

## 2013-06-09 NOTE — Patient Instructions (Signed)
Start hydralazine 25mg  three times a day.  Your physician wants you to follow-up in: 6 months with Dr Aundra Dubin. (July 2015). You will receive a reminder letter in the mail two months in advance. If you don't receive a letter, please call our office to schedule the follow-up appointment.

## 2013-06-09 NOTE — Progress Notes (Signed)
Patient ID: RUSHTON EARLY, male   DOB: [birth year], 80 y.o.   MRN: [id number] PCP: Dr. Ardeth Perfect  80 yo with history of CAD s/p CABG, CKD, and essential thrombocythemia presents for cardiology followup.  He had CABG in 2006 and has had no catheterization since that time.  Soon after CABG, he had an episode of atrial flutter and was cardioverted to NSR.  He is no longer on coumadin.  He has had no documented recurrence of atrial arrhythmias.  Echo in 2/13 showed EF 45-50% with mildly dilated RV and PA systolic pressure 51 mmHg.  He denies exertional dyspnea.  He walks for exercise for 1-1.5 miles three times a week, rides a stationary bike, and has no problems going up a hill near his house.  No orthopnea or PND.  He continues to followup twice a year with Dr. Marval Regal for CKD.  He has had mild hyperkalemia.  BP at home has been elevated with SBP 140s-150s.  Last night, he woke up with sharp left lower chest pain.  He took aspirin and it resolved after about 1 minute.  He has not had exertional chest pain.   Labs (11/12): K 4.9, creatinine 2.58 Labs (1/13): HCT 31.4, plts 168, WBC 4.3 Labs (5/14): K 5.4, creatinine 2.5 Labs (8/14): LDL 57, HDL 30 Labs (9/14): K 5.2, creatinine 2.3 Labs (1/150: HCT 31.9  ECG: NSR, 1st degree AV block, LAFB.   PMH: 1. Essential thrombocythemia: Diagnosed in 1995, followed by Dr. Ralene Ok. 2. CKD: ? Cause.  Has been stable recently.  Followed by Dr. Marval Regal.  3. Anemia of CKD: Getting Aranesp. 4. GERD 5. TKR 12/09.  6. H/o TIAs 7. HTN: ? TIAs related to amlodipine use (cannot take amlodipine).  8. Atrial flutter: Occurred soon after CABG in 2006.  He was cardioverted to NSR and has had no recurrence.  He is not on coumadin.  9. CAD s/p CABG in 2006 with LIMA-LAD, seq SVG-OM1 and OM2, SVG-RCA, SVG-D.   Last EF evaluation was by TEE in 2006 with EF 55% and trivial AI.  10. Ischemic cardiomyopathy: Echo (2/13) with EF 45-50%, mild LVH, RV mildly dilated, PA systolic  pressure 51 mmHg, moderate TR.   SH: Retired from [employer], quit smoking at age 50, Prior heavy ETOH but none now.  Married, has disabled son who lives with him.   FH: Father with ? MI at age 49.   ROS: All systems reviewed and negative except as per HPI.   Current Outpatient Prescriptions  Medication Sig Dispense Refill  . acyclovir (ZOVIRAX) 200 MG capsule Take 1 tablet by mouth Daily.      Marland Kitchen anagrelide (AGRYLIN) 1 MG capsule Take 1 capsule daily or as directed  60 capsule  1  . aspirin 81 MG tablet Take 81 mg by mouth daily.        Marland Kitchen atorvastatin (LIPITOR) 20 MG tablet TAKE 1 TABLET BY MOUTH ONCE DAILY  30 tablet  0  . cloNIDine (CATAPRES) 0.3 MG tablet Take 0.3 mg by mouth 3 (three) times daily.        . fluticasone (FLONASE) 50 MCG/ACT nasal spray As directed      . furosemide (LASIX) 40 MG tablet Take 60 mg by mouth daily.       Marland Kitchen ketoconazole (NIZORAL) 2 % cream       . losartan (COZAAR) 25 MG tablet Take 25 mg by mouth daily.       . metoprolol succinate (TOPROL-XL) 100  MG 24 hr tablet take 1 tablet by mouth twice a day WITH OR IMMEDIATELY AFTER A MEAL  60 tablet  PRN  . pantoprazole (PROTONIX) 40 MG tablet take 1 tablet by mouth once daily  30 tablet  2  . psyllium (METAMUCIL) 58.6 % powder Take 1 packet by mouth as needed.        . Tamsulosin HCl (FLOMAX) 0.4 MG CAPS Take 0.4 mg by mouth daily after supper.       . triamcinolone cream (KENALOG) 0.1 % as directed.      . zolpidem (AMBIEN) 5 MG tablet Take 1 tablet by mouth as needed.      . hydrALAZINE (APRESOLINE) 25 MG tablet Take 1 tablet (25 mg total) by mouth 3 (three) times daily.  90 tablet  6   No current facility-administered medications for this visit.    BP 142/58  Pulse 64  Ht 5\' 10"  (1.778 m)  Wt 69.4 kg (153 lb)  BMI 21.95 kg/m2 General: NAD Neck: No JVD, no thyromegaly or thyroid nodule.  Lungs: Clear to auscultation bilaterally with normal respiratory effort. CV: Nondisplaced PMI.  Heart  regular S1/S2 with paradoxical S2 split, no S3/S4, no murmur.  No peripheral edema.  No carotid bruit.  Normal pedal pulses.  Abdomen: Soft, nontender, no hepatosplenomegaly, no distention.  Neurologic: Alert and oriented x 3.  Psych: Normal affect. Extremities: No clubbing or cyanosis.   Assessment/Plan: 1. CAD: s/p CABG.  Very atypical chest pain last night, I think this was noncardiac.  Continue ASA 81, statin, Toprol XL, ARB.  2. Ischemic cardiomyopathy: Mild.  EF 45-50% on last echo.  He is on Toprol XL and losartan.  Would not titrate up losartan given CKD and tendency towards hyperkalemia.  He is not volume overloaded on exam.  3. CKD: Creatinine stable with mild hyperkalemia.  Will repeat BMET today, may need to back off on losartan if K gets any higher.   4. Hyperlipidemia: Good lipids in 8/14.  5. HTN: BP is running high.  He cannot take amlodipine.  He is on a high dose of clonidine.  Will not increase ARB with CKD and tendency towards high K.  I will add hydralazine 25 mg tid.    Loralie Champagne 06/09/2013

## 2013-06-17 ENCOUNTER — Other Ambulatory Visit: Payer: Self-pay | Admitting: Dermatology

## 2013-06-29 ENCOUNTER — Encounter: Payer: Self-pay | Admitting: Internal Medicine

## 2013-06-29 ENCOUNTER — Ambulatory Visit (HOSPITAL_BASED_OUTPATIENT_CLINIC_OR_DEPARTMENT_OTHER): Payer: Medicare Other

## 2013-06-29 ENCOUNTER — Ambulatory Visit (HOSPITAL_BASED_OUTPATIENT_CLINIC_OR_DEPARTMENT_OTHER): Payer: Medicare Other | Admitting: Internal Medicine

## 2013-06-29 ENCOUNTER — Telehealth: Payer: Self-pay | Admitting: Internal Medicine

## 2013-06-29 ENCOUNTER — Other Ambulatory Visit (HOSPITAL_BASED_OUTPATIENT_CLINIC_OR_DEPARTMENT_OTHER): Payer: Medicare Other

## 2013-06-29 VITALS — BP 152/62 | HR 66 | Temp 97.7°F | Resp 20 | Ht 70.0 in | Wt 157.9 lb

## 2013-06-29 DIAGNOSIS — D0460 Carcinoma in situ of skin of unspecified upper limb, including shoulder: Secondary | ICD-10-CM

## 2013-06-29 DIAGNOSIS — D631 Anemia in chronic kidney disease: Secondary | ICD-10-CM

## 2013-06-29 DIAGNOSIS — N189 Chronic kidney disease, unspecified: Secondary | ICD-10-CM

## 2013-06-29 DIAGNOSIS — H348122 Central retinal vein occlusion, left eye, stable: Secondary | ICD-10-CM

## 2013-06-29 DIAGNOSIS — D473 Essential (hemorrhagic) thrombocythemia: Secondary | ICD-10-CM

## 2013-06-29 DIAGNOSIS — N039 Chronic nephritic syndrome with unspecified morphologic changes: Secondary | ICD-10-CM

## 2013-06-29 DIAGNOSIS — H349 Unspecified retinal vascular occlusion: Secondary | ICD-10-CM

## 2013-06-29 LAB — CBC WITH DIFFERENTIAL/PLATELET
BASO%: 1.1 % (ref 0.0–2.0)
Basophils Absolute: 0.1 10*3/uL (ref 0.0–0.1)
EOS ABS: 0.2 10*3/uL (ref 0.0–0.5)
EOS%: 3.5 % (ref 0.0–7.0)
HCT: 31.7 % — ABNORMAL LOW (ref 38.4–49.9)
HGB: 10.4 g/dL — ABNORMAL LOW (ref 13.0–17.1)
LYMPH%: 13.3 % — ABNORMAL LOW (ref 14.0–49.0)
MCH: 30.4 pg (ref 27.2–33.4)
MCHC: 32.9 g/dL (ref 32.0–36.0)
MCV: 92.3 fL (ref 79.3–98.0)
MONO#: 0.5 10*3/uL (ref 0.1–0.9)
MONO%: 8.4 % (ref 0.0–14.0)
NEUT%: 73.7 % (ref 39.0–75.0)
NEUTROS ABS: 4.3 10*3/uL (ref 1.5–6.5)
Platelets: 306 10*3/uL (ref 140–400)
RBC: 3.43 10*6/uL — ABNORMAL LOW (ref 4.20–5.82)
RDW: 15.3 % — AB (ref 11.0–14.6)
WBC: 5.8 10*3/uL (ref 4.0–10.3)
lymph#: 0.8 10*3/uL — ABNORMAL LOW (ref 0.9–3.3)

## 2013-06-29 LAB — COMPREHENSIVE METABOLIC PANEL (CC13)
ALBUMIN: 3.5 g/dL (ref 3.5–5.0)
ALK PHOS: 68 U/L (ref 40–150)
ALT: 14 U/L (ref 0–55)
AST: 15 U/L (ref 5–34)
Anion Gap: 9 mEq/L (ref 3–11)
BUN: 94.3 mg/dL — ABNORMAL HIGH (ref 7.0–26.0)
CO2: 24 meq/L (ref 22–29)
Calcium: 8.9 mg/dL (ref 8.4–10.4)
Chloride: 106 mEq/L (ref 98–109)
Creatinine: 2.7 mg/dL — ABNORMAL HIGH (ref 0.7–1.3)
Glucose: 134 mg/dl (ref 70–140)
Potassium: 5.1 mEq/L (ref 3.5–5.1)
SODIUM: 139 meq/L (ref 136–145)
TOTAL PROTEIN: 6.2 g/dL — AB (ref 6.4–8.3)
Total Bilirubin: 0.29 mg/dL (ref 0.20–1.20)

## 2013-06-29 MED ORDER — DARBEPOETIN ALFA-POLYSORBATE 300 MCG/0.6ML IJ SOLN
300.0000 ug | Freq: Once | INTRAMUSCULAR | Status: AC
  Administered 2013-06-29: 300 ug via SUBCUTANEOUS
  Filled 2013-06-29: qty 0.6

## 2013-06-29 NOTE — Telephone Encounter (Signed)
gv and printed appt sched and avs for pt for Feb thru April °

## 2013-06-29 NOTE — Progress Notes (Signed)
[name] OFFICE PROGRESS NOTE  No PCP Per Patient [place] [id number]  DIAGNOSIS: Anemia due to chronic renal failure treated with erythropoietin - Plan: CBC with Differential, Comprehensive metabolic panel (Cmet) - [facility], Lactate dehydrogenase (LDH) - [facility], Erythropoietin  CKD (chronic kidney disease) - Plan: CBC with Differential, Comprehensive metabolic panel (Cmet) - [facility], Lactate dehydrogenase (LDH) - [facility], Erythropoietin  Essential thrombocythemia - Plan: CBC with Differential, Comprehensive metabolic panel (Cmet) - [facility], Lactate dehydrogenase (LDH) - [facility]  Retinal vein thrombosis, left  Chief Complaint  Patient presents with  . Essential thrombocythemia    CURRENT THERAPY: 1. Aspirin 81 mg by mouth daily. 2. Anagrelide (Agrylin) 1 mg by mouth daily. 3. Aranesp 300 mcg subcu every 3 weeks for hemoglobin less than 11.  INTERVAL HISTORY: [name] 80 y.o. male history of essential thrombocythemia and anemia due to chronic renal failure treated with erythropoietin is her for 4 month follow-up.  He reports doing very well.  He denies recent hospitalizations or recurrent infections or bleeding.  He denies hematochezia or melena.   His weight is stable.  He did have a sinus infection recently that is now resolving.  He underwent L hand skin biopsy per dermatology on 06/17/2013.  Report as noted below.  His weight is stable.  He is concerned about his shoulder dislocating occasionally and resulting in pain.  He will be evaluated by orthopedic to assess his options.   MEDICAL HISTORY: Past Medical History  Diagnosis Date  . Chronic kidney disease   . Thrombocythemia   . Esophageal stricture   . Anemia   . Anemia due to chronic renal failure treated with erythropoietin 03/24/2011  . Retinal vein thrombosis, left   . Coronary artery disease   . Hypertension     INTERIM HISTORY: has CAD; STRICTURE AND STENOSIS OF ESOPHAGUS; ESOPHAGEAL REFLUX;  Essential thrombocythemia; Anemia due to chronic renal failure treated with erythropoietin; Retinal vein thrombosis, left; Murmur; CKD (chronic kidney disease); Ischemic cardiomyopathy; and Essential hypertension on his problem list.    ALLERGIES:  is allergic to norvasc and feraheme.  MEDICATIONS: has a current medication list which includes the following prescription(s): acyclovir, anagrelide, aspirin, clonidine, fluticasone, furosemide, hydralazine, ketoconazole, losartan, metoprolol succinate, pantoprazole, psyllium, tamsulosin, triamcinolone cream, and zolpidem.  SURGICAL HISTORY:  Past Surgical History  Procedure Laterality Date  . Coronary artery bypass graft  09/13/04  . Replacement total knee  04/19/08    right  . Inguinal hernia repair  05/08/01    right    REVIEW OF SYSTEMS:   Constitutional: Denies fevers, chills or abnormal weight loss Eyes: Denies blurriness of vision Ears, nose, mouth, throat, and face: Denies mucositis or sore throat Respiratory: Denies cough, dyspnea or wheezes Cardiovascular: Denies palpitation, chest discomfort or lower extremity swelling Gastrointestinal:  Denies nausea, heartburn or change in bowel habits Skin: Denies abnormal skin rashes Lymphatics: Denies new lymphadenopathy or easy bruising Neurological:Denies numbness, tingling or new weaknesses Behavioral/Psych: Mood is stable, no new changes  All other systems were reviewed with the patient and are negative.  PHYSICAL EXAMINATION: ECOG PERFORMANCE STATUS: 0 - Asymptomatic  Blood pressure 152/62, pulse 66, temperature 97.7 F (36.5 C), temperature source Oral, resp. rate 20, height 5\' 10"  (1.778 m), weight 157 lb 14.4 oz (71.623 kg).  GENERAL:alert, no distress and comfortable, elderly male  SKIN: skin color, texture, turgor are normal, no rashes; L hand skin surface with a dime sized area healing.  EYES: normal, Conjunctiva are  pink and non-injected, sclera clear OROPHARYNX:no exudate,  no erythema and lips, buccal mucosa, and tongue normal  NECK: supple, thyroid normal size, non-tender, without nodularity LYMPH:  no palpable lymphadenopathy in the cervical, axillary or inguinal LUNGS: clear to auscultation and percussion with normal breathing effort HEART: regular rate & rhythm and no murmurs and no lower extremity edema ABDOMEN:abdomen soft, non-tender and normal bowel sounds Musculoskeletal:no peripheral edema NEURO: alert & oriented x 3 with fluent speech, no focal motor/sensory deficits   LABORATORY DATA: Results for orders placed in visit on 06/29/13 (from the past 48 hour(s))  CBC WITH DIFFERENTIAL     Status: Abnormal   Collection Time    06/29/13  9:59 AM      Result Value Range   WBC 5.8  4.0 - 10.3 10e3/uL   NEUT# 4.3  1.5 - 6.5 10e3/uL   HGB 10.4 (*) 13.0 - 17.1 g/dL   HCT 31.7 (*) 38.4 - 49.9 %   Platelets 306  140 - 400 10e3/uL   MCV 92.3  79.3 - 98.0 fL   MCH 30.4  27.2 - 33.4 pg   MCHC 32.9  32.0 - 36.0 g/dL   RBC 3.43 (*) 4.20 - 5.82 10e6/uL   RDW 15.3 (*) 11.0 - 14.6 %   lymph# 0.8 (*) 0.9 - 3.3 10e3/uL   MONO# 0.5  0.1 - 0.9 10e3/uL   Eosinophils Absolute 0.2  0.0 - 0.5 10e3/uL   Basophils Absolute 0.1  0.0 - 0.1 10e3/uL   NEUT% 73.7  39.0 - 75.0 %   LYMPH% 13.3 (*) 14.0 - 49.0 %   MONO% 8.4  0.0 - 14.0 %   EOS% 3.5  0.0 - 7.0 %   BASO% 1.1  0.0 - 2.0 %  COMPREHENSIVE METABOLIC PANEL (YW73)     Status: Abnormal   Collection Time    06/29/13  9:59 AM      Result Value Range   Sodium 139  136 - 145 mEq/L   Potassium 5.1  3.5 - 5.1 mEq/L   Chloride 106  98 - 109 mEq/L   CO2 24  22 - 29 mEq/L   Glucose 134  70 - 140 mg/dl   BUN 94.3 (*) 7.0 - 26.0 mg/dL   Creatinine 2.7 (*) 0.7 - 1.3 mg/dL   Total Bilirubin 0.29  0.20 - 1.20 mg/dL   Alkaline Phosphatase 68  40 - 150 U/L   AST 15  5 - 34 U/L   ALT 14  0 - 55 U/L   Total Protein 6.2 (*) 6.4 - 8.3 g/dL   Albumin 3.5  3.5 - 5.0 g/dL   Calcium 8.9  8.4 - 10.4 mg/dL   Anion Gap 9  3 - 11  mEq/L    RADIOGRAPHIC STUDIES: No results found.  PATHOLOGY: 06/17/2012 Skin Biopsy-(P), (A) left posterior dorsum of hand, shave & ED&CSQUAMOUS CELL CARCINOMA IN SITU ARISING IN ACTINIC KERATOSISMicroscopic DescriptionThere is keratinocyte atypia of the epidermis with focal full thickness change. Diagnostic infiltrating tumor is not seen. These changes are occurring in actinically damaged skin. 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 This appears to be an early lesion. Bryan Dace MD Dermatopathologist, Electronic Signature (Case signed 06/22/2013)  ASSESSMENT/PLAN:   1. Essential Thrombocythemia (09/1993).  --[name] continues to do fairly well on the current treatment program.  --Platelet counts have been quite satisfactory usually in the 300-400,000 range.   2. Anemia secondary to chronic renal disease. --  Based on hemoglobin of 10.4. He will receive Aranesp today 300 mcg subcu. We are continuing to check CBCs every 3 weeks and we will give him Aranesp 300 mcg subcu whenever the hemoglobin is less than 11.   3. L Posterior dorsum of Hand s/p biopsy. --He will follow up with dermatology prn.   4. Follow-up.  --We will plan to see him again in 4 months, at which time we will check CBC, chemistries and iron studies.  All questions were answered. The patient knows to call the clinic with any problems, questions or concerns. We can certainly see the patient much sooner if necessary.  I spent 10 minutes counseling the patient face to face. The total time spent in the appointment was 15 minutes.    Gorje Iyer, MD 06/29/2013 10:45 AM

## 2013-07-16 ENCOUNTER — Ambulatory Visit (INDEPENDENT_AMBULATORY_CARE_PROVIDER_SITE_OTHER): Payer: Medicare Other | Admitting: Neurology

## 2013-07-16 ENCOUNTER — Encounter (INDEPENDENT_AMBULATORY_CARE_PROVIDER_SITE_OTHER): Payer: Self-pay

## 2013-07-16 ENCOUNTER — Encounter: Payer: Self-pay | Admitting: Neurology

## 2013-07-16 VITALS — BP 130/73 | HR 68 | Ht 70.0 in | Wt 158.0 lb

## 2013-07-16 DIAGNOSIS — R259 Unspecified abnormal involuntary movements: Secondary | ICD-10-CM

## 2013-07-16 DIAGNOSIS — I2510 Atherosclerotic heart disease of native coronary artery without angina pectoris: Secondary | ICD-10-CM

## 2013-07-16 NOTE — Progress Notes (Signed)
PATIENT: [name] DOB: [birth year]  HISTORICAL  [name] is a 80 years old right-handed Caucasian male, referred by neurosurgeon Dr. Sherley Bounds, and his primary care physician Dr. Ardeth Perfect for evaluation of fidgeting movement, gradual onset gait difficulty.  He has past medical history of hypertension, hyperlipidemia  coronary artery disease, status post bypass surgery,  right knee replacement, essential thrombocythemia since 1995, and chronic anemia secondary to chronic renal disease and possibly myeloproliferative disorder, treated with erythropoietin, mild splenomegaly, he has been treated with aspirin, hydroxyurea, anagrelide and Aranesp.  History of retinal vein occlusion involving the left eye status post left vitrectomy on 05/05/2003. Chronic renal insufficiency.   He also had lumbar degenerative disc disease, was evaluated by neurosurgeon Dr. 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  He noticed gradual onset fidgety movement over the past 2 years, he tends to twist his fingers while sitting still, also has gradual onset gait difficulty, he could no longer walk straight-line, but denies fall.  He has no bilateral feet paresthesia, no bowel bladder incontinence, there is no family history of similar disorders.  He has 2 children, old her daughter is 41 years old, healthy.  He denies muscle weakness, no dysarthria, no dysphagia, he denies orthostatic symptoms, he does has constipations    REVIEW OF SYSTEMS: Full 14 system review of systems performed and notable only for constipation, easy bruising, joint  pain,   ALLERGIES: Allergies  Allergen Reactions  . Norvasc [Amlodipine Besylate] Other (See Comments)    TIA's  . Feraheme [Ferumoxytol] Rash    HOME MEDICATIONS: Current Outpatient Prescriptions on File Prior to Visit  Medication Sig Dispense Refill  . acyclovir (ZOVIRAX) 200 MG capsule Take 1 tablet by mouth Daily.      Marland Kitchen anagrelide (AGRYLIN) 1 MG capsule Take 1 capsule daily or as directed  60 capsule  1  . aspirin 81 MG tablet Take 81 mg by mouth daily.        . cloNIDine (CATAPRES) 0.3 MG tablet Take 0.3 mg by mouth 3 (three) times daily.        . fluticasone (FLONASE) 50 MCG/ACT nasal spray As directed      . furosemide (LASIX) 40 MG tablet Take 60 mg by mouth daily.       . hydrALAZINE (APRESOLINE) 25 MG tablet Take 1 tablet (25 mg total) by mouth 3 (three) times daily.  90 tablet  6  . ketoconazole (NIZORAL) 2 % cream       . metoprolol succinate (TOPROL-XL) 100 MG 24 hr tablet take 1 tablet by mouth twice a day WITH OR IMMEDIATELY AFTER A MEAL  60 tablet  PRN  . pantoprazole (PROTONIX) 40 MG tablet take 1 tablet by mouth once daily  30 tablet  2  . psyllium (METAMUCIL) 58.6 % powder Take 1 packet by mouth as needed.        . Tamsulosin HCl (FLOMAX) 0.4 MG CAPS Take 0.4 mg by mouth daily after supper.       . triamcinolone cream (KENALOG) 0.1 % as directed.      . zolpidem (AMBIEN) 5 MG  tablet Take 1 tablet by mouth as needed.       No current facility-administered medications on file prior to visit.    PAST MEDICAL HISTORY: Past Medical History  Diagnosis Date  . Chronic kidney disease   . Thrombocythemia   . Esophageal stricture   . Anemia   . Anemia due to chronic renal failure treated with erythropoietin 03/24/2011  . Retinal vein thrombosis, left   . Coronary artery disease   . Hypertension     PAST SURGICAL HISTORY: Past Surgical History  Procedure Laterality Date  . Coronary artery bypass graft  09/13/04  . Replacement total knee  04/19/08    right  .  Inguinal hernia repair  05/08/01    right    FAMILY HISTORY: Family History  Problem Relation Age of Onset  . Dementia Mother   . Angina Father     SOCIAL HISTORY:  History   Social History  . Marital Status: Married    Spouse Name: [name]    Number of Children: 2  . Years of Education: 12   Occupational History  .      retired   Social History Main Topics  . Smoking status: Former Research scientist (life sciences)  . Smokeless tobacco: Former Systems developer    Types: Chew     Comment: quit 30 +yrs ago  . Alcohol Use: No     Comment: Quit 30 + yrs ago  . Drug Use: No  . Sexual Activity: Not on file   Other Topics Concern  . Not on file   Social History Narrative   Patient lives at home with wife [name]) and son.   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   Generalized: In no acute distress  Neck: Supple, no carotid bruits   Cardiac: Regular rate rhythm  Pulmonary: Clear to auscultation bilaterally  Musculoskeletal: No deformity  Neurological examination  Mentation: Alert oriented to time, place, history taking, and causual conversation, mild hard of hearing, he has mild anterocollis, moderate right tilt, frequent large amplitude chorea like movements involving both arms, and legs  Cranial nerve II-XII: Pupils were equal round reactive to light. Extraocular movements were full.  Visual field were full on confrontational test. Bilateral fundi were sharp.  Facial sensation and strength were normal. Hearing was intact to finger rubbing bilaterally. Uvula tongue midline.  Head turning and shoulder shrug and were normal and symmetric.Tongue protrusion into cheek strength was normal. mild slow spastic tongue movement   Motor: Normal tone, bulk and strength, but he has rebound  signs.   Sensory: Intact to fine touch, pinprick, preserved vibratory sensation, and proprioception at toes.  Coordination:  he has mild  finger to nose dysmetria, moderate bilateral  heel-to-shin dysmetria, moderate trunk ataxia  Gait: Rising up from seated position by pushing on  chair arm, wide based mild unsteady gait, could not perform tandem walking. Romberg signs was positive   Deep tendon reflexes: Brachioradialis 2/2, biceps 2/2, triceps 2/2, patellar 2/2, Achilles 2/2, plantar responses were extensor bilaterally    DIAGNOSTIC DATA (LABS, IMAGING, TESTING) - I reviewed patient records, labs, notes, testing and imaging myself where available.  Lab Results  Component Value Date  WBC 5.8 06/29/2013   HGB 10.4* 06/29/2013   HCT 31.7* 06/29/2013   MCV 92.3 06/29/2013   PLT 306 06/29/2013      Component Value Date/Time   NA 139 06/29/2013 0959   NA 135 01/21/2013 1051   K 5.1 06/29/2013 0959   K 4.9 01/21/2013 1051   CL 107 01/21/2013 1051   CL 109* 10/20/2012 0934   CO2 24 06/29/2013 0959   CO2 23 01/21/2013 1051   GLUCOSE 134 06/29/2013 0959   GLUCOSE 110* 01/21/2013 1051   GLUCOSE 109* 10/20/2012 0934   BUN 94.3* 06/29/2013 0959   BUN 90* 01/21/2013 1051   CREATININE 2.7* 06/29/2013 0959   CREATININE 2.6* 01/21/2013 1051   CALCIUM 8.9 06/29/2013 0959   CALCIUM 9.2 01/21/2013 1051   PROT 6.2* 06/29/2013 0959   PROT 6.0 10/15/2011 1424   ALBUMIN 3.5 06/29/2013 0959   ALBUMIN 4.0 10/15/2011 1424   AST 15 06/29/2013 0959   AST 11 10/15/2011 1424   ALT 14 06/29/2013 0959   ALT <8 10/15/2011 1424   ALKPHOS 68 06/29/2013 0959   ALKPHOS 73 10/15/2011 1424   BILITOT 0.29 06/29/2013 0959   BILITOT 0.3 10/15/2011 1424   Lab Results  Component Value Date   CHOL 110 01/21/2013   LDLCALC 57 01/21/2013   TRIG 113.0 01/21/2013   CHOLHDL 4 01/21/2013    ASSESSMENT AND PLAN  Purnell L Hegg is a 80 y.o. male  with complicated past medical history, including coronary artery disease, essential  thrombocytopenia, presenting with rapid onset chorea- like abnormal movements on examinations, he also has a wide-based unsteady gait, cerebellum signs  1. most likely central nervous system degenerative disorder, mainly involving cerebellum, such as multiple system atrophy   2 proceed with MRI of the brain 3 laboratory evaluations 4 return to clinic in one month. Marcial Pacas, M.D. Ph.D.  [facility] [address], [place],  [address] [phone]

## 2013-07-17 ENCOUNTER — Other Ambulatory Visit: Payer: Self-pay

## 2013-07-17 DIAGNOSIS — N189 Chronic kidney disease, unspecified: Secondary | ICD-10-CM

## 2013-07-17 DIAGNOSIS — D631 Anemia in chronic kidney disease: Secondary | ICD-10-CM

## 2013-07-17 DIAGNOSIS — D473 Essential (hemorrhagic) thrombocythemia: Secondary | ICD-10-CM

## 2013-07-17 LAB — THYROID PANEL WITH TSH
Free Thyroxine Index: 2.2 (ref 1.2–4.9)
T3 Uptake Ratio: 29 % (ref 24–39)
T4 TOTAL: 7.6 ug/dL (ref 4.5–12.0)
TSH: 0.567 u[IU]/mL (ref 0.450–4.500)

## 2013-07-17 LAB — COMPREHENSIVE METABOLIC PANEL
ALT: 19 IU/L (ref 0–44)
AST: 21 IU/L (ref 0–40)
Albumin/Globulin Ratio: 2 (ref 1.1–2.5)
Albumin: 4 g/dL (ref 3.5–4.8)
Alkaline Phosphatase: 72 IU/L (ref 39–117)
BUN/Creatinine Ratio: 31 — ABNORMAL HIGH (ref 10–22)
BUN: 72 mg/dL — ABNORMAL HIGH (ref 8–27)
CHLORIDE: 102 mmol/L (ref 97–108)
CO2: 21 mmol/L (ref 18–29)
Calcium: 9 mg/dL (ref 8.6–10.2)
Creatinine, Ser: 2.29 mg/dL — ABNORMAL HIGH (ref 0.76–1.27)
GFR calc non Af Amer: 26 mL/min/{1.73_m2} — ABNORMAL LOW (ref >59)
GFR, EST AFRICAN AMERICAN: 30 mL/min/{1.73_m2} — AB (ref >59)
GLUCOSE: 112 mg/dL — AB (ref 65–99)
Globulin, Total: 2 g/dL (ref 1.5–4.5)
Potassium: 5 mmol/L (ref 3.5–5.2)
Sodium: 139 mmol/L (ref 134–144)
TOTAL PROTEIN: 6 g/dL (ref 6.0–8.5)
Total Bilirubin: 0.3 mg/dL (ref 0.0–1.2)

## 2013-07-17 LAB — LYME, TOTAL AB TEST/REFLEX: Lyme IgG/IgM Ab: <0.91 {ISR} (ref 0.00–0.90)

## 2013-07-17 LAB — VITAMIN B12: Vitamin B-12: 988 pg/mL — ABNORMAL HIGH (ref 211–946)

## 2013-07-17 LAB — IFE AND PE, SERUM
ALBUMIN SERPL ELPH-MCNC: 3.7 g/dL (ref 3.2–5.6)
ALPHA 1: 0.3 g/dL (ref 0.1–0.4)
Albumin/Glob SerPl: 1.7 (ref 0.7–2.0)
Alpha2 Glob SerPl Elph-Mcnc: 0.6 g/dL (ref 0.4–1.2)
B-Globulin SerPl Elph-Mcnc: 0.8 g/dL (ref 0.6–1.3)
GAMMA GLOB SERPL ELPH-MCNC: 0.6 g/dL (ref 0.5–1.6)
Globulin, Total: 2.3 g/dL (ref 2.0–4.5)
IGG (IMMUNOGLOBIN G), SERUM: 607 mg/dL — AB (ref 700–1600)
IgA/Immunoglobulin A, Serum: 331 mg/dL (ref 91–414)

## 2013-07-17 LAB — C-REACTIVE PROTEIN: CRP: 1 mg/L (ref 0.0–4.9)

## 2013-07-17 LAB — COPPER, SERUM: Copper: 99 ug/dL (ref 72–166)

## 2013-07-17 LAB — SEDIMENTATION RATE: Sed Rate: 9 mm/hr (ref 0–30)

## 2013-07-17 LAB — RPR: SYPHILIS RPR SCR: NONREACTIVE

## 2013-07-17 LAB — HIV ANTIBODY (ROUTINE TESTING W REFLEX)
HIV 1/O/2 Abs-Index Value: <1 (ref <1.00)
HIV-1/HIV-2 Ab: NONREACTIVE

## 2013-07-17 LAB — ANA: Anti Nuclear Antibody(ANA): NEGATIVE

## 2013-07-17 LAB — CERULOPLASMIN: CERULOPLASMIN: 26.3 mg/dL (ref 15.0–30.0)

## 2013-07-17 LAB — FOLATE: Folate: >19.9 ng/mL (ref >3.0)

## 2013-07-20 ENCOUNTER — Ambulatory Visit (HOSPITAL_BASED_OUTPATIENT_CLINIC_OR_DEPARTMENT_OTHER): Payer: Medicare Other

## 2013-07-20 ENCOUNTER — Other Ambulatory Visit (HOSPITAL_BASED_OUTPATIENT_CLINIC_OR_DEPARTMENT_OTHER): Payer: Medicare Other

## 2013-07-20 ENCOUNTER — Telehealth: Payer: Self-pay | Admitting: *Deleted

## 2013-07-20 VITALS — BP 141/57 | HR 67 | Temp 97.6°F

## 2013-07-20 DIAGNOSIS — N039 Chronic nephritic syndrome with unspecified morphologic changes: Secondary | ICD-10-CM

## 2013-07-20 DIAGNOSIS — D631 Anemia in chronic kidney disease: Secondary | ICD-10-CM

## 2013-07-20 DIAGNOSIS — D473 Essential (hemorrhagic) thrombocythemia: Secondary | ICD-10-CM

## 2013-07-20 DIAGNOSIS — N189 Chronic kidney disease, unspecified: Secondary | ICD-10-CM

## 2013-07-20 LAB — CBC WITH DIFFERENTIAL/PLATELET
BASO%: 1.4 % (ref 0.0–2.0)
BASOS ABS: 0.1 10*3/uL (ref 0.0–0.1)
EOS ABS: 0.2 10*3/uL (ref 0.0–0.5)
EOS%: 3.7 % (ref 0.0–7.0)
HCT: 33.8 % — ABNORMAL LOW (ref 38.4–49.9)
HGB: 10.9 g/dL — ABNORMAL LOW (ref 13.0–17.1)
LYMPH#: 1.2 10*3/uL (ref 0.9–3.3)
LYMPH%: 18.7 % (ref 14.0–49.0)
MCH: 29.3 pg (ref 27.2–33.4)
MCHC: 32.2 g/dL (ref 32.0–36.0)
MCV: 91 fL (ref 79.3–98.0)
MONO#: 0.6 10*3/uL (ref 0.1–0.9)
MONO%: 9.8 % (ref 0.0–14.0)
NEUT#: 4.3 10*3/uL (ref 1.5–6.5)
NEUT%: 66.4 % (ref 39.0–75.0)
Platelets: 397 10*3/uL (ref 140–400)
RBC: 3.72 10*6/uL — AB (ref 4.20–5.82)
RDW: 15.7 % — AB (ref 11.0–14.6)
WBC: 6.4 10*3/uL (ref 4.0–10.3)

## 2013-07-20 MED ORDER — DARBEPOETIN ALFA-POLYSORBATE 300 MCG/0.6ML IJ SOLN
300.0000 ug | Freq: Once | INTRAMUSCULAR | Status: AC
  Administered 2013-07-20: 300 ug via SUBCUTANEOUS
  Filled 2013-07-20: qty 0.6

## 2013-07-20 NOTE — Progress Notes (Signed)
Quick Note:  Called and shared normal lab results with patient thru VM message ______

## 2013-07-20 NOTE — Telephone Encounter (Signed)
Called patient and advised him to have Dr. Ronnald Ramp office send over the MRI results to our office. Patient verbalized understanding.

## 2013-07-23 ENCOUNTER — Ambulatory Visit: Payer: Medicare Other | Admitting: Cardiology

## 2013-07-28 ENCOUNTER — Ambulatory Visit: Payer: Medicare Other | Admitting: Cardiology

## 2013-08-07 ENCOUNTER — Other Ambulatory Visit: Payer: Self-pay | Admitting: Medical Oncology

## 2013-08-07 DIAGNOSIS — N189 Chronic kidney disease, unspecified: Principal | ICD-10-CM

## 2013-08-07 DIAGNOSIS — D631 Anemia in chronic kidney disease: Secondary | ICD-10-CM

## 2013-08-10 ENCOUNTER — Ambulatory Visit (HOSPITAL_BASED_OUTPATIENT_CLINIC_OR_DEPARTMENT_OTHER): Payer: Medicare Other

## 2013-08-10 ENCOUNTER — Other Ambulatory Visit (HOSPITAL_BASED_OUTPATIENT_CLINIC_OR_DEPARTMENT_OTHER): Payer: Medicare Other

## 2013-08-10 VITALS — BP 130/53 | HR 65 | Temp 97.2°F

## 2013-08-10 DIAGNOSIS — N039 Chronic nephritic syndrome with unspecified morphologic changes: Secondary | ICD-10-CM

## 2013-08-10 DIAGNOSIS — N189 Chronic kidney disease, unspecified: Secondary | ICD-10-CM

## 2013-08-10 DIAGNOSIS — D631 Anemia in chronic kidney disease: Secondary | ICD-10-CM

## 2013-08-10 LAB — CBC WITH DIFFERENTIAL/PLATELET
BASO%: 1.3 % (ref 0.0–2.0)
BASOS ABS: 0.1 10*3/uL (ref 0.0–0.1)
EOS ABS: 0.2 10*3/uL (ref 0.0–0.5)
EOS%: 4.3 % (ref 0.0–7.0)
HCT: 28.3 % — ABNORMAL LOW (ref 38.4–49.9)
HEMOGLOBIN: 9 g/dL — AB (ref 13.0–17.1)
LYMPH%: 17.1 % (ref 14.0–49.0)
MCH: 28.8 pg (ref 27.2–33.4)
MCHC: 31.7 g/dL — ABNORMAL LOW (ref 32.0–36.0)
MCV: 90.7 fL (ref 79.3–98.0)
MONO#: 0.4 10*3/uL (ref 0.1–0.9)
MONO%: 8.3 % (ref 0.0–14.0)
NEUT%: 69 % (ref 39.0–75.0)
NEUTROS ABS: 3.3 10*3/uL (ref 1.5–6.5)
PLATELETS: 279 10*3/uL (ref 140–400)
RBC: 3.12 10*6/uL — ABNORMAL LOW (ref 4.20–5.82)
RDW: 16.1 % — AB (ref 11.0–14.6)
WBC: 4.8 10*3/uL (ref 4.0–10.3)
lymph#: 0.8 10*3/uL — ABNORMAL LOW (ref 0.9–3.3)

## 2013-08-10 MED ORDER — DARBEPOETIN ALFA-POLYSORBATE 300 MCG/0.6ML IJ SOLN
300.0000 ug | Freq: Once | INTRAMUSCULAR | Status: AC
  Administered 2013-08-10: 300 ug via SUBCUTANEOUS
  Filled 2013-08-10: qty 0.6

## 2013-08-10 NOTE — Patient Instructions (Signed)
Darbepoetin Alfa injection What is this medicine? DARBEPOETIN ALFA (dar be POE e tin AL fa) helps your body make more red blood cells. It is used to treat anemia caused by chronic kidney failure and chemotherapy. This medicine may be used for other purposes; ask your health care provider or pharmacist if you have questions. COMMON BRAND NAME(S): Aranesp What should I tell my health care provider before I take this medicine? They need to know if you have any of these conditions: -blood clotting disorders or history of blood clots -cancer patient not on chemotherapy -cystic fibrosis -heart disease, such as angina, heart failure, or a history of a heart attack -hemoglobin level of 12 g/dL or greater -high blood pressure -low levels of folate, iron, or vitamin B12 -seizures -an unusual or allergic reaction to darbepoetin, erythropoietin, albumin, hamster proteins, latex, other medicines, foods, dyes, or preservatives -pregnant or trying to get pregnant -breast-feeding How should I use this medicine? This medicine is for injection into a vein or under the skin. It is usually given by a health care professional in a hospital or clinic setting. If you get this medicine at home, you will be taught how to prepare and give this medicine. Do not shake the solution before you withdraw a dose. Use exactly as directed. Take your medicine at regular intervals. Do not take your medicine more often than directed. It is important that you put your used needles and syringes in a special sharps container. Do not put them in a trash can. If you do not have a sharps container, call your pharmacist or healthcare provider to get one. Talk to your pediatrician regarding the use of this medicine in children. While this medicine may be used in children as young as 1 year for selected conditions, precautions do apply. Overdosage: If you think you have taken too much of this medicine contact a poison control center or  emergency room at once. NOTE: This medicine is only for you. Do not share this medicine with others. What if I miss a dose? If you miss a dose, take it as soon as you can. If it is almost time for your next dose, take only that dose. Do not take double or extra doses. What may interact with this medicine? Do not take this medicine with any of the following medications: -epoetin alfa This list may not describe all possible interactions. Give your health care provider a list of all the medicines, herbs, non-prescription drugs, or dietary supplements you use. Also tell them if you smoke, drink alcohol, or use illegal drugs. Some items may interact with your medicine. What should I watch for while using this medicine? Visit your prescriber or health care professional for regular checks on your progress and for the needed blood tests and blood pressure measurements. It is especially important for the doctor to make sure your hemoglobin level is in the desired range, to limit the risk of potential side effects and to give you the best benefit. Keep all appointments for any recommended tests. Check your blood pressure as directed. Ask your doctor what your blood pressure should be and when you should contact him or her. As your body makes more red blood cells, you may need to take iron, folic acid, or vitamin B supplements. Ask your doctor or health care provider which products are right for you. If you have kidney disease continue dietary restrictions, even though this medication can make you feel better. Talk with your doctor or health   care professional about the foods you eat and the vitamins that you take. What side effects may I notice from receiving this medicine? Side effects that you should report to your doctor or health care professional as soon as possible: -allergic reactions like skin rash, itching or hives, swelling of the face, lips, or tongue -breathing problems -changes in vision -chest  pain -confusion, trouble speaking or understanding -feeling faint or lightheaded, falls -high blood pressure -muscle aches or pains -pain, swelling, warmth in the leg -rapid weight gain -severe headaches -sudden numbness or weakness of the face, arm or leg -trouble walking, dizziness, loss of balance or coordination -seizures (convulsions) -swelling of the ankles, feet, hands -unusually weak or tired Side effects that usually do not require medical attention (report to your doctor or health care professional if they continue or are bothersome): -diarrhea -fever, chills (flu-like symptoms) -headaches -nausea, vomiting -redness, stinging, or swelling at site where injected This list may not describe all possible side effects. Call your doctor for medical advice about side effects. You may report side effects to FDA at 1-800-FDA-1088. Where should I keep my medicine? Keep out of the reach of children. Store in a refrigerator between 2 and 8 degrees C (36 and 46 degrees F). Do not freeze. Do not shake. Throw away any unused portion if using a single-dose vial. Throw away any unused medicine after the expiration date. NOTE: This sheet is a summary. It may not cover all possible information. If you have questions about this medicine, talk to your doctor, pharmacist, or health care provider.  2014, Elsevier/Gold Standard. (2008-05-04 10:23:57)  

## 2013-08-13 ENCOUNTER — Encounter: Payer: Self-pay | Admitting: Neurology

## 2013-08-13 ENCOUNTER — Ambulatory Visit (INDEPENDENT_AMBULATORY_CARE_PROVIDER_SITE_OTHER): Payer: Medicare Other | Admitting: Neurology

## 2013-08-13 VITALS — BP 127/65 | HR 69 | Ht 70.0 in | Wt 161.0 lb

## 2013-08-13 DIAGNOSIS — R269 Unspecified abnormalities of gait and mobility: Secondary | ICD-10-CM

## 2013-08-13 DIAGNOSIS — I2510 Atherosclerotic heart disease of native coronary artery without angina pectoris: Secondary | ICD-10-CM

## 2013-08-13 DIAGNOSIS — R259 Unspecified abnormal involuntary movements: Secondary | ICD-10-CM

## 2013-08-13 NOTE — Progress Notes (Signed)
PATIENT: [name] DOB: 04-04-1932  HISTORICAL  [name] is a 79 years old right-handed Caucasian male, referred by neurosurgeon Dr. Marikay Alar, and his primary care physician Dr. Link Snuffer for evaluation of fidgeting movement, gradual onset gait difficulty.  He has past medical history of hypertension, hyperlipidemia  coronary artery disease, status post bypass surgery,  right knee replacement, essential thrombocythemia since 1995, and chronic anemia secondary to chronic renal disease and possibly myeloproliferative disorder, treated with erythropoietin, mild splenomegaly, he has been treated with aspirin, hydroxyurea, anagrelide and Aranesp.  History of retinal vein occlusion involving the left eye status post left vitrectomy on 05/05/2003. Chronic renal insufficiency.   He also had lumbar degenerative disc disease, was evaluated by neurosurgeon Dr. Yetta Barre, in December 2014, had 2 epidural injection, which has been very helpful, during the evaluation, he was noticed to have fidgeting movements  MRI of lumbar in November 2014 at [facility] and orthopedic specialist showed degenerative disc disease, moderate stenosis L3-4, left lateral recessed stenosis L2-3,  Spondylolisthesis at L5-S1 with bilateral foraminal stenosis.  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  He noticed gradual onset fidgety movement over the past 2 years, he tends to twist his fingers while sitting still, also has gradual onset gait difficulty, he could no longer walk straight-line, but denies fall.  He has no bilateral feet paresthesia, no bowel bladder incontinence, there is no family history of similar disorders.  He has 2 children, old her daughter is 39 years old, healthy.  He denies  muscle weakness, no dysarthria, no dysphagia, he denies orthostatic symptoms, he does has constipations  UPDATE March 12 th 2105: He drop things from his hands, has more balance problem, clumsy, he straggle a little while taking pills,  Laboratory evaluation showed normal or negative B12, ANA, RPR, protein electrophoresis showed decreased IgG, and IgM   REVIEW OF SYSTEMS: Full 14 system review of systems performed and notable only for constipation, easy bruising, joint pain,   ALLERGIES: Allergies  Allergen Reactions  . Norvasc [Amlodipine Besylate] Other (See Comments)    TIA's  . Feraheme [Ferumoxytol] Rash    HOME MEDICATIONS: Current Outpatient Prescriptions on File Prior to Visit  Medication Sig Dispense Refill  . acyclovir (ZOVIRAX) 200 MG capsule Take 1 tablet by mouth Daily.      Marland Kitchen anagrelide (AGRYLIN) 1 MG capsule Take 1 capsule daily or as directed  60 capsule  1  . aspirin 81 MG tablet Take 81 mg by mouth daily.        Marland Kitchen atorvastatin (LIPITOR) 20 MG tablet Take 20 mg by mouth daily.      . cloNIDine (CATAPRES) 0.3 MG tablet Take 0.3 mg by mouth 3 (three) times daily.        . fluticasone (FLONASE) 50 MCG/ACT nasal spray As directed      . furosemide (LASIX) 40 MG tablet Take 60 mg by mouth daily.       . hydrALAZINE (APRESOLINE) 25 MG tablet Take 1 tablet (25 mg total) by mouth 3 (three) times daily.  90 tablet  6  . ketoconazole (NIZORAL) 2 % cream       . metoprolol succinate (TOPROL-XL) 100 MG 24 hr tablet take 1 tablet by mouth twice a day WITH  OR IMMEDIATELY AFTER A MEAL  60 tablet  PRN  . pantoprazole (PROTONIX) 40 MG tablet take 1 tablet by mouth once daily  30 tablet  2  . psyllium (METAMUCIL) 58.6 % powder Take 1 packet by mouth as needed.        . Tamsulosin HCl (FLOMAX) 0.4 MG CAPS Take 0.4 mg by mouth daily after supper.       . triamcinolone cream (KENALOG) 0.1 % as directed.      . zolpidem (AMBIEN) 5 MG tablet Take 1 tablet by mouth as needed.       No  current facility-administered medications on file prior to visit.    PAST MEDICAL HISTORY: Past Medical History  Diagnosis Date  . Chronic kidney disease   . Thrombocythemia   . Esophageal stricture   . Anemia   . Anemia due to chronic renal failure treated with erythropoietin 03/24/2011  . Retinal vein thrombosis, left   . Coronary artery disease   . Hypertension     PAST SURGICAL HISTORY: Past Surgical History  Procedure Laterality Date  . Coronary artery bypass graft  09/13/04  . Replacement total knee  04/19/08    right  . Inguinal hernia repair  05/08/01    right    FAMILY HISTORY: Family History  Problem Relation Age of Onset  . Dementia Mother   . Angina Father     SOCIAL HISTORY:  History   Social History  . Marital Status: Married    Spouse Name: [name]    Number of Children: 2  . Years of Education: 12   Occupational History  .      retired   Social History Main Topics  . Smoking status: Former Research scientist (life sciences)  . Smokeless tobacco: Former Systems developer    Types: Chew     Comment: quit 30 +yrs ago  . Alcohol Use: No     Comment: Quit 30 + yrs ago  . Drug Use: No  . Sexual Activity: Not on file   Other Topics Concern  . Not on file   Social History Narrative   Patient lives at home with wife [name]) and son.   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   Generalized: In no acute distress  Neck: Supple, no carotid bruits   Cardiac: Regular rate rhythm  Pulmonary: Clear to auscultation bilaterally  Musculoskeletal: No deformity  Neurological examination  Mentation: Alert oriented to time, place, history taking, and causual conversation, mild hard of hearing, he has mild anterocollis, moderate right tilt, frequent large amplitude  chorea like movements involving both arms, and legs  Cranial nerve II-XII: Pupils were equal round reactive to light. Extraocular movements were full.  Visual field were full on confrontational test. Bilateral fundi were sharp.  Facial sensation and strength were normal. Hearing was intact to finger rubbing bilaterally. Uvula tongue midline.  Head turning and shoulder shrug and were normal and symmetric.Tongue protrusion into cheek strength was normal. mild slow spastic tongue movement   Motor: Normal tone, bulk and strength, but he has rebound signs.   Sensory: Intact to fine touch, pinprick, preserved vibratory sensation, and proprioception at toes.  Coordination:  he has  mild  finger to nose dysmetria, moderate bilateral  heel-to-shin dysmetria, moderate trunk ataxia  Gait: Rising up from seated position by pushing on  chair arm, wide based mild unsteady gait, could not perform tandem walking. Romberg signs was positive   Deep tendon reflexes: Brachioradialis 2/2, biceps 2/2, triceps 2/2, patellar 2/2, Achilles 2/2, plantar responses were extensor bilaterally    DIAGNOSTIC DATA (LABS, IMAGING, TESTING) - I reviewed patient records, labs, notes, testing and imaging myself where available.  Lab Results  Component Value Date   WBC 4.8 08/10/2013   HGB 9.0* 08/10/2013   HCT 28.3* 08/10/2013   MCV 90.7 08/10/2013   PLT 279 08/10/2013      Component Value Date/Time   NA 139 06/29/2013 0959   NA 135 01/21/2013 1051   K 5.1 06/29/2013 0959   K 4.9 01/21/2013 1051   CL 107 01/21/2013 1051   CL 109* 10/20/2012 0934   CO2 24 06/29/2013 0959   CO2 23 01/21/2013 1051   GLUCOSE 134 06/29/2013 0959   GLUCOSE 110* 01/21/2013 1051   GLUCOSE 109* 10/20/2012 0934   BUN 94.3* 06/29/2013 0959   BUN 90* 01/21/2013 1051   CREATININE 2.7* 06/29/2013 0959   CREATININE 2.6* 01/21/2013 1051   CALCIUM 8.9 06/29/2013 0959   CALCIUM 9.2 01/21/2013 1051   PROT 6.2* 06/29/2013 0959   PROT 6.0 10/15/2011 1424   ALBUMIN 3.5  06/29/2013 0959   ALBUMIN 4.0 10/15/2011 1424   AST 15 06/29/2013 0959   AST 11 10/15/2011 1424   ALT 14 06/29/2013 0959   ALT <8 10/15/2011 1424   ALKPHOS 68 06/29/2013 0959   ALKPHOS 73 10/15/2011 1424   BILITOT 0.29 06/29/2013 0959   BILITOT 0.3 10/15/2011 1424   Lab Results  Component Value Date   CHOL 110 01/21/2013   LDLCALC 57 01/21/2013   TRIG 113.0 01/21/2013   CHOLHDL 4 01/21/2013    ASSESSMENT AND PLAN  [name] is a 80 y.o. male  with complicated past medical history, including coronary artery disease, essential thrombocytopenia, presenting with rapid onset chorea- like abnormal movements on examinations, he also has a wide-based unsteady gait, cerebellum signs  1. most likely central nervous system degenerative disorder, mainly involving cerebellum, such as multiple system atrophy   2 proceed with MRI of the brain 3  return to clinic in one month.    Marcial Pacas, M.D. Ph.D.  [facility] [address] [place], [place] [address] [phone]

## 2013-08-26 ENCOUNTER — Ambulatory Visit
Admission: RE | Admit: 2013-08-26 | Discharge: 2013-08-26 | Disposition: A | Discharge status: Home / Self Care | Payer: Medicare Other | Source: Ambulatory Visit | Attending: Neurology | Admitting: Neurology

## 2013-08-26 DIAGNOSIS — R259 Unspecified abnormal involuntary movements: Secondary | ICD-10-CM

## 2013-08-27 ENCOUNTER — Other Ambulatory Visit: Payer: Self-pay | Admitting: Cardiology

## 2013-08-28 ENCOUNTER — Other Ambulatory Visit: Payer: Self-pay | Admitting: *Deleted

## 2013-08-31 ENCOUNTER — Other Ambulatory Visit (HOSPITAL_BASED_OUTPATIENT_CLINIC_OR_DEPARTMENT_OTHER): Payer: Medicare Other

## 2013-08-31 ENCOUNTER — Other Ambulatory Visit: Payer: Self-pay | Admitting: Medical Oncology

## 2013-08-31 ENCOUNTER — Ambulatory Visit (HOSPITAL_BASED_OUTPATIENT_CLINIC_OR_DEPARTMENT_OTHER): Payer: Medicare Other

## 2013-08-31 VITALS — BP 155/60 | HR 92 | Temp 98.3°F

## 2013-08-31 DIAGNOSIS — D473 Essential (hemorrhagic) thrombocythemia: Secondary | ICD-10-CM

## 2013-08-31 DIAGNOSIS — D631 Anemia in chronic kidney disease: Secondary | ICD-10-CM

## 2013-08-31 DIAGNOSIS — N189 Chronic kidney disease, unspecified: Secondary | ICD-10-CM

## 2013-08-31 DIAGNOSIS — N039 Chronic nephritic syndrome with unspecified morphologic changes: Secondary | ICD-10-CM

## 2013-08-31 LAB — CBC WITH DIFFERENTIAL/PLATELET
BASO%: 1.3 % (ref 0.0–2.0)
Basophils Absolute: 0.1 10*3/uL (ref 0.0–0.1)
EOS ABS: 0.2 10*3/uL (ref 0.0–0.5)
EOS%: 3.6 % (ref 0.0–7.0)
HCT: 27.1 % — ABNORMAL LOW (ref 38.4–49.9)
HGB: 8.5 g/dL — ABNORMAL LOW (ref 13.0–17.1)
LYMPH#: 0.6 10*3/uL — AB (ref 0.9–3.3)
LYMPH%: 13.8 % — ABNORMAL LOW (ref 14.0–49.0)
MCH: 27.9 pg (ref 27.2–33.4)
MCHC: 31.2 g/dL — ABNORMAL LOW (ref 32.0–36.0)
MCV: 89.4 fL (ref 79.3–98.0)
MONO#: 0.4 10*3/uL (ref 0.1–0.9)
MONO%: 8.9 % (ref 0.0–14.0)
NEUT%: 72.4 % (ref 39.0–75.0)
NEUTROS ABS: 3.4 10*3/uL (ref 1.5–6.5)
PLATELETS: 330 10*3/uL (ref 140–400)
RBC: 3.03 10*6/uL — AB (ref 4.20–5.82)
RDW: 16.4 % — AB (ref 11.0–14.6)
WBC: 4.7 10*3/uL (ref 4.0–10.3)

## 2013-08-31 MED ORDER — DARBEPOETIN ALFA-POLYSORBATE 300 MCG/0.6ML IJ SOLN
300.0000 ug | Freq: Once | INTRAMUSCULAR | Status: AC
  Administered 2013-08-31: 300 ug via SUBCUTANEOUS
  Filled 2013-08-31: qty 0.6

## 2013-09-08 ENCOUNTER — Other Ambulatory Visit: Payer: Self-pay

## 2013-09-08 ENCOUNTER — Ambulatory Visit: Payer: Medicare Other

## 2013-09-08 ENCOUNTER — Telehealth: Payer: Self-pay

## 2013-09-08 ENCOUNTER — Ambulatory Visit (HOSPITAL_COMMUNITY)
Admission: RE | Admit: 2013-09-08 | Discharge: 2013-09-08 | Disposition: A | Discharge status: Home / Self Care | Payer: Medicare Other | Source: Ambulatory Visit | Attending: Internal Medicine | Admitting: Internal Medicine

## 2013-09-08 ENCOUNTER — Other Ambulatory Visit: Payer: Self-pay | Admitting: Internal Medicine

## 2013-09-08 DIAGNOSIS — N189 Chronic kidney disease, unspecified: Secondary | ICD-10-CM | POA: Insufficient documentation

## 2013-09-08 DIAGNOSIS — D631 Anemia in chronic kidney disease: Secondary | ICD-10-CM

## 2013-09-08 DIAGNOSIS — N039 Chronic nephritic syndrome with unspecified morphologic changes: Principal | ICD-10-CM

## 2013-09-08 LAB — PREPARE RBC (CROSSMATCH)

## 2013-09-08 LAB — HOLD TUBE, BLOOD BANK

## 2013-09-08 NOTE — Telephone Encounter (Signed)
S/w [facility], they can transfuse the pt 2 units blood on 3/8. S/w wife that hgb is 7.9 and Dr Juliann Mule ordered 2 units blood. Pt to come to [facility] this pm to get crossmatch drawn. HAR opened. Will explain when (9 am) and where when pt gets to [facility]. Wife expressed understanding.

## 2013-09-08 NOTE — H&P (Signed)
Blood transfusion only.   

## 2013-09-08 NOTE — Telephone Encounter (Signed)
Answering wife's call of 908 am. Pt feels weaker and is wondering if he may need blood. Pt has appt at Dr Ardeth Perfect today. I called his office and asked him to draw CBC to check for [place]. Dr [facility] office will contact us with results. We will then contact pt if blood is needed.

## 2013-09-09 ENCOUNTER — Non-Acute Institutional Stay (HOSPITAL_COMMUNITY)
Admission: AD | Admit: 2013-09-09 | Discharge: 2013-09-09 | Disposition: A | Discharge status: Home / Self Care | Payer: Medicare Other | Source: Ambulatory Visit | Attending: Internal Medicine | Admitting: Internal Medicine

## 2013-09-09 DIAGNOSIS — N039 Chronic nephritic syndrome with unspecified morphologic changes: Principal | ICD-10-CM

## 2013-09-09 DIAGNOSIS — D631 Anemia in chronic kidney disease: Secondary | ICD-10-CM | POA: Insufficient documentation

## 2013-09-09 DIAGNOSIS — N189 Chronic kidney disease, unspecified: Secondary | ICD-10-CM | POA: Insufficient documentation

## 2013-09-09 LAB — PREPARE RBC (CROSSMATCH)

## 2013-09-09 MED ORDER — ACETAMINOPHEN 325 MG PO TABS
650.0000 mg | ORAL_TABLET | Freq: Once | ORAL | Status: AC
  Administered 2013-09-09: 650 mg via ORAL
  Filled 2013-09-09 (×2): qty 2

## 2013-09-09 MED ORDER — HEPARIN SOD (PORK) LOCK FLUSH 100 UNIT/ML IV SOLN: 250.0000 [IU] | INTRAVENOUS | Status: DC | PRN

## 2013-09-09 MED ORDER — SODIUM CHLORIDE 0.9 % IV SOLN
250.0000 mL | Freq: Once | INTRAVENOUS | Status: AC
  Administered 2013-09-09: 250 mL via INTRAVENOUS

## 2013-09-09 MED ORDER — SODIUM CHLORIDE 0.9 % IJ SOLN: 3.0000 mL | INTRAMUSCULAR | Status: DC | PRN

## 2013-09-09 MED ORDER — DIPHENHYDRAMINE HCL 25 MG PO CAPS
25.0000 mg | ORAL_CAPSULE | Freq: Once | ORAL | Status: AC
  Administered 2013-09-09: 25 mg via ORAL
  Filled 2013-09-09: qty 1

## 2013-09-09 MED ORDER — HEPARIN SOD (PORK) LOCK FLUSH 100 UNIT/ML IV SOLN: 500.0000 [IU] | Freq: Every day | INTRAVENOUS | Status: DC | PRN

## 2013-09-09 MED ORDER — SODIUM CHLORIDE 0.9 % IJ SOLN: 10.0000 mL | INTRAMUSCULAR | Status: DC | PRN

## 2013-09-09 MED ORDER — CLONIDINE HCL 0.3 MG PO TABS
0.3000 mg | ORAL_TABLET | Freq: Once | ORAL | Status: DC
  Filled 2013-09-09: qty 1

## 2013-09-09 NOTE — Procedures (Signed)
[name]  Procedure Note  [name]:[id number] DOB: 1932/04/20 DOA: 09/09/2013   PCP: Dr. Keturah Barre Chism  Associated Diagnosis: Anemia due to chronic renal failure treated with erythropoietin     Procedure Note: Transfused 2 Units PRBC   Condition During Procedure: Tolerated well. BP elevated, Patient denies any complaints. MD notified.  Orders to give catapres as taken at home. Patient states will take catapres as soon as he gets to his car. Santiago Glad RN at MD office made aware.    Condition at Discharge: Patient discharged home. No complaints. Patient in no apparent distress. Instructed patient to call the doctor and/or seek medical attention if he has any changes in condition or concerns. Patient acknowledges. Patient left day hospital with belongings ambulatory escorted by nursing staff to his car. Patient noted seated safely and taking catapres. Patient denies any changes in condition.     Wendie Simmer, RN  [facility]

## 2013-09-09 NOTE — H&P (Signed)
Blood transfusion only.   

## 2013-09-09 NOTE — Progress Notes (Signed)
MD notified the patient BP 183/65 HR 52 post 2 Units PRBC given, patient takes catapres TID, due dose @1300 . Orders given and initiated. Ok to discharge patient.

## 2013-09-09 NOTE — Progress Notes (Signed)
Patient BP 166/58, HR 52 during infusion of 2nd unit PRBC. Patient states he takes catapres TID and is due for a dose at 1300.  This RN called Nelida Gores and notified. Juliann Pulse is to notify MD and return call for further instructions.

## 2013-09-10 ENCOUNTER — Other Ambulatory Visit: Payer: Self-pay | Admitting: Cardiology

## 2013-09-10 LAB — TYPE AND SCREEN
ABO/RH(D): A POS
Antibody Screen: NEGATIVE
Unit division: 0
Unit division: 0

## 2013-09-14 ENCOUNTER — Other Ambulatory Visit: Payer: Self-pay | Admitting: Internal Medicine

## 2013-09-14 DIAGNOSIS — D631 Anemia in chronic kidney disease: Secondary | ICD-10-CM

## 2013-09-14 DIAGNOSIS — N189 Chronic kidney disease, unspecified: Principal | ICD-10-CM

## 2013-09-16 ENCOUNTER — Ambulatory Visit (INDEPENDENT_AMBULATORY_CARE_PROVIDER_SITE_OTHER): Payer: Medicare Other | Admitting: Neurology

## 2013-09-16 ENCOUNTER — Encounter: Payer: Self-pay | Admitting: Neurology

## 2013-09-16 VITALS — BP 156/73 | HR 72 | Ht 70.0 in | Wt 166.0 lb

## 2013-09-16 DIAGNOSIS — R269 Unspecified abnormalities of gait and mobility: Secondary | ICD-10-CM

## 2013-09-16 DIAGNOSIS — R259 Unspecified abnormal involuntary movements: Secondary | ICD-10-CM

## 2013-09-16 DIAGNOSIS — I2510 Atherosclerotic heart disease of native coronary artery without angina pectoris: Secondary | ICD-10-CM

## 2013-09-16 MED ORDER — CARBIDOPA-LEVODOPA 25-100 MG PO TABS: 1.0000 | ORAL_TABLET | Freq: Three times a day (TID) | ORAL | Status: DC

## 2013-09-16 NOTE — Progress Notes (Signed)
PATIENT: [name] DOB: 04/16/1932  HISTORICAL  [name] is a 80 years old right-handed Caucasian male, referred by neurosurgeon Dr. Sherley Bounds, and his primary care physician Dr. Ardeth Perfect for evaluation of fidgeting movement, gradual onset gait difficulty.  He has past medical history of hypertension, hyperlipidemia  coronary artery disease, status post bypass surgery,  right knee replacement, essential thrombocythemia since 1995, and chronic anemia secondary to chronic renal disease and possibly myeloproliferative disorder, treated with erythropoietin, mild splenomegaly, he has been treated with aspirin, hydroxyurea, anagrelide and Aranesp.  History of retinal vein occlusion involving the left eye status post left vitrectomy on 05/05/2003. Chronic renal insufficiency.   He also had lumbar degenerative disc disease, was evaluated by neurosurgeon Dr. Ronnald Ramp, in December 2014, had 2 epidural injection, which has been very helpful, during the evaluation, he was noticed to have fidgeting movements  MRI of lumbar in November 2014 at [facility] and orthopedic specialist showed degenerative disc disease, moderate stenosis L3-4, left lateral recessed stenosis L2-3,  Spondylolisthesis at L5-S1 with bilateral foraminal stenosis.  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  He noticed gradual onset fidgety movement over the past 2 years, he tends to twist his fingers while sitting still, also has gradual onset gait difficulty, he could no longer walk straight-line, but denies fall.  He has no bilateral feet paresthesia, no bowel bladder incontinence, there is no family history of similar disorders.  He has 2 children, older daughter is 79 years old, healthy.  He denies  muscle weakness, no dysarthria, no dysphagia, he denies orthostatic symptoms, he does has constipations  Most recent lab evaluation showed mild anemia, with hemoglobin 10 point 7, elevated creatinine 2 point 7, otherwise normal CBC, CMP, ESR, C-reactive protein, thyroid function test,  ceruloplasmin, copper level, negative ANA, RPR, Lyme titer,  protein electrophoresis showed decreased IgG, and IgM, there was no M Spikes  UPDATE September 23, 2013:  His father died at age 73 from heart attack, mother died at age 53s, she has Alzheimer's disease. He denies memory trouble, most bothersome symptoms is his clumsiness, uncoordination, and mild gait difficulty  We have reviewed MRI brain together, there is evidence of mild periventricular small vessel disease, no acute lesions  REVIEW OF SYSTEMS: Full 14 system review of systems performed and notable only for walking difficulty ALLERGIES: Allergies  Allergen Reactions  . Norvasc [Amlodipine Besylate] Other (See Comments)    TIA's  . Feraheme [Ferumoxytol] Rash    HOME MEDICATIONS: Current Outpatient Prescriptions on File Prior to Visit  Medication Sig Dispense Refill  . acyclovir (ZOVIRAX) 200 MG capsule Take 1 tablet by mouth Daily.      Marland Kitchen anagrelide (AGRYLIN) 1 MG capsule take 1 capsule by mouth daily or as directed  60 capsule  1  . aspirin 81 MG tablet Take 81 mg by mouth daily.        Marland Kitchen atorvastatin (LIPITOR) 20 MG tablet Take 20 mg by mouth daily.      . cloNIDine (CATAPRES) 0.3 MG tablet Take 0.3 mg by mouth 3 (three) times daily.        . fluticasone (FLONASE) 50 MCG/ACT nasal spray As directed      . furosemide (LASIX) 40 MG tablet Take 60 mg by  mouth daily.       . hydrALAZINE (APRESOLINE) 25 MG tablet Take 1 tablet (25 mg total) by mouth 3 (three) times daily.  90 tablet  6  . ketoconazole (NIZORAL) 2 % cream       . metoprolol succinate (TOPROL-XL) 100 MG 24 hr tablet TAKE 1 TABLET BY MOUTH TWICE A DAY WITH OR IMMEDIATELY AFTER A  MEAL  60 tablet  3  . pantoprazole (PROTONIX) 40 MG tablet take 1 tablet by mouth once daily  30 tablet  5  . psyllium (METAMUCIL) 58.6 % powder Take 1 packet by mouth as needed.        . Tamsulosin HCl (FLOMAX) 0.4 MG CAPS Take 0.4 mg by mouth daily after supper.       . triamcinolone cream (KENALOG) 0.1 % as directed.      . zolpidem (AMBIEN) 5 MG tablet Take 1 tablet by mouth as needed.       No current facility-administered medications on file prior to visit.    PAST MEDICAL HISTORY: Past Medical History  Diagnosis Date  . Chronic kidney disease   . Thrombocythemia   . Esophageal stricture   . Anemia   . Anemia due to chronic renal failure treated with erythropoietin 03/24/2011  . Retinal vein thrombosis, left   . Coronary artery disease   . Hypertension     PAST SURGICAL HISTORY: Past Surgical History  Procedure Laterality Date  . Coronary artery bypass graft  09/13/04  . Replacement total knee  04/19/08    right  . Inguinal hernia repair  05/08/01    right    FAMILY HISTORY: Family History  Problem Relation Age of Onset  . Dementia Mother   . Angina Father     SOCIAL HISTORY:  History   Social History  . Marital Status: Married    Spouse Name: [name]    Number of Children: 2  . Years of Education: 12   Occupational History  .      retired   Social History Main Topics  . Smoking status: Former Research scientist (life sciences)  . Smokeless tobacco: Former Systems developer    Types: Chew     Comment: quit 30 +yrs ago  . Alcohol Use: No     Comment: Quit 30 + yrs ago  . Drug Use: No  . Sexual Activity: Not on file   Other Topics Concern  . Not on file   Social History Narrative   Patient lives at home with wife [name]) and son.   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   Right handed   Caffeine two cups of coffee daily     PHYSICAL EXAM   Filed Vitals:   09/16/13 0914  BP: 156/73  Pulse: 72  Height: _0  (1.778 m)  Weight: 166 lb (75.297 kg)     Body mass index is  23.82 kg/(m^2).   Generalized: In no acute distress  Neck: Supple, no carotid bruits   Cardiac: Regular rate rhythm  Pulmonary: Clear to auscultation bilaterally  Musculoskeletal: No deformity  Neurological examination  Mentation: Alert oriented to time, place, history taking, and causual conversation, mild hard of hearing, he has mild anterocollis, moderate right tilt, frequent large amplitude chorea like movements involving both arms, and legs  Cranial nerve II-XII: Pupils were equal round reactive to light. Extraocular movements were full.  Visual field were full on confrontational test. Bilateral fundi were sharp.  Facial sensation and strength were normal. Hearing was intact  to finger rubbing bilaterally. Uvula tongue midline.  Head turning and shoulder shrug and were normal and symmetric.Tongue protrusion into cheek strength was normal. mild slow spastic tongue movement   Motor: Normal tone, bulk and strength, there is no rigidity, but he has rebound signs.   Sensory: Intact to fine touch, pinprick, preserved vibratory sensation, and proprioception at toes.  Coordination:  he has mild  finger to nose dysmetria, moderate bilateral  heel-to-shin dysmetria, moderate trunk ataxia  Gait: Rising up from seated position by pushing on  chair arm, wide based mild unsteady gait, could not perform tandem walking. Romberg signs was positive   Deep tendon reflexes: Brachioradialis 2/2, biceps 2/2, triceps 2/2, patellar 2/2, Achilles 2/2, plantar responses were extensor bilaterally    DIAGNOSTIC DATA (LABS, IMAGING, TESTING) - I reviewed patient records, labs, notes, testing and imaging myself where available.  Lab Results  Component Value Date   WBC 4.7 08/31/2013   HGB 8.5* 08/31/2013   HCT 27.1* 08/31/2013   MCV 89.4 08/31/2013   PLT 330 08/31/2013      Component Value Date/Time   NA 139 06/29/2013 0959   NA 135 01/21/2013 1051   K 5.1 06/29/2013 0959   K 4.9 01/21/2013 1051   CL 107  01/21/2013 1051   CL 109* 10/20/2012 0934   CO2 24 06/29/2013 0959   CO2 23 01/21/2013 1051   GLUCOSE 134 06/29/2013 0959   GLUCOSE 110* 01/21/2013 1051   GLUCOSE 109* 10/20/2012 0934   BUN 94.3* 06/29/2013 0959   BUN 90* 01/21/2013 1051   CREATININE 2.7* 06/29/2013 0959   CREATININE 2.6* 01/21/2013 1051   CALCIUM 8.9 06/29/2013 0959   CALCIUM 9.2 01/21/2013 1051   PROT 6.2* 06/29/2013 0959   PROT 6.0 10/15/2011 1424   ALBUMIN 3.5 06/29/2013 0959   ALBUMIN 4.0 10/15/2011 1424   AST 15 06/29/2013 0959   AST 11 10/15/2011 1424   ALT 14 06/29/2013 0959   ALT <8 10/15/2011 1424   ALKPHOS 68 06/29/2013 0959   ALKPHOS 73 10/15/2011 1424   BILITOT 0.29 06/29/2013 0959   BILITOT 0.3 10/15/2011 1424   Lab Results  Component Value Date   CHOL 110 01/21/2013   LDLCALC 57 01/21/2013   TRIG 113.0 01/21/2013   CHOLHDL 4 01/21/2013    ASSESSMENT AND PLAN  Jaceon L Lorimer is a 80 y.o. male  with complicated past medical history, including coronary artery disease, essential thrombocytopenia, presenting with gradual onset chorea- like abnormal movements on examinations, he also has a wide-based unsteady gait, cerebellum signs  1. most likely central nervous system degenerative disorder, mainly involving cerebellum, such as multiple system atrophy, or spinocerebellar ataxia  2. I will give him a trial of sinemet 3. physical therapy 4. Return to clinic in 6-9 months  Marcial Pacas, M.D. Ph.D.  [facility] [address], [place], [place] [address] [phone]

## 2013-09-17 ENCOUNTER — Telehealth: Payer: Self-pay | Admitting: Medical Oncology

## 2013-09-17 ENCOUNTER — Other Ambulatory Visit: Payer: Self-pay | Admitting: Medical Oncology

## 2013-09-17 NOTE — Telephone Encounter (Signed)
[name]- with Dr. Elissa [name] office called stating that they had received some labs with a low HGB. Dr. Marval [name] had set the pt up for Procrit Injecitons 10,000 units every 2 weeks. When they called the pt he informed them sees Dr. Juliann [name] and is getting Aranesp 383mcg in our office every 3 weeks.  I told her he did receive blood 08/09/13 and he is coming back to see Dr. Juliann [name] with labs on 4/20. Dr. Juliann [name] is going to check Iron studies with his labs. I will inform Dr. Juliann [name] of this call.

## 2013-09-21 ENCOUNTER — Other Ambulatory Visit (HOSPITAL_BASED_OUTPATIENT_CLINIC_OR_DEPARTMENT_OTHER): Payer: Medicare Other

## 2013-09-21 ENCOUNTER — Ambulatory Visit (HOSPITAL_BASED_OUTPATIENT_CLINIC_OR_DEPARTMENT_OTHER): Payer: Medicare Other

## 2013-09-21 ENCOUNTER — Ambulatory Visit (HOSPITAL_BASED_OUTPATIENT_CLINIC_OR_DEPARTMENT_OTHER): Payer: Medicare Other | Admitting: Internal Medicine

## 2013-09-21 ENCOUNTER — Encounter (HOSPITAL_COMMUNITY): Payer: Medicare Other

## 2013-09-21 VITALS — BP 120/49 | HR 68 | Temp 98.3°F | Resp 20 | Ht 70.0 in | Wt 167.8 lb

## 2013-09-21 DIAGNOSIS — H348122 Central retinal vein occlusion, left eye, stable: Secondary | ICD-10-CM

## 2013-09-21 DIAGNOSIS — D631 Anemia in chronic kidney disease: Secondary | ICD-10-CM

## 2013-09-21 DIAGNOSIS — N039 Chronic nephritic syndrome with unspecified morphologic changes: Secondary | ICD-10-CM

## 2013-09-21 DIAGNOSIS — D473 Essential (hemorrhagic) thrombocythemia: Secondary | ICD-10-CM

## 2013-09-21 DIAGNOSIS — R269 Unspecified abnormalities of gait and mobility: Secondary | ICD-10-CM

## 2013-09-21 DIAGNOSIS — N189 Chronic kidney disease, unspecified: Secondary | ICD-10-CM

## 2013-09-21 DIAGNOSIS — K222 Esophageal obstruction: Secondary | ICD-10-CM

## 2013-09-21 LAB — CBC WITH DIFFERENTIAL/PLATELET
BASO%: 1 % (ref 0.0–2.0)
BASOS ABS: 0 10*3/uL (ref 0.0–0.1)
EOS ABS: 0.2 10*3/uL (ref 0.0–0.5)
EOS%: 3.7 % (ref 0.0–7.0)
HCT: 27 % — ABNORMAL LOW (ref 38.4–49.9)
HGB: 8.8 g/dL — ABNORMAL LOW (ref 13.0–17.1)
LYMPH#: 0.7 10*3/uL — AB (ref 0.9–3.3)
LYMPH%: 15.1 % (ref 14.0–49.0)
MCH: 28.8 pg (ref 27.2–33.4)
MCHC: 32.5 g/dL (ref 32.0–36.0)
MCV: 88.7 fL (ref 79.3–98.0)
MONO#: 0.5 10*3/uL (ref 0.1–0.9)
MONO%: 10.1 % (ref 0.0–14.0)
NEUT#: 3.4 10*3/uL (ref 1.5–6.5)
NEUT%: 70.1 % (ref 39.0–75.0)
PLATELETS: 328 10*3/uL (ref 140–400)
RBC: 3.05 10*6/uL — ABNORMAL LOW (ref 4.20–5.82)
RDW: 16.2 % — AB (ref 11.0–14.6)
WBC: 4.8 10*3/uL (ref 4.0–10.3)

## 2013-09-21 MED ORDER — DARBEPOETIN ALFA-POLYSORBATE 300 MCG/0.6ML IJ SOLN
300.0000 ug | Freq: Once | INTRAMUSCULAR | Status: AC
  Administered 2013-09-21: 300 ug via SUBCUTANEOUS
  Filled 2013-09-21: qty 0.6

## 2013-09-21 NOTE — Patient Instructions (Signed)
Darbepoetin Alfa injection What is this medicine? DARBEPOETIN ALFA (dar be POE e tin AL fa) helps your body make more red blood cells. It is used to treat anemia caused by chronic kidney failure and chemotherapy. This medicine may be used for other purposes; ask your health care provider or pharmacist if you have questions. COMMON BRAND NAME(S): Aranesp What should I tell my health care provider before I take this medicine? They need to know if you have any of these conditions: -blood clotting disorders or history of blood clots -cancer patient not on chemotherapy -cystic fibrosis -heart disease, such as angina, heart failure, or a history of a heart attack -hemoglobin level of 12 g/dL or greater -high blood pressure -low levels of folate, iron, or vitamin B12 -seizures -an unusual or allergic reaction to darbepoetin, erythropoietin, albumin, hamster proteins, latex, other medicines, foods, dyes, or preservatives -pregnant or trying to get pregnant -breast-feeding How should I use this medicine? This medicine is for injection into a vein or under the skin. It is usually given by a health care professional in a hospital or clinic setting. If you get this medicine at home, you will be taught how to prepare and give this medicine. Do not shake the solution before you withdraw a dose. Use exactly as directed. Take your medicine at regular intervals. Do not take your medicine more often than directed. It is important that you put your used needles and syringes in a special sharps container. Do not put them in a trash can. If you do not have a sharps container, call your pharmacist or healthcare provider to get one. Talk to your pediatrician regarding the use of this medicine in children. While this medicine may be used in children as young as 1 year for selected conditions, precautions do apply. Overdosage: If you think you have taken too much of this medicine contact a poison control center or  emergency room at once. NOTE: This medicine is only for you. Do not share this medicine with others. What if I miss a dose? If you miss a dose, take it as soon as you can. If it is almost time for your next dose, take only that dose. Do not take double or extra doses. What may interact with this medicine? Do not take this medicine with any of the following medications: -epoetin alfa This list may not describe all possible interactions. Give your health care provider a list of all the medicines, herbs, non-prescription drugs, or dietary supplements you use. Also tell them if you smoke, drink alcohol, or use illegal drugs. Some items may interact with your medicine. What should I watch for while using this medicine? Visit your prescriber or health care professional for regular checks on your progress and for the needed blood tests and blood pressure measurements. It is especially important for the doctor to make sure your hemoglobin level is in the desired range, to limit the risk of potential side effects and to give you the best benefit. Keep all appointments for any recommended tests. Check your blood pressure as directed. Ask your doctor what your blood pressure should be and when you should contact him or her. As your body makes more red blood cells, you may need to take iron, folic acid, or vitamin B supplements. Ask your doctor or health care provider which products are right for you. If you have kidney disease continue dietary restrictions, even though this medication can make you feel better. Talk with your doctor or health   care professional about the foods you eat and the vitamins that you take. What side effects may I notice from receiving this medicine? Side effects that you should report to your doctor or health care professional as soon as possible: -allergic reactions like skin rash, itching or hives, swelling of the face, lips, or tongue -breathing problems -changes in vision -chest  pain -confusion, trouble speaking or understanding -feeling faint or lightheaded, falls -high blood pressure -muscle aches or pains -pain, swelling, warmth in the leg -rapid weight gain -severe headaches -sudden numbness or weakness of the face, arm or leg -trouble walking, dizziness, loss of balance or coordination -seizures (convulsions) -swelling of the ankles, feet, hands -unusually weak or tired Side effects that usually do not require medical attention (report to your doctor or health care professional if they continue or are bothersome): -diarrhea -fever, chills (flu-like symptoms) -headaches -nausea, vomiting -redness, stinging, or swelling at site where injected This list may not describe all possible side effects. Call your doctor for medical advice about side effects. You may report side effects to FDA at 1-800-FDA-1088. Where should I keep my medicine? Keep out of the reach of children. Store in a refrigerator between 2 and 8 degrees C (36 and 46 degrees F). Do not freeze. Do not shake. Throw away any unused portion if using a single-dose vial. Throw away any unused medicine after the expiration date. NOTE: This sheet is a summary. It may not cover all possible information. If you have questions about this medicine, talk to your doctor, pharmacist, or health care provider.  2014, Elsevier/Gold Standard. (2008-05-04 10:23:57)  

## 2013-09-21 NOTE — Progress Notes (Signed)
[facility] OFFICE PROGRESS NOTE  [name], MD [address] [address]  DIAGNOSIS: Anemia due to chronic renal failure treated with erythropoietin - Plan: CBC with Differential, Comprehensive metabolic panel (Cmet) - [facility]  Essential thrombocythemia - Plan: CBC with Differential, Comprehensive metabolic panel (Cmet) - [facility]  Retinal vein thrombosis, left  Stricture and stenosis of esophagus  CKD (chronic kidney disease) - Plan: CBC with Differential, Comprehensive metabolic panel (Cmet) - [facility]  Chief Complaint  Patient presents with  . Anemia due to chronic renal failure treated with erythropoie    CURRENT THERAPY: 1. Aspirin 81 mg by mouth daily. 2. Anagrelide (Agrylin) 1 mg by mouth daily. 3. Aranesp 300 mcg subcu every 3 weeks for hemoglobin less than 11.  Aranesp 300 mcg subcu every 2 weeks for hemoglobin less than 11.   INTERVAL HISTORY: [name] 80 y.o. male history of essential thrombocythemia and anemia due to chronic renal failure treated with erythropoietin is her for 4 month follow-up.  He reports doing very well.  He denies recent hospitalizations or recurrent infections or bleeding.  He denies hematochezia or melena.   His weight is stable. His weight is stable.  He saw neurology for balance problems and had an MRI of brain done which he reports was negative.  He was given medicine (sinemet) for parkinson's disease.   He has not started it.  He received 2 units of packed RBCs on 09/08/2013.   MEDICAL HISTORY: Past Medical History  Diagnosis Date  . Chronic kidney disease   . Thrombocythemia   . Esophageal stricture   . Anemia   . Anemia due to chronic renal failure treated with erythropoietin 03/24/2011  . Retinal vein thrombosis, left   . Coronary artery disease   . Hypertension     INTERIM HISTORY: has CAD; STRICTURE AND STENOSIS OF ESOPHAGUS; ESOPHAGEAL REFLUX; Essential thrombocythemia; Anemia due to chronic renal failure  treated with erythropoietin; Retinal vein thrombosis, left; Murmur; CKD (chronic kidney disease); Ischemic cardiomyopathy; Essential hypertension; Abnormal involuntary movement; and Abnormality of gait on his problem list.    ALLERGIES:  is allergic to norvasc and feraheme.  MEDICATIONS: has a current medication list which includes the following prescription(s): acyclovir, alprazolam, anagrelide, aspirin, atorvastatin, clonidine, fluticasone, furosemide, hydralazine, ketoconazole, levofloxacin, metoprolol succinate, pantoprazole, psyllium, tamsulosin, triamcinolone cream, zolpidem, and carbidopa-levodopa.  SURGICAL HISTORY:  Past Surgical History  Procedure Laterality Date  . Coronary artery bypass graft  09/13/04  . Replacement total knee  04/19/08    right  . Inguinal hernia repair  05/08/01    right    REVIEW OF SYSTEMS:   Constitutional: Denies fevers, chills or abnormal weight loss Eyes: Denies blurriness of vision Ears, nose, mouth, throat, and face: Denies mucositis or sore throat Respiratory: Denies cough, dyspnea or wheezes Cardiovascular: Denies palpitation, chest discomfort or lower extremity swelling Gastrointestinal:  Denies nausea, heartburn or change in bowel habits Skin: Denies abnormal skin rashes Lymphatics: Denies new lymphadenopathy or easy bruising Neurological:Denies numbness, tingling or new weaknesses Behavioral/Psych: Mood is stable, no new changes  All other systems were reviewed with the patient and are negative.  PHYSICAL EXAMINATION: ECOG PERFORMANCE STATUS: 0 - Asymptomatic  Blood pressure 120/49, pulse 68, temperature 98.3 F (36.8 C), temperature source Oral, resp. rate 20, height 5\' 10"  (1.778 m), weight 167 lb 12.8 oz (76.114 kg).  GENERAL:alert, no distress and comfortable, elderly male  SKIN: skin color, texture, turgor are normal, no rashes EYES: normal, Conjunctiva are pink and  non-injected, sclera clear OROPHARYNX:no exudate, no erythema and  lips, buccal mucosa, and tongue normal  NECK: supple, thyroid normal size, non-tender, without nodularity LYMPH:  no palpable lymphadenopathy in the cervical, axillary or inguinal LUNGS: clear to auscultation and percussion with normal breathing effort HEART: regular rate & rhythm and no murmurs and no lower extremity edema ABDOMEN:abdomen soft, non-tender and normal bowel sounds Musculoskeletal:no peripheral edema NEURO: alert & oriented x 3 with fluent speech, no focal motor/sensory deficits; shuffling gait noted.    LABORATORY DATA: Results for orders placed in visit on 09/21/13 (from the past 48 hour(s))  CBC WITH DIFFERENTIAL     Status: Abnormal   Collection Time    09/21/13  3:19 PM      Result Value Ref Range   WBC 4.8  4.0 - 10.3 10e3/uL   NEUT# 3.4  1.5 - 6.5 10e3/uL   HGB 8.8 (*) 13.0 - 17.1 g/dL   HCT 27.0 (*) 38.4 - 49.9 %   Platelets 328  140 - 400 10e3/uL   MCV 88.7  79.3 - 98.0 fL   MCH 28.8  27.2 - 33.4 pg   MCHC 32.5  32.0 - 36.0 g/dL   RBC 3.05 (*) 4.20 - 5.82 10e6/uL   RDW 16.2 (*) 11.0 - 14.6 %   lymph# 0.7 (*) 0.9 - 3.3 10e3/uL   MONO# 0.5  0.1 - 0.9 10e3/uL   Eosinophils Absolute 0.2  0.0 - 0.5 10e3/uL   Basophils Absolute 0.0  0.0 - 0.1 10e3/uL   NEUT% 70.1  39.0 - 75.0 %   LYMPH% 15.1  14.0 - 49.0 %   MONO% 10.1  0.0 - 14.0 %   EOS% 3.7  0.0 - 7.0 %   BASO% 1.0  0.0 - 2.0 %    RADIOGRAPHIC STUDIES: No results found.  ASSESSMENT/PLAN:   1. Essential Thrombocythemia (09/1993).  --Mr. Killgore continues to do fairly well on the current treatment program.  --Platelet counts have been quite satisfactory usually in the 300-400,000 range.   2. Anemia secondary to chronic renal disease. --Based on hemoglobin of 18.8. . He will receive Aranesp today 300 mcg subcu today. We will change to check CBCs every 2 weeks (from every 3 weeksw) and we will give him Aranesp 300 mcg subcu whenever the hemoglobin is less than 11.   3. Shuffling gait. --He will follow  up with neurology prn. He was started on sinemet. He was counseled on fall precautions.   4. Follow-up.  --We will plan to see him again in 4 months, at which time we will check CBC, chemistries and iron studies.  All questions were answered. The patient knows to call the clinic with any problems, questions or concerns. We can certainly see the patient much sooner if necessary.  I spent 10 minutes counseling the patient face to face. The total time spent in the appointment was 15 minutes.    Concha Norway, MD 09/21/2013 4:48 PM

## 2013-09-22 ENCOUNTER — Telehealth: Payer: Self-pay | Admitting: Internal Medicine

## 2013-09-22 LAB — IRON AND TIBC CHCC
%SAT: 15 % — AB (ref 20–55)
IRON: 40 ug/dL — AB (ref 42–163)
TIBC: 268 ug/dL (ref 202–409)
UIBC: 228 ug/dL (ref 117–376)

## 2013-09-22 LAB — FERRITIN CHCC: Ferritin: 37 ng/ml (ref 22–316)

## 2013-09-22 NOTE — Telephone Encounter (Signed)
pt called to sched apt...done pt aware of appts and will come pick up new sched

## 2013-09-25 ENCOUNTER — Telehealth: Payer: Self-pay

## 2013-09-25 NOTE — Telephone Encounter (Signed)
Pt called asking for iron studies to be added to lab work on 5/4. He states he still feels weak like at last OV. This message passed to Dr Juliann Mule.

## 2013-09-28 ENCOUNTER — Telehealth: Payer: Self-pay | Admitting: Internal Medicine

## 2013-09-28 ENCOUNTER — Other Ambulatory Visit: Payer: Self-pay | Admitting: Internal Medicine

## 2013-09-28 ENCOUNTER — Telehealth: Payer: Self-pay | Admitting: Medical Oncology

## 2013-09-28 ENCOUNTER — Telehealth: Payer: Self-pay | Admitting: *Deleted

## 2013-09-28 NOTE — Telephone Encounter (Signed)
Per staff message and POF I have scheduled appts.  JMW  

## 2013-09-28 NOTE — Telephone Encounter (Signed)
Sent michelle a staff message to add the pt in for an iron infusion for tomorrow per dr Boyce Medici order.

## 2013-09-28 NOTE — Telephone Encounter (Signed)
lmonvm advising the pt of her iron infusion appt on 09/29/2013.

## 2013-09-28 NOTE — Telephone Encounter (Signed)
I spoke with Shirley-wife to inform her that Dr. Juliann Mule is going to set pt up for some feraheme one day this week. She states he just feels weak. She is aware the schedulers will call him with a date and time.

## 2013-09-29 ENCOUNTER — Ambulatory Visit (HOSPITAL_BASED_OUTPATIENT_CLINIC_OR_DEPARTMENT_OTHER): Payer: Medicare Other

## 2013-09-29 VITALS — BP 130/61 | HR 66 | Temp 96.4°F

## 2013-09-29 DIAGNOSIS — N189 Chronic kidney disease, unspecified: Secondary | ICD-10-CM

## 2013-09-29 DIAGNOSIS — D631 Anemia in chronic kidney disease: Secondary | ICD-10-CM

## 2013-09-29 DIAGNOSIS — N039 Chronic nephritic syndrome with unspecified morphologic changes: Secondary | ICD-10-CM

## 2013-09-29 MED ORDER — METHYLPREDNISOLONE SODIUM SUCC 40 MG IJ SOLR
20.0000 mg | Freq: Once | INTRAMUSCULAR | Status: AC
Start: 2013-09-30 — End: 2013-09-29
  Administered 2013-09-29: 20 mg via INTRAVENOUS

## 2013-09-29 MED ORDER — DIPHENHYDRAMINE HCL 25 MG PO CAPS
25.0000 mg | ORAL_CAPSULE | Freq: Once | ORAL | Status: AC
  Administered 2013-09-29: 25 mg via ORAL

## 2013-09-29 MED ORDER — SODIUM CHLORIDE 0.9 % IV SOLN
1020.0000 mg | Freq: Once | INTRAVENOUS | Status: AC
  Administered 2013-09-29: 1020 mg via INTRAVENOUS
  Filled 2013-09-29: qty 34

## 2013-09-29 MED ORDER — METHYLPREDNISOLONE SODIUM SUCC 40 MG IJ SOLR
INTRAMUSCULAR | Status: AC
  Filled 2013-09-29: qty 1

## 2013-09-29 MED ORDER — DIPHENHYDRAMINE HCL 25 MG PO CAPS
ORAL_CAPSULE | ORAL | Status: AC
  Filled 2013-09-29: qty 1

## 2013-09-29 NOTE — Patient Instructions (Signed)

## 2013-10-05 ENCOUNTER — Ambulatory Visit (HOSPITAL_BASED_OUTPATIENT_CLINIC_OR_DEPARTMENT_OTHER): Payer: Medicare Other

## 2013-10-05 ENCOUNTER — Other Ambulatory Visit (HOSPITAL_BASED_OUTPATIENT_CLINIC_OR_DEPARTMENT_OTHER): Payer: Medicare Other

## 2013-10-05 VITALS — BP 129/57 | HR 67 | Temp 98.5°F

## 2013-10-05 DIAGNOSIS — D631 Anemia in chronic kidney disease: Secondary | ICD-10-CM

## 2013-10-05 DIAGNOSIS — N189 Chronic kidney disease, unspecified: Secondary | ICD-10-CM

## 2013-10-05 DIAGNOSIS — N039 Chronic nephritic syndrome with unspecified morphologic changes: Secondary | ICD-10-CM

## 2013-10-05 LAB — CBC WITH DIFFERENTIAL/PLATELET
BASO%: 0.6 % (ref 0.0–2.0)
Basophils Absolute: 0 10*3/uL (ref 0.0–0.1)
EOS%: 3.5 % (ref 0.0–7.0)
Eosinophils Absolute: 0.2 10*3/uL (ref 0.0–0.5)
HCT: 29.1 % — ABNORMAL LOW (ref 38.4–49.9)
HGB: 8.9 g/dL — ABNORMAL LOW (ref 13.0–17.1)
LYMPH%: 17.8 % (ref 14.0–49.0)
MCH: 28.5 pg (ref 27.2–33.4)
MCHC: 30.6 g/dL — AB (ref 32.0–36.0)
MCV: 93.3 fL (ref 79.3–98.0)
MONO#: 0.5 10*3/uL (ref 0.1–0.9)
MONO%: 9.4 % (ref 0.0–14.0)
NEUT#: 3.5 10*3/uL (ref 1.5–6.5)
NEUT%: 68.7 % (ref 39.0–75.0)
PLATELETS: 346 10*3/uL (ref 140–400)
RBC: 3.12 10*6/uL — ABNORMAL LOW (ref 4.20–5.82)
RDW: 16.9 % — ABNORMAL HIGH (ref 11.0–14.6)
WBC: 5.1 10*3/uL (ref 4.0–10.3)
lymph#: 0.9 10*3/uL (ref 0.9–3.3)

## 2013-10-05 MED ORDER — DARBEPOETIN ALFA-POLYSORBATE 300 MCG/0.6ML IJ SOLN
300.0000 ug | Freq: Once | INTRAMUSCULAR | Status: AC
  Administered 2013-10-05: 300 ug via SUBCUTANEOUS
  Filled 2013-10-05: qty 0.6

## 2013-10-19 ENCOUNTER — Other Ambulatory Visit (HOSPITAL_BASED_OUTPATIENT_CLINIC_OR_DEPARTMENT_OTHER): Payer: Medicare Other

## 2013-10-19 ENCOUNTER — Ambulatory Visit (HOSPITAL_BASED_OUTPATIENT_CLINIC_OR_DEPARTMENT_OTHER): Payer: Medicare Other

## 2013-10-19 VITALS — BP 121/57 | HR 66 | Temp 98.1°F

## 2013-10-19 DIAGNOSIS — D631 Anemia in chronic kidney disease: Secondary | ICD-10-CM

## 2013-10-19 DIAGNOSIS — N039 Chronic nephritic syndrome with unspecified morphologic changes: Secondary | ICD-10-CM

## 2013-10-19 DIAGNOSIS — N189 Chronic kidney disease, unspecified: Secondary | ICD-10-CM

## 2013-10-19 LAB — CBC WITH DIFFERENTIAL/PLATELET
BASO%: 1.2 % (ref 0.0–2.0)
Basophils Absolute: 0.1 10*3/uL (ref 0.0–0.1)
EOS%: 4.6 % (ref 0.0–7.0)
Eosinophils Absolute: 0.2 10*3/uL (ref 0.0–0.5)
HEMATOCRIT: 29.7 % — AB (ref 38.4–49.9)
HGB: 9.5 g/dL — ABNORMAL LOW (ref 13.0–17.1)
LYMPH#: 0.8 10*3/uL — AB (ref 0.9–3.3)
LYMPH%: 15.7 % (ref 14.0–49.0)
MCH: 29.9 pg (ref 27.2–33.4)
MCHC: 32.1 g/dL (ref 32.0–36.0)
MCV: 93.1 fL (ref 79.3–98.0)
MONO#: 0.6 10*3/uL (ref 0.1–0.9)
MONO%: 10.6 % (ref 0.0–14.0)
NEUT#: 3.5 10*3/uL (ref 1.5–6.5)
NEUT%: 67.9 % (ref 39.0–75.0)
Platelets: 406 10*3/uL — ABNORMAL HIGH (ref 140–400)
RBC: 3.19 10*6/uL — AB (ref 4.20–5.82)
RDW: 18.9 % — ABNORMAL HIGH (ref 11.0–14.6)
WBC: 5.2 10*3/uL (ref 4.0–10.3)

## 2013-10-19 MED ORDER — DARBEPOETIN ALFA-POLYSORBATE 300 MCG/0.6ML IJ SOLN
300.0000 ug | Freq: Once | INTRAMUSCULAR | Status: AC
  Administered 2013-10-19: 300 ug via SUBCUTANEOUS
  Filled 2013-10-19: qty 0.6

## 2013-10-22 ENCOUNTER — Other Ambulatory Visit: Payer: Self-pay | Admitting: *Deleted

## 2013-10-22 DIAGNOSIS — Z01810 Encounter for preprocedural cardiovascular examination: Secondary | ICD-10-CM

## 2013-10-22 DIAGNOSIS — N184 Chronic kidney disease, stage 4 (severe): Secondary | ICD-10-CM

## 2013-10-30 ENCOUNTER — Other Ambulatory Visit: Payer: Self-pay

## 2013-11-02 ENCOUNTER — Other Ambulatory Visit (HOSPITAL_BASED_OUTPATIENT_CLINIC_OR_DEPARTMENT_OTHER): Payer: Medicare Other

## 2013-11-02 ENCOUNTER — Ambulatory Visit (HOSPITAL_BASED_OUTPATIENT_CLINIC_OR_DEPARTMENT_OTHER): Payer: Medicare Other

## 2013-11-02 VITALS — BP 130/56 | HR 57 | Temp 98.1°F

## 2013-11-02 DIAGNOSIS — D631 Anemia in chronic kidney disease: Secondary | ICD-10-CM

## 2013-11-02 DIAGNOSIS — N189 Chronic kidney disease, unspecified: Secondary | ICD-10-CM

## 2013-11-02 DIAGNOSIS — N039 Chronic nephritic syndrome with unspecified morphologic changes: Secondary | ICD-10-CM

## 2013-11-02 DIAGNOSIS — D473 Essential (hemorrhagic) thrombocythemia: Secondary | ICD-10-CM

## 2013-11-02 LAB — CBC WITH DIFFERENTIAL/PLATELET
BASO%: 1.3 % (ref 0.0–2.0)
BASOS ABS: 0.1 10*3/uL (ref 0.0–0.1)
EOS%: 3.9 % (ref 0.0–7.0)
Eosinophils Absolute: 0.2 10*3/uL (ref 0.0–0.5)
HEMATOCRIT: 27.9 % — AB (ref 38.4–49.9)
HEMOGLOBIN: 8.9 g/dL — AB (ref 13.0–17.1)
LYMPH%: 13.9 % — ABNORMAL LOW (ref 14.0–49.0)
MCH: 30 pg (ref 27.2–33.4)
MCHC: 32 g/dL (ref 32.0–36.0)
MCV: 93.6 fL (ref 79.3–98.0)
MONO#: 0.4 10*3/uL (ref 0.1–0.9)
MONO%: 7.5 % (ref 0.0–14.0)
NEUT#: 3.5 10*3/uL (ref 1.5–6.5)
NEUT%: 73.4 % (ref 39.0–75.0)
Platelets: 307 10*3/uL (ref 140–400)
RBC: 2.98 10*6/uL — ABNORMAL LOW (ref 4.20–5.82)
RDW: 17.1 % — ABNORMAL HIGH (ref 11.0–14.6)
WBC: 4.8 10*3/uL (ref 4.0–10.3)
lymph#: 0.7 10*3/uL — ABNORMAL LOW (ref 0.9–3.3)

## 2013-11-02 LAB — COMPREHENSIVE METABOLIC PANEL (CC13)
ALT: 12 U/L (ref 0–55)
ANION GAP: 12 meq/L — AB (ref 3–11)
AST: 14 U/L (ref 5–34)
Albumin: 3.2 g/dL — ABNORMAL LOW (ref 3.5–5.0)
Alkaline Phosphatase: 70 U/L (ref 40–150)
BUN: 83.6 mg/dL — AB (ref 7.0–26.0)
CALCIUM: 7.9 mg/dL — AB (ref 8.4–10.4)
CHLORIDE: 102 meq/L (ref 98–109)
CO2: 18 meq/L — AB (ref 22–29)
CREATININE: 2.8 mg/dL — AB (ref 0.7–1.3)
Glucose: 124 mg/dl (ref 70–140)
Potassium: 5 mEq/L (ref 3.5–5.1)
Sodium: 132 mEq/L — ABNORMAL LOW (ref 136–145)
Total Bilirubin: 0.27 mg/dL (ref 0.20–1.20)
Total Protein: 5.7 g/dL — ABNORMAL LOW (ref 6.4–8.3)

## 2013-11-02 LAB — LACTATE DEHYDROGENASE (CC13): LDH: 176 U/L (ref 125–245)

## 2013-11-02 MED ORDER — DARBEPOETIN ALFA-POLYSORBATE 300 MCG/0.6ML IJ SOLN
300.0000 ug | Freq: Once | INTRAMUSCULAR | Status: AC
  Administered 2013-11-02: 300 ug via SUBCUTANEOUS
  Filled 2013-11-02: qty 0.6

## 2013-11-04 LAB — ERYTHROPOIETIN: ERYTHROPOIETIN: 33.9 m[IU]/mL — AB (ref 2.6–18.5)

## 2013-11-16 ENCOUNTER — Other Ambulatory Visit (HOSPITAL_BASED_OUTPATIENT_CLINIC_OR_DEPARTMENT_OTHER): Payer: Medicare Other

## 2013-11-16 ENCOUNTER — Ambulatory Visit (HOSPITAL_BASED_OUTPATIENT_CLINIC_OR_DEPARTMENT_OTHER): Payer: Medicare Other

## 2013-11-16 ENCOUNTER — Telehealth: Payer: Self-pay | Admitting: Internal Medicine

## 2013-11-16 ENCOUNTER — Encounter: Payer: Self-pay | Admitting: Internal Medicine

## 2013-11-16 ENCOUNTER — Ambulatory Visit (HOSPITAL_BASED_OUTPATIENT_CLINIC_OR_DEPARTMENT_OTHER): Payer: Medicare Other | Admitting: Internal Medicine

## 2013-11-16 VITALS — BP 124/46 | HR 62 | Temp 97.2°F | Resp 18 | Ht 70.0 in | Wt 158.4 lb

## 2013-11-16 DIAGNOSIS — N039 Chronic nephritic syndrome with unspecified morphologic changes: Secondary | ICD-10-CM

## 2013-11-16 DIAGNOSIS — N189 Chronic kidney disease, unspecified: Secondary | ICD-10-CM

## 2013-11-16 DIAGNOSIS — D631 Anemia in chronic kidney disease: Secondary | ICD-10-CM

## 2013-11-16 DIAGNOSIS — D473 Essential (hemorrhagic) thrombocythemia: Secondary | ICD-10-CM

## 2013-11-16 DIAGNOSIS — R269 Unspecified abnormalities of gait and mobility: Secondary | ICD-10-CM

## 2013-11-16 LAB — CBC WITH DIFFERENTIAL/PLATELET
BASO%: 1.2 % (ref 0.0–2.0)
Basophils Absolute: 0.1 10*3/uL (ref 0.0–0.1)
EOS%: 4.7 % (ref 0.0–7.0)
Eosinophils Absolute: 0.3 10*3/uL (ref 0.0–0.5)
HCT: 28.5 % — ABNORMAL LOW (ref 38.4–49.9)
HGB: 9.2 g/dL — ABNORMAL LOW (ref 13.0–17.1)
LYMPH#: 0.7 10*3/uL — AB (ref 0.9–3.3)
LYMPH%: 13.4 % — ABNORMAL LOW (ref 14.0–49.0)
MCH: 29.5 pg (ref 27.2–33.4)
MCHC: 32.1 g/dL (ref 32.0–36.0)
MCV: 91.9 fL (ref 79.3–98.0)
MONO#: 0.6 10*3/uL (ref 0.1–0.9)
MONO%: 10.6 % (ref 0.0–14.0)
NEUT#: 3.9 10*3/uL (ref 1.5–6.5)
NEUT%: 70.1 % (ref 39.0–75.0)
Platelets: 381 10*3/uL (ref 140–400)
RBC: 3.1 10*6/uL — AB (ref 4.20–5.82)
RDW: 15.6 % — AB (ref 11.0–14.6)
WBC: 5.5 10*3/uL (ref 4.0–10.3)

## 2013-11-16 LAB — COMPREHENSIVE METABOLIC PANEL (CC13)
ALBUMIN: 3.1 g/dL — AB (ref 3.5–5.0)
ALK PHOS: 78 U/L (ref 40–150)
ALT: 16 U/L (ref 0–55)
AST: 15 U/L (ref 5–34)
Anion Gap: 9 mEq/L (ref 3–11)
BUN: 68.5 mg/dL — ABNORMAL HIGH (ref 7.0–26.0)
CALCIUM: 8.2 mg/dL — AB (ref 8.4–10.4)
CHLORIDE: 101 meq/L (ref 98–109)
CO2: 25 mEq/L (ref 22–29)
Creatinine: 2.6 mg/dL — ABNORMAL HIGH (ref 0.7–1.3)
Glucose: 122 mg/dl (ref 70–140)
Potassium: 4.5 mEq/L (ref 3.5–5.1)
SODIUM: 135 meq/L — AB (ref 136–145)
Total Bilirubin: 0.35 mg/dL (ref 0.20–1.20)
Total Protein: 5.7 g/dL — ABNORMAL LOW (ref 6.4–8.3)

## 2013-11-16 MED ORDER — DARBEPOETIN ALFA-POLYSORBATE 300 MCG/0.6ML IJ SOLN
300.0000 ug | Freq: Once | INTRAMUSCULAR | Status: AC
  Administered 2013-11-16: 300 ug via SUBCUTANEOUS
  Filled 2013-11-16: qty 0.6

## 2013-11-16 NOTE — Progress Notes (Signed)
[place] [facility] OFFICE PROGRESS NOTE  [name], [name] [place] [address]  DIAGNOSIS: Essential thrombocythemia  Anemia due to chronic renal failure treated with erythropoietin  Chief Complaint  Patient presents with  . Anemia due to chronic renal failure treated with erythropoie    CURRENT THERAPY: 1. Aspirin 81 mg by mouth daily. 2. Anagrelide (Agrylin) 1 mg by mouth daily. 3. Aranesp 300 mcg subcu every 2 weeks for hemoglobin less than 11.   INTERVAL HISTORY: [name] 80 y.o. male history of essential thrombocythemia and anemia due to chronic renal failure treated with erythropoietin is her for 2 month follow-up.  He was last seen by [place] on 09/21/2013. He reports doing very well.  He denies recent hospitalizations or recurrent infections or bleeding.  He denies hematochezia or melena.   His weight is stable. He saw neurology for balance problems and had an MRI of brain done which he reports was negative.  He was given medicine (sinemet) for parkinson's disease.   He did also report a fall recently. He notes a change in his balance.  He also reports that the fall was s/p walking around the track 7 times.  He will be seeing neurology (Dr. Krista [name])  in December.  He is not on physical therapy.   He will request physical therapy from her office.   MEDICAL HISTORY: Past Medical History  Diagnosis Date  . Chronic kidney disease   . Thrombocythemia   . Esophageal stricture   . Anemia   . Anemia due to chronic renal failure treated with erythropoietin 03/24/2011  . Retinal vein thrombosis, left   . Coronary artery disease   . Hypertension     INTERIM HISTORY: has CAD; STRICTURE AND STENOSIS OF ESOPHAGUS; ESOPHAGEAL REFLUX; Essential thrombocythemia; Anemia due to chronic renal failure treated with erythropoietin; Retinal vein thrombosis, left; Murmur; CKD (chronic kidney disease); Ischemic cardiomyopathy; Essential hypertension; Abnormal involuntary movement;  and Abnormality of gait on his problem list.    ALLERGIES:  is allergic to norvasc and feraheme.  MEDICATIONS: has a current medication list which includes the following prescription(s): acyclovir, alprazolam, anagrelide, aspirin, atorvastatin, carbidopa-levodopa, clonidine, fluticasone, hydralazine, ketoconazole, metoprolol succinate, pantoprazole, psyllium, tamsulosin, triamcinolone cream, zolpidem, and furosemide.  SURGICAL HISTORY:  Past Surgical History  Procedure Laterality Date  . Coronary artery bypass graft  09/13/04  . Replacement total knee  04/19/08    right  . Inguinal hernia repair  05/08/01    right    REVIEW OF SYSTEMS:   Constitutional: Denies fevers, chills or abnormal weight loss Eyes: Denies blurriness of vision Ears, nose, mouth, throat, and face: Denies mucositis or sore throat Respiratory: Denies cough, dyspnea or wheezes Cardiovascular: Denies palpitation, chest discomfort or lower extremity swelling Gastrointestinal:  Denies nausea, heartburn or change in bowel habits Skin: Denies abnormal skin rashes Lymphatics: Denies new lymphadenopathy or easy bruising Neurological:Denies numbness, tingling or new weaknesses Behavioral/Psych: Mood is stable, no new changes  All other systems were reviewed with the patient and are negative.  PHYSICAL EXAMINATION: ECOG PERFORMANCE STATUS: 0 - Asymptomatic  Blood pressure 124/46, pulse 62, temperature 97.2 F (36.2 C), temperature source Oral, resp. rate 18, height 5\' 10"  (1.778 m), weight 158 lb 7 oz (71.867 kg).  GENERAL:alert, no distress and comfortable, elderly male; moves limbs spontaneously through out exams SKIN: skin color, texture, turgor are normal, no rashes EYES: normal, Conjunctiva are pink and non-injected, sclera clear OROPHARYNX:no exudate, no erythema and lips, buccal mucosa, and tongue  normal  NECK: supple, thyroid normal size, non-tender, without nodularity LYMPH:  no palpable lymphadenopathy in the  cervical, axillary or inguinal LUNGS: clear to auscultation and percussion with normal breathing effort HEART: regular rate & rhythm and no murmurs and no lower extremity edema ABDOMEN:abdomen soft, non-tender and normal bowel sounds Musculoskeletal:no peripheral edema NEURO: alert & oriented x 3 with fluent speech, no focal motor/sensory deficits; shuffling gait noted.    LABORATORY DATA: Results for orders placed in visit on 11/16/13 (from the past 48 hour(s))  CBC WITH DIFFERENTIAL     Status: Abnormal   Collection Time    11/16/13 12:29 PM      Result Value Ref Range   WBC 5.5  4.0 - 10.3 10e3/uL   NEUT# 3.9  1.5 - 6.5 10e3/uL   HGB 9.2 (*) 13.0 - 17.1 g/dL   HCT 28.5 (*) 38.4 - 49.9 %   Platelets 381  140 - 400 10e3/uL   MCV 91.9  79.3 - 98.0 fL   MCH 29.5  27.2 - 33.4 pg   MCHC 32.1  32.0 - 36.0 g/dL   RBC 3.10 (*) 4.20 - 5.82 10e6/uL   RDW 15.6 (*) 11.0 - 14.6 %   lymph# 0.7 (*) 0.9 - 3.3 10e3/uL   MONO# 0.6  0.1 - 0.9 10e3/uL   Eosinophils Absolute 0.3  0.0 - 0.5 10e3/uL   Basophils Absolute 0.1  0.0 - 0.1 10e3/uL   NEUT% 70.1  39.0 - 75.0 %   LYMPH% 13.4 (*) 14.0 - 49.0 %   MONO% 10.6  0.0 - 14.0 %   EOS% 4.7  0.0 - 7.0 %   BASO% 1.2  0.0 - 2.0 %  COMPREHENSIVE METABOLIC PANEL (PO24)     Status: Abnormal   Collection Time    11/16/13 12:29 PM      Result Value Ref Range   Sodium 135 (*) 136 - 145 mEq/L   Potassium 4.5  3.5 - 5.1 mEq/L   Chloride 101  98 - 109 mEq/L   CO2 25  22 - 29 mEq/L   Glucose 122  70 - 140 mg/dl   BUN 68.5 (*) 7.0 - 26.0 mg/dL   Creatinine 2.6 (*) 0.7 - 1.3 mg/dL   Total Bilirubin 0.35  0.20 - 1.20 mg/dL   Alkaline Phosphatase 78  40 - 150 U/L   AST 15  5 - 34 U/L   ALT 16  0 - 55 U/L   Total Protein 5.7 (*) 6.4 - 8.3 g/dL   Albumin 3.1 (*) 3.5 - 5.0 g/dL   Calcium 8.2 (*) 8.4 - 10.4 mg/dL   Anion Gap 9  3 - 11 mEq/L    RADIOGRAPHIC STUDIES: No results found.  ASSESSMENT/PLAN:   1. Essential Thrombocythemia (09/1993).  --[name] continues to do fairly well on the current treatment program.  --Platelet counts have been quite satisfactory usually in the 300-400,000 range.   2. Anemia secondary to chronic renal disease. --Based on hemoglobin of 9.2 . He will receive Aranesp today 300 mcg subcu today. We will change to check CBCs every 2 weeks (from every 3 weeks) and we will give him Aranesp 300 mcg subcu whenever the hemoglobin is less than 11. Erythropoietin level is 33.9.   3. Shuffling gait, episodic movements. --He will follow up with neurology (Dr. Krista [name]) prn and his next scheduled appointment is 05/2014. He was started on sinemet but it did not improve his symptoms. He was counseled on fall precautions  and provided a handout.   4. Follow-up.  --We will plan to see him again in 2 months, at which time we will check CBC, chemistries and iron studies.  All questions were answered. The patient knows to call the clinic with any problems, questions or concerns. We can certainly see the patient much sooner if necessary.  I spent 10 minutes counseling the patient face to face. The total time spent in the appointment was 15 minutes.    Edra Riccardi, MD 11/16/2013 1:24 PM

## 2013-11-16 NOTE — Telephone Encounter (Signed)
gv and printed appt sched and avs for pt for June thru Aug

## 2013-11-25 ENCOUNTER — Encounter: Payer: Self-pay | Admitting: Vascular Surgery

## 2013-11-26 ENCOUNTER — Encounter: Payer: Self-pay | Admitting: Vascular Surgery

## 2013-11-26 ENCOUNTER — Ambulatory Visit (INDEPENDENT_AMBULATORY_CARE_PROVIDER_SITE_OTHER): Payer: Medicare Other | Admitting: Vascular Surgery

## 2013-11-26 ENCOUNTER — Other Ambulatory Visit: Payer: Self-pay

## 2013-11-26 ENCOUNTER — Ambulatory Visit (INDEPENDENT_AMBULATORY_CARE_PROVIDER_SITE_OTHER)
Admission: RE | Admit: 2013-11-26 | Discharge: 2013-11-26 | Disposition: A | Discharge status: Home / Self Care | Payer: Medicare Other | Source: Ambulatory Visit | Attending: Vascular Surgery | Admitting: Vascular Surgery

## 2013-11-26 ENCOUNTER — Ambulatory Visit (HOSPITAL_COMMUNITY)
Admission: RE | Admit: 2013-11-26 | Discharge: 2013-11-26 | Disposition: A | Discharge status: Home / Self Care | Payer: Medicare Other | Source: Ambulatory Visit | Attending: Vascular Surgery | Admitting: Vascular Surgery

## 2013-11-26 VITALS — BP 145/93 | HR 57 | Ht 70.0 in | Wt 159.0 lb

## 2013-11-26 DIAGNOSIS — N184 Chronic kidney disease, stage 4 (severe): Secondary | ICD-10-CM

## 2013-11-26 DIAGNOSIS — Z01810 Encounter for preprocedural cardiovascular examination: Secondary | ICD-10-CM

## 2013-11-26 DIAGNOSIS — N186 End stage renal disease: Secondary | ICD-10-CM

## 2013-11-26 NOTE — Progress Notes (Signed)
[facility] HISTORY AND PHYSICAL   CC:  ESRD in need of permanent access  [name], MD  HPI: This is a 80 y.o. male who states he has had kidney disease for a while.  Last year, his GFR was in the 30's, but last week, it dropped to 23 and dialysis is in the near future.  He states he wants to proceed with this as soon as possible.  He states that he has been having more swelling in his legs.  He is right handed and prefers the access to be in his left hand.  He states that he was taking an aspirin, but has discontinued this in anticipation of his surgery.  Past Medical History  Diagnosis Date  . Chronic kidney disease   . Thrombocythemia   . Esophageal stricture   . Anemia   . Anemia due to chronic renal failure treated with erythropoietin 03/24/2011  . Retinal vein thrombosis, left   . Coronary artery disease   . Hypertension    Past Surgical History  Procedure Laterality Date  . Coronary artery bypass graft  09/13/04  . Replacement total knee  04/19/08    right  . Inguinal hernia repair  05/08/01    right    Allergies  Allergen Reactions  . Norvasc [Amlodipine Besylate] Other (See Comments)    TIA's  . Feraheme [Ferumoxytol] Rash    Current Outpatient Prescriptions  Medication Sig Dispense Refill  . acyclovir (ZOVIRAX) 200 MG capsule Take 1 tablet by mouth Daily.      Marland Kitchen ALPRAZolam (XANAX) 0.25 MG tablet 0.25 mg as needed.      Marland Kitchen anagrelide (AGRYLIN) 1 MG capsule take 1 capsule by mouth daily or as directed  60 capsule  1  . aspirin 81 MG tablet Take 81 mg by mouth daily.        . carbidopa-levodopa (SINEMET IR) 25-100 MG per tablet Take 1 tablet by mouth 3 (three) times daily.  90 tablet  6  . cloNIDine (CATAPRES) 0.3 MG tablet Take 0.3 mg by mouth 3 (three) times daily.        . fluticasone (FLONASE) 50 MCG/ACT nasal spray As directed      . furosemide (LASIX) 40 MG tablet Take 60 mg by mouth daily.       . hydrALAZINE (APRESOLINE)  25 MG tablet Take 1 tablet (25 mg total) by mouth 3 (three) times daily.  90 tablet  6  . ketoconazole (NIZORAL) 2 % cream       . metoprolol succinate (TOPROL-XL) 100 MG 24 hr tablet TAKE 1 TABLET BY MOUTH TWICE A DAY WITH OR IMMEDIATELY AFTER A MEAL  60 tablet  3  . pantoprazole (PROTONIX) 40 MG tablet take 1 tablet by mouth once daily  30 tablet  5  . psyllium (METAMUCIL) 58.6 % powder Take 1 packet by mouth as needed.        . Tamsulosin HCl (FLOMAX) 0.4 MG CAPS Take 0.4 mg by mouth daily after supper.       . triamcinolone cream (KENALOG) 0.1 % as directed.      . zolpidem (AMBIEN) 5 MG tablet Take 1 tablet by mouth as needed.      Marland Kitchen atorvastatin (LIPITOR) 20 MG tablet Take 20 mg by mouth daily.       No current facility-administered medications for this visit.    Family History  Problem Relation Age of Onset  . Dementia Mother   .  Angina Father     History   Social History  . Marital Status: Married    Spouse Name: [name]    Number of Children: 2  . Years of Education: 12   Occupational History  .      retired   Social History Main Topics  . Smoking status: Former Smoker    Quit date: 11/27/1978  . Smokeless tobacco: Former Systems developer    Types: Chew     Comment: quit 30 +yrs ago  . Alcohol Use: No     Comment: Quit 30 + yrs ago  . Drug Use: No  . Sexual Activity: Not on file   Other Topics Concern  . Not on file   Social History Narrative   Patient lives at home with wife [name]) and son.   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   Right handed   Caffeine two cups of coffee daily     ROS: [x]  Positive   [ ]  Negative   [ ]  All sytems reviewed and are negative  Cardiovascular: []  chest pain/pressure []  palpitations []  SOB lying flat []  DOE []  pain in legs while walking []  pain in feet when lying flat []  hx of DVT []  hx of phlebitis [x]  swelling in legs [x]  varicose veins  Pulmonary: []  productive cough []  asthma []  wheezing  Neurologic: []  weakness  in []  arms []  legs []  numbness in []  arms []  legs [] difficulty speaking or slurred speech []  temporary loss of vision in one eye []  dizziness  Hematologic: []  bleeding problems []  problems with blood clotting easily  GI []  vomiting blood []  blood in stool  GU: [x]  CKD/renal failure  []  HD---[]  M/W/F []  T/T/F []  burning with urination []  blood in urine  Psychiatric: []  hx of major depression  Integumentary: []  rashes []  ulcers  Constitutional: []  fever []  chills   PHYSICAL EXAMINATION:  Filed Vitals:   11/26/13 1607  BP: 145/93  Pulse: 57   Body mass index is 22.81 kg/(m^2).  General:  WDWN in NAD Gait: Normal HENT: WNL Eyes: PERRL Pulmonary: normal non-labored breathing , without Rales, rhonchi,  wheezing Cardiac: RRR, with 2/6 systolic  Murmurs, rubs or gallops;  without carotid bruits Abdomen: soft, NT, no masses Skin: without rashes, without ulcers  Vascular Exam/Pulses:  2+ bilateral radial pulses 2+ right brachial artery pulse Extremities:  without ischemic changes, without Gangrene, without cellulitis; without open wounds;  Musculoskeletal: no muscle wasting or atrophy  Neurologic: A&O X 3; Appropriate Affect ; SENSATION: normal; MOTOR FUNCTION:  moving all extremities equally. Speech is fluent/normal  Non-Invasive Vascular Imaging:   Upper extremity arterial duplex evaluation 11/26/13 -patent arteries of the bilateral upper extremity with evidence of arterial calcific disease of the ulnar arteries, right worse than left  Upper extremity Vein Mapping:  9/89/21 Right cephalic vein ranges from .19-.41 cm Left cephalic vein ranges from .74-.08 cm  Right basilic vein ranges from .14-.48 cm Left basilic vein ranges from .33-.6 cm   ASSESSMENT: 80 y.o. male in need of permanent HD access  PLAN:  -pt is right handed and has a nice left cephalic vein at the wrist.  Will proceed with left radio-cephalic fistula on 1/85/63   Leontine Locket,  PA-C [facility] [phone]  Clinic MD:  Pt seen and examined in conjunction with Dr. Oneida Alar   History exam details as above. Patient has a good cephalic vein and radial artery for radiocephalic AV fistula. Risks benefits possible palpitations and  procedure details including but limited to bleeding infection non-maturation of fistula were trying to the patient today. He understands and agrees to proceed.  Ruta Hinds, MD [facility] [place] Office: [phone] Pager: [phone]

## 2013-11-27 ENCOUNTER — Encounter (HOSPITAL_COMMUNITY): Payer: Self-pay | Admitting: *Deleted

## 2013-11-27 ENCOUNTER — Encounter (HOSPITAL_COMMUNITY): Payer: Self-pay

## 2013-11-30 ENCOUNTER — Other Ambulatory Visit: Payer: Self-pay

## 2013-11-30 ENCOUNTER — Ambulatory Visit (HOSPITAL_BASED_OUTPATIENT_CLINIC_OR_DEPARTMENT_OTHER): Payer: Medicare Other

## 2013-11-30 ENCOUNTER — Other Ambulatory Visit (HOSPITAL_BASED_OUTPATIENT_CLINIC_OR_DEPARTMENT_OTHER): Payer: Medicare Other

## 2013-11-30 VITALS — BP 142/67 | HR 58 | Temp 97.9°F

## 2013-11-30 DIAGNOSIS — N186 End stage renal disease: Secondary | ICD-10-CM

## 2013-11-30 DIAGNOSIS — N039 Chronic nephritic syndrome with unspecified morphologic changes: Secondary | ICD-10-CM

## 2013-11-30 DIAGNOSIS — D473 Essential (hemorrhagic) thrombocythemia: Secondary | ICD-10-CM

## 2013-11-30 DIAGNOSIS — N189 Chronic kidney disease, unspecified: Secondary | ICD-10-CM

## 2013-11-30 DIAGNOSIS — D631 Anemia in chronic kidney disease: Secondary | ICD-10-CM

## 2013-11-30 LAB — CBC WITH DIFFERENTIAL/PLATELET
BASO%: 1.4 % (ref 0.0–2.0)
Basophils Absolute: 0.1 10*3/uL (ref 0.0–0.1)
EOS%: 4.7 % (ref 0.0–7.0)
Eosinophils Absolute: 0.2 10*3/uL (ref 0.0–0.5)
HCT: 27.6 % — ABNORMAL LOW (ref 38.4–49.9)
HGB: 8.8 g/dL — ABNORMAL LOW (ref 13.0–17.1)
LYMPH%: 18.4 % (ref 14.0–49.0)
MCH: 28.6 pg (ref 27.2–33.4)
MCHC: 31.8 g/dL — ABNORMAL LOW (ref 32.0–36.0)
MCV: 90.2 fL (ref 79.3–98.0)
MONO#: 0.5 10*3/uL (ref 0.1–0.9)
MONO%: 10.4 % (ref 0.0–14.0)
NEUT#: 2.9 10*3/uL (ref 1.5–6.5)
NEUT%: 65.1 % (ref 39.0–75.0)
Platelets: 349 10*3/uL (ref 140–400)
RBC: 3.07 10*6/uL — ABNORMAL LOW (ref 4.20–5.82)
RDW: 15.8 % — ABNORMAL HIGH (ref 11.0–14.6)
WBC: 4.5 10*3/uL (ref 4.0–10.3)
lymph#: 0.8 10*3/uL — ABNORMAL LOW (ref 0.9–3.3)

## 2013-11-30 MED ORDER — DEXTROSE 5 % IV SOLN
1.5000 g | INTRAVENOUS | Status: AC
  Administered 2013-12-01: 1.5 g via INTRAVENOUS
  Filled 2013-11-30: qty 1.5

## 2013-11-30 MED ORDER — SODIUM CHLORIDE 0.9 % IV SOLN: INTRAVENOUS | Status: DC

## 2013-11-30 MED ORDER — DARBEPOETIN ALFA-POLYSORBATE 300 MCG/0.6ML IJ SOLN
300.0000 ug | Freq: Once | INTRAMUSCULAR | Status: AC
  Administered 2013-11-30: 300 ug via SUBCUTANEOUS
  Filled 2013-11-30: qty 0.6

## 2013-11-30 NOTE — Progress Notes (Signed)
Called Dr. Oneida Alar office this am and spoke with Colletta Maryland about consent order. When talking with pt on Friday, 11/27/13 for pre-op call, he stated that the surgery was supposed to be on his left arm, not right. The consent order states right. Colletta Maryland states she will get this changed to say left.

## 2013-12-01 ENCOUNTER — Ambulatory Visit (HOSPITAL_COMMUNITY)
Admission: RE | Admit: 2013-12-01 | Discharge: 2013-12-01 | Disposition: A | Discharge status: Home / Self Care | Payer: Medicare Other | Source: Ambulatory Visit | Attending: Vascular Surgery | Admitting: Vascular Surgery

## 2013-12-01 ENCOUNTER — Encounter (HOSPITAL_COMMUNITY): Payer: Medicare Other | Admitting: Certified Registered"

## 2013-12-01 ENCOUNTER — Telehealth: Payer: Self-pay | Admitting: Internal Medicine

## 2013-12-01 ENCOUNTER — Other Ambulatory Visit: Payer: Self-pay | Admitting: *Deleted

## 2013-12-01 ENCOUNTER — Other Ambulatory Visit: Payer: Self-pay | Admitting: Medical Oncology

## 2013-12-01 ENCOUNTER — Ambulatory Visit (HOSPITAL_COMMUNITY): Payer: Medicare Other | Admitting: Certified Registered"

## 2013-12-01 ENCOUNTER — Encounter (HOSPITAL_COMMUNITY)
Admission: RE | Disposition: A | Discharge status: Home / Self Care | Payer: Self-pay | Source: Ambulatory Visit | Attending: Vascular Surgery

## 2013-12-01 DIAGNOSIS — K219 Gastro-esophageal reflux disease without esophagitis: Secondary | ICD-10-CM | POA: Insufficient documentation

## 2013-12-01 DIAGNOSIS — Z7982 Long term (current) use of aspirin: Secondary | ICD-10-CM | POA: Insufficient documentation

## 2013-12-01 DIAGNOSIS — I2510 Atherosclerotic heart disease of native coronary artery without angina pectoris: Secondary | ICD-10-CM | POA: Insufficient documentation

## 2013-12-01 DIAGNOSIS — R011 Cardiac murmur, unspecified: Secondary | ICD-10-CM | POA: Insufficient documentation

## 2013-12-01 DIAGNOSIS — Z4931 Encounter for adequacy testing for hemodialysis: Secondary | ICD-10-CM

## 2013-12-01 DIAGNOSIS — N185 Chronic kidney disease, stage 5: Secondary | ICD-10-CM

## 2013-12-01 DIAGNOSIS — I739 Peripheral vascular disease, unspecified: Secondary | ICD-10-CM | POA: Insufficient documentation

## 2013-12-01 DIAGNOSIS — N186 End stage renal disease: Secondary | ICD-10-CM

## 2013-12-01 DIAGNOSIS — I120 Hypertensive chronic kidney disease with stage 5 chronic kidney disease or end stage renal disease: Secondary | ICD-10-CM | POA: Insufficient documentation

## 2013-12-01 DIAGNOSIS — Z87891 Personal history of nicotine dependence: Secondary | ICD-10-CM | POA: Insufficient documentation

## 2013-12-01 DIAGNOSIS — D649 Anemia, unspecified: Secondary | ICD-10-CM | POA: Insufficient documentation

## 2013-12-01 HISTORY — DX: Gastro-esophageal reflux disease without esophagitis: K21.9

## 2013-12-01 HISTORY — PX: AV FISTULA PLACEMENT: SHX1204

## 2013-12-01 HISTORY — DX: Unspecified osteoarthritis, unspecified site: M19.90

## 2013-12-01 LAB — POCT I-STAT 4, (NA,K, GLUC, HGB,HCT)
Glucose, Bld: 101 mg/dL — ABNORMAL HIGH (ref 70–99)
HEMATOCRIT: 30 % — AB (ref 39.0–52.0)
HEMOGLOBIN: 10.2 g/dL — AB (ref 13.0–17.0)
POTASSIUM: 4.7 meq/L (ref 3.7–5.3)
Sodium: 134 mEq/L — ABNORMAL LOW (ref 137–147)

## 2013-12-01 SURGERY — ARTERIOVENOUS (AV) FISTULA CREATION
Anesthesia: Monitor Anesthesia Care | Site: Arm Lower | Laterality: Left

## 2013-12-01 MED ORDER — HEPARIN SODIUM (PORCINE) 1000 UNIT/ML IJ SOLN
INTRAMUSCULAR | Status: DC | PRN
  Administered 2013-12-01: 5000 [IU] via INTRAVENOUS

## 2013-12-01 MED ORDER — HYDROMORPHONE HCL PF 1 MG/ML IJ SOLN: 0.2500 mg | INTRAMUSCULAR | Status: DC | PRN

## 2013-12-01 MED ORDER — 0.9 % SODIUM CHLORIDE (POUR BTL) OPTIME
TOPICAL | Status: DC | PRN
  Administered 2013-12-01: 1000 mL

## 2013-12-01 MED ORDER — PROPOFOL 10 MG/ML IV BOLUS
INTRAVENOUS | Status: AC
  Filled 2013-12-01: qty 20

## 2013-12-01 MED ORDER — FENTANYL CITRATE 0.05 MG/ML IJ SOLN
INTRAMUSCULAR | Status: AC
  Filled 2013-12-01: qty 5

## 2013-12-01 MED ORDER — FENTANYL CITRATE 0.05 MG/ML IJ SOLN
INTRAMUSCULAR | Status: DC | PRN
  Administered 2013-12-01 (×2): 50 ug via INTRAVENOUS

## 2013-12-01 MED ORDER — OXYCODONE HCL 5 MG/5ML PO SOLN: 5.0000 mg | Freq: Once | ORAL | Status: DC | PRN

## 2013-12-01 MED ORDER — LIDOCAINE HCL (PF) 1 % IJ SOLN
INTRAMUSCULAR | Status: AC
  Filled 2013-12-01: qty 30

## 2013-12-01 MED ORDER — LIDOCAINE HCL (PF) 1 % IJ SOLN
INTRAMUSCULAR | Status: DC | PRN
  Administered 2013-12-01: 7 mL

## 2013-12-01 MED ORDER — PROMETHAZINE HCL 25 MG/ML IJ SOLN: 6.2500 mg | INTRAMUSCULAR | Status: DC | PRN

## 2013-12-01 MED ORDER — CHLORHEXIDINE GLUCONATE CLOTH 2 % EX PADS: 6.0000 | MEDICATED_PAD | Freq: Once | CUTANEOUS | Status: DC

## 2013-12-01 MED ORDER — OXYCODONE HCL 5 MG PO TABS: 5.0000 mg | ORAL_TABLET | Freq: Four times a day (QID) | ORAL | Status: DC | PRN

## 2013-12-01 MED ORDER — OXYCODONE HCL 5 MG PO TABS: 5.0000 mg | ORAL_TABLET | Freq: Once | ORAL | Status: DC | PRN

## 2013-12-01 MED ORDER — PROTAMINE SULFATE 10 MG/ML IV SOLN
INTRAVENOUS | Status: DC | PRN
  Administered 2013-12-01 (×5): 10 mg via INTRAVENOUS

## 2013-12-01 MED ORDER — ONDANSETRON HCL 4 MG/2ML IJ SOLN
INTRAMUSCULAR | Status: AC
  Filled 2013-12-01: qty 2

## 2013-12-01 MED ORDER — THROMBIN 20000 UNITS EX SOLR
CUTANEOUS | Status: AC
  Filled 2013-12-01: qty 20000

## 2013-12-01 MED ORDER — SODIUM CHLORIDE 0.9 % IV SOLN
INTRAVENOUS | Status: DC
  Administered 2013-12-01 (×2): via INTRAVENOUS

## 2013-12-01 MED ORDER — LIDOCAINE HCL (CARDIAC) 20 MG/ML IV SOLN
INTRAVENOUS | Status: AC
  Filled 2013-12-01: qty 5

## 2013-12-01 MED ORDER — PROTAMINE SULFATE 10 MG/ML IV SOLN
INTRAVENOUS | Status: AC
  Filled 2013-12-01: qty 5

## 2013-12-01 MED ORDER — HEPARIN SODIUM (PORCINE) 5000 UNIT/ML IJ SOLN
INTRAMUSCULAR | Status: DC | PRN
  Administered 2013-12-01: 14:00:00

## 2013-12-01 MED ORDER — PROPOFOL INFUSION 10 MG/ML OPTIME
INTRAVENOUS | Status: DC | PRN
  Administered 2013-12-01: 50 ug/kg/min via INTRAVENOUS

## 2013-12-01 MED ORDER — LIDOCAINE HCL (CARDIAC) 20 MG/ML IV SOLN
INTRAVENOUS | Status: DC | PRN
  Administered 2013-12-01: 40 mg via INTRAVENOUS

## 2013-12-01 SURGICAL SUPPLY — 41 items
ADH SKN CLS APL DERMABOND .7 (GAUZE/BANDAGES/DRESSINGS) ×1
ARMBAND PINK RESTRICT EXTREMIT (MISCELLANEOUS) ×2 IMPLANT
BLADE 10 SAFETY STRL DISP (BLADE) ×1 IMPLANT
CANISTER SUCTION 2500CC (MISCELLANEOUS) ×2 IMPLANT
CLIP TI MEDIUM 6 (CLIP) ×2 IMPLANT
CLIP TI WIDE RED SMALL 6 (CLIP) ×3 IMPLANT
COVER PROBE W GEL 5X96 (DRAPES) ×1 IMPLANT
COVER SURGICAL LIGHT HANDLE (MISCELLANEOUS) ×2 IMPLANT
DECANTER SPIKE VIAL GLASS SM (MISCELLANEOUS) ×1 IMPLANT
DERMABOND ADVANCED (GAUZE/BANDAGES/DRESSINGS) ×1
DERMABOND ADVANCED .7 DNX12 (GAUZE/BANDAGES/DRESSINGS) ×1 IMPLANT
DRAIN PENROSE 1/4X12 LTX STRL (WOUND CARE) ×1 IMPLANT
ELECT REM PT RETURN 9FT ADLT (ELECTROSURGICAL) ×2
ELECTRODE REM PT RTRN 9FT ADLT (ELECTROSURGICAL) ×1 IMPLANT
GEL ULTRASOUND 20GR AQUASONIC (MISCELLANEOUS) IMPLANT
GLOVE BIO SURGEON STRL SZ 6.5 (GLOVE) ×3 IMPLANT
GLOVE BIO SURGEON STRL SZ7.5 (GLOVE) ×2 IMPLANT
GLOVE BIOGEL PI IND STRL 6.5 (GLOVE) IMPLANT
GLOVE BIOGEL PI IND STRL 7.0 (GLOVE) IMPLANT
GLOVE BIOGEL PI INDICATOR 6.5 (GLOVE) ×2
GLOVE BIOGEL PI INDICATOR 7.0 (GLOVE) ×1
GLOVE SURG SS PI 7.5 STRL IVOR (GLOVE) ×1 IMPLANT
GOWN L4 XLG 20 PK N/S (GOWN DISPOSABLE) ×2 IMPLANT
GOWN STRL REUS W/ TWL LRG LVL3 (GOWN DISPOSABLE) ×3 IMPLANT
GOWN STRL REUS W/TWL LRG LVL3 (GOWN DISPOSABLE) ×6
KIT BASIN OR (CUSTOM PROCEDURE TRAY) ×2 IMPLANT
KIT ROOM TURNOVER OR (KITS) ×2 IMPLANT
LOOP VESSEL MINI RED (MISCELLANEOUS) IMPLANT
NS IRRIG 1000ML POUR BTL (IV SOLUTION) ×2 IMPLANT
PACK CV ACCESS (CUSTOM PROCEDURE TRAY) ×2 IMPLANT
PAD ARMBOARD 7.5X6 YLW CONV (MISCELLANEOUS) ×4 IMPLANT
SPONGE SURGIFOAM ABS GEL 100 (HEMOSTASIS) IMPLANT
SUT PROLENE 6 0 CC (SUTURE) ×2 IMPLANT
SUT PROLENE 7 0 BV 1 (SUTURE) ×1 IMPLANT
SUT VIC AB 3-0 SH 27 (SUTURE) ×2
SUT VIC AB 3-0 SH 27X BRD (SUTURE) ×1 IMPLANT
SUT VICRYL 4-0 PS2 18IN ABS (SUTURE) ×2 IMPLANT
TOWEL OR 17X24 6PK STRL BLUE (TOWEL DISPOSABLE) ×3 IMPLANT
TOWEL OR 17X26 10 PK STRL BLUE (TOWEL DISPOSABLE) ×2 IMPLANT
UNDERPAD 30X30 INCONTINENT (UNDERPADS AND DIAPERS) ×2 IMPLANT
WATER STERILE IRR 1000ML POUR (IV SOLUTION) ×2 IMPLANT

## 2013-12-01 NOTE — H&P (View-Only) (Signed)
[facility] HISTORY AND PHYSICAL   CC:  ESRD in need of permanent access  [name], MD  HPI: This is a 80 y.o. male who states he has had kidney disease for a while.  Last year, his GFR was in the 30's, but last week, it dropped to 23 and dialysis is in the near future.  He states he wants to proceed with this as soon as possible.  He states that he has been having more swelling in his legs.  He is right handed and prefers the access to be in his left hand.  He states that he was taking an aspirin, but has discontinued this in anticipation of his surgery.  Past Medical History  Diagnosis Date  . Chronic kidney disease   . Thrombocythemia   . Esophageal stricture   . Anemia   . Anemia due to chronic renal failure treated with erythropoietin 03/24/2011  . Retinal vein thrombosis, left   . Coronary artery disease   . Hypertension    Past Surgical History  Procedure Laterality Date  . Coronary artery bypass graft  09/13/04  . Replacement total knee  04/19/08    right  . Inguinal hernia repair  05/08/01    right    Allergies  Allergen Reactions  . Norvasc [Amlodipine Besylate] Other (See Comments)    TIA's  . Feraheme [Ferumoxytol] Rash    Current Outpatient Prescriptions  Medication Sig Dispense Refill  . acyclovir (ZOVIRAX) 200 MG capsule Take 1 tablet by mouth Daily.      Marland Kitchen ALPRAZolam (XANAX) 0.25 MG tablet 0.25 mg as needed.      Marland Kitchen anagrelide (AGRYLIN) 1 MG capsule take 1 capsule by mouth daily or as directed  60 capsule  1  . aspirin 81 MG tablet Take 81 mg by mouth daily.        . carbidopa-levodopa (SINEMET IR) 25-100 MG per tablet Take 1 tablet by mouth 3 (three) times daily.  90 tablet  6  . cloNIDine (CATAPRES) 0.3 MG tablet Take 0.3 mg by mouth 3 (three) times daily.        . fluticasone (FLONASE) 50 MCG/ACT nasal spray As directed      . furosemide (LASIX) 40 MG tablet Take 60 mg by mouth daily.       . hydrALAZINE (APRESOLINE)  25 MG tablet Take 1 tablet (25 mg total) by mouth 3 (three) times daily.  90 tablet  6  . ketoconazole (NIZORAL) 2 % cream       . metoprolol succinate (TOPROL-XL) 100 MG 24 hr tablet TAKE 1 TABLET BY MOUTH TWICE A DAY WITH OR IMMEDIATELY AFTER A MEAL  60 tablet  3  . pantoprazole (PROTONIX) 40 MG tablet take 1 tablet by mouth once daily  30 tablet  5  . psyllium (METAMUCIL) 58.6 % powder Take 1 packet by mouth as needed.        . Tamsulosin HCl (FLOMAX) 0.4 MG CAPS Take 0.4 mg by mouth daily after supper.       . triamcinolone cream (KENALOG) 0.1 % as directed.      . zolpidem (AMBIEN) 5 MG tablet Take 1 tablet by mouth as needed.      Marland Kitchen atorvastatin (LIPITOR) 20 MG tablet Take 20 mg by mouth daily.       No current facility-administered medications for this visit.    Family History  Problem Relation Age of Onset  . Dementia Mother   .  Angina Father     History   Social History  . Marital Status: Married    Spouse Name: [name]    Number of Children: 2  . Years of Education: 12   Occupational History  .      retired   Social History Main Topics  . Smoking status: Former Smoker    Quit date: 11/27/1978  . Smokeless tobacco: Former Systems developer    Types: Chew     Comment: quit 30 +yrs ago  . Alcohol Use: No     Comment: Quit 30 + yrs ago  . Drug Use: No  . Sexual Activity: Not on file   Other Topics Concern  . Not on file   Social History Narrative   Patient lives at home with wife [name]) and son.   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   Right handed   Caffeine two cups of coffee daily     ROS: [x]  Positive   [ ]  Negative   [ ]  All sytems reviewed and are negative  Cardiovascular: []  chest pain/pressure []  palpitations []  SOB lying flat []  DOE []  pain in legs while walking []  pain in feet when lying flat []  hx of DVT []  hx of phlebitis [x]  swelling in legs [x]  varicose veins  Pulmonary: []  productive cough []  asthma []  wheezing  Neurologic: []  weakness  in []  arms []  legs []  numbness in []  arms []  legs [] difficulty speaking or slurred speech []  temporary loss of vision in one eye []  dizziness  Hematologic: []  bleeding problems []  problems with blood clotting easily  GI []  vomiting blood []  blood in stool  GU: [x]  CKD/renal failure  []  HD---[]  M/W/F []  T/T/F []  burning with urination []  blood in urine  Psychiatric: []  hx of major depression  Integumentary: []  rashes []  ulcers  Constitutional: []  fever []  chills   PHYSICAL EXAMINATION:  Filed Vitals:   11/26/13 1607  BP: 145/93  Pulse: 57   Body mass index is 22.81 kg/(m^2).  General:  WDWN in NAD Gait: Normal HENT: WNL Eyes: PERRL Pulmonary: normal non-labored breathing , without Rales, rhonchi,  wheezing Cardiac: RRR, with 2/6 systolic  Murmurs, rubs or gallops;  without carotid bruits Abdomen: soft, NT, no masses Skin: without rashes, without ulcers  Vascular Exam/Pulses:  2+ bilateral radial pulses 2+ right brachial artery pulse Extremities:  without ischemic changes, without Gangrene, without cellulitis; without open wounds;  Musculoskeletal: no muscle wasting or atrophy  Neurologic: A&O X 3; Appropriate Affect ; SENSATION: normal; MOTOR FUNCTION:  moving all extremities equally. Speech is fluent/normal  Non-Invasive Vascular Imaging:   Upper extremity arterial duplex evaluation 11/26/13 -patent arteries of the bilateral upper extremity with evidence of arterial calcific disease of the ulnar arteries, right worse than left  Upper extremity Vein Mapping:  08/10/63 Right cephalic vein ranges from .78-.46 cm Left cephalic vein ranges from .96-.29 cm  Right basilic vein ranges from .52-.84 cm Left basilic vein ranges from .33-.6 cm   ASSESSMENT: 80 y.o. male in need of permanent HD access  PLAN:  -pt is right handed and has a nice left cephalic vein at the wrist.  Will proceed with left radio-cephalic fistula on 1/32/44   Leontine Locket,  PA-C [facility] [phone]  Clinic MD:  Pt seen and examined in conjunction with Dr. Oneida Alar   History exam details as above. Patient has a good cephalic vein and radial artery for radiocephalic AV fistula. Risks benefits possible palpitations and  procedure details including but limited to bleeding infection non-maturation of fistula were trying to the patient today. He understands and agrees to proceed.  Ruta Hinds, MD [facility] [place] Office: [phone] Pager: [phone]

## 2013-12-01 NOTE — Op Note (Signed)
Procedure: Left Radial Cephalic AV fistula   Preop: ESRD   Postop: ESRD   Anesthesia: MAC with local   Assistant:  Leontine Locket, PA-c   Findings: 3 mm cephalic vein       3 mm radial artery   Procedure Details:  The left upper extremity was prepped and draped in usual sterile fashion. Local anesthesia was infiltrated midway between the cephalic and radial artery anatomically.  A longitudinal skin incision was then made in this location at the distal left forearm. The incision was carried into the subcutaneous tissues down to level cephalic vein. The vein had some spasm but was overall reasonable quality accepting a 3 mm dilator. The vein was dissected free circumferentially and small side branches ligated and divided between silk ties. The distal end was ligated and the vein probed and found to accept up to a 3 mm dilator. This was gently distended with heparinized saline, spatulated, and marked for orientation. Next the radial artery was dissected free in the medial portion incision. The artery was 3 mm in diameter and had a good quality pulse. The vessel loops were placed proximal and distal to the planned site of arteriotomy. The patient was given 5000 units of intravenous heparin. After appropriate circulation time, the vessel loops were used to control the artery. A longitudinal opening was made in the left radial artery. The vein was controlled proximally with a fine bulldog clamp. The vein was then swung over to the artery and sewn end of vein to side of artery using a running 7-0 Prolene suture. Just prior to completion, the anastomosis was fore bled back bled and thoroughly flushed. The anastomosis was secured, vessel loops released, and there was a palpable thrill in the fistula immediately. After hemostasis was obtained, the subcutaneous tissues were reapproximated using a running 3-0 Vicryl suture. The skin was then closed with a 4 Vicryl subcuticular stitch. Dermabond was applied to the  skin incision. The patient tolerated the procedure well and there were no complications. Instrument sponge and needle count were correct at the end of the case. The patient was taken to PACU in stable condition.   Ruta Hinds, MD  [facility]  Office: [phone]  Pager: [phone]

## 2013-12-01 NOTE — Interval H&P Note (Signed)
History and Physical Interval Note:  12/01/2013 10:15 AM  [name]  has presented today for surgery, with the diagnosis of End stage renal disease  The various methods of treatment have been discussed with the patient and family. After consideration of risks, benefits and other options for treatment, the patient has consented to  Procedure(s): RADIOCEPHALIC ARTERIOVENOUS (AV) FISTULA CREATION (Right) as a surgical intervention .  The patient's history has been reviewed, patient examined, no change in status, stable for surgery.  I have reviewed the patient's chart and labs.  Questions were answered to the patient's satisfaction.     FIELDS,CHARLES E

## 2013-12-01 NOTE — Anesthesia Procedure Notes (Signed)
Procedure Name: MAC Date/Time: 12/01/2013 2:11 PM Performed by: Barrington Ellison Pre-anesthesia Checklist: Patient identified, Emergency Drugs available, Suction available and Patient being monitored Patient Re-evaluated:Patient Re-evaluated prior to inductionOxygen Delivery Method: Simple face mask Intubation Type: IV induction Dental Injury: Teeth and Oropharynx as per pre-operative assessment

## 2013-12-01 NOTE — Anesthesia Preprocedure Evaluation (Addendum)
Anesthesia Evaluation  Patient identified by MRN, date of birth, ID band Patient awake    Reviewed: Allergy & Precautions, H&P , NPO status , Patient's Chart, lab work & pertinent test results  Airway Mallampati: I  Neck ROM: Full    Dental  (+) Edentulous Upper, Partial Lower, Dental Advisory Given   Pulmonary former smoker,  breath sounds clear to auscultation        Cardiovascular hypertension, + CAD, + Peripheral Vascular Disease and +CHF + Valvular Problems/Murmurs Rhythm:Regular Rate:Normal + Systolic murmurs    Neuro/Psych    GI/Hepatic GERD-  ,  Endo/Other    Renal/GU ESRFRenal disease     Musculoskeletal   Abdominal   Peds  Hematology  (+) anemia ,   Anesthesia Other Findings   Reproductive/Obstetrics                          Anesthesia Physical Anesthesia Plan  ASA: III  Anesthesia Plan: MAC   Post-op Pain Management:    Induction: Intravenous  Airway Management Planned: Natural Airway and Simple Face Mask  Additional Equipment:   Intra-op Plan:   Post-operative Plan:   Informed Consent: I have reviewed the patients History and Physical, chart, labs and discussed the procedure including the risks, benefits and alternatives for the proposed anesthesia with the patient or authorized representative who has indicated his/her understanding and acceptance.     Plan Discussed with:   Anesthesia Plan Comments:         Anesthesia Quick Evaluation

## 2013-12-01 NOTE — Anesthesia Postprocedure Evaluation (Signed)
  Anesthesia Post-op Note  Patient: [name]  Procedure(s) Performed: Procedure(s): RADIOCEPHALIC ARTERIOVENOUS (AV) FISTULA CREATION (Left)  Patient Location: PACU  Anesthesia Type:MAC  Level of Consciousness: awake and sedated  Airway and Oxygen Therapy: Patient Spontanous Breathing  Post-op Pain: none  Post-op Assessment: Post-op Vital signs reviewed and Patient's Cardiovascular Status Stable  Post-op Vital Signs: stable  Last Vitals:  Filed Vitals:   12/01/13 1600  BP: 187/66  Pulse: 30  Temp:   Resp: 18    Complications: No apparent anesthesia complications

## 2013-12-01 NOTE — Telephone Encounter (Signed)
, °

## 2013-12-01 NOTE — H&P (Signed)
Correction of prior note left arm AV fistula today  Ruta Hinds MD  Ruta Hinds, MD [facility] Office: [phone] Pager: [phone]

## 2013-12-01 NOTE — Discharge Instructions (Signed)
° ° °  12/01/2013 [name] [id number] 04/18/1932  Surgeon(s): Elam Dutch, MD  Procedure(s): RADIOCEPHALIC ARTERIOVENOUS (AV) FISTULA CREATION  x Do not stick graft for 12 weeks   What to eat:  For your first meals, you should eat lightly; only small meals initially.  If you do not have nausea, you may eat larger meals.  Avoid spicy, greasy and heavy food.    General Anesthesia, Adult, Care After  Refer to this sheet in the next few weeks. These instructions provide you with information on caring for yourself after your procedure. Your health care provider may also give you more specific instructions. Your treatment has been planned according to current medical practices, but problems sometimes occur. Call your health care provider if you have any problems or questions after your procedure.  WHAT TO EXPECT AFTER THE PROCEDURE  After the procedure, it is typical to experience:  Sleepiness.  Nausea and vomiting. HOME CARE INSTRUCTIONS  For the first 24 hours after general anesthesia:  Have a responsible person with you.  Do not drive a car. If you are alone, do not take public transportation.  Do not drink alcohol.  Do not take medicine that has not been prescribed by your health care provider.  Do not sign important papers or make important decisions.  You may resume a normal diet and activities as directed by your health care provider.  Change bandages (dressings) as directed.  If you have questions or problems that seem related to general anesthesia, call the hospital and ask for the anesthetist or anesthesiologist on call. SEEK MEDICAL CARE IF:  You have nausea and vomiting that continue the day after anesthesia.  You develop a rash. SEEK IMMEDIATE MEDICAL CARE IF:  You have difficulty breathing.  You have chest pain.  You have any allergic problems. Document Released: 08/27/2000 Document Revised: 01/21/2013 Document Reviewed: 12/04/2012  [facility] Patient Information  2014 [place], [place].

## 2013-12-01 NOTE — Transfer of Care (Signed)
Immediate Anesthesia Transfer of Care Note  Patient: [name]  Procedure(s) Performed: Procedure(s): RADIOCEPHALIC ARTERIOVENOUS (AV) FISTULA CREATION (Left)  Patient Location: PACU  Anesthesia Type:MAC  Level of Consciousness: awake, alert  and oriented  Airway & Oxygen Therapy: Patient Spontanous Breathing and Patient connected to nasal cannula oxygen  Post-op Assessment: Report given to PACU RN  Post vital signs: Reviewed and stable  Complications: No apparent anesthesia complications

## 2013-12-02 ENCOUNTER — Telehealth: Payer: Self-pay | Admitting: Vascular Surgery

## 2013-12-02 ENCOUNTER — Encounter (HOSPITAL_COMMUNITY): Payer: Self-pay | Admitting: Vascular Surgery

## 2013-12-02 NOTE — Telephone Encounter (Addendum)
Message copied by Gena Fray on Wed Dec 02, 2013 11:18 AM ------      Message from: Mena Goes      Created: Tue Dec 01, 2013  3:58 PM      Regarding: Schedule                   ----- Message -----         From: Gabriel Earing, PA-C         Sent: 12/01/2013   3:22 PM           To: [facility] Charge Pool            S/p left radio-cephalic AVF 4/65/03.  F/u with Dr. Oneida Alar in 6 weeks with duplex.            Thanks ------  12/02/13: spoke with pt to schedule, dpm

## 2013-12-06 ENCOUNTER — Other Ambulatory Visit: Payer: Self-pay | Admitting: Internal Medicine

## 2013-12-14 ENCOUNTER — Other Ambulatory Visit: Payer: Self-pay | Admitting: Internal Medicine

## 2013-12-14 ENCOUNTER — Ambulatory Visit (HOSPITAL_BASED_OUTPATIENT_CLINIC_OR_DEPARTMENT_OTHER): Payer: Medicare Other

## 2013-12-14 ENCOUNTER — Ambulatory Visit (HOSPITAL_COMMUNITY)
Admission: RE | Admit: 2013-12-14 | Discharge: 2013-12-14 | Disposition: A | Discharge status: Home / Self Care | Payer: Medicare Other | Source: Ambulatory Visit | Attending: Internal Medicine | Admitting: Internal Medicine

## 2013-12-14 ENCOUNTER — Other Ambulatory Visit (HOSPITAL_BASED_OUTPATIENT_CLINIC_OR_DEPARTMENT_OTHER): Payer: Medicare Other

## 2013-12-14 VITALS — BP 140/87 | HR 60

## 2013-12-14 DIAGNOSIS — N189 Chronic kidney disease, unspecified: Secondary | ICD-10-CM

## 2013-12-14 DIAGNOSIS — D649 Anemia, unspecified: Secondary | ICD-10-CM | POA: Insufficient documentation

## 2013-12-14 DIAGNOSIS — D631 Anemia in chronic kidney disease: Secondary | ICD-10-CM | POA: Diagnosis present

## 2013-12-14 DIAGNOSIS — N184 Chronic kidney disease, stage 4 (severe): Principal | ICD-10-CM

## 2013-12-14 DIAGNOSIS — D638 Anemia in other chronic diseases classified elsewhere: Secondary | ICD-10-CM

## 2013-12-14 DIAGNOSIS — N039 Chronic nephritic syndrome with unspecified morphologic changes: Principal | ICD-10-CM

## 2013-12-14 DIAGNOSIS — D473 Essential (hemorrhagic) thrombocythemia: Secondary | ICD-10-CM

## 2013-12-14 LAB — CBC WITH DIFFERENTIAL/PLATELET
BASO%: 0.7 % (ref 0.0–2.0)
BASOS ABS: 0 10*3/uL (ref 0.0–0.1)
EOS%: 3.8 % (ref 0.0–7.0)
Eosinophils Absolute: 0.2 10*3/uL (ref 0.0–0.5)
HEMATOCRIT: 21.6 % — AB (ref 38.4–49.9)
HGB: 6.7 g/dL — CL (ref 13.0–17.1)
LYMPH%: 14.6 % (ref 14.0–49.0)
MCH: 27.7 pg (ref 27.2–33.4)
MCHC: 31 g/dL — ABNORMAL LOW (ref 32.0–36.0)
MCV: 89.3 fL (ref 79.3–98.0)
MONO#: 0.5 10*3/uL (ref 0.1–0.9)
MONO%: 8.8 % (ref 0.0–14.0)
NEUT%: 72.1 % (ref 39.0–75.0)
NEUTROS ABS: 4.2 10*3/uL (ref 1.5–6.5)
PLATELETS: 352 10*3/uL (ref 140–400)
RBC: 2.42 10*6/uL — ABNORMAL LOW (ref 4.20–5.82)
RDW: 15 % — ABNORMAL HIGH (ref 11.0–14.6)
WBC: 5.8 10*3/uL (ref 4.0–10.3)
lymph#: 0.8 10*3/uL — ABNORMAL LOW (ref 0.9–3.3)

## 2013-12-14 MED ORDER — DARBEPOETIN ALFA-POLYSORBATE 300 MCG/0.6ML IJ SOLN
300.0000 ug | Freq: Once | INTRAMUSCULAR | Status: AC
  Administered 2013-12-14: 300 ug via SUBCUTANEOUS
  Filled 2013-12-14: qty 0.6

## 2013-12-14 NOTE — Patient Instructions (Signed)
Darbepoetin Alfa injection What is this medicine? DARBEPOETIN ALFA (dar be POE e tin AL fa) helps your body make more red blood cells. It is used to treat anemia caused by chronic kidney failure and chemotherapy. This medicine may be used for other purposes; ask your health care provider or pharmacist if you have questions. COMMON BRAND NAME(S): Aranesp What should I tell my health care provider before I take this medicine? They need to know if you have any of these conditions: -blood clotting disorders or history of blood clots -cancer patient not on chemotherapy -cystic fibrosis -heart disease, such as angina, heart failure, or a history of a heart attack -hemoglobin level of 12 g/dL or greater -high blood pressure -low levels of folate, iron, or vitamin B12 -seizures -an unusual or allergic reaction to darbepoetin, erythropoietin, albumin, hamster proteins, latex, other medicines, foods, dyes, or preservatives -pregnant or trying to get pregnant -breast-feeding How should I use this medicine? This medicine is for injection into a vein or under the skin. It is usually given by a health care professional in a hospital or clinic setting. If you get this medicine at home, you will be taught how to prepare and give this medicine. Do not shake the solution before you withdraw a dose. Use exactly as directed. Take your medicine at regular intervals. Do not take your medicine more often than directed. It is important that you put your used needles and syringes in a special sharps container. Do not put them in a trash can. If you do not have a sharps container, call your pharmacist or healthcare provider to get one. Talk to your pediatrician regarding the use of this medicine in children. While this medicine may be used in children as young as 1 year for selected conditions, precautions do apply. Overdosage: If you think you have taken too much of this medicine contact a poison control center or  emergency room at once. NOTE: This medicine is only for you. Do not share this medicine with others. What if I miss a dose? If you miss a dose, take it as soon as you can. If it is almost time for your next dose, take only that dose. Do not take double or extra doses. What may interact with this medicine? Do not take this medicine with any of the following medications: -epoetin alfa This list may not describe all possible interactions. Give your health care provider a list of all the medicines, herbs, non-prescription drugs, or dietary supplements you use. Also tell them if you smoke, drink alcohol, or use illegal drugs. Some items may interact with your medicine. What should I watch for while using this medicine? Visit your prescriber or health care professional for regular checks on your progress and for the needed blood tests and blood pressure measurements. It is especially important for the doctor to make sure your hemoglobin level is in the desired range, to limit the risk of potential side effects and to give you the best benefit. Keep all appointments for any recommended tests. Check your blood pressure as directed. Ask your doctor what your blood pressure should be and when you should contact him or her. As your body makes more red blood cells, you may need to take iron, folic acid, or vitamin B supplements. Ask your doctor or health care provider which products are right for you. If you have kidney disease continue dietary restrictions, even though this medication can make you feel better. Talk with your doctor or health   care professional about the foods you eat and the vitamins that you take. What side effects may I notice from receiving this medicine? Side effects that you should report to your doctor or health care professional as soon as possible: -allergic reactions like skin rash, itching or hives, swelling of the face, lips, or tongue -breathing problems -changes in vision -chest  pain -confusion, trouble speaking or understanding -feeling faint or lightheaded, falls -high blood pressure -muscle aches or pains -pain, swelling, warmth in the leg -rapid weight gain -severe headaches -sudden numbness or weakness of the face, arm or leg -trouble walking, dizziness, loss of balance or coordination -seizures (convulsions) -swelling of the ankles, feet, hands -unusually weak or tired Side effects that usually do not require medical attention (report to your doctor or health care professional if they continue or are bothersome): -diarrhea -fever, chills (flu-like symptoms) -headaches -nausea, vomiting -redness, stinging, or swelling at site where injected This list may not describe all possible side effects. Call your doctor for medical advice about side effects. You may report side effects to FDA at 1-800-FDA-1088. Where should I keep my medicine? Keep out of the reach of children. Store in a refrigerator between 2 and 8 degrees C (36 and 46 degrees F). Do not freeze. Do not shake. Throw away any unused portion if using a single-dose vial. Throw away any unused medicine after the expiration date. NOTE: This sheet is a summary. It may not cover all possible information. If you have questions about this medicine, talk to your doctor, pharmacist, or health care provider.  2015, [employer]vier/Gold Standard. (2008-05-04 10:23:57)  

## 2013-12-15 ENCOUNTER — Ambulatory Visit (HOSPITAL_BASED_OUTPATIENT_CLINIC_OR_DEPARTMENT_OTHER): Payer: Medicare Other

## 2013-12-15 VITALS — BP 170/63 | HR 55 | Temp 97.4°F | Resp 18

## 2013-12-15 DIAGNOSIS — D631 Anemia in chronic kidney disease: Secondary | ICD-10-CM | POA: Diagnosis not present

## 2013-12-15 DIAGNOSIS — D649 Anemia, unspecified: Secondary | ICD-10-CM

## 2013-12-15 LAB — PREPARE RBC (CROSSMATCH)

## 2013-12-15 MED ORDER — DIPHENHYDRAMINE HCL 25 MG PO CAPS
ORAL_CAPSULE | ORAL | Status: AC
  Filled 2013-12-15: qty 1

## 2013-12-15 MED ORDER — DIPHENHYDRAMINE HCL 25 MG PO CAPS
25.0000 mg | ORAL_CAPSULE | Freq: Once | ORAL | Status: AC
  Administered 2013-12-15: 25 mg via ORAL

## 2013-12-15 MED ORDER — ACETAMINOPHEN 325 MG PO TABS
650.0000 mg | ORAL_TABLET | Freq: Once | ORAL | Status: AC
  Administered 2013-12-15: 650 mg via ORAL

## 2013-12-15 MED ORDER — ACETAMINOPHEN 325 MG PO TABS
ORAL_TABLET | ORAL | Status: AC
  Filled 2013-12-15: qty 2

## 2013-12-15 MED ORDER — SODIUM CHLORIDE 0.9 % IV SOLN
250.0000 mL | Freq: Once | INTRAVENOUS | Status: AC
  Administered 2013-12-15: 250 mL via INTRAVENOUS

## 2013-12-15 NOTE — Progress Notes (Signed)
Patient BP slightly elevated, reports this is normal for him when he receives his blood. Also time for patient to take BP medication, took before discharge at chairside. Discharged ambulatory, in no acute distress.

## 2013-12-15 NOTE — Patient Instructions (Signed)

## 2013-12-16 LAB — TYPE AND SCREEN
ABO/RH(D): A POS
Antibody Screen: NEGATIVE
UNIT DIVISION: 0
Unit division: 0

## 2013-12-19 ENCOUNTER — Other Ambulatory Visit: Payer: Self-pay | Admitting: Cardiology

## 2013-12-28 ENCOUNTER — Ambulatory Visit (HOSPITAL_BASED_OUTPATIENT_CLINIC_OR_DEPARTMENT_OTHER): Payer: Medicare Other

## 2013-12-28 ENCOUNTER — Telehealth: Payer: Self-pay | Admitting: *Deleted

## 2013-12-28 ENCOUNTER — Other Ambulatory Visit: Payer: Self-pay

## 2013-12-28 ENCOUNTER — Ambulatory Visit: Payer: Medicare Other

## 2013-12-28 ENCOUNTER — Other Ambulatory Visit (HOSPITAL_BASED_OUTPATIENT_CLINIC_OR_DEPARTMENT_OTHER): Payer: Medicare Other

## 2013-12-28 ENCOUNTER — Other Ambulatory Visit: Payer: Self-pay | Admitting: *Deleted

## 2013-12-28 VITALS — BP 131/50 | HR 65 | Temp 97.0°F

## 2013-12-28 DIAGNOSIS — N039 Chronic nephritic syndrome with unspecified morphologic changes: Secondary | ICD-10-CM

## 2013-12-28 DIAGNOSIS — D473 Essential (hemorrhagic) thrombocythemia: Secondary | ICD-10-CM

## 2013-12-28 DIAGNOSIS — N189 Chronic kidney disease, unspecified: Secondary | ICD-10-CM

## 2013-12-28 DIAGNOSIS — D638 Anemia in other chronic diseases classified elsewhere: Secondary | ICD-10-CM

## 2013-12-28 DIAGNOSIS — D631 Anemia in chronic kidney disease: Secondary | ICD-10-CM

## 2013-12-28 LAB — PREPARE RBC (CROSSMATCH)

## 2013-12-28 LAB — CBC WITH DIFFERENTIAL/PLATELET
BASO%: 0.6 % (ref 0.0–2.0)
Basophils Absolute: 0 10*3/uL (ref 0.0–0.1)
EOS ABS: 0.2 10*3/uL (ref 0.0–0.5)
EOS%: 3.1 % (ref 0.0–7.0)
HCT: 22.3 % — ABNORMAL LOW (ref 38.4–49.9)
HGB: 6.9 g/dL — CL (ref 13.0–17.1)
LYMPH#: 0.9 10*3/uL (ref 0.9–3.3)
LYMPH%: 16.6 % (ref 14.0–49.0)
MCH: 27.2 pg (ref 27.2–33.4)
MCHC: 30.9 g/dL — ABNORMAL LOW (ref 32.0–36.0)
MCV: 87.8 fL (ref 79.3–98.0)
MONO#: 0.4 10*3/uL (ref 0.1–0.9)
MONO%: 7.5 % (ref 0.0–14.0)
NEUT#: 3.8 10*3/uL (ref 1.5–6.5)
NEUT%: 72.2 % (ref 39.0–75.0)
NRBC: 0 % (ref 0–0)
Platelets: 329 10*3/uL (ref 140–400)
RBC: 2.54 10*6/uL — AB (ref 4.20–5.82)
RDW: 14.8 % — AB (ref 11.0–14.6)
WBC: 5.2 10*3/uL (ref 4.0–10.3)

## 2013-12-28 MED ORDER — DARBEPOETIN ALFA-POLYSORBATE 300 MCG/0.6ML IJ SOLN
300.0000 ug | Freq: Once | INTRAMUSCULAR | Status: AC
  Administered 2013-12-28: 300 ug via SUBCUTANEOUS
  Filled 2013-12-28: qty 0.6

## 2013-12-28 NOTE — Progress Notes (Signed)
Patient is in for Aranesp 300 mcg injection today. HGB is 6.9 today. Dr. Boyce Medici nurse Thu Simon Rhein, RN made aware. Aranesp 300 mcg injection given per Jarvis Morgan, RN.

## 2013-12-28 NOTE — Patient Instructions (Signed)
Darbepoetin Alfa injection What is this medicine? DARBEPOETIN ALFA (dar be POE e tin AL fa) helps your body make more red blood cells. It is used to treat anemia caused by chronic kidney failure and chemotherapy. This medicine may be used for other purposes; ask your health care provider or pharmacist if you have questions. COMMON BRAND NAME(S): Aranesp What should I tell my health care provider before I take this medicine? They need to know if you have any of these conditions: -blood clotting disorders or history of blood clots -cancer patient not on chemotherapy -cystic fibrosis -heart disease, such as angina, heart failure, or a history of a heart attack -hemoglobin level of 12 g/dL or greater -high blood pressure -low levels of folate, iron, or vitamin B12 -seizures -an unusual or allergic reaction to darbepoetin, erythropoietin, albumin, hamster proteins, latex, other medicines, foods, dyes, or preservatives -pregnant or trying to get pregnant -breast-feeding How should I use this medicine? This medicine is for injection into a vein or under the skin. It is usually given by a health care professional in a hospital or clinic setting. If you get this medicine at home, you will be taught how to prepare and give this medicine. Do not shake the solution before you withdraw a dose. Use exactly as directed. Take your medicine at regular intervals. Do not take your medicine more often than directed. It is important that you put your used needles and syringes in a special sharps container. Do not put them in a trash can. If you do not have a sharps container, call your pharmacist or healthcare provider to get one. Talk to your pediatrician regarding the use of this medicine in children. While this medicine may be used in children as young as 1 year for selected conditions, precautions do apply. Overdosage: If you think you have taken too much of this medicine contact a poison control center or  emergency room at once. NOTE: This medicine is only for you. Do not share this medicine with others. What if I miss a dose? If you miss a dose, take it as soon as you can. If it is almost time for your next dose, take only that dose. Do not take double or extra doses. What may interact with this medicine? Do not take this medicine with any of the following medications: -epoetin alfa This list may not describe all possible interactions. Give your health care provider a list of all the medicines, herbs, non-prescription drugs, or dietary supplements you use. Also tell them if you smoke, drink alcohol, or use illegal drugs. Some items may interact with your medicine. What should I watch for while using this medicine? Visit your prescriber or health care professional for regular checks on your progress and for the needed blood tests and blood pressure measurements. It is especially important for the doctor to make sure your hemoglobin level is in the desired range, to limit the risk of potential side effects and to give you the best benefit. Keep all appointments for any recommended tests. Check your blood pressure as directed. Ask your doctor what your blood pressure should be and when you should contact him or her. As your body makes more red blood cells, you may need to take iron, folic acid, or vitamin B supplements. Ask your doctor or health care provider which products are right for you. If you have kidney disease continue dietary restrictions, even though this medication can make you feel better. Talk with your doctor or health   care professional about the foods you eat and the vitamins that you take. What side effects may I notice from receiving this medicine? Side effects that you should report to your doctor or health care professional as soon as possible: -allergic reactions like skin rash, itching or hives, swelling of the face, lips, or tongue -breathing problems -changes in vision -chest  pain -confusion, trouble speaking or understanding -feeling faint or lightheaded, falls -high blood pressure -muscle aches or pains -pain, swelling, warmth in the leg -rapid weight gain -severe headaches -sudden numbness or weakness of the face, arm or leg -trouble walking, dizziness, loss of balance or coordination -seizures (convulsions) -swelling of the ankles, feet, hands -unusually weak or tired Side effects that usually do not require medical attention (report to your doctor or health care professional if they continue or are bothersome): -diarrhea -fever, chills (flu-like symptoms) -headaches -nausea, vomiting -redness, stinging, or swelling at site where injected This list may not describe all possible side effects. Call your doctor for medical advice about side effects. You may report side effects to FDA at 1-800-FDA-1088. Where should I keep my medicine? Keep out of the reach of children. Store in a refrigerator between 2 and 8 degrees C (36 and 46 degrees F). Do not freeze. Do not shake. Throw away any unused portion if using a single-dose vial. Throw away any unused medicine after the expiration date. NOTE: This sheet is a summary. It may not cover all possible information. If you have questions about this medicine, talk to your doctor, pharmacist, or health care provider.  2015, [employer]vier/Gold Standard. (2008-05-04 10:23:57)  

## 2013-12-28 NOTE — Telephone Encounter (Signed)
Spoke with pt and informed pt re:  Pt has appt for blood transfusion at 0900 on 12/29/13 [facility].  Pt voiced understanding.

## 2013-12-28 NOTE — Telephone Encounter (Signed)
ERROR

## 2013-12-29 ENCOUNTER — Telehealth: Payer: Self-pay | Admitting: *Deleted

## 2013-12-29 ENCOUNTER — Other Ambulatory Visit: Payer: Self-pay

## 2013-12-29 ENCOUNTER — Non-Acute Institutional Stay (HOSPITAL_COMMUNITY)
Admission: AD | Admit: 2013-12-29 | Discharge: 2013-12-29 | Disposition: A | Discharge status: Home / Self Care | Payer: Medicare Other | Source: Ambulatory Visit | Attending: Internal Medicine | Admitting: Internal Medicine

## 2013-12-29 ENCOUNTER — Other Ambulatory Visit: Payer: Self-pay | Admitting: Internal Medicine

## 2013-12-29 DIAGNOSIS — D473 Essential (hemorrhagic) thrombocythemia: Secondary | ICD-10-CM

## 2013-12-29 DIAGNOSIS — N039 Chronic nephritic syndrome with unspecified morphologic changes: Secondary | ICD-10-CM | POA: Diagnosis not present

## 2013-12-29 DIAGNOSIS — D631 Anemia in chronic kidney disease: Secondary | ICD-10-CM | POA: Diagnosis not present

## 2013-12-29 DIAGNOSIS — D509 Iron deficiency anemia, unspecified: Secondary | ICD-10-CM

## 2013-12-29 DIAGNOSIS — D638 Anemia in other chronic diseases classified elsewhere: Secondary | ICD-10-CM

## 2013-12-29 LAB — IRON AND TIBC
IRON: 19 ug/dL — AB (ref 42–135)
Saturation Ratios: 6 % — ABNORMAL LOW (ref 20–55)
TIBC: 329 ug/dL (ref 215–435)
UIBC: 310 ug/dL (ref 125–400)

## 2013-12-29 LAB — FERRITIN: Ferritin: 12 ng/mL — ABNORMAL LOW (ref 22–322)

## 2013-12-29 MED ORDER — DIPHENHYDRAMINE HCL 25 MG PO CAPS
25.0000 mg | ORAL_CAPSULE | Freq: Once | ORAL | Status: AC
  Administered 2013-12-29: 25 mg via ORAL
  Filled 2013-12-29: qty 1

## 2013-12-29 MED ORDER — SODIUM CHLORIDE 0.9 % IJ SOLN: 10.0000 mL | INTRAMUSCULAR | Status: DC | PRN

## 2013-12-29 MED ORDER — SODIUM CHLORIDE 0.9 % IV SOLN
250.0000 mL | Freq: Once | INTRAVENOUS | Status: AC
  Administered 2013-12-29: 250 mL via INTRAVENOUS

## 2013-12-29 MED ORDER — HEPARIN SOD (PORK) LOCK FLUSH 100 UNIT/ML IV SOLN: 500.0000 [IU] | Freq: Once | INTRAVENOUS | Status: DC

## 2013-12-29 MED ORDER — ACETAMINOPHEN 325 MG PO TABS
650.0000 mg | ORAL_TABLET | Freq: Once | ORAL | Status: AC
  Administered 2013-12-29: 650 mg via ORAL
  Filled 2013-12-29: qty 2

## 2013-12-29 NOTE — Procedures (Signed)
[facility]  Procedure Note  [name] KPQ:[id number] DOB: 1932-01-09 DOA: 12/29/2013   PCP: [name], MD   Associated Diagnosis: Anemia of chronic disease   Procedure Note: Transfusion of 2 units PRBCs   Condition During Procedure: Pt tolerated well   Condition at Discharge:  No complications noted; pt alert and oriented; ambulatory   Tamala Julian, Salley Slaughter, [facility]

## 2013-12-29 NOTE — H&P (Signed)
Blood transfusion only.   

## 2013-12-29 NOTE — Progress Notes (Signed)
Wife called requesting iron check. Pt has not had iron in awhile. He is getting blood today at [facility] and would like to get iron as well if it is needed. Save a trip. S/w Dr Juliann Mule and [place].

## 2013-12-29 NOTE — Telephone Encounter (Signed)
Per [facility] staff phone call scheduled appts. Advised schedulers 

## 2013-12-30 ENCOUNTER — Other Ambulatory Visit: Payer: Self-pay | Admitting: Internal Medicine

## 2013-12-30 ENCOUNTER — Other Ambulatory Visit: Payer: Self-pay | Admitting: *Deleted

## 2013-12-30 ENCOUNTER — Telehealth: Payer: Self-pay | Admitting: *Deleted

## 2013-12-30 ENCOUNTER — Ambulatory Visit (HOSPITAL_BASED_OUTPATIENT_CLINIC_OR_DEPARTMENT_OTHER): Payer: Medicare Other

## 2013-12-30 VITALS — BP 128/50 | HR 57 | Temp 97.6°F | Resp 16

## 2013-12-30 DIAGNOSIS — N189 Chronic kidney disease, unspecified: Principal | ICD-10-CM

## 2013-12-30 DIAGNOSIS — D649 Anemia, unspecified: Secondary | ICD-10-CM

## 2013-12-30 DIAGNOSIS — D631 Anemia in chronic kidney disease: Secondary | ICD-10-CM

## 2013-12-30 DIAGNOSIS — N039 Chronic nephritic syndrome with unspecified morphologic changes: Secondary | ICD-10-CM

## 2013-12-30 LAB — TYPE AND SCREEN
ABO/RH(D): A POS
Antibody Screen: NEGATIVE
UNIT DIVISION: 0
Unit division: 0

## 2013-12-30 MED ORDER — SODIUM CHLORIDE 0.9 % IV SOLN
Freq: Once | INTRAVENOUS | Status: AC
  Administered 2013-12-30: 08:00:00 via INTRAVENOUS

## 2013-12-30 MED ORDER — METHYLPREDNISOLONE SODIUM SUCC 40 MG IJ SOLR
INTRAMUSCULAR | Status: AC
  Filled 2013-12-30: qty 1

## 2013-12-30 MED ORDER — METHYLPREDNISOLONE SODIUM SUCC 40 MG IJ SOLR
20.0000 mg | Freq: Once | INTRAMUSCULAR | Status: AC
  Administered 2013-12-30: 20 mg via INTRAVENOUS

## 2013-12-30 MED ORDER — DIPHENHYDRAMINE HCL 25 MG PO CAPS
ORAL_CAPSULE | ORAL | Status: AC
  Filled 2013-12-30: qty 1

## 2013-12-30 MED ORDER — FERUMOXYTOL INJECTION 510 MG/17 ML
1020.0000 mg | Freq: Once | INTRAVENOUS | Status: AC
  Administered 2013-12-30: 1020 mg via INTRAVENOUS
  Filled 2013-12-30: qty 34

## 2013-12-30 MED ORDER — DIPHENHYDRAMINE HCL 25 MG PO CAPS
25.0000 mg | ORAL_CAPSULE | Freq: Once | ORAL | Status: AC
  Administered 2013-12-30: 25 mg via ORAL

## 2013-12-30 NOTE — Patient Instructions (Signed)

## 2013-12-30 NOTE — Progress Notes (Signed)
No premeds ordered under current treatment plan. Patient states he has h/o rash with feraheme and usually receives benadryl. Per Dr. Juliann Mule, ok to give premeds given with past infusions, benadryl po 25 mg and solumedrol 20 mg IV. Pharmacy to enter orders.

## 2013-12-30 NOTE — Telephone Encounter (Signed)
Spoke with pt in infusion room today.  Pt requested stool card.  Dr. Juliann Mule notified.  OK to give stool card.  Informed pt also that per md, pt will be referred to GI specialty for further evaluation of causes of anemia.  Pt voiced understanding.

## 2013-12-31 ENCOUNTER — Encounter: Payer: Self-pay | Admitting: Gastroenterology

## 2013-12-31 ENCOUNTER — Telehealth: Payer: Self-pay | Admitting: Internal Medicine

## 2013-12-31 NOTE — Telephone Encounter (Signed)
Lft msg for pt concerning apt w/[facility], advised to get updated copy when he comes in on 08/03.Marland Kitchen..KJ

## 2014-01-01 ENCOUNTER — Telehealth: Payer: Self-pay

## 2014-01-01 ENCOUNTER — Ambulatory Visit (HOSPITAL_BASED_OUTPATIENT_CLINIC_OR_DEPARTMENT_OTHER): Payer: Medicare Other

## 2014-01-01 ENCOUNTER — Telehealth: Payer: Self-pay | Admitting: Physician Assistant

## 2014-01-01 DIAGNOSIS — D631 Anemia in chronic kidney disease: Secondary | ICD-10-CM

## 2014-01-01 DIAGNOSIS — N189 Chronic kidney disease, unspecified: Secondary | ICD-10-CM

## 2014-01-01 DIAGNOSIS — N039 Chronic nephritic syndrome with unspecified morphologic changes: Secondary | ICD-10-CM

## 2014-01-01 LAB — FECAL OCCULT BLOOD, GUAIAC: OCCULT BLOOD: POSITIVE

## 2014-01-01 NOTE — Telephone Encounter (Signed)
lvm that guaic stool was + and GI appt has been changed to next Friday 8/7 at 1030 am, to arrive at office at 1015 am.

## 2014-01-01 NOTE — Telephone Encounter (Signed)
, °

## 2014-01-04 ENCOUNTER — Other Ambulatory Visit (HOSPITAL_BASED_OUTPATIENT_CLINIC_OR_DEPARTMENT_OTHER): Payer: Medicare Other

## 2014-01-04 DIAGNOSIS — N189 Chronic kidney disease, unspecified: Secondary | ICD-10-CM

## 2014-01-04 DIAGNOSIS — N039 Chronic nephritic syndrome with unspecified morphologic changes: Secondary | ICD-10-CM

## 2014-01-04 DIAGNOSIS — D631 Anemia in chronic kidney disease: Secondary | ICD-10-CM

## 2014-01-04 LAB — CBC WITH DIFFERENTIAL/PLATELET
BASO%: 1 % (ref 0.0–2.0)
BASOS ABS: 0.1 10*3/uL (ref 0.0–0.1)
EOS%: 3.2 % (ref 0.0–7.0)
Eosinophils Absolute: 0.2 10*3/uL (ref 0.0–0.5)
HEMATOCRIT: 24.9 % — AB (ref 38.4–49.9)
HEMOGLOBIN: 7.9 g/dL — AB (ref 13.0–17.1)
LYMPH#: 0.8 10*3/uL — AB (ref 0.9–3.3)
LYMPH%: 12.2 % — ABNORMAL LOW (ref 14.0–49.0)
MCH: 28.6 pg (ref 27.2–33.4)
MCHC: 31.7 g/dL — ABNORMAL LOW (ref 32.0–36.0)
MCV: 90 fL (ref 79.3–98.0)
MONO#: 0.6 10*3/uL (ref 0.1–0.9)
MONO%: 9.1 % (ref 0.0–14.0)
NEUT#: 4.8 10*3/uL (ref 1.5–6.5)
NEUT%: 74.5 % (ref 39.0–75.0)
Platelets: 371 10*3/uL (ref 140–400)
RBC: 2.76 10*6/uL — ABNORMAL LOW (ref 4.20–5.82)
RDW: 16.3 % — AB (ref 11.0–14.6)
WBC: 6.4 10*3/uL (ref 4.0–10.3)

## 2014-01-08 ENCOUNTER — Encounter: Payer: Self-pay | Admitting: *Deleted

## 2014-01-08 ENCOUNTER — Telehealth: Payer: Self-pay | Admitting: *Deleted

## 2014-01-08 ENCOUNTER — Ambulatory Visit (INDEPENDENT_AMBULATORY_CARE_PROVIDER_SITE_OTHER): Payer: Medicare Other | Admitting: Gastroenterology

## 2014-01-08 VITALS — BP 148/52 | HR 62 | Ht 70.0 in | Wt 154.0 lb

## 2014-01-08 DIAGNOSIS — D649 Anemia, unspecified: Secondary | ICD-10-CM

## 2014-01-08 DIAGNOSIS — R195 Other fecal abnormalities: Secondary | ICD-10-CM | POA: Insufficient documentation

## 2014-01-08 DIAGNOSIS — I2510 Atherosclerotic heart disease of native coronary artery without angina pectoris: Secondary | ICD-10-CM

## 2014-01-08 MED ORDER — MOVIPREP 100 G PO SOLR: 1.0000 | Freq: Once | ORAL | Status: DC

## 2014-01-08 NOTE — Patient Instructions (Signed)
You have been scheduled for an endoscopy and colonoscopy at [facility] with Dr. Deatra Ina. Please follow the written instructions given to you at your visit today. Please pick up your prep at the pharmacy within the next 1-3 days. If you use inhalers (even only as needed), please bring them with you on the day of your procedure. Your physician has requested that you go to www.startemmi.com and enter the access code given to you at your visit today. This web site gives a general overview about your procedure. However, you should still follow specific instructions given to you by our office regarding your preparation for the procedure.

## 2014-01-08 NOTE — Progress Notes (Signed)
Reviewed and agree with management. Robert D. Kaplan, M.D., FACG  

## 2014-01-08 NOTE — Telephone Encounter (Signed)
01/08/2014   RE: [name] DOB: 1932/02/08 MRN: [id number]   Dear Dr. Juliann Mule,    We have scheduled the above patient for an Colonoscopy and Upper Endoscopy our records show that he is on anticoagulation therapy.  Please advise as to how long the patient may come off his therapy of Agrylin prior to the procedure, which is scheduled for 02-02-2014.  Please fax back/ or route the completed form to Evette Georges., CMA    Sincerely,    Hope Pigeon

## 2014-01-08 NOTE — Progress Notes (Signed)
01/08/2014 [name] [id number] 01-17-1932   HISTORY OF PRESENT ILLNESS:  This is an 80 year old male who is previously known to Dr. Deatra Ina.  He had an EGD in 10/2007 at which time he was found to have a hiatal hernia and an esophageal stricture that was dilated. He also had a nodule in the antrum of the stomach that had central umbilication consistent with pancreatic rest. His last colonoscopy was in May 2006 at which time he was found to have internal hemorrhoids and erythema from the sigmoid to the proximal colon; biopsies were consistent with resolving self-limiting colitis. He has past medical history of chronic kidney disease with fistula placement, however, not yet on dialysis. Coronary artery disease status post CABG in 2006, and thrombocythemia for which he follows with hematology, Dr. Juliann Mule, and is on anagrelide for that.  Also has a chronic anemia, which has been thought to be due to his chronic kidney disease. Hemoglobin tends to run in the high 8-9 g range. He does receive Aranesp for that as well. Recently, however, his hemoglobin dropped to 6.7 grams last month. He actually received 2 transfusions of packed red blood cells, 2 units on each occasion with a total of 4 units. Also got IV iron.  Most recent hemoglobin is week was 7.9 g and he is due to have it repeated next week. He was found to be heme-positive on 3 occasions, therefore, was sent here for further evaluation from GI standpoint. He denies any red blood in his stool. He said that recently while doing the stool tests his stools were dark in color, however.  He does admit to a lot of constipation for which he takes MiraLax and milk of magnesia with relief. Recent ferritin was low at 12, iron low at 19, percent saturation low at 6, TIBC normal at 329.   Past Medical History  Diagnosis Date  . Chronic kidney disease   . Thrombocythemia   . Esophageal stricture   . Anemia   . Anemia due to chronic renal failure treated with  erythropoietin 03/24/2011  . Retinal vein thrombosis, left   . Coronary artery disease   . Hypertension   . GERD (gastroesophageal reflux disease)   . Arthritis    Past Surgical History  Procedure Laterality Date  . Coronary artery bypass graft  09/13/04  . Replacement total knee Right 04/19/08  . Inguinal hernia repair Right 05/08/01  . Eye surgery    . Av fistula placement Left 12/01/2013    Procedure: RADIOCEPHALIC ARTERIOVENOUS (AV) FISTULA CREATION;  Surgeon: Elam Dutch, MD;  Location: [facility];  Service: Vascular;  Laterality: Left;    reports that he quit smoking about 35 years ago. His smoking use included Cigarettes. He smoked 0.00 packs per day. He has quit using smokeless tobacco. His smokeless tobacco use included Chew. He reports that he does not drink alcohol or use illicit drugs. family history includes Angina in his father; Dementia in his mother. Allergies  Allergen Reactions  . Norvasc [Amlodipine Besylate] Other (See Comments)    TIA's      Outpatient Encounter Prescriptions as of 01/08/2014  Medication Sig  . acyclovir (ZOVIRAX) 200 MG capsule Take 200 mg by mouth Daily.   Marland Kitchen ALPRAZolam (XANAX) 0.25 MG tablet Take 0.25 mg by mouth 3 (three) times daily as needed for anxiety.   Marland Kitchen anagrelide (AGRYLIN) 1 MG capsule Take 1 mg by mouth daily.  Marland Kitchen anagrelide (AGRYLIN) 1 MG capsule  take 1 capsule by mouth daily or as directed  . aspirin 81 MG EC tablet Take 81 mg by mouth daily. Swallow whole.  . carbidopa-levodopa (SINEMET IR) 25-100 MG per tablet Take 1 tablet by mouth 3 (three) times daily.  . carboxymethylcellulose (REFRESH PLUS) 0.5 % SOLN Place 1 drop into both eyes 3 (three) times daily as needed (dry eyes).  . cloNIDine (CATAPRES) 0.3 MG tablet Take 0.3 mg by mouth 3 (three) times daily.    . fluticasone (FLONASE) 50 MCG/ACT nasal spray Place 1 spray into both nostrils daily as needed for allergies. As directed  . furosemide (LASIX) 40 MG tablet Take 60 mg by  mouth daily.   . hydrALAZINE (APRESOLINE) 25 MG tablet Take 1 tablet (25 mg total) by mouth 3 (three) times daily.  Marland Kitchen ketoconazole (NIZORAL) 2 % cream Apply 1 application topically daily as needed for irritation.   . metoprolol succinate (TOPROL-XL) 100 MG 24 hr tablet TAKE 1 TABLET BY MOUTH 2 TIMES DAILY OR WITH IMMEDIATELY AFTER A MEAL  . oxyCODONE (ROXICODONE) 5 MG immediate release tablet Take 1 tablet (5 mg total) by mouth every 6 (six) hours as needed for severe pain.  . pantoprazole (PROTONIX) 40 MG tablet Take 40 mg by mouth daily.  . psyllium (METAMUCIL) 58.6 % powder Take 1 packet by mouth daily as needed (constipation).   . Tamsulosin HCl (FLOMAX) 0.4 MG CAPS Take 0.4 mg by mouth daily after supper.   . triamcinolone cream (KENALOG) 0.1 % Apply 1 application topically daily as needed (itching).   . zolpidem (AMBIEN) 5 MG tablet Take 5 mg by mouth at bedtime as needed for sleep.   Marland Kitchen MOVIPREP 100 G SOLR Take 1 kit (200 g total) by mouth once.     REVIEW OF SYSTEMS  : All other systems reviewed and negative except where noted in the History of Present Illness.   PHYSICAL EXAM: BP 148/52  Pulse 62  Ht $R'5\' 10"'Ip$  (1.778 m)  Wt 154 lb (69.854 kg)  BMI 22.10 kg/m2 General: Well developed white male in no acute distress Head: Normocephalic and atraumatic Eyes:  Sclerae anicteric, conjunctiva pink. Ears: HOH Lungs: Clear throughout to auscultation Heart: Regular rate and rhythm Abdomen: Soft, non-distended.  Normal bowel sounds.  Non-tender. Rectal:  Deferred.  Will be done at the time of colonoscopy. Musculoskeletal: Symmetrical with no gross deformities  Skin: No lesions on visible extremities Extremities: No edema  Neurological: Alert oriented x 4, grossly non-focal Psychological:  Alert and cooperative. Normal mood and affect  ASSESSMENT AND PLAN: -Anemia:  Likely multifactorial.  Thought chronically to be from CKD and receives aranesp, but recent decrease in Hgb with heme  positive stool x 3.  No overt bleeding.  Recently received 4 units PRBC's and IV iron.   -Thrombocythemia:  On anagrelide per hematology.    *Will schedule EGD and colonoscopy for further evaluation. *Hgb being monitored by hematology.  The risks, benefits, and alternatives were discussed with the patient and he consents to proceed.  The risks benefits and alternatives to a temporary hold of anti-coagulants/anti-platelets for the procedure were discussed with the patient he consents to proceed. Obtain clearance from Dr. Juliann Mule for ok to hold anagrelide for 7 days.

## 2014-01-11 ENCOUNTER — Ambulatory Visit: Payer: Medicare Other

## 2014-01-11 ENCOUNTER — Telehealth: Payer: Self-pay | Admitting: Hematology and Oncology

## 2014-01-11 ENCOUNTER — Other Ambulatory Visit: Payer: Medicare Other

## 2014-01-11 ENCOUNTER — Ambulatory Visit (HOSPITAL_BASED_OUTPATIENT_CLINIC_OR_DEPARTMENT_OTHER): Payer: Medicare Other | Admitting: Physician Assistant

## 2014-01-11 ENCOUNTER — Telehealth: Payer: Self-pay | Admitting: *Deleted

## 2014-01-11 ENCOUNTER — Ambulatory Visit (HOSPITAL_BASED_OUTPATIENT_CLINIC_OR_DEPARTMENT_OTHER): Payer: Medicare Other

## 2014-01-11 ENCOUNTER — Encounter: Payer: Self-pay | Admitting: Physician Assistant

## 2014-01-11 ENCOUNTER — Other Ambulatory Visit (HOSPITAL_BASED_OUTPATIENT_CLINIC_OR_DEPARTMENT_OTHER): Payer: Medicare Other

## 2014-01-11 VITALS — BP 122/54 | HR 60 | Temp 97.4°F | Resp 18 | Ht 70.0 in | Wt 153.7 lb

## 2014-01-11 DIAGNOSIS — N039 Chronic nephritic syndrome with unspecified morphologic changes: Secondary | ICD-10-CM

## 2014-01-11 DIAGNOSIS — R269 Unspecified abnormalities of gait and mobility: Secondary | ICD-10-CM

## 2014-01-11 DIAGNOSIS — D473 Essential (hemorrhagic) thrombocythemia: Secondary | ICD-10-CM | POA: Diagnosis not present

## 2014-01-11 DIAGNOSIS — D631 Anemia in chronic kidney disease: Secondary | ICD-10-CM

## 2014-01-11 DIAGNOSIS — N189 Chronic kidney disease, unspecified: Secondary | ICD-10-CM

## 2014-01-11 LAB — CBC WITH DIFFERENTIAL/PLATELET
BASO%: 0.5 % (ref 0.0–2.0)
BASOS ABS: 0 10*3/uL (ref 0.0–0.1)
EOS%: 3.6 % (ref 0.0–7.0)
Eosinophils Absolute: 0.1 10*3/uL (ref 0.0–0.5)
HEMATOCRIT: 26.3 % — AB (ref 38.4–49.9)
HEMOGLOBIN: 8 g/dL — AB (ref 13.0–17.1)
LYMPH%: 16.7 % (ref 14.0–49.0)
MCH: 27.9 pg (ref 27.2–33.4)
MCHC: 30.4 g/dL — ABNORMAL LOW (ref 32.0–36.0)
MCV: 91.6 fL (ref 79.3–98.0)
MONO#: 0.3 10*3/uL (ref 0.1–0.9)
MONO%: 8.7 % (ref 0.0–14.0)
NEUT#: 2.8 10*3/uL (ref 1.5–6.5)
NEUT%: 70.5 % (ref 39.0–75.0)
Platelets: 286 10*3/uL (ref 140–400)
RBC: 2.87 10*6/uL — ABNORMAL LOW (ref 4.20–5.82)
RDW: 17.5 % — AB (ref 11.0–14.6)
WBC: 3.9 10*3/uL — ABNORMAL LOW (ref 4.0–10.3)
lymph#: 0.7 10*3/uL — ABNORMAL LOW (ref 0.9–3.3)

## 2014-01-11 LAB — BASIC METABOLIC PANEL
BUN: 93 mg/dL — AB (ref 6–23)
CHLORIDE: 102 meq/L (ref 96–112)
CO2: 22 mEq/L (ref 19–32)
Calcium: 8.7 mg/dL (ref 8.4–10.5)
Creatinine, Ser: 2.86 mg/dL — ABNORMAL HIGH (ref 0.50–1.35)
Glucose, Bld: 97 mg/dL (ref 70–99)
POTASSIUM: 4.9 meq/L (ref 3.5–5.3)
Sodium: 139 mEq/L (ref 135–145)

## 2014-01-11 MED ORDER — DARBEPOETIN ALFA-POLYSORBATE 300 MCG/0.6ML IJ SOLN
300.0000 ug | Freq: Once | INTRAMUSCULAR | Status: AC
  Administered 2014-01-11: 300 ug via SUBCUTANEOUS
  Filled 2014-01-11: qty 0.6

## 2014-01-11 NOTE — Progress Notes (Signed)
[name] OFFICE PROGRESS NOTE  [name], [place] [address]  DIAGNOSIS: Anemia due to chronic renal failure treated with erythropoietin, unspecified stage - Plan: CBC with Differential  No chief complaint on file.   CURRENT THERAPY: 1. Aspirin 81 mg by mouth daily. 2. Anagrelide (Agrylin) 1 mg by mouth daily. 3. Aranesp 300 mcg subcu every 2 weeks for hemoglobin less than 11.   INTERVAL HISTORY: [name] 80 y.o. male history of essential thrombocythemia and anemia due to chronic renal failure treated with erythropoietin is her for 2 month follow-up.  He was last seen by [place] on 09/21/2013. He reports doing very well.  He denies recent hospitalizations or recurrent infections or bleeding.  He denies hematochezia or melena.   His weight is stable. He saw neurology for balance problems and had an MRI of brain done which he reports was negative.  He was given medicine (sinemet) for parkinson's disease.  He is follow up again with neurology in December. He reports he is walking and doing some light aerobics at home. He is on Xanax and will request a refill from his primary care provider.   MEDICAL HISTORY: Past Medical History  Diagnosis Date  . Chronic kidney disease   . Thrombocythemia   . Esophageal stricture   . Anemia   . Anemia due to chronic renal failure treated with erythropoietin 03/24/2011  . Retinal vein thrombosis, left   . Coronary artery disease   . Hypertension   . GERD (gastroesophageal reflux disease)   . Arthritis     INTERIM HISTORY: has CAD; STRICTURE AND STENOSIS OF ESOPHAGUS; ESOPHAGEAL REFLUX; Essential thrombocythemia; Anemia due to chronic renal failure treated with erythropoietin; Retinal vein thrombosis, left; Murmur; CKD (chronic kidney disease); Ischemic cardiomyopathy; Essential hypertension; Abnormal involuntary movement; Abnormality of gait; End stage renal disease; Anemia, unspecified; and Heme + stool on his  problem list.    ALLERGIES:  is allergic to norvasc.  MEDICATIONS: has a current medication list which includes the following prescription(s): alprazolam, anagrelide, anagrelide, carboxymethylcellulose, clonidine, fluticasone, furosemide, hydralazine, hydrocodone-acetaminophen, ketoconazole, metoprolol succinate, moviprep, oxycodone, pantoprazole, psyllium, tamsulosin, triamcinolone cream, acyclovir, amoxicillin-clavulanate, aspirin, carbidopa-levodopa, and zolpidem.  SURGICAL HISTORY:  Past Surgical History  Procedure Laterality Date  . Coronary artery bypass graft  09/13/04  . Replacement total knee Right 04/19/08  . Inguinal hernia repair Right 05/08/01  . Eye surgery    . Av fistula placement Left 12/01/2013    Procedure: RADIOCEPHALIC ARTERIOVENOUS (AV) FISTULA CREATION;  Surgeon: Bryan Dutch, MD;  Location: [facility] OR;  Service: Vascular;  Laterality: Left;    REVIEW OF SYSTEMS:   Constitutional: Denies fevers, chills or abnormal weight loss Eyes: Denies blurriness of vision Ears, nose, mouth, throat, and face: Denies mucositis or sore throat Respiratory: Denies cough, dyspnea or wheezes Cardiovascular: Denies palpitation, chest discomfort or lower extremity swelling Gastrointestinal:  Denies nausea, heartburn or change in bowel habits Skin: Denies abnormal skin rashes Lymphatics: Denies new lymphadenopathy or easy bruising Neurological:Denies numbness, tingling or new weaknesses. Balance has improved a little Behavioral/Psych: Mood is stable, no new changes  All other systems were reviewed with the patient and are negative.  PHYSICAL EXAMINATION: ECOG PERFORMANCE STATUS: 0 - Asymptomatic  Blood pressure 122/54, pulse 60, temperature 97.4 F (36.3 C), temperature source Oral, resp. rate 18, height 5\' 10"  (1.778 m), weight 153 lb 11.2 oz (69.718 kg), SpO2 96.00%.  GENERAL:alert, no distress and comfortable, elderly male; moves limbs spontaneously through out exams  SKIN: skin  color, texture, turgor are normal, no rashes EYES: normal, Conjunctiva are pink and non-injected, sclera clear OROPHARYNX:no exudate, no erythema and lips, buccal mucosa, and tongue normal  NECK: supple, thyroid normal size, non-tender, without nodularity LYMPH:  no palpable lymphadenopathy in the cervical, axillary or inguinal LUNGS: clear to auscultation and percussion with normal breathing effort HEART: regular rate & rhythm and no murmurs and no lower extremity edema ABDOMEN:abdomen soft, non-tender and normal bowel sounds Musculoskeletal:no peripheral edema NEURO: alert & oriented x 3 with fluent speech, no focal motor/sensory deficits; shuffling gait noted.    LABORATORY DATA: Results for orders placed in visit on 01/11/14 (from the past 48 hour(s))  CBC WITH DIFFERENTIAL     Status: Abnormal   Collection Time    01/11/14  3:05 PM      Result Value Ref Range   WBC 3.9 (*) 4.0 - 10.3 10e3/uL   NEUT# 2.8  1.5 - 6.5 10e3/uL   HGB 8.0 (*) 13.0 - 17.1 g/dL   HCT 26.3 (*) 38.4 - 49.9 %   Platelets 286  140 - 400 10e3/uL   MCV 91.6  79.3 - 98.0 fL   MCH 27.9  27.2 - 33.4 pg   MCHC 30.4 (*) 32.0 - 36.0 g/dL   RBC 2.87 (*) 4.20 - 5.82 10e6/uL   RDW 17.5 (*) 11.0 - 14.6 %   lymph# 0.7 (*) 0.9 - 3.3 10e3/uL   MONO# 0.3  0.1 - 0.9 10e3/uL   Eosinophils Absolute 0.1  0.0 - 0.5 10e3/uL   Basophils Absolute 0.0  0.0 - 0.1 10e3/uL   NEUT% 70.5  39.0 - 75.0 %   LYMPH% 16.7  14.0 - 49.0 %   MONO% 8.7  0.0 - 14.0 %   EOS% 3.6  0.0 - 7.0 %   BASO% 0.5  0.0 - 2.0 %  BASIC METABOLIC PANEL     Status: Abnormal   Collection Time    01/11/14  3:05 PM      Result Value Ref Range   Sodium 139  135 - 145 mEq/L   Potassium 4.9  3.5 - 5.3 mEq/L   Chloride 102  96 - 112 mEq/L   CO2 22  19 - 32 mEq/L   Glucose, Bld 97  70 - 99 mg/dL   BUN 93 (*) 6 - 23 mg/dL   Creatinine, Ser 2.86 (*) 0.50 - 1.35 mg/dL   Calcium 8.7  8.4 - 10.5 mg/dL    RADIOGRAPHIC STUDIES: No results  found.  ASSESSMENT/PLAN:   1. Essential Thrombocythemia (09/1993).  --[name] continues to do fairly well on the current treatment program.  --Platelet counts have been quite satisfactory usually in the 300-400,000 range. Platelet count today is 286,000.  2. Anemia secondary to chronic renal disease. --Based on hemoglobin of 8.0 . He will receive Aranesp today 300 mcg subcu today. We will continue to check CBCs every 2 weeks (from every 3 weeks) and we will give him Aranesp 300 mcg subcu whenever the hemoglobin is less than 11. Erythropoietin level is 33.9. He is asymptomatic at his current level of anemia.  3. Shuffling gait, episodic movements. --He will follow up with neurology (Dr. Krista Blue) prn and his next scheduled appointment is 05/2014. He was started on sinemet but it did not improve his symptoms. He was again counseled on fall precautions.   4. Follow-up.  --We will plan to see him again in 2 months, at which time we will check CBC, chemistries  and iron studies.  All questions were answered. The patient knows to call the clinic with any problems, questions or concerns. We can certainly see the patient much sooner if necessary.  I spent 10 minutes counseling the patient face to face. The total time spent in the appointment was 15 minutes.    Carlton Adam, PA-C 01/11/2014 4:30 PM

## 2014-01-11 NOTE — Telephone Encounter (Signed)
01/08/2014   RE: JAISON PETRAGLIA DOB: [birth year] MRN: [id number]   Dear Dr. Juliann Mule,    We have scheduled the above patient for an Colonoscopy and Upper Endoscopy our records show that he is on anticoagulation therapy.  Please advise as to how long the patient may come off his therapy of Agrylin prior to the procedure, which is scheduled for 02-02-2014.  Please fax back/ or route the completed form to Evette Georges., CMA    Sincerely,    Hope Pigeon

## 2014-01-11 NOTE — Telephone Encounter (Signed)
, °

## 2014-01-13 ENCOUNTER — Telehealth: Payer: Self-pay | Admitting: Internal Medicine

## 2014-01-13 NOTE — Telephone Encounter (Signed)
ADDED MD APPT FOR 10/5 PER POF 8/10 (2 MOS APPT AND 4 MOS APPT). MAILED PT REVISED APPT CALENDAR.

## 2014-01-15 ENCOUNTER — Other Ambulatory Visit: Payer: Self-pay | Admitting: Cardiology

## 2014-01-15 ENCOUNTER — Other Ambulatory Visit (HOSPITAL_COMMUNITY): Payer: Medicare Other

## 2014-01-15 NOTE — Telephone Encounter (Signed)
LMOM FOR DR. CHIM'S NURSE TO CALL BACK CHECKING ON HIS AGRYLIN CLEARANCE

## 2014-01-15 NOTE — Telephone Encounter (Signed)
Juliann Pulse from [facility] called back and said that Dr. Juliann Mule received the clearance letter and will review the chart then send letter back

## 2014-01-15 NOTE — Telephone Encounter (Signed)
This message was forwarded to Dr Juliann Mule

## 2014-01-17 NOTE — Patient Instructions (Signed)
Continue with your every 2 week lab and Aranesp injection appointments as scheduled Follow up in 2 months

## 2014-01-19 ENCOUNTER — Telehealth: Payer: Self-pay

## 2014-01-19 NOTE — Telephone Encounter (Signed)
PATIENT NOTIFIED TO HOLD AGRYLIN 7 DAYS BEFORE PROCEDURE PATIENT VERBALIZED UNDERSTANDING

## 2014-01-19 NOTE — Telephone Encounter (Signed)
[place] is asking about stopping anegelide 7 days prior to pt's procedure.

## 2014-01-19 NOTE — Telephone Encounter (Signed)
Dr Juliann Mule stated it was OK to stop the agrylin 5-7 days before the procedure. Memory Dance RN

## 2014-01-19 NOTE — Telephone Encounter (Signed)
LMOM for Juliann Pulse to call back, Dr. Boyce Medici nurse

## 2014-01-20 ENCOUNTER — Encounter: Payer: Self-pay | Admitting: Cardiology

## 2014-01-20 ENCOUNTER — Ambulatory Visit (INDEPENDENT_AMBULATORY_CARE_PROVIDER_SITE_OTHER): Payer: Medicare Other | Admitting: Cardiology

## 2014-01-20 ENCOUNTER — Encounter (HOSPITAL_COMMUNITY): Payer: Self-pay | Admitting: Pharmacy Technician

## 2014-01-20 VITALS — BP 110/50 | HR 62 | Ht 70.0 in | Wt 154.0 lb

## 2014-01-20 DIAGNOSIS — I2589 Other forms of chronic ischemic heart disease: Secondary | ICD-10-CM

## 2014-01-20 DIAGNOSIS — I2510 Atherosclerotic heart disease of native coronary artery without angina pectoris: Secondary | ICD-10-CM

## 2014-01-20 DIAGNOSIS — I255 Ischemic cardiomyopathy: Secondary | ICD-10-CM

## 2014-01-20 DIAGNOSIS — N184 Chronic kidney disease, stage 4 (severe): Secondary | ICD-10-CM

## 2014-01-20 DIAGNOSIS — N186 End stage renal disease: Secondary | ICD-10-CM

## 2014-01-20 MED ORDER — ISOSORBIDE MONONITRATE ER 30 MG PO TB24: 30.0000 mg | ORAL_TABLET | Freq: Every day | ORAL | Status: DC

## 2014-01-20 NOTE — Patient Instructions (Signed)
Start Imdur (isosorbide) 30mg  daily.   Your physician recommends that you have  lab work today--lipid profile  Your physician wants you to follow-up in: 1 year with Dr Aundra Dubin. (August 2016). You will receive a reminder letter in the mail two months in advance. If you don't receive a letter, please call our office to schedule the follow-up appointment.

## 2014-01-21 ENCOUNTER — Encounter: Payer: Medicare Other | Admitting: Vascular Surgery

## 2014-01-21 ENCOUNTER — Encounter (HOSPITAL_COMMUNITY): Payer: Self-pay | Admitting: *Deleted

## 2014-01-21 LAB — LIPID PANEL
Cholesterol: 114 mg/dL (ref 0–200)
HDL: 29.1 mg/dL — ABNORMAL LOW (ref >39.00)
LDL CALC: 68 mg/dL (ref 0–99)
NONHDL: 84.9
Total CHOL/HDL Ratio: 4
Triglycerides: 87 mg/dL (ref 0.0–149.0)
VLDL: 17.4 mg/dL (ref 0.0–40.0)

## 2014-01-21 NOTE — Progress Notes (Signed)
Patient ID: [name], male   DOB: Jul 27, 1931, 80 y.o.   MRN: [id number] PCP: Dr. Ardeth Perfect  80 yo with history of CAD s/p CABG, CKD, and essential thrombocythemia presents for cardiology followup.  He had CABG in 2006 and has had no catheterization since that time.  Soon after CABG, he had an episode of atrial flutter and was cardioverted to NSR.  He is no longer on coumadin.  He has had no documented recurrence of atrial arrhythmias.  Echo in 2/13 showed EF 45-50% with mildly dilated RV and PA systolic pressure 51 mmHg.  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  He has involuntary movements.  Sinemet did not help.  He denies exertional dyspnea.  He walks for exercise for 1-1.5 miles three times a week, rides a stationary bike, and has no problems going up a hill near his house.  No orthopnea or PND.  He continues to followup with Dr. Marval Regal for CKD.  He is now off losartan. He has had AV fistula constructed. BP controlled.  He has not had exertional chest pain.   Labs (11/12): K 4.9, creatinine 2.58 Labs (1/13): HCT 31.4, plts 168, WBC 4.3 Labs (5/14): K 5.4, creatinine 2.5 Labs (8/14): LDL 57, HDL 30 Labs (9/14): K 5.2, creatinine 2.3 Labs (1/15): HCT 31.9 Labs (8/15): K 4.9, creatinine 2.86  ECG: NSR, 1st degree AV block, LAFB, poor anterior RWP.   PMH: 1. Essential thrombocythemia: Diagnosed in 1995, followed by Dr. Ralene Ok. 2. CKD: Followed by Dr. Marval Regal. Has AV fistula. 3. Anemia of CKD: Getting Aranesp. 4. GERD 5. TKR 12/09.  6. H/o TIAs 7. HTN: ? TIAs related to amlodipine use (cannot take amlodipine).  8. Atrial flutter: Occurred soon after CABG in 2006.  He was cardioverted to NSR and has had no recurrence.  He is not on coumadin.  9. CAD s/p CABG in 2006 with LIMA-LAD, seq SVG-OM1 and OM2, SVG-RCA, SVG-D.   Last EF evaluation was by TEE in 2006 with EF 55% and trivial AI.  10. Ischemic  cardiomyopathy: Echo (2/13) with EF 45-50%, mild LVH, RV mildly dilated, PA systolic pressure 51 mmHg, moderate TR.  11. CNS degenerative disease: Not fully characterized.  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   SH: Retired from [employer], quit smoking at age 56, Prior heavy ETOH but none now.  Married, has disabled son who lives with him.   FH: Father with ? MI at age 83.   ROS: All systems reviewed and negative except as per HPI.   Current Outpatient Prescriptions  Medication Sig Dispense Refill  . acyclovir (ZOVIRAX) 200 MG capsule Take 200 mg by mouth Daily.       Marland Kitchen ALPRAZolam (XANAX) 0.25 MG tablet Take 0.25 mg by mouth 3 (three) times daily as needed for anxiety.       Marland Kitchen anagrelide (AGRYLIN) 1 MG capsule Take 1 mg by mouth daily.      . carbidopa-levodopa (SINEMET IR) 25-100 MG per tablet Take 1 tablet by mouth 3 (three) times daily.  90 tablet  6  . carboxymethylcellulose (REFRESH PLUS) 0.5 % SOLN Place 1 drop into both eyes 3 (three) times daily as needed (dry eyes).      . cloNIDine (CATAPRES) 0.3 MG tablet Take 0.3 mg by mouth 3 (three) times daily.        Marland Kitchen erythromycin ophthalmic ointment Place 1 application into the right eye 3 (three) times  daily. 10 days. Started 01/18/14      . fluticasone (FLONASE) 50 MCG/ACT nasal spray Place 1 spray into both nostrils daily as needed for allergies. As directed      . furosemide (LASIX) 40 MG tablet Take 60 mg by mouth daily.       . hydrALAZINE (APRESOLINE) 25 MG tablet take 1 tablet by mouth three times a day  90 tablet  0  . ketoconazole (NIZORAL) 2 % cream Apply 1 application topically daily as needed for irritation.       . metoprolol succinate (TOPROL-XL) 100 MG 24 hr tablet Take 100 mg by mouth 2 (two) times daily. Take with or immediately following a meal.      . MOVIPREP 100 G SOLR Take 1 kit (200 g total) by mouth once.  1 kit  0  . oxyCODONE (ROXICODONE) 5 MG immediate release tablet Take 1 tablet (5 mg  total) by mouth every 6 (six) hours as needed for severe pain.  10 tablet  0  . pantoprazole (PROTONIX) 40 MG tablet Take 40 mg by mouth daily.      . psyllium (METAMUCIL) 58.6 % powder Take 1 packet by mouth daily as needed (constipation).       . Tamsulosin HCl (FLOMAX) 0.4 MG CAPS Take 0.4 mg by mouth daily after supper.       . triamcinolone cream (KENALOG) 0.1 % Apply 1 application topically daily as needed (itching).       . zolpidem (AMBIEN) 5 MG tablet Take 5 mg by mouth at bedtime as needed for sleep.       Marland Kitchen aspirin 81 MG EC tablet Take 81 mg by mouth daily. Swallow whole.      . isosorbide mononitrate (IMDUR) 30 MG 24 hr tablet Take 1 tablet (30 mg total) by mouth daily.  90 tablet  3   No current facility-administered medications for this visit.    BP 110/50  Pulse 62  Ht $R'5\' 10"'oa$  (1.778 m)  Wt 154 lb (69.854 kg)  BMI 22.10 kg/m2 General: NAD Neck: No JVD, no thyromegaly or thyroid nodule.  Lungs: Clear to auscultation bilaterally with normal respiratory effort. CV: Nondisplaced PMI.  Heart regular S1/S2 with paradoxical S2 split, no S3/S4, no murmur.  No peripheral edema.  No carotid bruit.  Normal pedal pulses.  Abdomen: Soft, nontender, no hepatosplenomegaly, no distention.  Neurologic: Alert and oriented x 3.  Psych: Normal affect. Extremities: No clubbing or cyanosis.   Assessment/Plan: 1. CAD: s/p CABG.  No chest pain.  Continue ASA 81, statin, Toprol XL.   2. Ischemic cardiomyopathy: Mild.  EF 45-50% on last echo.  He is on Toprol XL and hydralazine.  Will add Imdur 30 mg daily to the hydralazine.  Off losartan with CKD.  He is not volume overloaded on exam.  3. CKD: Followed by Dr. Marval Regal.  Has AV fistula.   4. Hyperlipidemia:  Check lipids.  5. HTN: BP controlled.   Loralie Champagne 01/21/2014

## 2014-01-25 ENCOUNTER — Other Ambulatory Visit (HOSPITAL_BASED_OUTPATIENT_CLINIC_OR_DEPARTMENT_OTHER): Payer: Medicare Other

## 2014-01-25 ENCOUNTER — Ambulatory Visit (HOSPITAL_BASED_OUTPATIENT_CLINIC_OR_DEPARTMENT_OTHER): Payer: Medicare Other

## 2014-01-25 ENCOUNTER — Telehealth: Payer: Self-pay | Admitting: Cardiology

## 2014-01-25 VITALS — BP 142/56 | HR 64 | Temp 98.6°F

## 2014-01-25 DIAGNOSIS — N039 Chronic nephritic syndrome with unspecified morphologic changes: Secondary | ICD-10-CM

## 2014-01-25 DIAGNOSIS — N189 Chronic kidney disease, unspecified: Secondary | ICD-10-CM

## 2014-01-25 DIAGNOSIS — D631 Anemia in chronic kidney disease: Secondary | ICD-10-CM

## 2014-01-25 LAB — CBC WITH DIFFERENTIAL/PLATELET
BASO%: 0.5 % (ref 0.0–2.0)
Basophils Absolute: 0 10*3/uL (ref 0.0–0.1)
EOS%: 4.1 % (ref 0.0–7.0)
Eosinophils Absolute: 0.2 10*3/uL (ref 0.0–0.5)
HEMATOCRIT: 28.3 % — AB (ref 38.4–49.9)
HGB: 8.5 g/dL — ABNORMAL LOW (ref 13.0–17.1)
LYMPH#: 0.8 10*3/uL — AB (ref 0.9–3.3)
LYMPH%: 18.1 % (ref 14.0–49.0)
MCH: 28 pg (ref 27.2–33.4)
MCHC: 30 g/dL — ABNORMAL LOW (ref 32.0–36.0)
MCV: 93.1 fL (ref 79.3–98.0)
MONO#: 0.3 10*3/uL (ref 0.1–0.9)
MONO%: 8.2 % (ref 0.0–14.0)
NEUT#: 2.9 10*3/uL (ref 1.5–6.5)
NEUT%: 69.1 % (ref 39.0–75.0)
Platelets: 278 10*3/uL (ref 140–400)
RBC: 3.04 10*6/uL — ABNORMAL LOW (ref 4.20–5.82)
RDW: 17.8 % — ABNORMAL HIGH (ref 11.0–14.6)
WBC: 4.2 10*3/uL (ref 4.0–10.3)

## 2014-01-25 MED ORDER — DARBEPOETIN ALFA-POLYSORBATE 300 MCG/0.6ML IJ SOLN
300.0000 ug | Freq: Once | INTRAMUSCULAR | Status: AC
  Administered 2014-01-25: 300 ug via SUBCUTANEOUS
  Filled 2014-01-25: qty 0.6

## 2014-01-25 NOTE — Telephone Encounter (Signed)
New message ° ° ° ° ° °Returning a nurses call °

## 2014-01-25 NOTE — Telephone Encounter (Signed)
Spoke with patient's wife about recent lab results.

## 2014-01-26 ENCOUNTER — Ambulatory Visit (HOSPITAL_COMMUNITY)
Admission: RE | Admit: 2014-01-26 | Discharge: 2014-01-26 | Disposition: A | Discharge status: Home / Self Care | Payer: Medicare Other | Source: Ambulatory Visit | Attending: Vascular Surgery | Admitting: Vascular Surgery

## 2014-01-26 DIAGNOSIS — Z4931 Encounter for adequacy testing for hemodialysis: Secondary | ICD-10-CM | POA: Diagnosis present

## 2014-01-26 DIAGNOSIS — N186 End stage renal disease: Secondary | ICD-10-CM | POA: Diagnosis present

## 2014-01-27 ENCOUNTER — Encounter: Payer: Self-pay | Admitting: Vascular Surgery

## 2014-01-28 ENCOUNTER — Encounter: Payer: Self-pay | Admitting: Vascular Surgery

## 2014-01-28 ENCOUNTER — Ambulatory Visit (INDEPENDENT_AMBULATORY_CARE_PROVIDER_SITE_OTHER): Payer: Medicare Other | Admitting: Vascular Surgery

## 2014-01-28 VITALS — BP 133/57 | HR 51 | Ht 70.0 in | Wt 157.6 lb

## 2014-01-28 DIAGNOSIS — Z4931 Encounter for adequacy testing for hemodialysis: Secondary | ICD-10-CM

## 2014-01-28 DIAGNOSIS — N186 End stage renal disease: Secondary | ICD-10-CM

## 2014-01-28 NOTE — Progress Notes (Signed)
Patient is an 80 year old male who presents for postoperative followup after placement of a left radiocephalic AV fistula on December 01 2013.  He is not currently on dialysis. He denies any numbness or tingling in his hand.  Physical exam:  Filed Vitals:   01/28/14 1527  BP: 133/57  Pulse: 51  Height: 5\' 10"  (1.778 m)  Weight: 157 lb 9.6 oz (71.487 kg)  SpO2: 97%    Left upper extremity: Palpable thrill in fistula fistulas visible on the skin surface throughout the forearm. There is an audible bruit.  Data: Fistula is 4-5 mm in diameter less than 3 mm from the skin   Assessment: Maturing left arm AV fistula  Plan: Followup in a few months to make sure fistula maintained patency and is continuing to develop.  Duplex of AV fistula at that office visit  Ruta Hinds, MD [facility] Office: [phone] Pager: [phone]

## 2014-01-28 NOTE — Addendum Note (Signed)
Addended by: Mena Goes on: 01/28/2014 05:08 PM   Modules accepted: Orders

## 2014-02-02 ENCOUNTER — Encounter (HOSPITAL_COMMUNITY)
Admission: RE | Disposition: A | Discharge status: Home / Self Care | Payer: Self-pay | Source: Ambulatory Visit | Attending: Gastroenterology

## 2014-02-02 ENCOUNTER — Ambulatory Visit (HOSPITAL_COMMUNITY)
Admission: RE | Admit: 2014-02-02 | Discharge: 2014-02-02 | Disposition: A | Discharge status: Home / Self Care | Payer: Medicare Other | Source: Ambulatory Visit | Attending: Gastroenterology | Admitting: Gastroenterology

## 2014-02-02 ENCOUNTER — Encounter (HOSPITAL_COMMUNITY): Payer: Medicare Other | Admitting: Anesthesiology

## 2014-02-02 ENCOUNTER — Ambulatory Visit (HOSPITAL_COMMUNITY): Payer: Medicare Other | Admitting: Anesthesiology

## 2014-02-02 ENCOUNTER — Other Ambulatory Visit: Payer: Self-pay

## 2014-02-02 ENCOUNTER — Encounter (HOSPITAL_COMMUNITY): Payer: Self-pay | Admitting: *Deleted

## 2014-02-02 DIAGNOSIS — D508 Other iron deficiency anemias: Secondary | ICD-10-CM

## 2014-02-02 DIAGNOSIS — Z79899 Other long term (current) drug therapy: Secondary | ICD-10-CM | POA: Diagnosis not present

## 2014-02-02 DIAGNOSIS — Z7982 Long term (current) use of aspirin: Secondary | ICD-10-CM | POA: Diagnosis not present

## 2014-02-02 DIAGNOSIS — K208 Other esophagitis without bleeding: Secondary | ICD-10-CM | POA: Insufficient documentation

## 2014-02-02 DIAGNOSIS — I129 Hypertensive chronic kidney disease with stage 1 through stage 4 chronic kidney disease, or unspecified chronic kidney disease: Secondary | ICD-10-CM | POA: Insufficient documentation

## 2014-02-02 DIAGNOSIS — N189 Chronic kidney disease, unspecified: Secondary | ICD-10-CM | POA: Diagnosis not present

## 2014-02-02 DIAGNOSIS — I2510 Atherosclerotic heart disease of native coronary artery without angina pectoris: Secondary | ICD-10-CM | POA: Diagnosis not present

## 2014-02-02 DIAGNOSIS — D473 Essential (hemorrhagic) thrombocythemia: Secondary | ICD-10-CM | POA: Diagnosis not present

## 2014-02-02 DIAGNOSIS — D649 Anemia, unspecified: Secondary | ICD-10-CM | POA: Diagnosis present

## 2014-02-02 DIAGNOSIS — M129 Arthropathy, unspecified: Secondary | ICD-10-CM | POA: Diagnosis not present

## 2014-02-02 DIAGNOSIS — K2210 Ulcer of esophagus without bleeding: Secondary | ICD-10-CM

## 2014-02-02 DIAGNOSIS — K648 Other hemorrhoids: Secondary | ICD-10-CM | POA: Insufficient documentation

## 2014-02-02 DIAGNOSIS — K2960 Other gastritis without bleeding: Secondary | ICD-10-CM

## 2014-02-02 DIAGNOSIS — K5730 Diverticulosis of large intestine without perforation or abscess without bleeding: Secondary | ICD-10-CM | POA: Insufficient documentation

## 2014-02-02 DIAGNOSIS — Z951 Presence of aortocoronary bypass graft: Secondary | ICD-10-CM | POA: Diagnosis not present

## 2014-02-02 DIAGNOSIS — K219 Gastro-esophageal reflux disease without esophagitis: Secondary | ICD-10-CM | POA: Diagnosis not present

## 2014-02-02 DIAGNOSIS — Z96659 Presence of unspecified artificial knee joint: Secondary | ICD-10-CM | POA: Insufficient documentation

## 2014-02-02 DIAGNOSIS — R195 Other fecal abnormalities: Secondary | ICD-10-CM

## 2014-02-02 HISTORY — PX: ESOPHAGOGASTRODUODENOSCOPY: SHX5428

## 2014-02-02 HISTORY — PX: COLONOSCOPY: SHX5424

## 2014-02-02 HISTORY — DX: Angina pectoris, unspecified: I20.9

## 2014-02-02 SURGERY — COLONOSCOPY
Anesthesia: Monitor Anesthesia Care

## 2014-02-02 MED ORDER — LIDOCAINE HCL (CARDIAC) 20 MG/ML IV SOLN
INTRAVENOUS | Status: AC
  Filled 2014-02-02: qty 5

## 2014-02-02 MED ORDER — PROPOFOL 10 MG/ML IV BOLUS
INTRAVENOUS | Status: AC
  Filled 2014-02-02: qty 20

## 2014-02-02 MED ORDER — LACTATED RINGERS IV SOLN
INTRAVENOUS | Status: DC
  Administered 2014-02-02: 1000 mL via INTRAVENOUS

## 2014-02-02 MED ORDER — GLYCOPYRROLATE 0.2 MG/ML IJ SOLN
INTRAMUSCULAR | Status: DC | PRN
  Administered 2014-02-02: 0.2 mg via INTRAVENOUS

## 2014-02-02 MED ORDER — FENTANYL CITRATE 0.05 MG/ML IJ SOLN
INTRAMUSCULAR | Status: AC
  Filled 2014-02-02: qty 2

## 2014-02-02 MED ORDER — SODIUM CHLORIDE 0.9 % IV SOLN: INTRAVENOUS | Status: DC

## 2014-02-02 MED ORDER — BUTAMBEN-TETRACAINE-BENZOCAINE 2-2-14 % EX AERO
INHALATION_SPRAY | CUTANEOUS | Status: DC | PRN
  Administered 2014-02-02: 1 via TOPICAL

## 2014-02-02 MED ORDER — FENTANYL CITRATE 0.05 MG/ML IJ SOLN
INTRAMUSCULAR | Status: DC | PRN
  Administered 2014-02-02: 50 ug via INTRAVENOUS

## 2014-02-02 MED ORDER — LIDOCAINE HCL 1 % IJ SOLN
INTRAMUSCULAR | Status: DC | PRN
  Administered 2014-02-02: 40 mg via INTRADERMAL

## 2014-02-02 MED ORDER — PROPOFOL INFUSION 10 MG/ML OPTIME
INTRAVENOUS | Status: DC | PRN
  Administered 2014-02-02: 100 ug/kg/min via INTRAVENOUS

## 2014-02-02 NOTE — Anesthesia Postprocedure Evaluation (Signed)
Anesthesia Post Note  Patient: [name]  Procedure(s) Performed: Procedure(s) (LRB): COLONOSCOPY (N/A) ESOPHAGOGASTRODUODENOSCOPY (EGD) (N/A)  Anesthesia type: MAC  Patient location: PACU  Post pain: Pain level controlled  Post assessment: Post-op Vital signs reviewed  Last Vitals: BP 138/44  Temp(Src) 36.4 C (Oral)  Resp 17  SpO2 100%  Post vital signs: Reviewed  Level of consciousness: awake  Complications: No apparent anesthesia complications

## 2014-02-02 NOTE — Discharge Instructions (Signed)
Colonoscopy, Care After °These instructions give you information on caring for yourself after your procedure. Your doctor may also give you more specific instructions. Call your doctor if you have any problems or questions after your procedure. °HOME CARE °· Do not drive for 24 hours. °· Do not sign important papers or use machinery for 24 hours. °· You may shower. °· You may go back to your usual activities, but go slower for the first 24 hours. °· Take rest breaks often during the first 24 hours. °· Walk around or use warm packs on your belly (abdomen) if you have belly cramping or gas. °· Drink enough fluids to keep your pee (urine) clear or pale yellow. °· Resume your normal diet. Avoid heavy or fried foods. °· Avoid drinking alcohol for 24 hours or as told by your doctor. °· Only take medicines as told by your doctor. °If a tissue sample (biopsy) was taken during the procedure:  °· Do not take aspirin or blood thinners for 7 days, or as told by your doctor. °· Do not drink alcohol for 7 days, or as told by your doctor. °· Eat soft foods for the first 24 hours. °GET HELP IF: °You still have a small amount of blood in your poop (stool) 2-3 days after the procedure. °GET HELP RIGHT AWAY IF: °· You have more than a small amount of blood in your poop. °· You see clumps of tissue (blood clots) in your poop. °· Your belly is puffy (swollen). °· You feel sick to your stomach (nauseous) or throw up (vomit). °· You have a fever. °· You have belly pain that gets worse and medicine does not help. °MAKE SURE YOU: °· Understand these instructions. °· Will watch your condition. °· Will get help right away if you are not doing well or get worse. °Document Released: 06/23/2010 Document Revised: 05/26/2013 Document Reviewed: 01/26/2013 °Exit[employer]® Patient Information ©2015 [employer]. This information is not intended to replace advice given to you by your health care provider. Make sure you discuss any questions you have with  your health care provider. ° °

## 2014-02-02 NOTE — Op Note (Signed)
[facility] [place], [address]   COLONOSCOPY PROCEDURE REPORT  PATIENT: [name], Bryan  MR#: [id number] BIRTHDATE: 1931-08-03 , 82  yrs. old GENDER: Male ENDOSCOPIST: Inda Castle, MD REFERRED BY: PROCEDURE DATE:  02/02/2014 PROCEDURE:   Colonoscopy, diagnostic ASA CLASS:   Class III INDICATIONS:Anemia, non-specific and occult blood . MEDICATIONS: MAC sedation, administered by CRNA  DESCRIPTION OF PROCEDURE:   After the risks benefits and alternatives of the procedure were thoroughly explained, informed consent was obtained.  A digital rectal exam revealed no abnormalities of the rectum.   The     endoscope was introduced through the anus and advanced to the cecum, which was identified by the ileocecal valve. No adverse events experienced.   Limited by poor preparation.   .  There was a large amount of thick liquid stool and solid stool 13 detail examination of the mucosa.  The cecum could not be cleared to identify the appendiceal orifice. The quality of the prep was Suprep poor  The instrument was then slowly withdrawn as the colon was fully examined.      COLON FINDINGS: Internal hemorrhoids were found.   Moderate diverticulosis was noted in the sigmoid colon.   The colon mucosa was otherwise normal.  Retroflexed views revealed no abnormalities. The time to cecum=  .  Withdrawal time=10 minutes 0 seconds.  The scope was withdrawn and the procedure completed. COMPLICATIONS: There were no complications.  ENDOSCOPIC IMPRESSION: 1.   Internal hemorrhoids 2.   Moderate diverticulosis was noted in the sigmoid colon 3.   The colon mucosa was otherwise normal 4.  Limited colonoscopy due to poor prep  RECOMMENDATIONS: EGD  eSigned:  Inda Castle, MD 02/02/2014 11:00 AM   cc: Concha Norway, MD   PATIENT NAME:  Bryan, [name] MR#: [id number]

## 2014-02-02 NOTE — H&P (Signed)
HISTORY OF PRESENT ILLNESS: This is an 80 year old male  He had an EGD in 10/2007 at which time he was found to have a hiatal hernia and an esophageal stricture that was dilated. He also had a nodule in the antrum of the stomach that had central umbilication consistent with pancreatic rest. His last colonoscopy was in May 2006 at which time he was found to have internal hemorrhoids and erythema from the sigmoid to the proximal colon; biopsies were consistent with resolving self-limiting colitis. He has past medical history of chronic kidney disease with fistula placement, however, not yet on dialysis. Coronary artery disease status post CABG in 2006, and thrombocythemia for which he follows with hematology, Dr. Rosie Fate, and is on anagrelide for that. Also has a chronic anemia, which has been thought to be due to his chronic kidney disease. Hemoglobin tends to run in the high 8-9 g range. He does receive Aranesp for that as well. Recently, however, his hemoglobin dropped to 6.7 grams last month. He actually received 2 transfusions of packed red blood cells, 2 units on each occasion with a total of 4 units. Also got IV iron. Most recent hemoglobin is week was 7.9 g and he is due to have it repeated next week. He was found to be heme-positive on 3 occasions, therefore, was sent here for further evaluation from GI standpoint. He denies any red blood in his stool. He said that recently while doing the stool tests his stools were dark in color, however. He does admit to a lot of constipation for which he takes MiraLax and milk of magnesia with relief. Recent ferritin was low at 12, iron low at 19, percent saturation low at 6, TIBC normal at 329.  Past Medical History   Diagnosis  Date   .  Chronic kidney disease    .  Thrombocythemia    .  Esophageal stricture    .  Anemia    .  Anemia due to chronic renal failure treated with erythropoietin  03/24/2011   .  Retinal vein thrombosis, left    .  Coronary artery  disease    .  Hypertension    .  GERD (gastroesophageal reflux disease)    .  Arthritis     Past Surgical History   Procedure  Laterality  Date   .  Coronary artery bypass graft   09/13/04   .  Replacement total knee  Right  04/19/08   .  Inguinal hernia repair  Right  05/08/01   .  Eye surgery     .  Av fistula placement  Left  12/01/2013     Procedure: RADIOCEPHALIC ARTERIOVENOUS (AV) FISTULA CREATION; Surgeon: Sherren Kerns, MD; Location: [facility] OR; Service: Vascular; Laterality: Left;   reports that he quit smoking about 35 years ago. His smoking use included Cigarettes. He smoked 0.00 packs per day. He has quit using smokeless tobacco. His smokeless tobacco use included Chew. He reports that he does not drink alcohol or use illicit drugs.  family history includes Angina in his father; Dementia in his mother.  Allergies   Allergen  Reactions   .  Norvasc [Amlodipine Besylate]  Other (See Comments)     TIA's    Outpatient Encounter Prescriptions as of 01/08/2014   Medication  Sig   .  acyclovir (ZOVIRAX) 200 MG capsule  Take 200 mg by mouth Daily.   Marland Kitchen  ALPRAZolam (XANAX) 0.25 MG tablet  Take 0.25 mg by  mouth 3 (three) times daily as needed for anxiety.   Marland Kitchen  anagrelide (AGRYLIN) 1 MG capsule  Take 1 mg by mouth daily.   Marland Kitchen  anagrelide (AGRYLIN) 1 MG capsule  take 1 capsule by mouth daily or as directed   .  aspirin 81 MG EC tablet  Take 81 mg by mouth daily. Swallow whole.   .  carbidopa-levodopa (SINEMET IR) 25-100 MG per tablet  Take 1 tablet by mouth 3 (three) times daily.   .  carboxymethylcellulose (REFRESH PLUS) 0.5 % SOLN  Place 1 drop into both eyes 3 (three) times daily as needed (dry eyes).   .  cloNIDine (CATAPRES) 0.3 MG tablet  Take 0.3 mg by mouth 3 (three) times daily.   .  fluticasone (FLONASE) 50 MCG/ACT nasal spray  Place 1 spray into both nostrils daily as needed for allergies. As directed   .  furosemide (LASIX) 40 MG tablet  Take 60 mg by mouth daily.   .  hydrALAZINE  (APRESOLINE) 25 MG tablet  Take 1 tablet (25 mg total) by mouth 3 (three) times daily.   Marland Kitchen  ketoconazole (NIZORAL) 2 % cream  Apply 1 application topically daily as needed for irritation.   .  metoprolol succinate (TOPROL-XL) 100 MG 24 hr tablet  TAKE 1 TABLET BY MOUTH 2 TIMES DAILY OR WITH IMMEDIATELY AFTER A MEAL   .  oxyCODONE (ROXICODONE) 5 MG immediate release tablet  Take 1 tablet (5 mg total) by mouth every 6 (six) hours as needed for severe pain.   .  pantoprazole (PROTONIX) 40 MG tablet  Take 40 mg by mouth daily.   .  psyllium (METAMUCIL) 58.6 % powder  Take 1 packet by mouth daily as needed (constipation).   .  Tamsulosin HCl (FLOMAX) 0.4 MG CAPS  Take 0.4 mg by mouth daily after supper.   .  triamcinolone cream (KENALOG) 0.1 %  Apply 1 application topically daily as needed (itching).   .  zolpidem (AMBIEN) 5 MG tablet  Take 5 mg by mouth at bedtime as needed for sleep.   Marland Kitchen  MOVIPREP 100 G SOLR  Take 1 kit (200 g total) by mouth once.   REVIEW OF SYSTEMS : All other systems reviewed and negative except where noted in the History of Present Illness.  PHYSICAL EXAM:  BP 148/52  Pulse 62  Ht $R'5\' 10"'EW$  (1.778 m)  Wt 154 lb (69.854 kg)  BMI 22.10 kg/m2  General: Well developed white male in no acute distress  Head: Normocephalic and atraumatic  Eyes: Sclerae anicteric, conjunctiva pink.  Ears: HOH  Lungs: Clear throughout to auscultation  Heart: Regular rate and rhythm  Abdomen: Soft, non-distended. Normal bowel sounds. Non-tender.  Rectal: Deferred. Will be done at the time of colonoscopy.  Musculoskeletal: Symmetrical with no gross deformities  Skin: No lesions on visible extremities  Extremities: No edema  Neurological: Alert oriented x 4, grossly non-focal  Psychological: Alert and cooperative. Normal mood and affect   ASSESSMENT AND PLAN:  -Anemia: Likely multifactorial. Thought chronically to be from CKD and receives aranesp, but recent decrease in Hgb with heme positive  stool x 3. No overt bleeding. Recently received 4 units PRBC's and IV iron.  -Thrombocythemia: On anagrelide per hematology.  *Will schedule EGD and colonoscopy for further evaluation.  *Hgb being monitored by hematology.  The risks, benefits, and alternatives were discussed with the patient and he consents to proceed. The risks benefits and alternatives to a temporary hold of  anti-coagulants/anti-platelets for the procedure were discussed with the patient he consents to proceed.

## 2014-02-02 NOTE — Anesthesia Preprocedure Evaluation (Addendum)
Anesthesia Evaluation  Patient identified by MRN, date of birth, ID band Patient awake    Reviewed: Allergy & Precautions, H&P , NPO status , Patient's Chart, lab work & pertinent test results  Airway Mallampati: I  Neck ROM: Full    Dental  (+) Edentulous Upper, Partial Lower, Dental Advisory Given   Pulmonary former smoker,  breath sounds clear to auscultation        Cardiovascular hypertension, Pt. on medications + angina + CAD, + Peripheral Vascular Disease and +CHF + Valvular Problems/Murmurs Rhythm:Regular Rate:Normal + Systolic murmurs    Neuro/Psych negative neurological ROS  negative psych ROS   GI/Hepatic GERD-  ,  Endo/Other  negative endocrine ROS  Renal/GU ESRF, Renal Insufficiency and CRFRenal disease     Musculoskeletal  (+) Arthritis -, Osteoarthritis,    Abdominal   Peds  Hematology  (+) anemia ,   Anesthesia Other Findings   Reproductive/Obstetrics                          Anesthesia Physical  Anesthesia Plan  ASA: III  Anesthesia Plan: MAC   Post-op Pain Management:    Induction: Intravenous  Airway Management Planned: Natural Airway and Simple Face Mask  Additional Equipment:   Intra-op Plan:   Post-operative Plan:   Informed Consent: I have reviewed the patients History and Physical, chart, labs and discussed the procedure including the risks, benefits and alternatives for the proposed anesthesia with the patient or authorized representative who has indicated his/her understanding and acceptance.     Plan Discussed with: CRNA  Anesthesia Plan Comments:        Anesthesia Quick Evaluation

## 2014-02-02 NOTE — Transfer of Care (Signed)
Immediate Anesthesia Transfer of Care Note  Patient: [name]  Procedure(s) Performed: Procedure(s): COLONOSCOPY (N/A) ESOPHAGOGASTRODUODENOSCOPY (EGD) (N/A)  Patient Location: PACU and Endoscopy Unit  Anesthesia Type:MAC  Level of Consciousness: awake, alert , oriented and patient cooperative  Airway & Oxygen Therapy: Patient Spontanous Breathing and Patient connected to nasal cannula oxygen  Post-op Assessment: Report given to PACU RN and Post -op Vital signs reviewed and stable  Post vital signs: Reviewed and stable  Complications: No apparent anesthesia complications

## 2014-02-02 NOTE — Op Note (Signed)
[place], [id number]   ENDOSCOPY PROCEDURE REPORT  PATIENT: Bryan, [name]  MR#: [id number] BIRTHDATE: 09-Aug-1931 , 82  yrs. old GENDER: Male ENDOSCOPIST: Inda Castle, MD REFERRED BY: PROCEDURE DATE:  02/02/2014 PROCEDURE:  EGD w/ biopsy ASA CLASS:     Class III INDICATIONS:  Anemia.   Occult blood positive. MEDICATIONS: There was residual sedation effect present from prior procedure and MAC sedation, administered by CRNA TOPICAL ANESTHETIC:  DESCRIPTION OF PROCEDURE: After the risks benefits and alternatives of the procedure were thoroughly explained, informed consent was obtained.  The Pentax Gastroscope [id number] endoscope was introduced through the mouth and advanced to the third portion of the duodenum. Without limitations.  The instrument was slowly withdrawn as the mucosa was fully examined.      There was grade 4 erosive esophagitis in the area of the GE junction.  There is marked edema and erythema on the gastric side of the GE junction.  Biopsies were taken.  There was no fresh or old blood the.   The remainder of the upper endoscopy exam was otherwise normal.  Retroflexed views revealed no abnormalities. The scope was then withdrawn from the patient and the procedure completed.  COMPLICATIONS: There were no complications. ENDOSCOPIC IMPRESSION: 1.   grade 4 erosive esophagitis  Findings could explain Hemoccult-positive stool but less likely as a cause for her anemia  RECOMMENDATIONS: 1.  increase Protonix to 40 mg twice a day for 2 weeks and then daily 2.  CBC in 4 weeks 3.  Hemoccults in 4-6 weeks 4.  office visit 6 weeks REPEAT EXAM:  eSigned:  Inda Castle, MD 02/02/2014 11:05 AM   CC: Concha Norway, MD

## 2014-02-03 ENCOUNTER — Encounter (HOSPITAL_COMMUNITY): Payer: Self-pay | Admitting: Gastroenterology

## 2014-02-03 ENCOUNTER — Telehealth: Payer: Self-pay | Admitting: Gastroenterology

## 2014-02-03 NOTE — Telephone Encounter (Signed)
No specific therapy is required.

## 2014-02-03 NOTE — Telephone Encounter (Signed)
Spoke with Mrs Brill. She will encourage PO intake and make sure he is safe from falls. Further concerns, she will call EMS or take him to the ER.

## 2014-02-03 NOTE — Telephone Encounter (Signed)
Spoke with the patient and a male care partner. Patient felt cold this morning. He took a pain pill that has Tylenol in it. He has not had any further chills. He is eating solids, drinking fluids and tolerating both. No nausea or vomiting. No bleeding from anywhere. His complaint is that he feels "zapped" or tired. Admits he feels only a little more than his usual feeling of fatigue. He wants to be certain this is expected and to know if there is anything he can do to get his energy back more quickly. Please advise.

## 2014-02-05 ENCOUNTER — Encounter: Payer: Self-pay | Admitting: Gastroenterology

## 2014-02-09 ENCOUNTER — Other Ambulatory Visit (HOSPITAL_BASED_OUTPATIENT_CLINIC_OR_DEPARTMENT_OTHER): Payer: Medicare Other

## 2014-02-09 ENCOUNTER — Telehealth: Payer: Self-pay | Admitting: *Deleted

## 2014-02-09 ENCOUNTER — Ambulatory Visit (HOSPITAL_BASED_OUTPATIENT_CLINIC_OR_DEPARTMENT_OTHER): Payer: Medicare Other

## 2014-02-09 VITALS — BP 123/49 | HR 52 | Temp 97.4°F

## 2014-02-09 DIAGNOSIS — Z23 Encounter for immunization: Secondary | ICD-10-CM

## 2014-02-09 DIAGNOSIS — N189 Chronic kidney disease, unspecified: Secondary | ICD-10-CM

## 2014-02-09 DIAGNOSIS — N039 Chronic nephritic syndrome with unspecified morphologic changes: Secondary | ICD-10-CM

## 2014-02-09 DIAGNOSIS — D631 Anemia in chronic kidney disease: Secondary | ICD-10-CM

## 2014-02-09 DIAGNOSIS — N183 Chronic kidney disease, stage 3 (moderate): Principal | ICD-10-CM

## 2014-02-09 LAB — CBC WITH DIFFERENTIAL/PLATELET
BASO%: 1.3 % (ref 0.0–2.0)
Basophils Absolute: 0.1 10*3/uL (ref 0.0–0.1)
EOS ABS: 0.1 10*3/uL (ref 0.0–0.5)
EOS%: 2.6 % (ref 0.0–7.0)
HCT: 26.3 % — ABNORMAL LOW (ref 38.4–49.9)
HGB: 8.1 g/dL — ABNORMAL LOW (ref 13.0–17.1)
LYMPH#: 0.4 10*3/uL — AB (ref 0.9–3.3)
LYMPH%: 9.5 % — ABNORMAL LOW (ref 14.0–49.0)
MCH: 27 pg — ABNORMAL LOW (ref 27.2–33.4)
MCHC: 30.7 g/dL — ABNORMAL LOW (ref 32.0–36.0)
MCV: 87.9 fL (ref 79.3–98.0)
MONO#: 0.5 10*3/uL (ref 0.1–0.9)
MONO%: 10.8 % (ref 0.0–14.0)
NEUT#: 3.3 10*3/uL (ref 1.5–6.5)
NEUT%: 75.8 % — ABNORMAL HIGH (ref 39.0–75.0)
Platelets: 240 10*3/uL (ref 140–400)
RBC: 2.99 10*6/uL — AB (ref 4.20–5.82)
RDW: 17.5 % — AB (ref 11.0–14.6)
WBC: 4.3 10*3/uL (ref 4.0–10.3)

## 2014-02-09 MED ORDER — INFLUENZA VAC SPLIT QUAD 0.5 ML IM SUSY
0.5000 mL | PREFILLED_SYRINGE | Freq: Once | INTRAMUSCULAR | Status: AC
  Administered 2014-02-09: 0.5 mL via INTRAMUSCULAR
  Filled 2014-02-09: qty 0.5

## 2014-02-09 MED ORDER — DARBEPOETIN ALFA-POLYSORBATE 300 MCG/0.6ML IJ SOLN
300.0000 ug | Freq: Once | INTRAMUSCULAR | Status: AC
  Administered 2014-02-09: 300 ug via SUBCUTANEOUS
  Filled 2014-02-09: qty 0.6

## 2014-02-09 NOTE — Telephone Encounter (Signed)
Mailed disability parking permit signed by Dr. Lona Kettle to pt's home today.

## 2014-02-12 ENCOUNTER — Other Ambulatory Visit: Payer: Self-pay | Admitting: Cardiology

## 2014-02-15 ENCOUNTER — Telehealth: Payer: Self-pay | Admitting: Gastroenterology

## 2014-02-15 MED ORDER — PANTOPRAZOLE SODIUM 40 MG PO TBEC: 40.0000 mg | DELAYED_RELEASE_TABLET | Freq: Every day | ORAL | Status: DC

## 2014-02-15 NOTE — Telephone Encounter (Signed)
Refilled Protonix 

## 2014-02-22 ENCOUNTER — Ambulatory Visit (HOSPITAL_BASED_OUTPATIENT_CLINIC_OR_DEPARTMENT_OTHER): Payer: Medicare Other

## 2014-02-22 ENCOUNTER — Other Ambulatory Visit (HOSPITAL_BASED_OUTPATIENT_CLINIC_OR_DEPARTMENT_OTHER): Payer: Medicare Other

## 2014-02-22 VITALS — BP 138/51 | HR 55 | Temp 97.6°F

## 2014-02-22 DIAGNOSIS — D631 Anemia in chronic kidney disease: Secondary | ICD-10-CM

## 2014-02-22 DIAGNOSIS — N189 Chronic kidney disease, unspecified: Secondary | ICD-10-CM

## 2014-02-22 DIAGNOSIS — N183 Chronic kidney disease, stage 3 (moderate): Principal | ICD-10-CM

## 2014-02-22 DIAGNOSIS — N039 Chronic nephritic syndrome with unspecified morphologic changes: Secondary | ICD-10-CM

## 2014-02-22 LAB — CBC WITH DIFFERENTIAL/PLATELET
BASO%: 0.6 % (ref 0.0–2.0)
Basophils Absolute: 0 10*3/uL (ref 0.0–0.1)
EOS ABS: 0.1 10*3/uL (ref 0.0–0.5)
EOS%: 4.5 % (ref 0.0–7.0)
HEMATOCRIT: 26.2 % — AB (ref 38.4–49.9)
HEMOGLOBIN: 8.1 g/dL — AB (ref 13.0–17.1)
LYMPH%: 16.8 % (ref 14.0–49.0)
MCH: 27.3 pg (ref 27.2–33.4)
MCHC: 30.9 g/dL — ABNORMAL LOW (ref 32.0–36.0)
MCV: 88.2 fL (ref 79.3–98.0)
MONO#: 0.4 10*3/uL (ref 0.1–0.9)
MONO%: 13.2 % (ref 0.0–14.0)
NEUT%: 64.9 % (ref 39.0–75.0)
NEUTROS ABS: 2 10*3/uL (ref 1.5–6.5)
PLATELETS: 183 10*3/uL (ref 140–400)
RBC: 2.97 10*6/uL — AB (ref 4.20–5.82)
RDW: 16.6 % — ABNORMAL HIGH (ref 11.0–14.6)
WBC: 3.1 10*3/uL — ABNORMAL LOW (ref 4.0–10.3)
lymph#: 0.5 10*3/uL — ABNORMAL LOW (ref 0.9–3.3)

## 2014-02-22 MED ORDER — DARBEPOETIN ALFA-POLYSORBATE 300 MCG/0.6ML IJ SOLN
300.0000 ug | Freq: Once | INTRAMUSCULAR | Status: AC
  Administered 2014-02-22: 300 ug via SUBCUTANEOUS
  Filled 2014-02-22: qty 0.6

## 2014-02-24 ENCOUNTER — Telehealth: Payer: Self-pay | Admitting: Gastroenterology

## 2014-02-24 NOTE — Telephone Encounter (Signed)
Patient took his Protonix BID as he was directed at his discharge.(6 week EGD follow up-grade 4 erosive esophagitis) He had been symptoms free during the 2 weeks of medication. Returned to daily dosing on Sunday. Symptoms of reflux and burning returned in full yesterday. He has a queasy feeling in his stomach today. He will take a second dosing of Protonix today. Please advise.

## 2014-02-25 ENCOUNTER — Ambulatory Visit: Payer: Medicare Other | Admitting: Physician Assistant

## 2014-02-25 MED ORDER — DEXAMETHASONE SODIUM PHOSPHATE 10 MG/ML IJ SOLN
INTRAMUSCULAR | Status: AC
  Filled 2014-02-25: qty 1

## 2014-02-25 MED ORDER — ONDANSETRON 8 MG/NS 50 ML IVPB
INTRAVENOUS | Status: AC
  Filled 2014-02-25: qty 8

## 2014-02-25 NOTE — Telephone Encounter (Signed)
Okay to stay on twice a day Protonix

## 2014-02-25 NOTE — Telephone Encounter (Signed)
Spoke with the patient and a male in the home. He had a better night last night as far as GI symptoms. He will continue BID dosing. Prior authorization form faxed to his insurance in anticipation of a quantity limitation. Patient expresses understanding.

## 2014-03-01 ENCOUNTER — Other Ambulatory Visit (INDEPENDENT_AMBULATORY_CARE_PROVIDER_SITE_OTHER): Payer: Medicare Other

## 2014-03-01 DIAGNOSIS — D508 Other iron deficiency anemias: Secondary | ICD-10-CM

## 2014-03-01 LAB — CBC WITH DIFFERENTIAL/PLATELET
Basophils Absolute: 0 10*3/uL (ref 0.0–0.1)
Basophils Relative: 0.2 % (ref 0.0–3.0)
Eosinophils Absolute: 0.2 10*3/uL (ref 0.0–0.7)
Eosinophils Relative: 4.4 % (ref 0.0–5.0)
Hemoglobin: 7.9 g/dL — CL (ref 13.0–17.0)
LYMPHS ABS: 0.5 10*3/uL — AB (ref 0.7–4.0)
Lymphocytes Relative: 14.5 % (ref 12.0–46.0)
MCHC: 31.7 g/dL (ref 30.0–36.0)
MCV: 86.2 fl (ref 78.0–100.0)
MONO ABS: 0.4 10*3/uL (ref 0.1–1.0)
Monocytes Relative: 11.4 % (ref 3.0–12.0)
Neutro Abs: 2.5 10*3/uL (ref 1.4–7.7)
Neutrophils Relative %: 69.5 % (ref 43.0–77.0)
Platelets: 213 10*3/uL (ref 150.0–400.0)
RBC: 2.89 Mil/uL — ABNORMAL LOW (ref 4.22–5.81)
RDW: 17.9 % — ABNORMAL HIGH (ref 11.5–15.5)
WBC: 3.7 10*3/uL — AB (ref 4.0–10.5)

## 2014-03-02 ENCOUNTER — Other Ambulatory Visit (INDEPENDENT_AMBULATORY_CARE_PROVIDER_SITE_OTHER): Payer: Medicare Other

## 2014-03-02 DIAGNOSIS — D508 Other iron deficiency anemias: Secondary | ICD-10-CM

## 2014-03-02 LAB — HEMOCCULT SLIDES (X 3 CARDS)
Fecal Occult Blood: NEGATIVE
OCCULT 1: NEGATIVE
OCCULT 2: NEGATIVE
OCCULT 3: NEGATIVE
OCCULT 4: NEGATIVE
OCCULT 5: NEGATIVE

## 2014-03-03 ENCOUNTER — Encounter: Payer: Self-pay | Admitting: Physician Assistant

## 2014-03-03 ENCOUNTER — Ambulatory Visit (INDEPENDENT_AMBULATORY_CARE_PROVIDER_SITE_OTHER): Payer: Medicare Other | Admitting: Physician Assistant

## 2014-03-03 VITALS — BP 130/52 | HR 70 | Ht 70.5 in | Wt 161.4 lb

## 2014-03-03 DIAGNOSIS — I2589 Other forms of chronic ischemic heart disease: Secondary | ICD-10-CM

## 2014-03-03 DIAGNOSIS — F1910 Other psychoactive substance abuse, uncomplicated: Secondary | ICD-10-CM

## 2014-03-03 DIAGNOSIS — R259 Unspecified abnormal involuntary movements: Secondary | ICD-10-CM

## 2014-03-03 DIAGNOSIS — IMO0002 Reserved for concepts with insufficient information to code with codable children: Secondary | ICD-10-CM

## 2014-03-03 DIAGNOSIS — R0989 Other specified symptoms and signs involving the circulatory and respiratory systems: Secondary | ICD-10-CM

## 2014-03-03 DIAGNOSIS — I2510 Atherosclerotic heart disease of native coronary artery without angina pectoris: Secondary | ICD-10-CM

## 2014-03-03 DIAGNOSIS — I255 Ischemic cardiomyopathy: Secondary | ICD-10-CM

## 2014-03-03 DIAGNOSIS — R0609 Other forms of dyspnea: Secondary | ICD-10-CM | POA: Insufficient documentation

## 2014-03-03 NOTE — Assessment & Plan Note (Signed)
Patient has worsening of his dyspnea on exertion since his last office visit with Dr. Algernon Huxley. With history of CABG and nonspecific changes on his EKG I have recommended Lexi scan Myoview. When I refused to fill the patient's oxycodone prescriptions the patient refused any further workup or therapy and walked out. I asked the patient to contact those who prescribed his oxycodone for refills. Followup with Dr. Algernon Huxley.

## 2014-03-03 NOTE — Patient Instructions (Signed)
Your physician recommends that you continue on your current medications as directed. Please refer to the Current Medication list given to you today.   MAKE  APPT WITH YOU NEUROLOGIST   STOP ISORBIDE

## 2014-03-03 NOTE — Assessment & Plan Note (Addendum)
Prior CABG and ischemic cardiomyopathy with worsening dyspnea on exertion. Imdur were added by Dr. Aundra Dubin last office visit the patient developed headache and claims it caused a blood clot. Stopped Imdur. Followup with neurology.

## 2014-03-03 NOTE — Assessment & Plan Note (Signed)
Patient continues to have a lot of neurological complaints. I recommend he revisit his neurologist.

## 2014-03-03 NOTE — Assessment & Plan Note (Signed)
Patient brought in for bottles of oxycodone and wants them all filled at a higher dose of 500 mg. He says it's silly thing that makes him feel better and gives him energy. I asked him to contact prescribers of his medication and refused to fill these prescriptions. At this point he refused any further cardiac workup.

## 2014-03-03 NOTE — Progress Notes (Signed)
[name] is an 80 yo patient of Dr. Aundra Dubin with history of CAD s/p CABG, CKD, and essential thrombocythemia presents for cardiology followup.  He had CABG in 2006 and has had no catheterization since that time.  Soon after CABG, he had an episode of atrial flutter and was cardioverted to NSR.  He is no longer on coumadin.  He has had no documented recurrence of atrial arrhythmias.  Echo in 2/13 showed EF 45-50% with mildly dilated RV and PA systolic pressure 51 mmHg.  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  He has involuntary movements.  Sinemet did not help. He saw Dr. Aundra Dubin on 01/20/14 and was doing well walking for exercise for 1-1.5 miles three times a week, rides a stationary bike, and has no problems going up a hill near his house.  No orthopnea or PND.  He continues to followup with Dr. Marval Regal for CKD.  He is now off losartan. He has had AV fistula constructed. BP controlled.  He has not had exertional chest pain. Imdur was added for his ischemic cardiomyopathy.  Patient claimed he took the isosorbide for 2 weeks and then developed a headache. He then had an episode where he walked out in his yard and fell over backwards. He says he couldn't get up and had to pull himself up by the chain link fence. He says he was short of breath trying to get himself up. Since then he does complain of dyspnea on exertion with little activity. He thinks the isosorbide caused a blood clot in his brain. He says his mind and body aren't working together. His mind wants to do things but his body is not able. He saw a neurologist back in January but did not follow up since then. He did not seek medical attention after this fall. He also had a recent endoscopy showing grade 4 erosive esophagitis. He is chronically anemic(also from CKD) and sometimes requires transfusions. His last hemoglobin was 8.1 on 02/22/14. He is asking to  take Coumadin because he thinks he has blood clots.  Patient brought 4 bottles of empty oxycodone with him. He asked me to fill each one of them and give him a higher dose of 500 mg. He says this makes him feel better and gives him energy. When I told him I would not refill these and he needed to contact the doctor that prescribed it he refused any further workup for his dyspnea. I recommended Lexi scan Myoview and neurology followup but he is refusing anything since I won't fill the oxycodone.    Allergies  Allergen Reactions  . Isosorbide Other (See Comments)    Caused a blood clot  . Norvasc [Amlodipine Besylate] Other (See Comments)    TIA's     Current Outpatient Prescriptions  Medication Sig Dispense Refill  . acyclovir (ZOVIRAX) 200 MG capsule Take 200 mg by mouth Daily.       Marland Kitchen ALPRAZolam (XANAX) 0.25 MG tablet Take 0.25 mg by mouth 3 (three) times daily as needed for anxiety.       Marland Kitchen anagrelide (AGRYLIN) 1 MG capsule Take 1 mg by mouth daily.      Marland Kitchen aspirin 81 MG EC tablet Take 81 mg by mouth daily. Swallow whole.      . carbidopa-levodopa (SINEMET IR) 25-100 MG per tablet Take 1 tablet by mouth 3 (three) times daily.  90 tablet  6  .  carboxymethylcellulose (REFRESH PLUS) 0.5 % SOLN Place 1 drop into both eyes 3 (three) times daily as needed (dry eyes).      . cloNIDine (CATAPRES) 0.3 MG tablet Take 0.3 mg by mouth 3 (three) times daily.        . fluticasone (FLONASE) 50 MCG/ACT nasal spray Place 1 spray into both nostrils daily as needed for allergies. As directed      . furosemide (LASIX) 40 MG tablet Take 60 mg by mouth daily.       . hydrALAZINE (APRESOLINE) 25 MG tablet take 1 tablet by mouth three times a day  90 tablet  10  . ketoconazole (NIZORAL) 2 % cream Apply 1 application topically daily as needed for irritation.       . metoprolol succinate (TOPROL-XL) 100 MG 24 hr tablet take 1 tablet by mouth twice a day with OR IMMEDIATELY AFTER A MEAL  60 tablet  10  . oxyCODONE  (ROXICODONE) 5 MG immediate release tablet Take 1 tablet (5 mg total) by mouth every 6 (six) hours as needed for severe pain.  10 tablet  0  . pantoprazole (PROTONIX) 40 MG tablet Take 1 tablet (40 mg total) by mouth daily.  30 tablet  3  . PROVENTIL HFA 108 (90 BASE) MCG/ACT inhaler Inhale 2 puffs into the lungs every 4 (four) hours as needed. Wheezing and/or shortness of breath      . psyllium (METAMUCIL) 58.6 % powder Take 1 packet by mouth daily as needed (constipation).       . Tamsulosin HCl (FLOMAX) 0.4 MG CAPS Take 0.4 mg by mouth daily after supper.       . triamcinolone cream (KENALOG) 0.1 % Apply 1 application topically daily as needed (itching).       . zolpidem (AMBIEN) 5 MG tablet Take 5 mg by mouth at bedtime as needed for sleep.        No current facility-administered medications for this visit.    Past Medical History  Diagnosis Date  . Chronic kidney disease   . Thrombocythemia   . Esophageal stricture   . Anemia   . Anemia due to chronic renal failure treated with erythropoietin 03/24/2011  . Retinal vein thrombosis, left   . Coronary artery disease   . Hypertension   . GERD (gastroesophageal reflux disease)   . Arthritis   . Anginal pain     Past Surgical History  Procedure Laterality Date  . Replacement total knee Right 04/19/08  . Av fistula placement Left 12/01/2013    Procedure: RADIOCEPHALIC ARTERIOVENOUS (AV) FISTULA CREATION;  Surgeon: Elam Dutch, MD;  Location: [facility];  Service: Vascular;  Laterality: Left;  . Coronary artery bypass graft  2008    x 5  . Eye surgery Left yrs ago    vitrectomy  . Eye brow lift Right     01-11-2014  . Inguinal hernia repair Left 05/08/01  . Colonoscopy N/A 02/02/2014    Procedure: COLONOSCOPY;  Surgeon: Inda Castle, MD;  Location: [facility];  Service: Endoscopy;  Laterality: N/A;  . Esophagogastroduodenoscopy N/A 02/02/2014    Procedure: ESOPHAGOGASTRODUODENOSCOPY (EGD);  Surgeon: Inda Castle, MD;   Location: [facility];  Service: Endoscopy;  Laterality: N/A;    Family History  Problem Relation Age of Onset  . Dementia Mother   . Angina Father   . Heart attack Neg Hx     History   Social History  . Marital Status: Married    Spouse  Name: [name]    Number of Children: 2  . Years of Education: 12   Occupational History  .      retired   Social History Main Topics  . Smoking status: Former Smoker    Types: Cigarettes    Quit date: 11/27/1978  . Smokeless tobacco: Former Systems developer    Types: [place] date: 11/27/1978     Comment: quit 30 +yrs ago  . Alcohol Use: No     Comment: Quit 30 + yrs ago  . Drug Use: No  . Sexual Activity: Not on file   Other Topics Concern  . Not on file   Social History Narrative   Patient lives at home with wife [name]) and son.   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 Neck: No JVD, HJR, Bruit, or thyroid enlargement  Lungs: No tachypnea, clear without wheezing, rales, or rhonchi  Cardiovascular: RRR, PMI not displaced, 2/6 systolic murmur at the left sternal border, no gallops, bruit, thrill, or heave.  Abdomen: BS normal. Soft without organomegaly, masses, lesions or tenderness.  Extremities: without cyanosis, clubbing or edema. Good distal pulses bilateral  SKin: Warm, no lesions or rashes   Musculoskeletal: No deformities  Neuro: no focal signs   Wt Readings from Last 3 Encounters:  03/03/14 161 lb 6.4 oz (73.211 kg)  01/28/14 157 lb 9.6 oz (71.487 kg)  01/20/14 154 lb (69.854 kg)     EKG: Normal sinus rhythm with first degree AV block anterior infarct, nonspecific ST-T wave changes some T-wave inversion anterior laterally more pronounced from prior tracings

## 2014-03-03 NOTE — Assessment & Plan Note (Signed)
No evidence of heart failure and exam. Stop Imdur.

## 2014-03-05 ENCOUNTER — Telehealth: Payer: Self-pay | Admitting: Gastroenterology

## 2014-03-05 ENCOUNTER — Other Ambulatory Visit: Payer: Self-pay

## 2014-03-05 MED ORDER — PANTOPRAZOLE SODIUM 40 MG PO TBEC: 40.0000 mg | DELAYED_RELEASE_TABLET | Freq: Every day | ORAL | Status: DC

## 2014-03-05 NOTE — Telephone Encounter (Signed)
Order for pantoprazole changed verbally at the [facility]. Spoke with the patient. He has enough medication to last about 5 days. Called to [facility] and gave a verbal prior auth request. Patient aware of this.

## 2014-03-08 ENCOUNTER — Ambulatory Visit (HOSPITAL_BASED_OUTPATIENT_CLINIC_OR_DEPARTMENT_OTHER): Payer: Medicare Other

## 2014-03-08 ENCOUNTER — Other Ambulatory Visit (HOSPITAL_BASED_OUTPATIENT_CLINIC_OR_DEPARTMENT_OTHER): Payer: Medicare Other

## 2014-03-08 ENCOUNTER — Other Ambulatory Visit: Payer: Medicare Other

## 2014-03-08 ENCOUNTER — Encounter: Payer: Self-pay | Admitting: Hematology

## 2014-03-08 ENCOUNTER — Telehealth: Payer: Self-pay | Admitting: Hematology

## 2014-03-08 ENCOUNTER — Ambulatory Visit (HOSPITAL_BASED_OUTPATIENT_CLINIC_OR_DEPARTMENT_OTHER): Payer: Medicare Other | Admitting: Hematology

## 2014-03-08 ENCOUNTER — Ambulatory Visit: Payer: Medicare Other

## 2014-03-08 VITALS — BP 142/60 | HR 55 | Temp 97.4°F | Resp 17 | Ht 70.0 in | Wt 159.3 lb

## 2014-03-08 DIAGNOSIS — N183 Chronic kidney disease, stage 3 (moderate): Secondary | ICD-10-CM | POA: Diagnosis not present

## 2014-03-08 DIAGNOSIS — N189 Chronic kidney disease, unspecified: Secondary | ICD-10-CM

## 2014-03-08 DIAGNOSIS — D631 Anemia in chronic kidney disease: Secondary | ICD-10-CM

## 2014-03-08 DIAGNOSIS — D649 Anemia, unspecified: Secondary | ICD-10-CM

## 2014-03-08 DIAGNOSIS — I2510 Atherosclerotic heart disease of native coronary artery without angina pectoris: Secondary | ICD-10-CM

## 2014-03-08 LAB — COMPREHENSIVE METABOLIC PANEL (CC13)
ALBUMIN: 2.5 g/dL — AB (ref 3.5–5.0)
ALT: 14 U/L (ref 0–55)
AST: 13 U/L (ref 5–34)
Alkaline Phosphatase: 77 U/L (ref 40–150)
Anion Gap: 9 mEq/L (ref 3–11)
BUN: 79 mg/dL — AB (ref 7.0–26.0)
CALCIUM: 8.6 mg/dL (ref 8.4–10.4)
CHLORIDE: 100 meq/L (ref 98–109)
CO2: 24 mEq/L (ref 22–29)
Creatinine: 2.5 mg/dL — ABNORMAL HIGH (ref 0.7–1.3)
Glucose: 112 mg/dl (ref 70–140)
Potassium: 4.3 mEq/L (ref 3.5–5.1)
Sodium: 133 mEq/L — ABNORMAL LOW (ref 136–145)
TOTAL PROTEIN: 5.6 g/dL — AB (ref 6.4–8.3)
Total Bilirubin: 0.31 mg/dL (ref 0.20–1.20)

## 2014-03-08 LAB — CBC WITH DIFFERENTIAL/PLATELET
BASO%: 0.9 % (ref 0.0–2.0)
BASOS ABS: 0 10*3/uL (ref 0.0–0.1)
EOS%: 4.3 % (ref 0.0–7.0)
Eosinophils Absolute: 0.2 10*3/uL (ref 0.0–0.5)
HCT: 25.2 % — ABNORMAL LOW (ref 38.4–49.9)
HEMOGLOBIN: 7.7 g/dL — AB (ref 13.0–17.1)
LYMPH%: 14.2 % (ref 14.0–49.0)
MCH: 26.1 pg — ABNORMAL LOW (ref 27.2–33.4)
MCHC: 30.7 g/dL — ABNORMAL LOW (ref 32.0–36.0)
MCV: 84.8 fL (ref 79.3–98.0)
MONO#: 0.5 10*3/uL (ref 0.1–0.9)
MONO%: 12.3 % (ref 0.0–14.0)
NEUT%: 68.3 % (ref 39.0–75.0)
NEUTROS ABS: 2.6 10*3/uL (ref 1.5–6.5)
Platelets: 187 10*3/uL (ref 140–400)
RBC: 2.97 10*6/uL — ABNORMAL LOW (ref 4.20–5.82)
RDW: 17.3 % — ABNORMAL HIGH (ref 11.0–14.6)
WBC: 3.8 10*3/uL — AB (ref 4.0–10.3)
lymph#: 0.5 10*3/uL — ABNORMAL LOW (ref 0.9–3.3)

## 2014-03-08 LAB — IRON AND TIBC CHCC
%SAT: 9 % — ABNORMAL LOW (ref 20–55)
IRON: 21 ug/dL — AB (ref 42–163)
TIBC: 236 ug/dL (ref 202–409)
UIBC: 215 ug/dL (ref 117–376)

## 2014-03-08 LAB — FERRITIN CHCC: FERRITIN: 50 ng/mL (ref 22–316)

## 2014-03-08 MED ORDER — DARBEPOETIN ALFA-POLYSORBATE 300 MCG/0.6ML IJ SOLN
300.0000 ug | Freq: Once | INTRAMUSCULAR | Status: AC
  Administered 2014-03-08: 300 ug via SUBCUTANEOUS
  Filled 2014-03-08: qty 0.6

## 2014-03-08 NOTE — Progress Notes (Signed)
[facility] PROGRESS NOTE DATE OF VISIT: 03/08/2014  Bryan Hatchet, MD [name] [address]  DIAGNOSIS: Anemia, unspecified anemia type - Plan: CT Biopsy  Chief Complaint  Patient presents with  . Follow-up    CURRENT THERAPY: 1. Aspirin 81 mg by mouth daily. 2. Anagrelide (Agrylin) 1 mg by mouth daily. 3. Aranesp 300 mcg subcu every 2 weeks for hemoglobin less than 11.   INTERVAL HISTORY:  [name] 80 y.o. male history of essential thrombocythemia and anemia due to chronic renal failure treated with erythropoietin is her for 2 month follow-up and first visit with me.  He was last seen by Dr [name] on 01/11/2014. He reports doing very well.  He denies recent hospitalizations or recurrent infections or bleeding.  He denies hematochezia or melena.   His weight is stable. He saw neurology for balance problems and had an MRI of brain done which he reports was negative.  He was given medicine (sinemet) for parkinson's disease.  He is follow up again with neurology in December. He reports he is walking and doing some light aerobics at home. He is on Xanax and will request a refill from his primary care provider. He states that he is not that tired and does not want blood today even though hemoglobin is 7.7 grams. We are checking again in 2 weeks and he may need transfusion then.  I also saw that as part of his anemia a bone marrow aspirate and biopsy has not been done and I think it would be important to do that because in addition to his renal failure, there may be an underlying myelodysplastic syndrome or a primary bone marrow failure syndrome and that is the reason he is not responding appropriately to Aranesp.    MEDICAL HISTORY: Past Medical History  Diagnosis Date  . Chronic kidney disease   . Thrombocythemia   . Esophageal stricture   . Anemia   . Anemia due to chronic renal failure treated with erythropoietin 03/24/2011  .  Retinal vein thrombosis, left   . Coronary artery disease   . Hypertension   . GERD (gastroesophageal reflux disease)   . Arthritis   . Anginal pain     INTERIM HISTORY: has CAD; STRICTURE AND STENOSIS OF ESOPHAGUS; ESOPHAGEAL REFLUX; Essential thrombocythemia; Anemia due to chronic renal failure treated with erythropoietin; Retinal vein thrombosis, left; Murmur; CKD (chronic kidney disease); Ischemic cardiomyopathy; Essential hypertension; Abnormal involuntary movement; Abnormality of gait; End stage renal disease; Anemia, unspecified; Heme + stool; Erosive esophagitis; Dyspnea on exertion; and Prescription drug abuse on his problem list.    ALLERGIES:  is allergic to isosorbide and norvasc.  MEDICATIONS: has a current medication list which includes the following prescription(s): acyclovir, alprazolam, anagrelide, aspirin, carbidopa-levodopa, carboxymethylcellulose, clonidine, fluticasone, furosemide, hydralazine, ketoconazole, metoprolol succinate, oxycodone, pantoprazole, proventil hfa, psyllium, tamsulosin, triamcinolone cream, and zolpidem, and the following Facility-Administered Medications: darbepoetin.  SURGICAL HISTORY:  Past Surgical History  Procedure Laterality Date  . Replacement total knee Right 04/19/08  . Av fistula placement Left 12/01/2013    Procedure: RADIOCEPHALIC ARTERIOVENOUS (AV) FISTULA CREATION;  Surgeon: Elam Dutch, MD;  Location: [facility];  Service: Vascular;  Laterality: Left;  . Coronary artery bypass graft  2008    x 5  . Eye surgery Left yrs ago    vitrectomy  . Eye brow lift Right     01-11-2014  . Inguinal hernia repair Left 05/08/01  . Colonoscopy N/A 02/02/2014  Procedure: COLONOSCOPY;  Surgeon: Inda Castle, MD;  Location: [facility];  Service: Endoscopy;  Laterality: N/A;  . Esophagogastroduodenoscopy N/A 02/02/2014    Procedure: ESOPHAGOGASTRODUODENOSCOPY (EGD);  Surgeon: Inda Castle, MD;  Location: [facility];  Service: Endoscopy;   Laterality: N/A;    REVIEW OF SYSTEMS:   Constitutional: Denies fevers, chills or abnormal weight loss Eyes: Denies blurriness of vision Ears, nose, mouth, throat, and face: Denies mucositis or sore throat Respiratory: Denies cough, dyspnea or wheezes Cardiovascular: Denies palpitation, chest discomfort or lower extremity swelling Gastrointestinal:  Denies nausea, heartburn or change in bowel habits Skin: Denies abnormal skin rashes Lymphatics: Denies new lymphadenopathy or easy bruising Neurological:Denies numbness, tingling or new weaknesses. Balance has improved a little Behavioral/Psych: Mood is stable, no new changes  All other systems were reviewed with the patient and are negative.  PHYSICAL EXAMINATION: ECOG PERFORMANCE STATUS: 0-1  Blood pressure 142/60, pulse 55, temperature 97.4 F (36.3 C), temperature source Oral, resp. rate 17, height 5\' 10"  (1.778 m), weight 159 lb 4.8 oz (72.258 kg), SpO2 90.00%.  GENERAL:alert, no distress and comfortable, elderly male; moves limbs spontaneously through out exams SKIN: skin color, texture, turgor are normal, no rashes EYES: normal, Conjunctiva are pink and non-injected, sclera clear OROPHARYNX:no exudate, no erythema and lips, buccal mucosa, and tongue normal  NECK: supple, thyroid normal size, non-tender, without nodularity LYMPH:  no palpable lymphadenopathy in the cervical, axillary or inguinal LUNGS: clear to auscultation and percussion with normal breathing effort HEART: regular rate & rhythm and no murmurs and no lower extremity edema ABDOMEN:abdomen soft, non-tender and normal bowel sounds Musculoskeletal:no peripheral edema NEURO: alert & oriented x 3 with fluent speech, no focal motor/sensory deficits; shuffling gait noted.    LABORATORY DATA: Results for orders placed in visit on 03/08/14 (from the past 48 hour(s))  CBC WITH DIFFERENTIAL     Status: Abnormal   Collection Time    03/08/14 12:57 PM      Result Value  Ref Range   WBC 3.8 (*) 4.0 - 10.3 10e3/uL   NEUT# 2.6  1.5 - 6.5 10e3/uL   HGB 7.7 (*) 13.0 - 17.1 g/dL   HCT 25.2 (*) 38.4 - 49.9 %   Platelets 187  140 - 400 10e3/uL   MCV 84.8  79.3 - 98.0 fL   MCH 26.1 (*) 27.2 - 33.4 pg   MCHC 30.7 (*) 32.0 - 36.0 g/dL   RBC 2.97 (*) 4.20 - 5.82 10e6/uL   RDW 17.3 (*) 11.0 - 14.6 %   lymph# 0.5 (*) 0.9 - 3.3 10e3/uL   MONO# 0.5  0.1 - 0.9 10e3/uL   Eosinophils Absolute 0.2  0.0 - 0.5 10e3/uL   Basophils Absolute 0.0  0.0 - 0.1 10e3/uL   NEUT% 68.3  39.0 - 75.0 %   LYMPH% 14.2  14.0 - 49.0 %   MONO% 12.3  0.0 - 14.0 %   EOS% 4.3  0.0 - 7.0 %   BASO% 0.9  0.0 - 2.0 %  COMPREHENSIVE METABOLIC PANEL (JJ94)     Status: Abnormal   Collection Time    03/08/14 12:57 PM      Result Value Ref Range   Sodium 133 (*) 136 - 145 mEq/L   Potassium 4.3  3.5 - 5.1 mEq/L   Chloride 100  98 - 109 mEq/L   CO2 24  22 - 29 mEq/L   Glucose 112  70 - 140 mg/dl   BUN 79.0 (*) 7.0 - 26.0 mg/dL  Creatinine 2.5 (*) 0.7 - 1.3 mg/dL   Total Bilirubin 0.31  0.20 - 1.20 mg/dL   Alkaline Phosphatase 77  40 - 150 U/L   AST 13  5 - 34 U/L   ALT 14  0 - 55 U/L   Total Protein 5.6 (*) 6.4 - 8.3 g/dL   Albumin 2.5 (*) 3.5 - 5.0 g/dL   Calcium 8.6  8.4 - 10.4 mg/dL   Anion Gap 9  3 - 11 mEq/L    RADIOGRAPHIC STUDIES: No results found.  ASSESSMENT/PLAN:   1. Essential Thrombocythemia (09/1993).  --Mr. Mccorkel continues to do fairly well on the current treatment program.  --Platelet counts have been quite satisfactory usually in the 300-400,000 range. Platelet count today is 187,000.  2. Anemia secondary to chronic renal disease. --Based on hemoglobin of 8.0 . He will receive Aranesp today 300 mcg subcu today. We will continue to check CBCs every 2 weeks (from every 3 weeks) and we will give him Aranesp 300 mcg subcu whenever the hemoglobin is less than 11. Erythropoietin level is 33.9. He is asymptomatic at his current level of anemia. We can consider escalating the  dose but first I will order a bone marrow aspirate and a biopsy which is being scheduled for 03/22/2014 and he will be off Aspirin for a week prior to that.  3. Shuffling gait, episodic movements. --He will follow up with neurology (Dr. Krista Blue) prn and his next scheduled appointment is 05/2014. He was started on sinemet but it did not improve his symptoms. He was again counseled on fall precautions.   4. Follow-up.  --We will plan to see him again in 1 months, at which time we will check CBC, chemistries and iron studies.  All questions were answered. The patient knows to call the clinic with any problems, questions or concerns. We can certainly see the patient much sooner if necessary.  I spent 20 minutes counseling the patient face to face. The total time spent in the appointment was 25 minutes.    Bernadene Bell, MD Medical Hematologist/Oncologist [facility] Pager: [phone] Office No: [phone]

## 2014-03-08 NOTE — Telephone Encounter (Signed)
Twice a day dosing approved through his insurance. Pharmacist says the prescription is ready to be picked up. Left this information with patient.

## 2014-03-08 NOTE — Telephone Encounter (Signed)
Pt confirmed labs/ov per 10/05 POF, gave pt AVS..... KJ °

## 2014-03-09 ENCOUNTER — Telehealth: Payer: Self-pay | Admitting: *Deleted

## 2014-03-09 ENCOUNTER — Other Ambulatory Visit: Payer: Self-pay | Admitting: *Deleted

## 2014-03-09 DIAGNOSIS — D638 Anemia in other chronic diseases classified elsewhere: Secondary | ICD-10-CM

## 2014-03-09 NOTE — Telephone Encounter (Signed)
Per desk RN I have scheduled appts. Desk RN to call the patient

## 2014-03-09 NOTE — Telephone Encounter (Signed)
Received call from Demetrios's wife, [name].  States that he is weaker and feels like he needs to have a blood transfusion.  Also stated that they have decided against doing the Bone Marrow that he is scheduled for on October 19.   She ask for [place] to set up the transfusion for today I will speak to Dr Lona Kettle and his nurse, then we will get back to them when we have a time .

## 2014-03-10 ENCOUNTER — Other Ambulatory Visit (HOSPITAL_BASED_OUTPATIENT_CLINIC_OR_DEPARTMENT_OTHER): Payer: Medicare Other

## 2014-03-10 ENCOUNTER — Ambulatory Visit: Payer: Medicare Other | Admitting: Gastroenterology

## 2014-03-10 ENCOUNTER — Ambulatory Visit (HOSPITAL_COMMUNITY)
Admission: RE | Admit: 2014-03-10 | Discharge: 2014-03-10 | Disposition: A | Discharge status: Home / Self Care | Payer: Medicare Other | Source: Ambulatory Visit | Attending: Internal Medicine | Admitting: Internal Medicine

## 2014-03-10 DIAGNOSIS — D638 Anemia in other chronic diseases classified elsewhere: Secondary | ICD-10-CM | POA: Insufficient documentation

## 2014-03-10 DIAGNOSIS — D631 Anemia in chronic kidney disease: Secondary | ICD-10-CM

## 2014-03-10 DIAGNOSIS — N189 Chronic kidney disease, unspecified: Secondary | ICD-10-CM

## 2014-03-10 LAB — CBC WITH DIFFERENTIAL/PLATELET
BASO%: 1 % (ref 0.0–2.0)
Basophils Absolute: 0 10*3/uL (ref 0.0–0.1)
EOS%: 3.8 % (ref 0.0–7.0)
Eosinophils Absolute: 0.1 10*3/uL (ref 0.0–0.5)
HCT: 24.7 % — ABNORMAL LOW (ref 38.4–49.9)
HGB: 7.5 g/dL — ABNORMAL LOW (ref 13.0–17.1)
LYMPH%: 15.4 % (ref 14.0–49.0)
MCH: 25.9 pg — ABNORMAL LOW (ref 27.2–33.4)
MCHC: 30.5 g/dL — ABNORMAL LOW (ref 32.0–36.0)
MCV: 84.8 fL (ref 79.3–98.0)
MONO#: 0.4 10*3/uL (ref 0.1–0.9)
MONO%: 11.5 % (ref 0.0–14.0)
NEUT#: 2.5 10*3/uL (ref 1.5–6.5)
NEUT%: 68.3 % (ref 39.0–75.0)
Platelets: 192 10*3/uL (ref 140–400)
RBC: 2.92 10*6/uL — ABNORMAL LOW (ref 4.20–5.82)
RDW: 17.5 % — ABNORMAL HIGH (ref 11.0–14.6)
WBC: 3.6 10*3/uL — ABNORMAL LOW (ref 4.0–10.3)
lymph#: 0.6 10*3/uL — ABNORMAL LOW (ref 0.9–3.3)

## 2014-03-10 LAB — HOLD TUBE, BLOOD BANK

## 2014-03-10 LAB — PREPARE RBC (CROSSMATCH)

## 2014-03-12 ENCOUNTER — Ambulatory Visit (HOSPITAL_BASED_OUTPATIENT_CLINIC_OR_DEPARTMENT_OTHER): Payer: Medicare Other

## 2014-03-12 VITALS — BP 175/64 | HR 51 | Temp 97.2°F | Resp 16 | Ht 70.0 in

## 2014-03-12 DIAGNOSIS — N189 Chronic kidney disease, unspecified: Secondary | ICD-10-CM

## 2014-03-12 DIAGNOSIS — D631 Anemia in chronic kidney disease: Secondary | ICD-10-CM

## 2014-03-12 DIAGNOSIS — D638 Anemia in other chronic diseases classified elsewhere: Secondary | ICD-10-CM

## 2014-03-12 MED ORDER — ACETAMINOPHEN 325 MG PO TABS
ORAL_TABLET | ORAL | Status: AC
  Filled 2014-03-12: qty 2

## 2014-03-12 MED ORDER — ACETAMINOPHEN 325 MG PO TABS
650.0000 mg | ORAL_TABLET | Freq: Once | ORAL | Status: AC
  Administered 2014-03-12: 650 mg via ORAL

## 2014-03-12 MED ORDER — DIPHENHYDRAMINE HCL 25 MG PO CAPS
ORAL_CAPSULE | ORAL | Status: AC
  Filled 2014-03-12: qty 1

## 2014-03-12 MED ORDER — DIPHENHYDRAMINE HCL 25 MG PO CAPS
25.0000 mg | ORAL_CAPSULE | Freq: Once | ORAL | Status: AC
  Administered 2014-03-12: 25 mg via ORAL

## 2014-03-12 MED ORDER — SODIUM CHLORIDE 0.9 % IV SOLN
250.0000 mL | Freq: Once | INTRAVENOUS | Status: AC
  Administered 2014-03-12: 250 mL via INTRAVENOUS

## 2014-03-12 NOTE — Patient Instructions (Signed)

## 2014-03-13 LAB — TYPE AND SCREEN
ABO/RH(D): A POS
Antibody Screen: NEGATIVE
UNIT DIVISION: 0
Unit division: 0

## 2014-03-17 ENCOUNTER — Telehealth: Payer: Self-pay | Admitting: *Deleted

## 2014-03-17 NOTE — Telephone Encounter (Signed)
Pt walked in to the office complaining of shortness of breath,requesting to see Dr Aundra Dubin.  Pt stated that he needed a prescription from Dr Aundra Dubin Pt was uncooperative and irate, it was hard to determine his immediate need. Pt advised Dr Aundra Dubin not in the office until this afternoon but offered pt appt with Kathrene Alu now . Pt stated that he did not want to see Kathrene Alu, he would be back this afternoon to see Dr Aundra Dubin, and walked out of the office.

## 2014-03-19 ENCOUNTER — Other Ambulatory Visit: Payer: Self-pay | Admitting: Medical Oncology

## 2014-03-19 DIAGNOSIS — D473 Essential (hemorrhagic) thrombocythemia: Secondary | ICD-10-CM

## 2014-03-22 ENCOUNTER — Ambulatory Visit (HOSPITAL_BASED_OUTPATIENT_CLINIC_OR_DEPARTMENT_OTHER): Payer: Medicare Other

## 2014-03-22 ENCOUNTER — Telehealth: Payer: Self-pay

## 2014-03-22 ENCOUNTER — Other Ambulatory Visit (HOSPITAL_BASED_OUTPATIENT_CLINIC_OR_DEPARTMENT_OTHER): Payer: Medicare Other

## 2014-03-22 VITALS — BP 149/55 | HR 57

## 2014-03-22 DIAGNOSIS — D631 Anemia in chronic kidney disease: Secondary | ICD-10-CM

## 2014-03-22 DIAGNOSIS — N189 Chronic kidney disease, unspecified: Secondary | ICD-10-CM

## 2014-03-22 DIAGNOSIS — N183 Chronic kidney disease, stage 3 unspecified: Secondary | ICD-10-CM

## 2014-03-22 DIAGNOSIS — D473 Essential (hemorrhagic) thrombocythemia: Secondary | ICD-10-CM

## 2014-03-22 LAB — CBC WITH DIFFERENTIAL/PLATELET
BASO%: 1.1 % (ref 0.0–2.0)
Basophils Absolute: 0.1 10*3/uL (ref 0.0–0.1)
EOS%: 3.4 % (ref 0.0–7.0)
Eosinophils Absolute: 0.2 10*3/uL (ref 0.0–0.5)
HEMATOCRIT: 30.3 % — AB (ref 38.4–49.9)
HGB: 9.4 g/dL — ABNORMAL LOW (ref 13.0–17.1)
LYMPH#: 0.6 10*3/uL — AB (ref 0.9–3.3)
LYMPH%: 11.2 % — ABNORMAL LOW (ref 14.0–49.0)
MCH: 26.4 pg — ABNORMAL LOW (ref 27.2–33.4)
MCHC: 30.9 g/dL — AB (ref 32.0–36.0)
MCV: 85.6 fL (ref 79.3–98.0)
MONO#: 0.6 10*3/uL (ref 0.1–0.9)
MONO%: 11.7 % (ref 0.0–14.0)
NEUT#: 3.8 10*3/uL (ref 1.5–6.5)
NEUT%: 72.6 % (ref 39.0–75.0)
Platelets: 236 10*3/uL (ref 140–400)
RBC: 3.54 10*6/uL — AB (ref 4.20–5.82)
RDW: 18.4 % — ABNORMAL HIGH (ref 11.0–14.6)
WBC: 5.3 10*3/uL (ref 4.0–10.3)

## 2014-03-22 MED ORDER — DARBEPOETIN ALFA-POLYSORBATE 300 MCG/0.6ML IJ SOLN
300.0000 ug | Freq: Once | INTRAMUSCULAR | Status: AC
  Administered 2014-03-22: 300 ug via SUBCUTANEOUS
  Filled 2014-03-22: qty 0.6

## 2014-03-22 NOTE — Patient Instructions (Signed)
Darbepoetin Alfa injection What is this medicine? DARBEPOETIN ALFA (dar be POE e tin AL fa) helps your body make more red blood cells. It is used to treat anemia caused by chronic kidney failure and chemotherapy. This medicine may be used for other purposes; ask your health care provider or pharmacist if you have questions. COMMON BRAND NAME(S): Aranesp What should I tell my health care provider before I take this medicine? They need to know if you have any of these conditions: -blood clotting disorders or history of blood clots -cancer patient not on chemotherapy -cystic fibrosis -heart disease, such as angina, heart failure, or a history of a heart attack -hemoglobin level of 12 g/dL or greater -high blood pressure -low levels of folate, iron, or vitamin B12 -seizures -an unusual or allergic reaction to darbepoetin, erythropoietin, albumin, hamster proteins, latex, other medicines, foods, dyes, or preservatives -pregnant or trying to get pregnant -breast-feeding How should I use this medicine? This medicine is for injection into a vein or under the skin. It is usually given by a health care professional in a hospital or clinic setting. If you get this medicine at home, you will be taught how to prepare and give this medicine. Do not shake the solution before you withdraw a dose. Use exactly as directed. Take your medicine at regular intervals. Do not take your medicine more often than directed. It is important that you put your used needles and syringes in a special sharps container. Do not put them in a trash can. If you do not have a sharps container, call your pharmacist or healthcare provider to get one. Talk to your pediatrician regarding the use of this medicine in children. While this medicine may be used in children as young as 1 year for selected conditions, precautions do apply. Overdosage: If you think you have taken too much of this medicine contact a poison control center or  emergency room at once. NOTE: This medicine is only for you. Do not share this medicine with others. What if I miss a dose? If you miss a dose, take it as soon as you can. If it is almost time for your next dose, take only that dose. Do not take double or extra doses. What may interact with this medicine? Do not take this medicine with any of the following medications: -epoetin alfa This list may not describe all possible interactions. Give your health care provider a list of all the medicines, herbs, non-prescription drugs, or dietary supplements you use. Also tell them if you smoke, drink alcohol, or use illegal drugs. Some items may interact with your medicine. What should I watch for while using this medicine? Visit your prescriber or health care professional for regular checks on your progress and for the needed blood tests and blood pressure measurements. It is especially important for the doctor to make sure your hemoglobin level is in the desired range, to limit the risk of potential side effects and to give you the best benefit. Keep all appointments for any recommended tests. Check your blood pressure as directed. Ask your doctor what your blood pressure should be and when you should contact him or her. As your body makes more red blood cells, you may need to take iron, folic acid, or vitamin B supplements. Ask your doctor or health care provider which products are right for you. If you have kidney disease continue dietary restrictions, even though this medication can make you feel better. Talk with your doctor or health   care professional about the foods you eat and the vitamins that you take. What side effects may I notice from receiving this medicine? Side effects that you should report to your doctor or health care professional as soon as possible: -allergic reactions like skin rash, itching or hives, swelling of the face, lips, or tongue -breathing problems -changes in vision -chest  pain -confusion, trouble speaking or understanding -feeling faint or lightheaded, falls -high blood pressure -muscle aches or pains -pain, swelling, warmth in the leg -rapid weight gain -severe headaches -sudden numbness or weakness of the face, arm or leg -trouble walking, dizziness, loss of balance or coordination -seizures (convulsions) -swelling of the ankles, feet, hands -unusually weak or tired Side effects that usually do not require medical attention (report to your doctor or health care professional if they continue or are bothersome): -diarrhea -fever, chills (flu-like symptoms) -headaches -nausea, vomiting -redness, stinging, or swelling at site where injected This list may not describe all possible side effects. Call your doctor for medical advice about side effects. You may report side effects to FDA at 1-800-FDA-1088. Where should I keep my medicine? Keep out of the reach of children. Store in a refrigerator between 2 and 8 degrees C (36 and 46 degrees F). Do not freeze. Do not shake. Throw away any unused portion if using a single-dose vial. Throw away any unused medicine after the expiration date. NOTE: This sheet is a summary. It may not cover all possible information. If you have questions about this medicine, talk to your doctor, pharmacist, or health care provider.  2015, [employer]vier/Gold Standard. (2008-05-04 10:23:57)  

## 2014-03-22 NOTE — Telephone Encounter (Signed)
I spoke with [name] in the lobby today because he refused to leave the office without seeing Dr. Aundra Dubin. I offered him another appointment, he refused. [employer] and [employer] came here to assist me.

## 2014-04-02 ENCOUNTER — Encounter: Payer: Self-pay | Admitting: Gastroenterology

## 2014-04-02 ENCOUNTER — Ambulatory Visit (INDEPENDENT_AMBULATORY_CARE_PROVIDER_SITE_OTHER): Payer: Medicare Other | Admitting: Gastroenterology

## 2014-04-02 VITALS — BP 106/40 | HR 68 | Ht 70.0 in | Wt 157.0 lb

## 2014-04-02 DIAGNOSIS — I2510 Atherosclerotic heart disease of native coronary artery without angina pectoris: Secondary | ICD-10-CM

## 2014-04-02 DIAGNOSIS — D631 Anemia in chronic kidney disease: Secondary | ICD-10-CM

## 2014-04-02 DIAGNOSIS — K2210 Ulcer of esophagus without bleeding: Secondary | ICD-10-CM

## 2014-04-02 DIAGNOSIS — K208 Other esophagitis: Secondary | ICD-10-CM

## 2014-04-02 DIAGNOSIS — R195 Other fecal abnormalities: Secondary | ICD-10-CM

## 2014-04-02 DIAGNOSIS — N181 Chronic kidney disease, stage 1: Secondary | ICD-10-CM

## 2014-04-02 NOTE — Assessment & Plan Note (Signed)
Anemia is probably multifactorial.  Hemoglobin stable and he is Hemoccult negative.

## 2014-04-02 NOTE — Patient Instructions (Signed)
Use Miralax as needed for constipation.

## 2014-04-02 NOTE — Assessment & Plan Note (Signed)
Patient is asymptomatic on twice a day Protonix.  He was instructed to try reducing to once a day

## 2014-04-02 NOTE — Assessment & Plan Note (Signed)
GI workup demonstrated erosive esophagitis as a possible source for heme-positive stool.  Colonoscopy was negative.  He is now Hemoccult negative

## 2014-04-02 NOTE — Progress Notes (Signed)
      History of Present Illness:  [name] has returned following upper and lower endoscopy for Hemoccult-positive stool.  Erosive esophagitis was identified.  This was felt to be a possible cause for heme positive stool, less likely for his anemia.  Protonix was increased to twice a day.  Patient is feeling well except for mild constipation.  Follow-up hemoglobin on October 19 was 9.4.  It was previously 7.7.  He tested Hemoccult negative.    Review of Systems: Pertinent positive and negative review of systems were noted in the above HPI section. All other review of systems were otherwise negative.    Current Medications, Allergies, Past Medical History, Past Surgical History, Family History and Social History were reviewed in [facility] record  Vital signs were reviewed in today's medical record. Physical Exam: General: Well developed , well nourished, no acute distress   See Assessment and Plan under Problem List

## 2014-04-05 ENCOUNTER — Encounter: Payer: Self-pay | Admitting: Hematology

## 2014-04-05 ENCOUNTER — Ambulatory Visit (HOSPITAL_BASED_OUTPATIENT_CLINIC_OR_DEPARTMENT_OTHER): Payer: Medicare Other | Admitting: Hematology

## 2014-04-05 ENCOUNTER — Ambulatory Visit (HOSPITAL_BASED_OUTPATIENT_CLINIC_OR_DEPARTMENT_OTHER): Payer: Medicare Other

## 2014-04-05 ENCOUNTER — Other Ambulatory Visit (HOSPITAL_BASED_OUTPATIENT_CLINIC_OR_DEPARTMENT_OTHER): Payer: Medicare Other

## 2014-04-05 ENCOUNTER — Ambulatory Visit: Payer: Medicare Other

## 2014-04-05 VITALS — BP 146/55 | HR 53 | Temp 97.5°F | Resp 18 | Ht 70.0 in | Wt 155.7 lb

## 2014-04-05 DIAGNOSIS — D539 Nutritional anemia, unspecified: Secondary | ICD-10-CM

## 2014-04-05 DIAGNOSIS — D473 Essential (hemorrhagic) thrombocythemia: Secondary | ICD-10-CM

## 2014-04-05 DIAGNOSIS — D631 Anemia in chronic kidney disease: Secondary | ICD-10-CM

## 2014-04-05 DIAGNOSIS — N189 Chronic kidney disease, unspecified: Secondary | ICD-10-CM

## 2014-04-05 DIAGNOSIS — N181 Chronic kidney disease, stage 1: Secondary | ICD-10-CM

## 2014-04-05 LAB — CBC WITH DIFFERENTIAL/PLATELET
BASO%: 1.9 % (ref 0.0–2.0)
BASOS ABS: 0.1 10*3/uL (ref 0.0–0.1)
EOS ABS: 0.3 10*3/uL (ref 0.0–0.5)
EOS%: 4.9 % (ref 0.0–7.0)
HEMATOCRIT: 29.5 % — AB (ref 38.4–49.9)
HEMOGLOBIN: 9 g/dL — AB (ref 13.0–17.1)
LYMPH#: 0.8 10*3/uL — AB (ref 0.9–3.3)
LYMPH%: 15.6 % (ref 14.0–49.0)
MCH: 26.2 pg — ABNORMAL LOW (ref 27.2–33.4)
MCHC: 30.7 g/dL — ABNORMAL LOW (ref 32.0–36.0)
MCV: 85.3 fL (ref 79.3–98.0)
MONO#: 0.6 10*3/uL (ref 0.1–0.9)
MONO%: 11.9 % (ref 0.0–14.0)
NEUT%: 65.7 % (ref 39.0–75.0)
NEUTROS ABS: 3.5 10*3/uL (ref 1.5–6.5)
Platelets: 270 10*3/uL (ref 140–400)
RBC: 3.45 10*6/uL — ABNORMAL LOW (ref 4.20–5.82)
RDW: 18.4 % — ABNORMAL HIGH (ref 11.0–14.6)
WBC: 5.3 10*3/uL (ref 4.0–10.3)

## 2014-04-05 MED ORDER — DARBEPOETIN ALFA 300 MCG/0.6ML IJ SOSY
300.0000 ug | PREFILLED_SYRINGE | Freq: Once | INTRAMUSCULAR | Status: AC
  Administered 2014-04-05: 300 ug via SUBCUTANEOUS
  Filled 2014-04-05: qty 0.6

## 2014-04-05 NOTE — Progress Notes (Signed)
[facility] OFFICE PROGRESS NOTE DATE OF VISIT: 04/05/2014  Bryan Hatchet, MD [place] [address]  DIAGNOSIS: Anemia associated with nutritional deficiency, unspecified nutritional anemia type  Essential thrombocythemia  Chief Complaint  Patient presents with  . Follow-up    CURRENT THERAPY: 1. Aspirin 81 mg by mouth daily. 2. Anagrelide (Agrylin) 1 mg by mouth daily. 3. Aranesp 300 mcg subcu every 2 weeks for hemoglobin less than 11.   INTERVAL HISTORY:  [name] 80 y.o. male history of essential thrombocythemia and anemia due to chronic renal failure treated with erythropoietin is here for 2 month follow-up and last seen by me on 03/08/2014. He reports doing very well.  He denies recent hospitalizations or recurrent infections or bleeding.  He denies hematochezia or melena.   His weight is stable. He saw neurology for balance problems and had an MRI of brain done which he reports was negative.  He was given medicine (sinemet) for parkinson's disease.  He is follow up again with neurology in December. He reports he is walking and doing some light aerobics at home. He is on Xanax and will request a refill from his primary care provider. He states that he is not that tired and does not want blood today even though hemoglobin is 7.7 grams. We are checking again in 2 weeks and he may need transfusion then.  I also saw that as part of his anemia a bone marrow aspirate and biopsy has not been done and I think it would be important to do that because in addition to his renal failure, there may be an underlying myelodysplastic syndrome or a primary bone marrow failure syndrome and that is the reason he is not responding appropriately to Aranesp. I recommended that but patient and his wife after discussion and deliberation decided against that.    MEDICAL HISTORY: Past Medical History  Diagnosis Date  . Chronic kidney disease   . Thrombocythemia    . Esophageal stricture   . Anemia   . Anemia due to chronic renal failure treated with erythropoietin 03/24/2011  . Retinal vein thrombosis, left   . Coronary artery disease   . Hypertension   . GERD (gastroesophageal reflux disease)   . Arthritis   . Anginal pain   . Erosive esophagitis     grade 4  . Diverticulosis   . Internal hemorrhoids     INTERIM HISTORY: has CAD; STRICTURE AND STENOSIS OF ESOPHAGUS; ESOPHAGEAL REFLUX; Essential thrombocythemia; Anemia due to chronic renal failure treated with erythropoietin; Retinal vein thrombosis, left; Murmur; CKD (chronic kidney disease); Ischemic cardiomyopathy; Essential hypertension; Abnormal involuntary movement; Abnormality of gait; End stage renal disease; Anemia, unspecified; Heme + stool; Erosive esophagitis; Dyspnea on exertion; and Prescription drug abuse on his problem list.    ALLERGIES:  is allergic to isosorbide and norvasc.  MEDICATIONS: has a current medication list which includes the following prescription(s): acyclovir, alprazolam, anagrelide, aspirin, carbidopa-levodopa, carboxymethylcellulose, clonidine, fluticasone, furosemide, hydralazine, ketoconazole, metoprolol succinate, oxycodone, pantoprazole, proventil hfa, psyllium, tamsulosin, triamcinolone cream, and zolpidem.  SURGICAL HISTORY:  Past Surgical History  Procedure Laterality Date  . Replacement total knee Right 04/19/08  . Av fistula placement Left 12/01/2013    Procedure: RADIOCEPHALIC ARTERIOVENOUS (AV) FISTULA CREATION;  Surgeon: Elam Dutch, MD;  Location: [facility];  Service: Vascular;  Laterality: Left;  . Coronary artery bypass graft  2008    x 5  . Eye surgery Left yrs ago    vitrectomy  .  Eye brow lift Right     01-11-2014  . Inguinal hernia repair Left 05/08/01  . Colonoscopy N/A 02/02/2014    Procedure: COLONOSCOPY;  Surgeon: Inda Castle, MD;  Location: [facility];  Service: Endoscopy;  Laterality: N/A;  . Esophagogastroduodenoscopy N/A  02/02/2014    Procedure: ESOPHAGOGASTRODUODENOSCOPY (EGD);  Surgeon: Inda Castle, MD;  Location: [facility];  Service: Endoscopy;  Laterality: N/A;    REVIEW OF SYSTEMS:   Constitutional: Denies fevers, chills or abnormal weight loss Eyes: Denies blurriness of vision Ears, nose, mouth, throat, and face: Denies mucositis or sore throat Respiratory: Denies cough, dyspnea or wheezes Cardiovascular: Denies palpitation, chest discomfort or lower extremity swelling Gastrointestinal:  Denies nausea, heartburn or change in bowel habits Skin: Denies abnormal skin rashes Lymphatics: Denies new lymphadenopathy or easy bruising Neurological:Denies numbness, tingling or new weaknesses. Balance has improved a little Behavioral/Psych: Mood is stable, no new changes  All other systems were reviewed with the patient and are negative.  PHYSICAL EXAMINATION: ECOG PERFORMANCE STATUS: 0-1  Blood pressure 146/55, pulse 53, temperature 97.5 F (36.4 C), temperature source Oral, resp. rate 18, height 5\' 10"  (1.778 m), weight 155 lb 11.2 oz (70.625 kg), SpO2 99 %.  GENERAL:alert, no distress and comfortable, elderly male; moves limbs spontaneously through out exams SKIN: skin color, texture, turgor are normal, no rashes EYES: normal, Conjunctiva are pink and non-injected, sclera clear OROPHARYNX:no exudate, no erythema and lips, buccal mucosa, and tongue normal  NECK: supple, thyroid normal size, non-tender, without nodularity LYMPH:  no palpable lymphadenopathy in the cervical, axillary or inguinal LUNGS: clear to auscultation and percussion with normal breathing effort HEART: regular rate & rhythm and no murmurs and no lower extremity edema ABDOMEN:abdomen soft, non-tender and normal bowel sounds Musculoskeletal:no peripheral edema NEURO: alert & oriented x 3 with fluent speech, no focal motor/sensory deficits; shuffling gait noted.    LABORATORY DATA: Results for orders placed or performed in  visit on 04/05/14 (from the past 48 hour(s))  CBC with Differential     Status: Abnormal   Collection Time: 04/05/14  2:59 PM  Result Value Ref Range   WBC 5.3 4.0 - 10.3 10e3/uL   NEUT# 3.5 1.5 - 6.5 10e3/uL   HGB 9.0 (L) 13.0 - 17.1 g/dL   HCT 29.5 (L) 38.4 - 49.9 %   Platelets 270 140 - 400 10e3/uL   MCV 85.3 79.3 - 98.0 fL   MCH 26.2 (L) 27.2 - 33.4 pg   MCHC 30.7 (L) 32.0 - 36.0 g/dL   RBC 3.45 (L) 4.20 - 5.82 10e6/uL   RDW 18.4 (H) 11.0 - 14.6 %   lymph# 0.8 (L) 0.9 - 3.3 10e3/uL   MONO# 0.6 0.1 - 0.9 10e3/uL   Eosinophils Absolute 0.3 0.0 - 0.5 10e3/uL   Basophils Absolute 0.1 0.0 - 0.1 10e3/uL   NEUT% 65.7 39.0 - 75.0 %   LYMPH% 15.6 14.0 - 49.0 %   MONO% 11.9 0.0 - 14.0 %   EOS% 4.9 0.0 - 7.0 %   BASO% 1.9 0.0 - 2.0 %    RADIOGRAPHIC STUDIES: No results found.  ASSESSMENT/PLAN:   1. Essential Thrombocythemia (09/1993).  --Mr. Redford continues to do fairly well on the current treatment program.  --Platelet counts have been quite satisfactory usually in the 300-400,000 range. Platelet count today is 270,000.  2. Anemia secondary to chronic renal disease. --Based on hemoglobin of 9.0 . He will receive Aranesp today 300 mcg subcu today. We will continue to check CBCs  every 2 weeks (from every 3 weeks) and we will give him Aranesp 300 mcg subcu whenever the hemoglobin is less than 11. Erythropoietin level is 33.9. He is asymptomatic at his current level of anemia. We can consider escalating the dose. I ordered a bone marrow aspirate and a biopsy which was scheduled for 03/22/2014 but he called and cancelled that.   3. Shuffling gait, episodic movements. --He will follow up with neurology (Dr. Krista Blue) prn and his next scheduled appointment is 05/2014. He was started on sinemet but it did not improve his symptoms. He was again counseled on fall precautions.   4. Follow-up.  --We will plan to see him again in 2 months, at which time we will check CBC and iron studies.  All  questions were answered. The patient knows to call the clinic with any problems, questions or concerns. We can certainly see the patient much sooner if necessary.  I spent 20 minutes counseling the patient face to face. The total time spent in the appointment was 25 minutes.    Bernadene Bell, MD Medical Hematologist/Oncologist [facility] Pager: [phone] Office No: [phone]

## 2014-04-05 NOTE — Patient Instructions (Signed)
Darbepoetin Alfa injection What is this medicine? DARBEPOETIN ALFA (dar be POE e tin AL fa) helps your body make more red blood cells. It is used to treat anemia caused by chronic kidney failure and chemotherapy. This medicine may be used for other purposes; ask your health care provider or pharmacist if you have questions. COMMON BRAND NAME(S): Aranesp What should I tell my health care provider before I take this medicine? They need to know if you have any of these conditions: -blood clotting disorders or history of blood clots -cancer patient not on chemotherapy -cystic fibrosis -heart disease, such as angina, heart failure, or a history of a heart attack -hemoglobin level of 12 g/dL or greater -high blood pressure -low levels of folate, iron, or vitamin B12 -seizures -an unusual or allergic reaction to darbepoetin, erythropoietin, albumin, hamster proteins, latex, other medicines, foods, dyes, or preservatives -pregnant or trying to get pregnant -breast-feeding How should I use this medicine? This medicine is for injection into a vein or under the skin. It is usually given by a health care professional in a hospital or clinic setting. If you get this medicine at home, you will be taught how to prepare and give this medicine. Do not shake the solution before you withdraw a dose. Use exactly as directed. Take your medicine at regular intervals. Do not take your medicine more often than directed. It is important that you put your used needles and syringes in a special sharps container. Do not put them in a trash can. If you do not have a sharps container, call your pharmacist or healthcare provider to get one. Talk to your pediatrician regarding the use of this medicine in children. While this medicine may be used in children as young as 1 year for selected conditions, precautions do apply. Overdosage: If you think you have taken too much of this medicine contact a poison control center or  emergency room at once. NOTE: This medicine is only for you. Do not share this medicine with others. What if I miss a dose? If you miss a dose, take it as soon as you can. If it is almost time for your next dose, take only that dose. Do not take double or extra doses. What may interact with this medicine? Do not take this medicine with any of the following medications: -epoetin alfa This list may not describe all possible interactions. Give your health care provider a list of all the medicines, herbs, non-prescription drugs, or dietary supplements you use. Also tell them if you smoke, drink alcohol, or use illegal drugs. Some items may interact with your medicine. What should I watch for while using this medicine? Visit your prescriber or health care professional for regular checks on your progress and for the needed blood tests and blood pressure measurements. It is especially important for the doctor to make sure your hemoglobin level is in the desired range, to limit the risk of potential side effects and to give you the best benefit. Keep all appointments for any recommended tests. Check your blood pressure as directed. Ask your doctor what your blood pressure should be and when you should contact him or her. As your body makes more red blood cells, you may need to take iron, folic acid, or vitamin B supplements. Ask your doctor or health care provider which products are right for you. If you have kidney disease continue dietary restrictions, even though this medication can make you feel better. Talk with your doctor or health   care professional about the foods you eat and the vitamins that you take. What side effects may I notice from receiving this medicine? Side effects that you should report to your doctor or health care professional as soon as possible: -allergic reactions like skin rash, itching or hives, swelling of the face, lips, or tongue -breathing problems -changes in vision -chest  pain -confusion, trouble speaking or understanding -feeling faint or lightheaded, falls -high blood pressure -muscle aches or pains -pain, swelling, warmth in the leg -rapid weight gain -severe headaches -sudden numbness or weakness of the face, arm or leg -trouble walking, dizziness, loss of balance or coordination -seizures (convulsions) -swelling of the ankles, feet, hands -unusually weak or tired Side effects that usually do not require medical attention (report to your doctor or health care professional if they continue or are bothersome): -diarrhea -fever, chills (flu-like symptoms) -headaches -nausea, vomiting -redness, stinging, or swelling at site where injected This list may not describe all possible side effects. Call your doctor for medical advice about side effects. You may report side effects to FDA at 1-800-FDA-1088. Where should I keep my medicine? Keep out of the reach of children. Store in a refrigerator between 2 and 8 degrees C (36 and 46 degrees F). Do not freeze. Do not shake. Throw away any unused portion if using a single-dose vial. Throw away any unused medicine after the expiration date. NOTE: This sheet is a summary. It may not cover all possible information. If you have questions about this medicine, talk to your doctor, pharmacist, or health care provider.  2015, [employer]vier/Gold Standard. (2008-05-04 10:23:57)  

## 2014-04-07 ENCOUNTER — Telehealth: Payer: Self-pay | Admitting: Physician Assistant

## 2014-04-07 NOTE — Telephone Encounter (Signed)
[name] W/ Dr. Wynonia Lawman office called asking For Old Cardiac Office Noted per her Doctor i rec Chart From [facility] there Are No Old cardiac  Ov notes in the Chart, I left  VM With [name] Letting her Know this. Chart Sent back to [facility]

## 2014-04-19 ENCOUNTER — Other Ambulatory Visit (HOSPITAL_BASED_OUTPATIENT_CLINIC_OR_DEPARTMENT_OTHER): Payer: Medicare Other

## 2014-04-19 ENCOUNTER — Ambulatory Visit (HOSPITAL_BASED_OUTPATIENT_CLINIC_OR_DEPARTMENT_OTHER): Payer: Medicare Other

## 2014-04-19 DIAGNOSIS — D631 Anemia in chronic kidney disease: Secondary | ICD-10-CM

## 2014-04-19 DIAGNOSIS — N189 Chronic kidney disease, unspecified: Secondary | ICD-10-CM

## 2014-04-19 DIAGNOSIS — N181 Chronic kidney disease, stage 1: Principal | ICD-10-CM

## 2014-04-19 LAB — CBC WITH DIFFERENTIAL/PLATELET
BASO%: 0.7 % (ref 0.0–2.0)
BASOS ABS: 0 10*3/uL (ref 0.0–0.1)
EOS ABS: 0.2 10*3/uL (ref 0.0–0.5)
EOS%: 3.7 % (ref 0.0–7.0)
HEMATOCRIT: 27.9 % — AB (ref 38.4–49.9)
HEMOGLOBIN: 8.5 g/dL — AB (ref 13.0–17.1)
LYMPH%: 15.7 % (ref 14.0–49.0)
MCH: 26.6 pg — ABNORMAL LOW (ref 27.2–33.4)
MCHC: 30.5 g/dL — ABNORMAL LOW (ref 32.0–36.0)
MCV: 87.5 fL (ref 79.3–98.0)
MONO#: 0.5 10*3/uL (ref 0.1–0.9)
MONO%: 10.9 % (ref 0.0–14.0)
NEUT#: 3.2 10*3/uL (ref 1.5–6.5)
NEUT%: 69 % (ref 39.0–75.0)
Platelets: 236 10*3/uL (ref 140–400)
RBC: 3.19 10*6/uL — ABNORMAL LOW (ref 4.20–5.82)
RDW: 17.4 % — ABNORMAL HIGH (ref 11.0–14.6)
WBC: 4.6 10*3/uL (ref 4.0–10.3)
lymph#: 0.7 10*3/uL — ABNORMAL LOW (ref 0.9–3.3)

## 2014-04-19 MED ORDER — DARBEPOETIN ALFA 300 MCG/0.6ML IJ SOSY
300.0000 ug | PREFILLED_SYRINGE | Freq: Once | INTRAMUSCULAR | Status: AC
  Administered 2014-04-19: 300 ug via SUBCUTANEOUS
  Filled 2014-04-19: qty 0.6

## 2014-04-19 NOTE — Patient Instructions (Signed)
Darbepoetin Alfa injection What is this medicine? DARBEPOETIN ALFA (dar be POE e tin AL fa) helps your body make more red blood cells. It is used to treat anemia caused by chronic kidney failure and chemotherapy. This medicine may be used for other purposes; ask your health care provider or pharmacist if you have questions. COMMON BRAND NAME(S): Aranesp What should I tell my health care provider before I take this medicine? They need to know if you have any of these conditions: -blood clotting disorders or history of blood clots -cancer patient not on chemotherapy -cystic fibrosis -heart disease, such as angina, heart failure, or a history of a heart attack -hemoglobin level of 12 g/dL or greater -high blood pressure -low levels of folate, iron, or vitamin B12 -seizures -an unusual or allergic reaction to darbepoetin, erythropoietin, albumin, hamster proteins, latex, other medicines, foods, dyes, or preservatives -pregnant or trying to get pregnant -breast-feeding How should I use this medicine? This medicine is for injection into a vein or under the skin. It is usually given by a health care professional in a hospital or clinic setting. If you get this medicine at home, you will be taught how to prepare and give this medicine. Do not shake the solution before you withdraw a dose. Use exactly as directed. Take your medicine at regular intervals. Do not take your medicine more often than directed. It is important that you put your used needles and syringes in a special sharps container. Do not put them in a trash can. If you do not have a sharps container, call your pharmacist or healthcare provider to get one. Talk to your pediatrician regarding the use of this medicine in children. While this medicine may be used in children as young as 1 year for selected conditions, precautions do apply. Overdosage: If you think you have taken too much of this medicine contact a poison control center or  emergency room at once. NOTE: This medicine is only for you. Do not share this medicine with others. What if I miss a dose? If you miss a dose, take it as soon as you can. If it is almost time for your next dose, take only that dose. Do not take double or extra doses. What may interact with this medicine? Do not take this medicine with any of the following medications: -epoetin alfa This list may not describe all possible interactions. Give your health care provider a list of all the medicines, herbs, non-prescription drugs, or dietary supplements you use. Also tell them if you smoke, drink alcohol, or use illegal drugs. Some items may interact with your medicine. What should I watch for while using this medicine? Visit your prescriber or health care professional for regular checks on your progress and for the needed blood tests and blood pressure measurements. It is especially important for the doctor to make sure your hemoglobin level is in the desired range, to limit the risk of potential side effects and to give you the best benefit. Keep all appointments for any recommended tests. Check your blood pressure as directed. Ask your doctor what your blood pressure should be and when you should contact him or her. As your body makes more red blood cells, you may need to take iron, folic acid, or vitamin B supplements. Ask your doctor or health care provider which products are right for you. If you have kidney disease continue dietary restrictions, even though this medication can make you feel better. Talk with your doctor or health   care professional about the foods you eat and the vitamins that you take. What side effects may I notice from receiving this medicine? Side effects that you should report to your doctor or health care professional as soon as possible: -allergic reactions like skin rash, itching or hives, swelling of the face, lips, or tongue -breathing problems -changes in vision -chest  pain -confusion, trouble speaking or understanding -feeling faint or lightheaded, falls -high blood pressure -muscle aches or pains -pain, swelling, warmth in the leg -rapid weight gain -severe headaches -sudden numbness or weakness of the face, arm or leg -trouble walking, dizziness, loss of balance or coordination -seizures (convulsions) -swelling of the ankles, feet, hands -unusually weak or tired Side effects that usually do not require medical attention (report to your doctor or health care professional if they continue or are bothersome): -diarrhea -fever, chills (flu-like symptoms) -headaches -nausea, vomiting -redness, stinging, or swelling at site where injected This list may not describe all possible side effects. Call your doctor for medical advice about side effects. You may report side effects to FDA at 1-800-FDA-1088. Where should I keep my medicine? Keep out of the reach of children. Store in a refrigerator between 2 and 8 degrees C (36 and 46 degrees F). Do not freeze. Do not shake. Throw away any unused portion if using a single-dose vial. Throw away any unused medicine after the expiration date. NOTE: This sheet is a summary. It may not cover all possible information. If you have questions about this medicine, talk to your doctor, pharmacist, or health care provider.  2015, [employer]vier/Gold Standard. (2008-05-04 10:23:57)  

## 2014-04-21 ENCOUNTER — Encounter: Payer: Self-pay | Admitting: Vascular Surgery

## 2014-04-22 ENCOUNTER — Ambulatory Visit (INDEPENDENT_AMBULATORY_CARE_PROVIDER_SITE_OTHER): Payer: Medicare Other | Admitting: Vascular Surgery

## 2014-04-22 ENCOUNTER — Ambulatory Visit (HOSPITAL_COMMUNITY)
Admission: RE | Admit: 2014-04-22 | Discharge: 2014-04-22 | Disposition: A | Discharge status: Home / Self Care | Payer: Medicare Other | Source: Ambulatory Visit | Attending: Vascular Surgery | Admitting: Vascular Surgery

## 2014-04-22 ENCOUNTER — Encounter: Payer: Self-pay | Admitting: Vascular Surgery

## 2014-04-22 VITALS — BP 124/52 | HR 64 | Temp 97.8°F | Resp 16 | Ht 68.0 in | Wt 158.0 lb

## 2014-04-22 DIAGNOSIS — Z4931 Encounter for adequacy testing for hemodialysis: Secondary | ICD-10-CM | POA: Diagnosis not present

## 2014-04-22 DIAGNOSIS — N186 End stage renal disease: Secondary | ICD-10-CM

## 2014-04-22 DIAGNOSIS — I2510 Atherosclerotic heart disease of native coronary artery without angina pectoris: Secondary | ICD-10-CM

## 2014-04-22 NOTE — Progress Notes (Signed)
Patient returns for follow-up today. He had a left radiocephalic AV fistula placed on June 30. He is currently not on dialysis.  He has no numbness or tingling in his hand.  Review of systems: He denies skin itching. He denies shortness of breath.  Physical exam:  Filed Vitals:   04/22/14 1532  BP: 124/52  Pulse: 64  Temp: 97.8 F (36.6 C)  TempSrc: Oral  Resp: 16  Height: 5\' 8"  (1.727 m)  Weight: 158 lb (71.668 kg)  SpO2: 98%    Fistula was visible throughout the left forearm. Easily palpable thrill. Audible bruit. He  Data: Fistula diameter today was 7 mm throughout most of its course less than 2 mm from the skin surface  Assessment: Patent left arm AV fistula should be ready for use when necessary  Plan: Follow-up as needed  Ruta Hinds, MD [facility] Office: [phone] Pager: [phone]

## 2014-04-26 ENCOUNTER — Other Ambulatory Visit: Payer: Self-pay | Admitting: Internal Medicine

## 2014-05-03 ENCOUNTER — Ambulatory Visit (HOSPITAL_BASED_OUTPATIENT_CLINIC_OR_DEPARTMENT_OTHER): Payer: Medicare Other

## 2014-05-03 ENCOUNTER — Other Ambulatory Visit (HOSPITAL_BASED_OUTPATIENT_CLINIC_OR_DEPARTMENT_OTHER): Payer: Medicare Other

## 2014-05-03 ENCOUNTER — Other Ambulatory Visit: Payer: Self-pay | Admitting: *Deleted

## 2014-05-03 DIAGNOSIS — D473 Essential (hemorrhagic) thrombocythemia: Secondary | ICD-10-CM

## 2014-05-03 DIAGNOSIS — N189 Chronic kidney disease, unspecified: Secondary | ICD-10-CM

## 2014-05-03 DIAGNOSIS — D631 Anemia in chronic kidney disease: Secondary | ICD-10-CM

## 2014-05-03 LAB — CBC WITH DIFFERENTIAL/PLATELET
BASO%: 1.3 % (ref 0.0–2.0)
BASOS ABS: 0.1 10*3/uL (ref 0.0–0.1)
EOS%: 4 % (ref 0.0–7.0)
Eosinophils Absolute: 0.2 10*3/uL (ref 0.0–0.5)
HCT: 27.1 % — ABNORMAL LOW (ref 38.4–49.9)
HEMOGLOBIN: 8.2 g/dL — AB (ref 13.0–17.1)
LYMPH%: 15.2 % (ref 14.0–49.0)
MCH: 26.4 pg — AB (ref 27.2–33.4)
MCHC: 30.3 g/dL — ABNORMAL LOW (ref 32.0–36.0)
MCV: 86.9 fL (ref 79.3–98.0)
MONO#: 0.6 10*3/uL (ref 0.1–0.9)
MONO%: 11.5 % (ref 0.0–14.0)
NEUT#: 3.3 10*3/uL (ref 1.5–6.5)
NEUT%: 68 % (ref 39.0–75.0)
PLATELETS: 288 10*3/uL (ref 140–400)
RBC: 3.12 10*6/uL — AB (ref 4.20–5.82)
RDW: 18.4 % — AB (ref 11.0–14.6)
WBC: 4.8 10*3/uL (ref 4.0–10.3)
lymph#: 0.7 10*3/uL — ABNORMAL LOW (ref 0.9–3.3)

## 2014-05-03 MED ORDER — ANAGRELIDE HCL 1 MG PO CAPS: ORAL_CAPSULE | ORAL | Status: DC

## 2014-05-03 MED ORDER — DARBEPOETIN ALFA 300 MCG/0.6ML IJ SOSY
300.0000 ug | PREFILLED_SYRINGE | Freq: Once | INTRAMUSCULAR | Status: AC
  Administered 2014-05-03: 300 ug via SUBCUTANEOUS
  Filled 2014-05-03: qty 0.6

## 2014-05-07 ENCOUNTER — Telehealth: Payer: Self-pay | Admitting: Hematology

## 2014-05-07 NOTE — Telephone Encounter (Signed)
moved CP1 12/14 to 12/28 w/YF - lb/inj remain 12/14 (q2w) - f/u should be 43mos from last visit. added lb/inj to 05/31/14 appt. lmonvm for pt and mailed schedule.

## 2014-05-11 ENCOUNTER — Telehealth: Payer: Self-pay | Admitting: Hematology

## 2014-05-11 NOTE — Telephone Encounter (Signed)
pt wife called back - returned call and confirmed appts for 12/14 and 12/28. wife aware that schedule has been mailed.

## 2014-05-11 NOTE — Telephone Encounter (Signed)
received message from Fraser in gynonc that she got a message from phone number [phone] and could only make out the name Sayvion and that they were trying to return a call to me. lmonvm at pt's home that i was returning a call. also gv next appt for 12/14 and asked for a call back should pt have additional questions.

## 2014-05-17 ENCOUNTER — Ambulatory Visit (HOSPITAL_BASED_OUTPATIENT_CLINIC_OR_DEPARTMENT_OTHER): Payer: Medicare Other

## 2014-05-17 ENCOUNTER — Other Ambulatory Visit (HOSPITAL_BASED_OUTPATIENT_CLINIC_OR_DEPARTMENT_OTHER): Payer: Medicare Other

## 2014-05-17 ENCOUNTER — Ambulatory Visit: Payer: Medicare Other

## 2014-05-17 DIAGNOSIS — D631 Anemia in chronic kidney disease: Secondary | ICD-10-CM | POA: Diagnosis not present

## 2014-05-17 DIAGNOSIS — N181 Chronic kidney disease, stage 1: Principal | ICD-10-CM

## 2014-05-17 DIAGNOSIS — N189 Chronic kidney disease, unspecified: Secondary | ICD-10-CM | POA: Diagnosis not present

## 2014-05-17 LAB — CBC WITH DIFFERENTIAL/PLATELET
BASO%: 0.7 % (ref 0.0–2.0)
Basophils Absolute: 0 10e3/uL (ref 0.0–0.1)
EOS%: 4.3 % (ref 0.0–7.0)
Eosinophils Absolute: 0.2 10e3/uL (ref 0.0–0.5)
HCT: 25.8 % — ABNORMAL LOW (ref 38.4–49.9)
HGB: 7.8 g/dL — ABNORMAL LOW (ref 13.0–17.1)
LYMPH%: 14.4 % (ref 14.0–49.0)
MCH: 26.4 pg — ABNORMAL LOW (ref 27.2–33.4)
MCHC: 30.2 g/dL — ABNORMAL LOW (ref 32.0–36.0)
MCV: 87.5 fL (ref 79.3–98.0)
MONO#: 0.5 10e3/uL (ref 0.1–0.9)
MONO%: 9.5 % (ref 0.0–14.0)
NEUT#: 3.8 10e3/uL (ref 1.5–6.5)
NEUT%: 71.1 % (ref 39.0–75.0)
Platelets: 207 10e3/uL (ref 140–400)
RBC: 2.95 10e6/uL — ABNORMAL LOW (ref 4.20–5.82)
RDW: 17.2 % — ABNORMAL HIGH (ref 11.0–14.6)
WBC: 5.4 10e3/uL (ref 4.0–10.3)
lymph#: 0.8 10e3/uL — ABNORMAL LOW (ref 0.9–3.3)

## 2014-05-17 MED ORDER — DARBEPOETIN ALFA 300 MCG/0.6ML IJ SOSY
300.0000 ug | PREFILLED_SYRINGE | Freq: Once | INTRAMUSCULAR | Status: AC
  Administered 2014-05-17: 300 ug via SUBCUTANEOUS
  Filled 2014-05-17: qty 0.6

## 2014-05-17 NOTE — Patient Instructions (Signed)
Darbepoetin Alfa injection What is this medicine? DARBEPOETIN ALFA (dar be POE e tin AL fa) helps your body make more red blood cells. It is used to treat anemia caused by chronic kidney failure and chemotherapy. This medicine may be used for other purposes; ask your health care provider or pharmacist if you have questions. COMMON BRAND NAME(S): Aranesp What should I tell my health care provider before I take this medicine? They need to know if you have any of these conditions: -blood clotting disorders or history of blood clots -cancer patient not on chemotherapy -cystic fibrosis -heart disease, such as angina, heart failure, or a history of a heart attack -hemoglobin level of 12 g/dL or greater -high blood pressure -low levels of folate, iron, or vitamin B12 -seizures -an unusual or allergic reaction to darbepoetin, erythropoietin, albumin, hamster proteins, latex, other medicines, foods, dyes, or preservatives -pregnant or trying to get pregnant -breast-feeding How should I use this medicine? This medicine is for injection into a vein or under the skin. It is usually given by a health care professional in a hospital or clinic setting. If you get this medicine at home, you will be taught how to prepare and give this medicine. Do not shake the solution before you withdraw a dose. Use exactly as directed. Take your medicine at regular intervals. Do not take your medicine more often than directed. It is important that you put your used needles and syringes in a special sharps container. Do not put them in a trash can. If you do not have a sharps container, call your pharmacist or healthcare provider to get one. Talk to your pediatrician regarding the use of this medicine in children. While this medicine may be used in children as young as 1 year for selected conditions, precautions do apply. Overdosage: If you think you have taken too much of this medicine contact a poison control center or  emergency room at once. NOTE: This medicine is only for you. Do not share this medicine with others. What if I miss a dose? If you miss a dose, take it as soon as you can. If it is almost time for your next dose, take only that dose. Do not take double or extra doses. What may interact with this medicine? Do not take this medicine with any of the following medications: -epoetin alfa This list may not describe all possible interactions. Give your health care provider a list of all the medicines, herbs, non-prescription drugs, or dietary supplements you use. Also tell them if you smoke, drink alcohol, or use illegal drugs. Some items may interact with your medicine. What should I watch for while using this medicine? Visit your prescriber or health care professional for regular checks on your progress and for the needed blood tests and blood pressure measurements. It is especially important for the doctor to make sure your hemoglobin level is in the desired range, to limit the risk of potential side effects and to give you the best benefit. Keep all appointments for any recommended tests. Check your blood pressure as directed. Ask your doctor what your blood pressure should be and when you should contact him or her. As your body makes more red blood cells, you may need to take iron, folic acid, or vitamin B supplements. Ask your doctor or health care provider which products are right for you. If you have kidney disease continue dietary restrictions, even though this medication can make you feel better. Talk with your doctor or health   care professional about the foods you eat and the vitamins that you take. What side effects may I notice from receiving this medicine? Side effects that you should report to your doctor or health care professional as soon as possible: -allergic reactions like skin rash, itching or hives, swelling of the face, lips, or tongue -breathing problems -changes in vision -chest  pain -confusion, trouble speaking or understanding -feeling faint or lightheaded, falls -high blood pressure -muscle aches or pains -pain, swelling, warmth in the leg -rapid weight gain -severe headaches -sudden numbness or weakness of the face, arm or leg -trouble walking, dizziness, loss of balance or coordination -seizures (convulsions) -swelling of the ankles, feet, hands -unusually weak or tired Side effects that usually do not require medical attention (report to your doctor or health care professional if they continue or are bothersome): -diarrhea -fever, chills (flu-like symptoms) -headaches -nausea, vomiting -redness, stinging, or swelling at site where injected This list may not describe all possible side effects. Call your doctor for medical advice about side effects. You may report side effects to FDA at 1-800-FDA-1088. Where should I keep my medicine? Keep out of the reach of children. Store in a refrigerator between 2 and 8 degrees C (36 and 46 degrees F). Do not freeze. Do not shake. Throw away any unused portion if using a single-dose vial. Throw away any unused medicine after the expiration date. NOTE: This sheet is a summary. It may not cover all possible information. If you have questions about this medicine, talk to your doctor, pharmacist, or health care provider.  2015, [employer]vier/Gold Standard. (2008-05-04 10:23:57)  

## 2014-05-17 NOTE — Progress Notes (Signed)
Pt denies any complaints of light headedness, SOB, or fatigue.  Aranesp injection given and pt told to call with any concerns.  Pt verbalizes understanding.

## 2014-05-18 ENCOUNTER — Ambulatory Visit (HOSPITAL_BASED_OUTPATIENT_CLINIC_OR_DEPARTMENT_OTHER): Payer: Medicare Other

## 2014-05-18 ENCOUNTER — Ambulatory Visit: Payer: Medicare Other

## 2014-05-18 ENCOUNTER — Ambulatory Visit (HOSPITAL_COMMUNITY)
Admission: RE | Admit: 2014-05-18 | Discharge: 2014-05-18 | Disposition: A | Discharge status: Home / Self Care | Payer: Medicare Other | Source: Ambulatory Visit | Attending: Internal Medicine | Admitting: Internal Medicine

## 2014-05-18 ENCOUNTER — Telehealth: Payer: Self-pay | Admitting: *Deleted

## 2014-05-18 ENCOUNTER — Other Ambulatory Visit: Payer: Self-pay | Admitting: *Deleted

## 2014-05-18 VITALS — BP 149/54 | HR 52 | Temp 98.4°F | Resp 20

## 2014-05-18 DIAGNOSIS — D631 Anemia in chronic kidney disease: Secondary | ICD-10-CM

## 2014-05-18 DIAGNOSIS — D638 Anemia in other chronic diseases classified elsewhere: Secondary | ICD-10-CM

## 2014-05-18 DIAGNOSIS — N189 Chronic kidney disease, unspecified: Principal | ICD-10-CM

## 2014-05-18 LAB — PREPARE RBC (CROSSMATCH)

## 2014-05-18 MED ORDER — ACETAMINOPHEN 325 MG PO TABS
650.0000 mg | ORAL_TABLET | Freq: Once | ORAL | Status: AC
  Administered 2014-05-18: 650 mg via ORAL

## 2014-05-18 MED ORDER — ACETAMINOPHEN 325 MG PO TABS
ORAL_TABLET | ORAL | Status: AC
  Filled 2014-05-18: qty 2

## 2014-05-18 MED ORDER — FUROSEMIDE 20 MG PO TABS
40.0000 mg | ORAL_TABLET | Freq: Once | ORAL | Status: AC
  Administered 2014-05-18: 40 mg via ORAL

## 2014-05-18 MED ORDER — SODIUM CHLORIDE 0.9 % IV SOLN
250.0000 mL | Freq: Once | INTRAVENOUS | Status: AC
  Administered 2014-05-18: 250 mL via INTRAVENOUS

## 2014-05-18 MED ORDER — DIPHENHYDRAMINE HCL 25 MG PO CAPS
25.0000 mg | ORAL_CAPSULE | Freq: Once | ORAL | Status: AC
  Administered 2014-05-18: 25 mg via ORAL

## 2014-05-18 MED ORDER — DIPHENHYDRAMINE HCL 25 MG PO CAPS
ORAL_CAPSULE | ORAL | Status: AC
  Filled 2014-05-18: qty 1

## 2014-05-18 NOTE — Progress Notes (Signed)
Pt reports his nephrologist prescribed Lasix for his kidneys and blood pressure. Usually takes dose at 1400. Per Dr. Burr Medico: OK to give Lasix 40 mg PO while pt here for transfusion.

## 2014-05-18 NOTE — Telephone Encounter (Signed)
Pt called and left message requesting appt for blood transfusion today.  Pt received Aranesp 300 mcg on 05/17/14.  Pt stated he had no symptoms with low hemoglobin; however, pt was told by his nephrologist that with low hemoglobin, his other organs will be affected, and that pt should consider blood transfusion supptort.   Pt stated he would like to be set up for blood transfusion today if possible. Pt's  Phone    [phone].

## 2014-05-18 NOTE — Patient Instructions (Signed)

## 2014-05-18 NOTE — Telephone Encounter (Signed)
Spoke with pt and instructed pt to come in now for blood draw and blood transfusion today.  Pt voiced undersrtanding.

## 2014-05-19 ENCOUNTER — Ambulatory Visit: Payer: Medicare Other | Admitting: Neurology

## 2014-05-19 LAB — TYPE AND SCREEN
ABO/RH(D): A POS
ANTIBODY SCREEN: NEGATIVE
UNIT DIVISION: 0
Unit division: 0

## 2014-05-31 ENCOUNTER — Other Ambulatory Visit (HOSPITAL_BASED_OUTPATIENT_CLINIC_OR_DEPARTMENT_OTHER): Payer: Medicare Other

## 2014-05-31 ENCOUNTER — Telehealth: Payer: Self-pay | Admitting: Hematology and Oncology

## 2014-05-31 ENCOUNTER — Ambulatory Visit (HOSPITAL_BASED_OUTPATIENT_CLINIC_OR_DEPARTMENT_OTHER): Payer: Medicare Other | Admitting: Hematology

## 2014-05-31 ENCOUNTER — Ambulatory Visit (HOSPITAL_BASED_OUTPATIENT_CLINIC_OR_DEPARTMENT_OTHER): Payer: Medicare Other

## 2014-05-31 ENCOUNTER — Encounter: Payer: Self-pay | Admitting: Hematology

## 2014-05-31 VITALS — BP 127/63 | HR 122 | Temp 97.7°F | Resp 19 | Ht 68.0 in | Wt 158.9 lb

## 2014-05-31 DIAGNOSIS — N189 Chronic kidney disease, unspecified: Secondary | ICD-10-CM

## 2014-05-31 DIAGNOSIS — D649 Anemia, unspecified: Secondary | ICD-10-CM

## 2014-05-31 DIAGNOSIS — D631 Anemia in chronic kidney disease: Secondary | ICD-10-CM

## 2014-05-31 DIAGNOSIS — D509 Iron deficiency anemia, unspecified: Secondary | ICD-10-CM

## 2014-05-31 DIAGNOSIS — D473 Essential (hemorrhagic) thrombocythemia: Secondary | ICD-10-CM

## 2014-05-31 DIAGNOSIS — D539 Nutritional anemia, unspecified: Secondary | ICD-10-CM

## 2014-05-31 LAB — CBC & DIFF AND RETIC
BASO%: 0.6 % (ref 0.0–2.0)
BASOS ABS: 0 10*3/uL (ref 0.0–0.1)
EOS%: 5.7 % (ref 0.0–7.0)
Eosinophils Absolute: 0.2 10*3/uL (ref 0.0–0.5)
HCT: 27.9 % — ABNORMAL LOW (ref 38.4–49.9)
HGB: 8.5 g/dL — ABNORMAL LOW (ref 13.0–17.1)
IMMATURE RETIC FRACT: 8.8 % (ref 3.00–10.60)
LYMPH%: 26.1 % (ref 14.0–49.0)
MCH: 26.7 pg — ABNORMAL LOW (ref 27.2–33.4)
MCHC: 30.5 g/dL — ABNORMAL LOW (ref 32.0–36.0)
MCV: 87.7 fL (ref 79.3–98.0)
MONO#: 0.4 10*3/uL (ref 0.1–0.9)
MONO%: 10.1 % (ref 0.0–14.0)
NEUT%: 57.5 % (ref 39.0–75.0)
NEUTROS ABS: 2 10*3/uL (ref 1.5–6.5)
PLATELETS: 243 10*3/uL (ref 140–400)
RBC: 3.18 10*6/uL — AB (ref 4.20–5.82)
RDW: 17 % — ABNORMAL HIGH (ref 11.0–14.6)
Retic %: 2.11 % — ABNORMAL HIGH (ref 0.80–1.80)
Retic Ct Abs: 67.1 10*3/uL (ref 34.80–93.90)
WBC: 3.5 10*3/uL — ABNORMAL LOW (ref 4.0–10.3)
lymph#: 0.9 10*3/uL (ref 0.9–3.3)

## 2014-05-31 LAB — IRON AND TIBC CHCC
%SAT: 17 % — AB (ref 20–55)
Iron: 52 ug/dL (ref 42–163)
TIBC: 315 ug/dL (ref 202–409)
UIBC: 263 ug/dL (ref 117–376)

## 2014-05-31 LAB — FERRITIN CHCC: Ferritin: 22 ng/ml (ref 22–316)

## 2014-05-31 MED ORDER — DARBEPOETIN ALFA 300 MCG/0.6ML IJ SOSY
300.0000 ug | PREFILLED_SYRINGE | Freq: Once | INTRAMUSCULAR | Status: AC
  Administered 2014-05-31: 300 ug via SUBCUTANEOUS
  Filled 2014-05-31: qty 0.6

## 2014-05-31 NOTE — Telephone Encounter (Signed)
, °

## 2014-05-31 NOTE — Progress Notes (Signed)
[facility] HEMATOLOGY OFFICE PROGRESS NOTE DATE OF VISIT: 04/05/2014  Bryan Penna, MD [address] [place] [address]  DIAGNOSIS: Essential thrombocythemia  Anemia, unspecified anemia type  Chief Complaint  Patient presents with  . Follow-up    anemia    CURRENT THERAPY:  1. Aspirin 81 mg by mouth daily.  2. Anagrelide (Agrylin) 1 mg by mouth daily.  3. Aranesp 300 mcg subcu every 2 weeks for hemoglobin less than 11.   INTERVAL HISTORY:  [name] 80 y.o. male history of essential thrombocythemia and anemia due to chronic renal failure treated with erythropoietin is here for 2 month follow-up. He was last seen by Dr. Rosie Fate, who has left the practice.  Her returns for follow up. He received blood transfusion before christmas (total of 3 in the last 3 years), and felt better. His energy level is moderate, but remains active at home. 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 No chest pain, chronic joints pain is stable, no other complains/.     MEDICAL HISTORY: Past Medical History  Diagnosis Date  . Chronic kidney disease   . Thrombocythemia   . Esophageal stricture   . Anemia   . Anemia due to chronic renal failure treated with erythropoietin 03/24/2011  . Retinal vein thrombosis, left   . Coronary artery disease   . Hypertension   . GERD (gastroesophageal reflux disease)   . Arthritis   . Anginal pain   . Erosive esophagitis     grade 4  . Diverticulosis   . Internal hemorrhoids     INTERIM HISTORY: has CAD; STRICTURE AND STENOSIS OF ESOPHAGUS; ESOPHAGEAL REFLUX; Essential thrombocythemia; Anemia due to chronic renal failure treated with erythropoietin; Retinal vein thrombosis, left; Murmur; CKD (chronic kidney disease); Ischemic cardiomyopathy; Essential hypertension; Abnormal involuntary movement; Abnormality of gait; End stage renal disease; Anemia; Heme + stool; Erosive esophagitis; Dyspnea on  exertion; and Prescription drug abuse on his problem list.    ALLERGIES:  is allergic to isosorbide and norvasc.  MEDICATIONS: has a current medication list which includes the following prescription(s): acyclovir, alprazolam, anagrelide, aspirin, carboxymethylcellulose, clonidine, fluticasone, furosemide, hydralazine, ketoconazole, metoprolol succinate, oxycodone, pantoprazole, proventil hfa, psyllium, tamsulosin, triamcinolone cream, and zolpidem.  SURGICAL HISTORY:  Past Surgical History  Procedure Laterality Date  . Replacement total knee Right 04/19/08  . Av fistula placement Left 12/01/2013    Procedure: RADIOCEPHALIC ARTERIOVENOUS (AV) FISTULA CREATION;  Surgeon: Sherren Kerns, MD;  Location: [facility] OR;  Service: Vascular;  Laterality: Left;  . Coronary artery bypass graft  2008    x 5  . Eye surgery Left yrs ago    vitrectomy  . Eye brow lift Right     01-11-2014  . Inguinal hernia repair Left 05/08/01  . Colonoscopy N/A 02/02/2014    Procedure: COLONOSCOPY;  Surgeon: Louis Meckel, MD;  Location: [facility];  Service: Endoscopy;  Laterality: N/A;  . Esophagogastroduodenoscopy N/A 02/02/2014    Procedure: ESOPHAGOGASTRODUODENOSCOPY (EGD);  Surgeon: Louis Meckel, MD;  Location: [facility];  Service: Endoscopy;  Laterality: N/A;    REVIEW OF SYSTEMS:   Constitutional: Denies fevers, chills or abnormal weight loss Eyes: Denies blurriness of vision Ears, nose, mouth, throat, and face: Denies mucositis or sore throat Respiratory: Denies cough, dyspnea or wheezes Cardiovascular: Denies palpitation, chest discomfort or lower extremity swelling Gastrointestinal:  Denies nausea, heartburn or change in bowel habits Skin: Denies abnormal skin rashes Lymphatics: Denies new  lymphadenopathy or easy bruising Neurological:Denies numbness, tingling or new weaknesses. Balance has improved a little Behavioral/Psych: Mood is stable, no new changes  All other systems were reviewed with the  patient and are negative.  PHYSICAL EXAMINATION: ECOG PERFORMANCE STATUS: 1  Blood pressure 127/63, pulse 122, temperature 97.7 F (36.5 C), temperature source Oral, resp. rate 19, height $RemoveBe'5\' 8"'xQhrJNqEb$  (1.727 m), weight 158 lb 14.4 oz (72.077 kg), SpO2 88 %.  GENERAL:alert, no distress and comfortable, elderly male; moves limbs spontaneously through out exams SKIN: skin color, texture, turgor are normal, no rashes EYES: normal, Conjunctiva are pink and non-injected, sclera clear OROPHARYNX:no exudate, no erythema and lips, buccal mucosa, and tongue normal  NECK: supple, thyroid normal size, non-tender, without nodularity LYMPH:  no palpable lymphadenopathy in the cervical, axillary or inguinal LUNGS: clear to auscultation and percussion with normal breathing effort HEART: regular rate & rhythm and no murmurs and no lower extremity edema ABDOMEN:abdomen soft, non-tender and normal bowel sounds Musculoskeletal:no peripheral edema NEURO: alert & oriented x 3 with fluent speech, no focal motor/sensory deficits; shuffling gait noted.    LABORATORY DATA: CBC Latest Ref Rng 05/31/2014 05/17/2014 05/03/2014  WBC 4.0 - 10.3 10e3/uL 3.5(L) 5.4 4.8  Hemoglobin 13.0 - 17.1 g/dL 8.5(L) 7.8(L) 8.2(L)  Hematocrit 38.4 - 49.9 % 27.9(L) 25.8(L) 27.1(L)  Platelets 140 - 400 10e3/uL 243 207 288    CMP Latest Ref Rng 03/08/2014 01/11/2014 12/01/2013  Glucose 70 - 140 mg/dl 112 97 101(H)  BUN 7.0 - 26.0 mg/dL 79.0(H) 93(H) -  Creatinine 0.7 - 1.3 mg/dL 2.5(H) 2.86(H) -  Sodium 136 - 145 mEq/L 133(L) 139 134(L)  Potassium 3.5 - 5.1 mEq/L 4.3 4.9 4.7  Chloride 96 - 112 mEq/L - 102 -  CO2 22 - 29 mEq/L 24 22 -  Calcium 8.4 - 10.4 mg/dL 8.6 8.7 -  Total Protein 6.4 - 8.3 g/dL 5.6(L) - -  Albumin 3.5 - 4.8 g/dL - - -  Total Bilirubin 0.20 - 1.20 mg/dL 0.31 - -  Alkaline Phos 40 - 150 U/L 77 - -  AST 5 - 34 U/L 13 - -  ALT 0 - 55 U/L 14 - -      RADIOGRAPHIC STUDIES: No results found.  ASSESSMENT/PLAN:    1. Essential Thrombocythemia (09/1993).  Jak2 V617F, exon 12 and 13 mutation (-)  --[name] continues to do fairly well on the current treatment program.  --Platelet counts have been quite satisfactory usually in the 200-300,000 range. Platelet count today is 243,000.  2. Anemia secondary to chronic renal disease and iron deficiency. --His hemoglobin today is 98.5 . He will receive Aranesp today 300 mcg subcu today. We will continue to check CBCs every 2 weeks (from every 3 weeks) and we will give him Aranesp 300 mcg subcu whenever the hemoglobin is less than 11. Erythropoietin level is 33.9. He is asymptomatic at his current level of anemia. We can consider escalating the dose. If need. -history anemia seems not responding to Aranesp well, and slightly worse lately. Bone marrow biopsy was discussed with patient before and he declined. I discussed it with him again today, mainly to ruled out primary marrow disease such as MDS. However, he declines again due to his advanced age and concern of side effects associated with procedure.  -His ferritin is 22 today, with the low iron saturation at 17%, I would schedule him for IV Feraheme $RemoveBef'510mg'BmjWvEupJL$  twice.   3. Shuffling gait, episodic movements. --He will follow up with his neurology.  He is on sinemet. He was again counseled on fall precautions.   4. Follow-up.  --We will plan to see him again in 2 months, at which time we will check CBC and iron studies.  All questions were answered. The patient knows to call the clinic with any problems, questions or concerns. We can certainly see the patient much sooner if necessary.  I spent 20 minutes counseling the patient face to face. The total time spent in the appointment was 25 minutes.    Plan: -Feraheme 510 mg twice, with 2 weeks apart -continue Aranesp 374mcg q2w -RTC in 4 weeks  Truitt Merle 05/31/2014

## 2014-05-31 NOTE — Patient Instructions (Signed)
Darbepoetin Alfa injection What is this medicine? DARBEPOETIN ALFA (dar be POE e tin AL fa) helps your body make more red blood cells. It is used to treat anemia caused by chronic kidney failure and chemotherapy. This medicine may be used for other purposes; ask your health care provider or pharmacist if you have questions. COMMON BRAND NAME(S): Aranesp What should I tell my health care provider before I take this medicine? They need to know if you have any of these conditions: -blood clotting disorders or history of blood clots -cancer patient not on chemotherapy -cystic fibrosis -heart disease, such as angina, heart failure, or a history of a heart attack -hemoglobin level of 12 g/dL or greater -high blood pressure -low levels of folate, iron, or vitamin B12 -seizures -an unusual or allergic reaction to darbepoetin, erythropoietin, albumin, hamster proteins, latex, other medicines, foods, dyes, or preservatives -pregnant or trying to get pregnant -breast-feeding How should I use this medicine? This medicine is for injection into a vein or under the skin. It is usually given by a health care professional in a hospital or clinic setting. If you get this medicine at home, you will be taught how to prepare and give this medicine. Do not shake the solution before you withdraw a dose. Use exactly as directed. Take your medicine at regular intervals. Do not take your medicine more often than directed. It is important that you put your used needles and syringes in a special sharps container. Do not put them in a trash can. If you do not have a sharps container, call your pharmacist or healthcare provider to get one. Talk to your pediatrician regarding the use of this medicine in children. While this medicine may be used in children as young as 1 year for selected conditions, precautions do apply. Overdosage: If you think you have taken too much of this medicine contact a poison control center or  emergency room at once. NOTE: This medicine is only for you. Do not share this medicine with others. What if I miss a dose? If you miss a dose, take it as soon as you can. If it is almost time for your next dose, take only that dose. Do not take double or extra doses. What may interact with this medicine? Do not take this medicine with any of the following medications: -epoetin alfa This list may not describe all possible interactions. Give your health care provider a list of all the medicines, herbs, non-prescription drugs, or dietary supplements you use. Also tell them if you smoke, drink alcohol, or use illegal drugs. Some items may interact with your medicine. What should I watch for while using this medicine? Visit your prescriber or health care professional for regular checks on your progress and for the needed blood tests and blood pressure measurements. It is especially important for the doctor to make sure your hemoglobin level is in the desired range, to limit the risk of potential side effects and to give you the best benefit. Keep all appointments for any recommended tests. Check your blood pressure as directed. Ask your doctor what your blood pressure should be and when you should contact him or her. As your body makes more red blood cells, you may need to take iron, folic acid, or vitamin B supplements. Ask your doctor or health care provider which products are right for you. If you have kidney disease continue dietary restrictions, even though this medication can make you feel better. Talk with your doctor or health   care professional about the foods you eat and the vitamins that you take. What side effects may I notice from receiving this medicine? Side effects that you should report to your doctor or health care professional as soon as possible: -allergic reactions like skin rash, itching or hives, swelling of the face, lips, or tongue -breathing problems -changes in vision -chest  pain -confusion, trouble speaking or understanding -feeling faint or lightheaded, falls -high blood pressure -muscle aches or pains -pain, swelling, warmth in the leg -rapid weight gain -severe headaches -sudden numbness or weakness of the face, arm or leg -trouble walking, dizziness, loss of balance or coordination -seizures (convulsions) -swelling of the ankles, feet, hands -unusually weak or tired Side effects that usually do not require medical attention (report to your doctor or health care professional if they continue or are bothersome): -diarrhea -fever, chills (flu-like symptoms) -headaches -nausea, vomiting -redness, stinging, or swelling at site where injected This list may not describe all possible side effects. Call your doctor for medical advice about side effects. You may report side effects to FDA at 1-800-FDA-1088. Where should I keep my medicine? Keep out of the reach of children. Store in a refrigerator between 2 and 8 degrees C (36 and 46 degrees F). Do not freeze. Do not shake. Throw away any unused portion if using a single-dose vial. Throw away any unused medicine after the expiration date. NOTE: This sheet is a summary. It may not cover all possible information. If you have questions about this medicine, talk to your doctor, pharmacist, or health care provider.  2015, [employer]vier/Gold Standard. (2008-05-04 10:23:57)  

## 2014-06-01 ENCOUNTER — Telehealth: Payer: Self-pay | Admitting: Hematology

## 2014-06-01 ENCOUNTER — Telehealth: Payer: Self-pay | Admitting: *Deleted

## 2014-06-01 NOTE — Telephone Encounter (Signed)
Per staff message and POF I have scheduled appts. Advised scheduler of appts. JMW  

## 2014-06-01 NOTE — Telephone Encounter (Signed)
confirm appt for 06/14/14. PT has called confirmed tx added.

## 2014-06-07 ENCOUNTER — Telehealth: Payer: Self-pay

## 2014-06-07 DIAGNOSIS — M79642 Pain in left hand: Secondary | ICD-10-CM

## 2014-06-07 DIAGNOSIS — R200 Anesthesia of skin: Secondary | ICD-10-CM

## 2014-06-07 DIAGNOSIS — T829XXA Unspecified complication of cardiac and vascular prosthetic device, implant and graft, initial encounter: Secondary | ICD-10-CM

## 2014-06-07 NOTE — Telephone Encounter (Signed)
Phone call from pt.  Reported he has had extreme pain in the (L) 1st and 2nd fingers, and hand for sev. weeks.  Reported that it started gradually and has progressively worsened.  Reported he has difficulty gripping with the left hand.  Stated he has intermittent numbness and "pain like a bee sting" in the left hand and fingers.  Has not started dialysis yet.  Denies change in temperature or color of the left hand/ fingers.  Discussed with Dr. Trula Slade.  Advised to bring pt. into office tomorrow for steal study of left arm, and to see a provider.  Notified pt. of appt. 06/08/14 at 1:00 PM.

## 2014-06-08 ENCOUNTER — Ambulatory Visit (INDEPENDENT_AMBULATORY_CARE_PROVIDER_SITE_OTHER): Payer: Medicare Other | Admitting: Vascular Surgery

## 2014-06-08 ENCOUNTER — Encounter: Payer: Self-pay | Admitting: Vascular Surgery

## 2014-06-08 ENCOUNTER — Ambulatory Visit (HOSPITAL_COMMUNITY)
Admission: RE | Admit: 2014-06-08 | Discharge: 2014-06-08 | Disposition: A | Discharge status: Home / Self Care | Payer: Medicare Other | Source: Ambulatory Visit | Attending: Vascular Surgery | Admitting: Vascular Surgery

## 2014-06-08 VITALS — BP 146/54 | HR 60 | Ht 68.0 in | Wt 160.4 lb

## 2014-06-08 DIAGNOSIS — R200 Anesthesia of skin: Secondary | ICD-10-CM

## 2014-06-08 DIAGNOSIS — M79642 Pain in left hand: Secondary | ICD-10-CM | POA: Insufficient documentation

## 2014-06-08 DIAGNOSIS — N186 End stage renal disease: Secondary | ICD-10-CM

## 2014-06-08 DIAGNOSIS — Y841 Kidney dialysis as the cause of abnormal reaction of the patient, or of later complication, without mention of misadventure at the time of the procedure: Secondary | ICD-10-CM | POA: Diagnosis not present

## 2014-06-08 DIAGNOSIS — N184 Chronic kidney disease, stage 4 (severe): Secondary | ICD-10-CM

## 2014-06-08 DIAGNOSIS — T829XXA Unspecified complication of cardiac and vascular prosthetic device, implant and graft, initial encounter: Secondary | ICD-10-CM

## 2014-06-08 DIAGNOSIS — R208 Other disturbances of skin sensation: Secondary | ICD-10-CM | POA: Diagnosis not present

## 2014-06-08 NOTE — Progress Notes (Signed)
Subjective:     Patient ID: [name], male   DOB: 1932-02-16, 81 y.o.   MRN: [id number]  HPI this 81 year old male has stage IV chronic kidney disease and is followed by Dr. Wille Glaser: [name]. He is not yet on hemodialysis. He is right-handed. He had a left radial-cephalic AV fistula created by Dr. Juanda Crumble fields in June 2015. Patient had no pain in his hand for the first 5 months but over the past 6 weeks has noted pain in his left second and third fingers. This is not severe enough to keep him awake at night but does bother him intermittently during the day. He has no history of infection, drainage, or gangrene of the digits. He has no pain in the other digits.  Past Medical History  Diagnosis Date  . Chronic kidney disease   . Thrombocythemia   . Esophageal stricture   . Anemia   . Anemia due to chronic renal failure treated with erythropoietin 03/24/2011  . Retinal vein thrombosis, left   . Coronary artery disease   . Hypertension   . GERD (gastroesophageal reflux disease)   . Arthritis   . Anginal pain   . Erosive esophagitis     grade 4  . Diverticulosis   . Internal hemorrhoids     History  Substance Use Topics  . Smoking status: Former Smoker    Types: Cigarettes    Quit date: 11/27/1978  . Smokeless tobacco: Former Systems developer    Types: Simpson date: 11/27/1978     Comment: quit 30 +yrs ago  . Alcohol Use: No     Comment: Quit 30 + yrs ago    Family History  Problem Relation Age of Onset  . Dementia Mother   . Angina Father   . Heart attack Neg Hx     Allergies  Allergen Reactions  . Isosorbide Other (See Comments)    Caused a blood clot  . Norvasc [Amlodipine Besylate] Other (See Comments)    TIA's    Current outpatient prescriptions: acyclovir (ZOVIRAX) 200 MG capsule, Take 200 mg by mouth Daily. , Disp: , Rfl: ;  ALPRAZolam (XANAX) 0.25 MG tablet, Take 0.25 mg by mouth 3 (three) times daily as needed for anxiety. , Disp: , Rfl: ;  anagrelide (AGRYLIN) 1  MG capsule, TAKE ONE CAPSULE BY MOUTH ONCE DAILY OR AS DIRECTED BY PHYSICIAN., Disp: 60 capsule, Rfl: 0;  aspirin 81 MG EC tablet, Take 81 mg by mouth daily. Swallow whole., Disp: , Rfl:  carboxymethylcellulose (REFRESH PLUS) 0.5 % SOLN, Place 1 drop into both eyes 3 (three) times daily as needed (dry eyes)., Disp: , Rfl: ;  cloNIDine (CATAPRES) 0.3 MG tablet, Take 0.3 mg by mouth 3 (three) times daily.  , Disp: , Rfl: ;  fluticasone (FLONASE) 50 MCG/ACT nasal spray, Place 1 spray into both nostrils daily as needed for allergies. As directed, Disp: , Rfl: ;  furosemide (LASIX) 40 MG tablet, Take 60 mg by mouth daily. , Disp: , Rfl:  hydrALAZINE (APRESOLINE) 25 MG tablet, take 1 tablet by mouth three times a day, Disp: 90 tablet, Rfl: 10;  ketoconazole (NIZORAL) 2 % cream, Apply 1 application topically daily as needed for irritation. , Disp: , Rfl: ;  metoprolol succinate (TOPROL-XL) 100 MG 24 hr tablet, take 1 tablet by mouth twice a day with OR IMMEDIATELY AFTER A MEAL, Disp: 60 tablet, Rfl: 10 oxyCODONE (ROXICODONE) 5 MG immediate release tablet, Take 1 tablet (5 mg total)  by mouth every 6 (six) hours as needed for severe pain., Disp: 10 tablet, Rfl: 0;  pantoprazole (PROTONIX) 40 MG tablet, Take 1 tablet (40 mg total) by mouth daily., Disp: 30 tablet, Rfl: 3;  PROVENTIL HFA 108 (90 BASE) MCG/ACT inhaler, Inhale 2 puffs into the lungs every 4 (four) hours as needed. Wheezing and/or shortness of breath, Disp: , Rfl:  psyllium (METAMUCIL) 58.6 % powder, Take 1 packet by mouth daily as needed (constipation). , Disp: , Rfl: ;  Tamsulosin HCl (FLOMAX) 0.4 MG CAPS, Take 0.4 mg by mouth daily after supper. , Disp: , Rfl: ;  triamcinolone cream (KENALOG) 0.1 %, Apply 1 application topically daily as needed (itching). , Disp: , Rfl: ;  zolpidem (AMBIEN) 5 MG tablet, Take 5 mg by mouth at bedtime as needed for sleep. , Disp: , Rfl:   BP 146/54 mmHg  Pulse 60  Ht 5\' 8"  (1.727 m)  Wt 160 lb 6.4 oz (72.757 kg)  BMI  24.39 kg/m2  SpO2 96%  Body mass index is 24.39 kg/(m^2).           Review of Systems denies chest pain, dyspnea on exertion, PND, orthopnea, hemoptysis     Objective:   Physical Exam BP 146/54 mmHg  Pulse 60  Ht 5\' 8"  (1.727 m)  Wt 160 lb 6.4 oz (72.757 kg)  BMI 24.39 kg/m2  SpO2 96%  Gen. elderly male no apparent stress alert and oriented 3 Lungs no rhonchi or wheezing Left every extremity with good pulse and palpable thrill and radial-cephalic AV fistula. Vein is quite visible through the skin with multiple branches. No evidence of ischemia, ulceration, or gangrene involving the left hand or digits. Digits are not tender to palpation.  Right upper extremity with 2+ brachial and radial pulse palpable and cephalic vein appears satisfactory for fistula creation.  Today I ordered a vascular lab study with segmental waveforms to check for steal syndrome. Patient has flat waveforms in the left second and third digits which improved significantly with compression of the fistula.     Assessment:     Left radial cephalic AV fistula created June 2015 by Dr. Oneida Alar. Patient developed steal syndrome 6 weeks ago involving second and third digits I have recommended ligation of the fistula with creation of contralateral right radial-cephalic AV fistula. He does not want to proceed with that now. He will monitor his pain and look for evidence of ischemia or darkening of skin and get in contact with [place] if that occurs.    Plan:     Follow-up with Dr. Juanda Crumble fields in 4 weeks unless patient decides at an earlier date to proceed with ligation of left arm fistula and creation of right arm fistula.

## 2014-06-14 ENCOUNTER — Other Ambulatory Visit (HOSPITAL_BASED_OUTPATIENT_CLINIC_OR_DEPARTMENT_OTHER): Payer: Medicare Other

## 2014-06-14 ENCOUNTER — Ambulatory Visit (HOSPITAL_BASED_OUTPATIENT_CLINIC_OR_DEPARTMENT_OTHER): Payer: Medicare Other

## 2014-06-14 ENCOUNTER — Ambulatory Visit: Payer: Medicare Other

## 2014-06-14 DIAGNOSIS — D649 Anemia, unspecified: Secondary | ICD-10-CM

## 2014-06-14 DIAGNOSIS — N189 Chronic kidney disease, unspecified: Secondary | ICD-10-CM

## 2014-06-14 DIAGNOSIS — D509 Iron deficiency anemia, unspecified: Secondary | ICD-10-CM

## 2014-06-14 DIAGNOSIS — D631 Anemia in chronic kidney disease: Secondary | ICD-10-CM

## 2014-06-14 DIAGNOSIS — N183 Chronic kidney disease, stage 3 unspecified: Secondary | ICD-10-CM

## 2014-06-14 LAB — CBC WITH DIFFERENTIAL/PLATELET
BASO%: 0.8 % (ref 0.0–2.0)
Basophils Absolute: 0 10*3/uL (ref 0.0–0.1)
EOS ABS: 0.1 10*3/uL (ref 0.0–0.5)
EOS%: 3.3 % (ref 0.0–7.0)
HCT: 26.2 % — ABNORMAL LOW (ref 38.4–49.9)
HEMOGLOBIN: 7.8 g/dL — AB (ref 13.0–17.1)
LYMPH%: 15.8 % (ref 14.0–49.0)
MCH: 26.3 pg — AB (ref 27.2–33.4)
MCHC: 29.7 g/dL — ABNORMAL LOW (ref 32.0–36.0)
MCV: 88.4 fL (ref 79.3–98.0)
MONO#: 0.5 10*3/uL (ref 0.1–0.9)
MONO%: 11.7 % (ref 0.0–14.0)
NEUT%: 68.4 % (ref 39.0–75.0)
NEUTROS ABS: 3 10*3/uL (ref 1.5–6.5)
PLATELETS: 288 10*3/uL (ref 140–400)
RBC: 2.96 10*6/uL — AB (ref 4.20–5.82)
RDW: 18.3 % — AB (ref 11.0–14.6)
WBC: 4.4 10*3/uL (ref 4.0–10.3)
lymph#: 0.7 10*3/uL — ABNORMAL LOW (ref 0.9–3.3)

## 2014-06-14 MED ORDER — SODIUM CHLORIDE 0.9 % IJ SOLN
10.0000 mL | INTRAMUSCULAR | Status: DC | PRN
  Filled 2014-06-14: qty 10

## 2014-06-14 MED ORDER — DIPHENHYDRAMINE HCL 25 MG PO CAPS
25.0000 mg | ORAL_CAPSULE | Freq: Once | ORAL | Status: AC
  Administered 2014-06-14: 25 mg via ORAL

## 2014-06-14 MED ORDER — HEPARIN SOD (PORK) LOCK FLUSH 100 UNIT/ML IV SOLN
500.0000 [IU] | Freq: Once | INTRAVENOUS | Status: DC | PRN
  Filled 2014-06-14: qty 5

## 2014-06-14 MED ORDER — HEPARIN SOD (PORK) LOCK FLUSH 100 UNIT/ML IV SOLN
250.0000 [IU] | Freq: Once | INTRAVENOUS | Status: DC | PRN
  Filled 2014-06-14: qty 5

## 2014-06-14 MED ORDER — METHYLPREDNISOLONE SODIUM SUCC 40 MG IJ SOLR
INTRAMUSCULAR | Status: AC
Start: 2014-06-14 — End: 2014-06-14
  Filled 2014-06-14: qty 1

## 2014-06-14 MED ORDER — METHYLPREDNISOLONE SODIUM SUCC 40 MG IJ SOLR
20.0000 mg | Freq: Once | INTRAMUSCULAR | Status: AC
  Administered 2014-06-14: 17:00:00 via INTRAVENOUS

## 2014-06-14 MED ORDER — SODIUM CHLORIDE 0.9 % IV SOLN
Freq: Once | INTRAVENOUS | Status: AC
  Administered 2014-06-14: 16:00:00 via INTRAVENOUS

## 2014-06-14 MED ORDER — SODIUM CHLORIDE 0.9 % IJ SOLN
3.0000 mL | Freq: Once | INTRAMUSCULAR | Status: DC | PRN
  Filled 2014-06-14: qty 10

## 2014-06-14 MED ORDER — DIPHENHYDRAMINE HCL 25 MG PO CAPS
ORAL_CAPSULE | ORAL | Status: AC
  Filled 2014-06-14: qty 1

## 2014-06-14 MED ORDER — ALTEPLASE 2 MG IJ SOLR
2.0000 mg | Freq: Once | INTRAMUSCULAR | Status: DC | PRN
  Filled 2014-06-14: qty 2

## 2014-06-14 MED ORDER — DARBEPOETIN ALFA 300 MCG/0.6ML IJ SOSY
300.0000 ug | PREFILLED_SYRINGE | Freq: Once | INTRAMUSCULAR | Status: AC
  Administered 2014-06-14: 300 ug via SUBCUTANEOUS
  Filled 2014-06-14: qty 0.6

## 2014-06-14 MED ORDER — SODIUM CHLORIDE 0.9 % IV SOLN
510.0000 mg | Freq: Once | INTRAVENOUS | Status: AC
  Administered 2014-06-14: 510 mg via INTRAVENOUS
  Filled 2014-06-14: qty 17

## 2014-06-14 NOTE — Patient Instructions (Signed)

## 2014-06-21 ENCOUNTER — Telehealth: Payer: Self-pay

## 2014-06-21 NOTE — Telephone Encounter (Signed)
Phone call from pt. and reported that he has increased stinging and burning in the 1st, and 2nd fingers, and knuckles of the left hand. Also reports he has trouble gripping with the left hand.  Is requesting to have a "low dose" pain medication ordered.  Advised pt. will need to move his appt. to an earlier date to reevaluate symptoms, instead of giving pain medication; advised will notify him with new appt.; encouraged to call office sooner if his symptoms worsen before the new appt. time.  Verb. understanding.

## 2014-06-21 NOTE — Telephone Encounter (Signed)
Spoke with pt- dpm

## 2014-06-25 ENCOUNTER — Encounter: Payer: Self-pay | Admitting: *Deleted

## 2014-06-28 ENCOUNTER — Ambulatory Visit: Payer: Medicare Other | Admitting: Hematology and Oncology

## 2014-06-28 ENCOUNTER — Ambulatory Visit: Payer: Medicare Other

## 2014-06-28 ENCOUNTER — Ambulatory Visit: Payer: Medicare Other | Admitting: Hematology

## 2014-06-28 ENCOUNTER — Telehealth: Payer: Self-pay | Admitting: *Deleted

## 2014-06-28 ENCOUNTER — Other Ambulatory Visit: Payer: Self-pay | Admitting: *Deleted

## 2014-06-28 ENCOUNTER — Other Ambulatory Visit: Payer: Medicare Other

## 2014-06-28 NOTE — Telephone Encounter (Signed)
Per staff message and POF I have scheduled appts. Advised scheduler of appts. JMW  

## 2014-06-28 NOTE — Telephone Encounter (Signed)
Received vm call from pt's wife stating that they are unable to make appt today b/c they can't get here & would like to r/s.  POF to scheduler & wife notified that scheduler should call her.

## 2014-06-29 ENCOUNTER — Telehealth: Payer: Self-pay | Admitting: Hematology

## 2014-06-29 NOTE — Telephone Encounter (Signed)
S/w pt confirmed labs/ov/IV Iron/inj per 01/25 POF, approved by MD we moved pt up earlier so that he can get it all done in one day. Sent msg to add IV Iron, added and confirmed with pt and his wife..... KJ

## 2014-06-30 ENCOUNTER — Encounter: Payer: Self-pay | Admitting: Vascular Surgery

## 2014-07-01 ENCOUNTER — Encounter: Payer: Self-pay | Admitting: Vascular Surgery

## 2014-07-01 ENCOUNTER — Other Ambulatory Visit: Payer: Medicare Other

## 2014-07-01 ENCOUNTER — Ambulatory Visit: Payer: Medicare Other | Admitting: Hematology

## 2014-07-01 ENCOUNTER — Other Ambulatory Visit: Payer: Self-pay

## 2014-07-01 ENCOUNTER — Ambulatory Visit (INDEPENDENT_AMBULATORY_CARE_PROVIDER_SITE_OTHER): Payer: Medicare Other | Admitting: Vascular Surgery

## 2014-07-01 VITALS — BP 134/53 | HR 54 | Ht 68.0 in | Wt 161.5 lb

## 2014-07-01 DIAGNOSIS — T82898D Other specified complication of vascular prosthetic devices, implants and grafts, subsequent encounter: Secondary | ICD-10-CM

## 2014-07-01 NOTE — Progress Notes (Signed)
Patient is an 81 year old male who is status post placement of a left radiocephalic AV fistula June 0254. He did well with this until approximate 6 weeks ago he began to experience numbness and tingling in the first and second digit of the left hand. He is currently not on dialysis. He was seen by my partner Dr. Kellie [name] several weeks ago and offered ligation of the fistula and placement of a new fistula. The patient returns today for further follow-up. At this point he is requesting removal of the left arm AV fistula.  Past Medical History  Diagnosis Date  . Chronic kidney disease   . Thrombocythemia   . Esophageal stricture   . Anemia   . Anemia due to chronic renal failure treated with erythropoietin 03/24/2011  . Retinal vein thrombosis, left   . Coronary artery disease   . Hypertension   . GERD (gastroesophageal reflux disease)   . Arthritis   . Anginal pain   . Erosive esophagitis     grade 4  . Diverticulosis   . Internal hemorrhoids     Past Surgical History  Procedure Laterality Date  . Replacement total knee Right 04/19/08  . Av fistula placement Left 12/01/2013    Procedure: RADIOCEPHALIC ARTERIOVENOUS (AV) FISTULA CREATION;  Surgeon: Elam Dutch, MD;  Location: [facility];  Service: Vascular;  Laterality: Left;  . Coronary artery bypass graft  2008    x 5  . Eye surgery Left yrs ago    vitrectomy  . Eye brow lift Right     01-11-2014  . Inguinal hernia repair Left 05/08/01  . Colonoscopy N/A 02/02/2014    Procedure: COLONOSCOPY;  Surgeon: Inda Castle, MD;  Location: [facility];  Service: Endoscopy;  Laterality: N/A;  . Esophagogastroduodenoscopy N/A 02/02/2014    Procedure: ESOPHAGOGASTRODUODENOSCOPY (EGD);  Surgeon: Inda Castle, MD;  Location: [facility];  Service: Endoscopy;  Laterality: N/A;   Current Outpatient Prescriptions on File Prior to Visit  Medication Sig Dispense Refill  . acyclovir (ZOVIRAX) 200 MG capsule Take 200 mg by mouth Daily.     Marland Kitchen  ALPRAZolam (XANAX) 0.25 MG tablet Take 0.25 mg by mouth 3 (three) times daily as needed for anxiety.     Marland Kitchen anagrelide (AGRYLIN) 1 MG capsule TAKE ONE CAPSULE BY MOUTH ONCE DAILY OR AS DIRECTED BY PHYSICIAN. 60 capsule 0  . aspirin 81 MG EC tablet Take 81 mg by mouth daily. Swallow whole.    . carboxymethylcellulose (REFRESH PLUS) 0.5 % SOLN Place 1 drop into both eyes 3 (three) times daily as needed (dry eyes).    . cloNIDine (CATAPRES) 0.3 MG tablet Take 0.3 mg by mouth 3 (three) times daily.      . fluticasone (FLONASE) 50 MCG/ACT nasal spray Place 1 spray into both nostrils daily as needed for allergies. As directed    . furosemide (LASIX) 40 MG tablet Take 60 mg by mouth daily.     . hydrALAZINE (APRESOLINE) 25 MG tablet take 1 tablet by mouth three times a day 90 tablet 10  . ketoconazole (NIZORAL) 2 % cream Apply 1 application topically daily as needed for irritation.     . metoprolol succinate (TOPROL-XL) 100 MG 24 hr tablet take 1 tablet by mouth twice a day with OR IMMEDIATELY AFTER A MEAL 60 tablet 10  . oxyCODONE (ROXICODONE) 5 MG immediate release tablet Take 1 tablet (5 mg total) by mouth every 6 (six) hours as needed for severe pain. 10 tablet 0  .  pantoprazole (PROTONIX) 40 MG tablet Take 1 tablet (40 mg total) by mouth daily. 30 tablet 3  . PROVENTIL HFA 108 (90 BASE) MCG/ACT inhaler Inhale 2 puffs into the lungs every 4 (four) hours as needed. Wheezing and/or shortness of breath    . psyllium (METAMUCIL) 58.6 % powder Take 1 packet by mouth daily as needed (constipation).     . Tamsulosin HCl (FLOMAX) 0.4 MG CAPS Take 0.4 mg by mouth daily after supper.     . triamcinolone cream (KENALOG) 0.1 % Apply 1 application topically daily as needed (itching).     . zolpidem (AMBIEN) 5 MG tablet Take 5 mg by mouth at bedtime as needed for sleep.      No current facility-administered medications on file prior to visit.   Allergies  Allergen Reactions  . Isosorbide Other (See Comments)     Caused a blood clot  . Norvasc [Amlodipine Besylate] Other (See Comments)    TIA's    Review of systems: He denies chest pain. He denies shortness of breath.  Physical exam:  Filed Vitals:   07/01/14 0848  BP: 134/53  Pulse: 54  Height: 5\' 8"  (1.727 m)  Weight: 161 lb 8 oz (73.256 kg)  SpO2: 99%    Extremities: 2+ radial pulse bilaterally palpable thrill audible bruit in fistula, well-healed radiocephalic incision  Data: A steel scan dated 06/08/2014 was reviewed. The patient had full restoration of flow to his left hand with compression of the fistula. This is consistent with ischemic steal.  Assessment: Steal left hand patient currently not on dialysis. Patient wishes to have the fistula removed. I discussed with the patient today ligation of the fistula and told him that hopefully will get improvement of symptoms but he could have some persistent numbness after the procedure. I also discussed with the patient placing a new fistula in the right upper extremity. However he prefers to defer working on the right arm until the left arm has healed significantly.  Plan: Ligation left arm AV fistula 07/06/2014. Risks benefits possible compilations of procedure details including not limited to bleeding infection persistent numbness and tingling in the hand. Patient understands and agrees to proceed.  Ruta Hinds, MD [facility] Office: [phone] Pager: [phone]

## 2014-07-02 ENCOUNTER — Ambulatory Visit (HOSPITAL_BASED_OUTPATIENT_CLINIC_OR_DEPARTMENT_OTHER): Payer: Medicare Other

## 2014-07-02 ENCOUNTER — Ambulatory Visit (HOSPITAL_BASED_OUTPATIENT_CLINIC_OR_DEPARTMENT_OTHER): Payer: Medicare Other | Admitting: Hematology

## 2014-07-02 ENCOUNTER — Other Ambulatory Visit (HOSPITAL_BASED_OUTPATIENT_CLINIC_OR_DEPARTMENT_OTHER): Payer: Medicare Other

## 2014-07-02 ENCOUNTER — Other Ambulatory Visit: Payer: Self-pay | Admitting: *Deleted

## 2014-07-02 ENCOUNTER — Ambulatory Visit: Payer: Medicare Other

## 2014-07-02 ENCOUNTER — Telehealth: Payer: Self-pay | Admitting: Hematology

## 2014-07-02 VITALS — BP 134/53 | HR 63 | Temp 97.7°F | Resp 18 | Ht 68.0 in | Wt 157.0 lb

## 2014-07-02 DIAGNOSIS — D473 Essential (hemorrhagic) thrombocythemia: Secondary | ICD-10-CM

## 2014-07-02 DIAGNOSIS — R259 Unspecified abnormal involuntary movements: Secondary | ICD-10-CM

## 2014-07-02 DIAGNOSIS — D649 Anemia, unspecified: Secondary | ICD-10-CM

## 2014-07-02 DIAGNOSIS — N189 Chronic kidney disease, unspecified: Secondary | ICD-10-CM

## 2014-07-02 DIAGNOSIS — D631 Anemia in chronic kidney disease: Secondary | ICD-10-CM

## 2014-07-02 DIAGNOSIS — N183 Chronic kidney disease, stage 3 unspecified: Secondary | ICD-10-CM

## 2014-07-02 DIAGNOSIS — I10 Essential (primary) hypertension: Secondary | ICD-10-CM

## 2014-07-02 DIAGNOSIS — K222 Esophageal obstruction: Secondary | ICD-10-CM

## 2014-07-02 DIAGNOSIS — N186 End stage renal disease: Secondary | ICD-10-CM

## 2014-07-02 DIAGNOSIS — R269 Unspecified abnormalities of gait and mobility: Secondary | ICD-10-CM

## 2014-07-02 DIAGNOSIS — D509 Iron deficiency anemia, unspecified: Secondary | ICD-10-CM

## 2014-07-02 DIAGNOSIS — R0609 Other forms of dyspnea: Principal | ICD-10-CM

## 2014-07-02 DIAGNOSIS — K2210 Ulcer of esophagus without bleeding: Secondary | ICD-10-CM

## 2014-07-02 DIAGNOSIS — R011 Cardiac murmur, unspecified: Secondary | ICD-10-CM

## 2014-07-02 DIAGNOSIS — R5383 Other fatigue: Secondary | ICD-10-CM

## 2014-07-02 DIAGNOSIS — R195 Other fecal abnormalities: Secondary | ICD-10-CM

## 2014-07-02 DIAGNOSIS — I255 Ischemic cardiomyopathy: Secondary | ICD-10-CM

## 2014-07-02 DIAGNOSIS — IMO0002 Reserved for concepts with insufficient information to code with codable children: Secondary | ICD-10-CM

## 2014-07-02 DIAGNOSIS — H348122 Central retinal vein occlusion, left eye, stable: Secondary | ICD-10-CM

## 2014-07-02 LAB — CBC WITH DIFFERENTIAL/PLATELET
BASO%: 1.5 % (ref 0.0–2.0)
BASOS ABS: 0 10*3/uL (ref 0.0–0.1)
EOS%: 4.1 % (ref 0.0–7.0)
Eosinophils Absolute: 0.1 10*3/uL (ref 0.0–0.5)
HEMATOCRIT: 25.8 % — AB (ref 38.4–49.9)
HGB: 7.9 g/dL — ABNORMAL LOW (ref 13.0–17.1)
LYMPH%: 15.2 % (ref 14.0–49.0)
MCH: 27.9 pg (ref 27.2–33.4)
MCHC: 30.6 g/dL — AB (ref 32.0–36.0)
MCV: 91.5 fL (ref 79.3–98.0)
MONO#: 0.3 10*3/uL (ref 0.1–0.9)
MONO%: 8.5 % (ref 0.0–14.0)
NEUT%: 70.7 % (ref 39.0–75.0)
NEUTROS ABS: 2.3 10*3/uL (ref 1.5–6.5)
PLATELETS: 218 10*3/uL (ref 140–400)
RBC: 2.82 10*6/uL — ABNORMAL LOW (ref 4.20–5.82)
RDW: 19.8 % — ABNORMAL HIGH (ref 11.0–14.6)
WBC: 3.2 10*3/uL — ABNORMAL LOW (ref 4.0–10.3)
lymph#: 0.5 10*3/uL — ABNORMAL LOW (ref 0.9–3.3)

## 2014-07-02 MED ORDER — SODIUM CHLORIDE 0.9 % IJ SOLN
10.0000 mL | INTRAMUSCULAR | Status: DC | PRN
  Filled 2014-07-02: qty 10

## 2014-07-02 MED ORDER — SODIUM CHLORIDE 0.9 % IV SOLN
Freq: Once | INTRAVENOUS | Status: AC
  Administered 2014-07-02: 12:00:00 via INTRAVENOUS

## 2014-07-02 MED ORDER — DARBEPOETIN ALFA 300 MCG/0.6ML IJ SOSY
300.0000 ug | PREFILLED_SYRINGE | Freq: Once | INTRAMUSCULAR | Status: AC
  Administered 2014-07-02: 300 ug via SUBCUTANEOUS
  Filled 2014-07-02: qty 0.6

## 2014-07-02 MED ORDER — SODIUM CHLORIDE 0.9 % IV SOLN
510.0000 mg | Freq: Once | INTRAVENOUS | Status: AC
  Administered 2014-07-02: 510 mg via INTRAVENOUS
  Filled 2014-07-02: qty 17

## 2014-07-02 NOTE — Progress Notes (Signed)
[facility] OFFICE PROGRESS NOTE DATE OF VISIT: 04/05/2014  [name], MD [place] [address]  DIAGNOSIS: Anemia due to chronic renal failure treated with erythropoietin, unspecified stage  Essential thrombocythemia  Chief Complaint  Patient presents with  . Follow-up  . Anemia    CURRENT AND PREVIOUS THERAPY:  1. Aspirin 81 mg by mouth daily.  2. Anagrelide (Agrylin) 1 mg by mouth daily, stopped on 07/03/2014.  3. Aranesp 300 mcg subcu every 2 weeks for hemoglobin less than 11.   INTERVAL HISTORY:  [name] 81 y.o. male history of essential thrombocythemia which was diagnosed in 09/1993 and anemia due to chronic renal failure treated with erythropoietin is here for 2 month follow-up.   He has been getting her next every 2 weeks and received 1 dose of ferriheme 2 weeks ago, he still has moderate fatigue, but able to function well. He comes in himself today. No other new complaints. He denies any chest pain, headaches, any bleeding episodes. He is scheduled to have a vascular surgery for his left fistula revision next week.   MEDICAL HISTORY: Past Medical History  Diagnosis Date  . Chronic kidney disease   . Thrombocythemia   . Esophageal stricture   . Anemia   . Anemia due to chronic renal failure treated with erythropoietin 03/24/2011  . Retinal vein thrombosis, left   . Coronary artery disease   . Hypertension   . GERD (gastroesophageal reflux disease)   . Arthritis   . Anginal pain   . Erosive esophagitis     grade 4  . Diverticulosis   . Internal hemorrhoids     ALLERGIES:  is allergic to isosorbide and norvasc.  MEDICATIONS: has a current medication list which includes the following prescription(s): acyclovir, alprazolam, anagrelide, aspirin, carboxymethylcellulose, clonidine, fluticasone, furosemide, hydralazine, ketoconazole, metoprolol succinate, oxycodone, pantoprazole, proventil hfa, psyllium, tamsulosin,  triamcinolone cream, and zolpidem.  SURGICAL HISTORY:  Past Surgical History  Procedure Laterality Date  . Replacement total knee Right 04/19/08  . Av fistula placement Left 12/01/2013    Procedure: RADIOCEPHALIC ARTERIOVENOUS (AV) FISTULA CREATION;  Surgeon: Elam Dutch, MD;  Location: [facility];  Service: Vascular;  Laterality: Left;  . Coronary artery bypass graft  2008    x 5  . Eye surgery Left yrs ago    vitrectomy  . Eye brow lift Right     01-11-2014  . Inguinal hernia repair Left 05/08/01  . Colonoscopy N/A 02/02/2014    Procedure: COLONOSCOPY;  Surgeon: Inda Castle, MD;  Location: [facility];  Service: Endoscopy;  Laterality: N/A;  . Esophagogastroduodenoscopy N/A 02/02/2014    Procedure: ESOPHAGOGASTRODUODENOSCOPY (EGD);  Surgeon: Inda Castle, MD;  Location: [facility];  Service: Endoscopy;  Laterality: N/A;    REVIEW OF SYSTEMS:   Constitutional: Denies fevers, chills or abnormal weight loss, (+) fatigue  Eyes: Denies blurriness of vision Ears, nose, mouth, throat, and face: Denies mucositis or sore throat Respiratory: Denies cough, dyspnea or wheezes Cardiovascular: Denies palpitation, chest discomfort or lower extremity swelling Gastrointestinal:  Denies nausea, heartburn or change in bowel habits Skin: Denies abnormal skin rashes Lymphatics: Denies new lymphadenopathy or easy bruising Neurological:Denies numbness, tingling or new weaknesses. Balance has improved a little Behavioral/Psych: Mood is stable, no new changes  All other systems were reviewed with the patient and are negative.  PHYSICAL EXAMINATION: ECOG PERFORMANCE STATUS: 1  Blood pressure 134/53, pulse 63, temperature 97.7 F (36.5 C), temperature source Oral, resp. rate  18, height $RemoveBe'5\' 8"'CQYFNhWqf$  (1.727 m), weight 157 lb (71.215 kg), SpO2 94 %.  GENERAL:alert, no distress and comfortable, elderly male; moves limbs spontaneously through out exams SKIN: skin color, texture, turgor are normal, no  rashes EYES: normal, Conjunctiva are pink and non-injected, sclera clear OROPHARYNX:no exudate, no erythema and lips, buccal mucosa, and tongue normal  NECK: supple, thyroid normal size, non-tender, without nodularity LYMPH:  no palpable lymphadenopathy in the cervical, axillary or inguinal LUNGS: clear to auscultation and percussion with normal breathing effort HEART: regular rate & rhythm and no murmurs and no lower extremity edema ABDOMEN:abdomen soft, non-tender and normal bowel sounds Musculoskeletal:no peripheral edema NEURO: alert & oriented x 3 with fluent speech, no focal motor/sensory deficits; shuffling gait noted.    LABORATORY DATA: CBC Latest Ref Rng 07/02/2014 06/14/2014 05/31/2014  WBC 4.0 - 10.3 10e3/uL 3.2(L) 4.4 3.5(L)  Hemoglobin 13.0 - 17.1 g/dL 7.9(L) 7.8(L) 8.5(L)  Hematocrit 38.4 - 49.9 % 25.8(L) 26.2(L) 27.9(L)  Platelets 140 - 400 10e3/uL 218 288 243    CMP Latest Ref Rng 03/08/2014 01/11/2014 12/01/2013  Glucose 70 - 140 mg/dl 112 97 101(H)  BUN 7.0 - 26.0 mg/dL 79.0(H) 93(H) -  Creatinine 0.7 - 1.3 mg/dL 2.5(H) 2.86(H) -  Sodium 136 - 145 mEq/L 133(L) 139 134(L)  Potassium 3.5 - 5.1 mEq/L 4.3 4.9 4.7  Chloride 96 - 112 mEq/L - 102 -  CO2 22 - 29 mEq/L 24 22 -  Calcium 8.4 - 10.4 mg/dL 8.6 8.7 -  Total Protein 6.4 - 8.3 g/dL 5.6(L) - -  Albumin 3.5 - 4.8 g/dL - - -  Total Bilirubin 0.20 - 1.20 mg/dL 0.31 - -  Alkaline Phos 40 - 150 U/L 77 - -  AST 5 - 34 U/L 13 - -  ALT 0 - 55 U/L 14 - -      RADIOGRAPHIC STUDIES: No results found.  ASSESSMENT/PLAN:   1. Essential Thrombocythemia (09/1993).  Jak2 V617F, exon 12 and 13 mutation (-)  --His platelet count has been normal in the 200 range -Due to his worsening anemia lately, I'm concerned about his ET may have evolved to myelofibrosis or leukemia. I discussed a bone marrow biopsy, but he declines at this point due to his pending ask her surgery. -I asked him to stop anagrelide, and we'll watch his CBC  closely.  2. Anemia secondary to chronic renal disease and iron deficiency and iron deficieny --His hemoglobin today is 7.8, will transfuse next week. He will receive Aranesp today 300 mcg subcu today. We will continue to check CBCs every 2 weeks and we will give him Aranesp 300 mcg subcu whenever the hemoglobin is less than 11. Erythropoietin level is 33.9. He is asymptomatic at his current level of anemia. We can consider escalating the dose. If need. -history anemia seems not responding to Aranesp well, and slightly worse lately. Bone marrow biopsy was discussed with patient before and he declined. I discussed it with him again today, mainly to ruled out primary marrow disease such as MDS. However, he declines again due to his advanced age and concern of side effects associated with procedure.  -His ferritin was 22 in 06/2014, with the low iron saturation at 17%, he will receive her second dose IV Feraheme $RemoveBef'510mg'ZvyXUXkeHp$  today. -We'll follow up with his iron study, and repeat IV Feraheme if ferritin less than 100 or low saturation  3. Shuffling gait, episodic movements. --He will follow up with his neurology. He is on sinemet. He was again  counseled on fall precautions.   4. CKD -Stable, follow-up with nephrology. -He has a AV fistula in his left forearm, is scheduled to have some revision next week due to his hand numbness.  All questions were answered. The patient knows to call the clinic with any problems, questions or concerns. We can certainly see the patient much sooner if necessary.  I spent 20 minutes counseling the patient face to face. The total time spent in the appointment was 25 minutes.    Plan: -2nd dose feraheme and aranesp today  -Blood transfusion early next week -stop Agrelide, repeat CBC after blood transfusion next week  -RTC in 2 weeks with lab    Truitt Merle 07/03/2014

## 2014-07-02 NOTE — Telephone Encounter (Signed)
gv adn printed appt sched and avs for pt for Feb...sed added tx...no time avaiable on 2.4 per MW

## 2014-07-02 NOTE — Patient Instructions (Signed)

## 2014-07-03 ENCOUNTER — Encounter: Payer: Self-pay | Admitting: Hematology

## 2014-07-05 ENCOUNTER — Encounter (HOSPITAL_COMMUNITY): Payer: Self-pay | Admitting: *Deleted

## 2014-07-05 ENCOUNTER — Encounter (HOSPITAL_COMMUNITY): Payer: Self-pay | Admitting: Pharmacy Technician

## 2014-07-05 ENCOUNTER — Ambulatory Visit (HOSPITAL_COMMUNITY)
Admission: RE | Admit: 2014-07-05 | Discharge: 2014-07-05 | Disposition: A | Discharge status: Home / Self Care | Payer: Medicare Other | Source: Ambulatory Visit | Attending: Internal Medicine | Admitting: Internal Medicine

## 2014-07-05 DIAGNOSIS — N189 Chronic kidney disease, unspecified: Secondary | ICD-10-CM | POA: Insufficient documentation

## 2014-07-05 DIAGNOSIS — D631 Anemia in chronic kidney disease: Secondary | ICD-10-CM | POA: Insufficient documentation

## 2014-07-05 MED ORDER — CEFUROXIME SODIUM 1.5 G IJ SOLR
1.5000 g | INTRAMUSCULAR | Status: AC
  Administered 2014-07-06: 1.5 g via INTRAVENOUS
  Filled 2014-07-05: qty 1.5

## 2014-07-05 MED ORDER — CHLORHEXIDINE GLUCONATE CLOTH 2 % EX PADS: 6.0000 | MEDICATED_PAD | Freq: Once | CUTANEOUS | Status: DC

## 2014-07-05 MED ORDER — SODIUM CHLORIDE 0.9 % IV SOLN
INTRAVENOUS | Status: DC
  Administered 2014-07-06: 25 mL/h via INTRAVENOUS

## 2014-07-05 NOTE — Progress Notes (Signed)
Anesthesia Chart Review:  SAME DAY WORK-UP.   Patient is a 81 year old male scheduled for legation of LUE AVF tomorrow by Dr. Oneida Alar for Steal Syndrome. AVF was created 12/01/13, but he has not required HD up to this point.  Procedure is posted as MAC.  History includes CKD, HTN, CAD s/p CABG (LIMA-LAD, seq SVG-OM1 and OM2, SVG-RCA, SVG-D) '06, brief post-CABG aflutter, ischemic CM, anemia, GERD, erosive esophagitis, essential thrombocytopenia, right TKA '09, former smoker, left retinal thrombosis. By notes, he has a CNS degenerative disease that has not been fully characterized (multisystem atrophy vs spinocerebellar ataxia).Endoscopy 02/2014 with MAC anesthesia.  PCP is Dr. Ardeth Perfect. Nephrologist is Dr. Marval Regal.   Hematologist Dr. Burr Medico, last visit 07/02/14 with these recommendations:  Plan: -2nd dose feraheme and aranesp today  -Blood transfusion early next week -stop Agrelide, repeat CBC after blood transfusion next week  -RTC in 2 weeks with lab. Patient has an out-patient appointment at [facility] today. Dr. Burr Medico was aware of plans for surgery.  Cardiologist is Dr. Aundra Dubin whom he saw on 01/20/14 with additional of Imdur for ischemic cardiomyopathy. Patient stopped this due to headaches, fall, and new SOB and was seen in follow-up on 03/03/14 with Ermalinda Barrios, PA-C.  He had worsening DOE but no chest pain, so she recommended a [employer], but he refused further cardiac work-up after she refused to feel his oxycodone prescription.   03/03/14 EKG: SR with first degree AVB, LAFB, anterior infarct (age undetermined).  07/09/11 Echo: - Left ventricle: The cavity size was normal. Wall thickness was increased in a pattern of mild LVH. Systolic function was mildly reduced. The estimated ejection fraction was in the range of 45% to 50%. Diffuse hypokinesis. - Left atrium: The atrium was moderately dilated. - Right ventricle: The cavity size was mildly dilated. - Right  atrium: The atrium was moderately dilated. - Tricuspid valve: Moderate regurgitation. - Pulmonary arteries: Systolic pressure was moderately increased. PA peak pressure: 32mm Hg (S).  02/25/08 Nuclear stress test: Cardiac enlargement with normal EF but with inferior septal hypokinesia, no significant ischemia. EF 52%.  Labs on 07/02/14 show overall stable H/H of 7.9/25.8.  PLT 218. He is scheduled for ISTAT4 on arrival.    Reviewed above with anesthesiologist Dr. Conrad [place].  Patient with steal syndrome and needs AVF ligated to prevent further hand ischemia. Procedure can likely be done with MAC and local. If patient is not having acute CV/CHF symptoms and labs are acceptable then it is anticipated that he can proceed with this procedure.    George Hugh [facility] Short Stay Center/Anesthesiology Phone [phone] 07/05/2014 2:17 PM

## 2014-07-05 NOTE — Anesthesia Preprocedure Evaluation (Addendum)
Anesthesia Evaluation  Patient identified by MRN, date of birth, ID band Patient awake    Reviewed: Allergy & Precautions, NPO status , Patient's Chart, lab work & pertinent test results, reviewed documented beta blocker date and time   Airway Mallampati: II   Neck ROM: Full    Dental  (+) Lower Dentures, Upper Dentures   Pulmonary shortness of breath (inhalers) and with exertion, former smoker,  breath sounds clear to auscultation        Cardiovascular hypertension, Pt. on medications and Pt. on home beta blockers + angina + CAD and + Peripheral Vascular Disease + Valvular Problems/Murmurs Rhythm:Regular  ECHO 2013 45-50% EF, has refused resent cardiac studies   Neuro/Psych Anxiety    GI/Hepatic PUD, GERD-  Medicated,(+)     substance abuse (perscription drugs)  IV drug use, Esophageal stricture   Endo/Other    Renal/GU Renal InsufficiencyRenal diseaseCreat 2.6     Musculoskeletal  (+) Arthritis -,   Abdominal (+)  Abdomen: soft.    Peds  Hematology  (+) anemia , 8/26 H/H   Anesthesia Other Findings   Reproductive/Obstetrics                         Anesthesia Physical Anesthesia Plan  ASA: IV  Anesthesia Plan: MAC   Post-op Pain Management:    Induction:   Airway Management Planned: Nasal Cannula and Natural Airway  Additional Equipment:   Intra-op Plan:   Post-operative Plan:   Informed Consent: I have reviewed the patients History and Physical, chart, labs and discussed the procedure including the risks, benefits and alternatives for the proposed anesthesia with the patient or authorized representative who has indicated his/her understanding and acceptance.     Plan Discussed with:   Anesthesia Plan Comments: (Would prefer MAC since no resent cardiac data and patient refused further cardio WU, procedure needs to get done as patient experiencing steal and hand ischemia.   Not needing dialysis yet)        Anesthesia Quick Evaluation

## 2014-07-06 ENCOUNTER — Ambulatory Visit (HOSPITAL_COMMUNITY): Payer: Medicare Other | Admitting: Vascular Surgery

## 2014-07-06 ENCOUNTER — Telehealth: Payer: Self-pay | Admitting: Vascular Surgery

## 2014-07-06 ENCOUNTER — Encounter (HOSPITAL_COMMUNITY)
Admission: RE | Disposition: A | Discharge status: Home / Self Care | Payer: Self-pay | Source: Ambulatory Visit | Attending: Vascular Surgery

## 2014-07-06 ENCOUNTER — Encounter (HOSPITAL_COMMUNITY): Payer: Self-pay | Admitting: *Deleted

## 2014-07-06 ENCOUNTER — Ambulatory Visit (HOSPITAL_COMMUNITY)
Admission: RE | Admit: 2014-07-06 | Discharge: 2014-07-06 | Disposition: A | Discharge status: Home / Self Care | Payer: Medicare Other | Source: Ambulatory Visit | Attending: Vascular Surgery | Admitting: Vascular Surgery

## 2014-07-06 DIAGNOSIS — I517 Cardiomegaly: Secondary | ICD-10-CM | POA: Diagnosis not present

## 2014-07-06 DIAGNOSIS — D631 Anemia in chronic kidney disease: Secondary | ICD-10-CM | POA: Insufficient documentation

## 2014-07-06 DIAGNOSIS — K219 Gastro-esophageal reflux disease without esophagitis: Secondary | ICD-10-CM | POA: Insufficient documentation

## 2014-07-06 DIAGNOSIS — Z87891 Personal history of nicotine dependence: Secondary | ICD-10-CM | POA: Insufficient documentation

## 2014-07-06 DIAGNOSIS — N189 Chronic kidney disease, unspecified: Secondary | ICD-10-CM | POA: Diagnosis not present

## 2014-07-06 DIAGNOSIS — M1990 Unspecified osteoarthritis, unspecified site: Secondary | ICD-10-CM | POA: Insufficient documentation

## 2014-07-06 DIAGNOSIS — Z7982 Long term (current) use of aspirin: Secondary | ICD-10-CM | POA: Diagnosis not present

## 2014-07-06 DIAGNOSIS — T829XXA Unspecified complication of cardiac and vascular prosthetic device, implant and graft, initial encounter: Secondary | ICD-10-CM | POA: Insufficient documentation

## 2014-07-06 DIAGNOSIS — F419 Anxiety disorder, unspecified: Secondary | ICD-10-CM | POA: Diagnosis not present

## 2014-07-06 DIAGNOSIS — Y832 Surgical operation with anastomosis, bypass or graft as the cause of abnormal reaction of the patient, or of later complication, without mention of misadventure at the time of the procedure: Secondary | ICD-10-CM | POA: Diagnosis not present

## 2014-07-06 DIAGNOSIS — I129 Hypertensive chronic kidney disease with stage 1 through stage 4 chronic kidney disease, or unspecified chronic kidney disease: Secondary | ICD-10-CM | POA: Diagnosis not present

## 2014-07-06 DIAGNOSIS — I739 Peripheral vascular disease, unspecified: Secondary | ICD-10-CM | POA: Insufficient documentation

## 2014-07-06 DIAGNOSIS — T82898A Other specified complication of vascular prosthetic devices, implants and grafts, initial encounter: Secondary | ICD-10-CM

## 2014-07-06 DIAGNOSIS — I2510 Atherosclerotic heart disease of native coronary artery without angina pectoris: Secondary | ICD-10-CM | POA: Diagnosis not present

## 2014-07-06 HISTORY — PX: LIGATION OF ARTERIOVENOUS  FISTULA: SHX5948

## 2014-07-06 LAB — POCT I-STAT 4, (NA,K, GLUC, HGB,HCT)
Glucose, Bld: 98 mg/dL (ref 70–99)
HEMATOCRIT: 27 % — AB (ref 39.0–52.0)
Hemoglobin: 9.2 g/dL — ABNORMAL LOW (ref 13.0–17.0)
Potassium: 4.6 mmol/L (ref 3.5–5.1)
Sodium: 140 mmol/L (ref 135–145)

## 2014-07-06 SURGERY — LIGATION OF ARTERIOVENOUS  FISTULA
Anesthesia: Monitor Anesthesia Care | Site: Arm Lower | Laterality: Left

## 2014-07-06 MED ORDER — GLYCOPYRROLATE 0.2 MG/ML IJ SOLN
INTRAMUSCULAR | Status: DC | PRN
  Administered 2014-07-06: 0.2 mg via INTRAVENOUS

## 2014-07-06 MED ORDER — ONDANSETRON HCL 4 MG/2ML IJ SOLN
INTRAMUSCULAR | Status: DC | PRN
  Administered 2014-07-06: 4 mg via INTRAVENOUS

## 2014-07-06 MED ORDER — FENTANYL CITRATE 0.05 MG/ML IJ SOLN
INTRAMUSCULAR | Status: DC | PRN
  Administered 2014-07-06: 50 ug via INTRAVENOUS

## 2014-07-06 MED ORDER — SUCCINYLCHOLINE CHLORIDE 20 MG/ML IJ SOLN
INTRAMUSCULAR | Status: AC
  Filled 2014-07-06: qty 1

## 2014-07-06 MED ORDER — ROCURONIUM BROMIDE 50 MG/5ML IV SOLN
INTRAVENOUS | Status: AC
  Filled 2014-07-06: qty 1

## 2014-07-06 MED ORDER — PROPOFOL INFUSION 10 MG/ML OPTIME
INTRAVENOUS | Status: DC | PRN
  Administered 2014-07-06: 25 ug/kg/min via INTRAVENOUS

## 2014-07-06 MED ORDER — PROPOFOL 10 MG/ML IV BOLUS
INTRAVENOUS | Status: AC
  Filled 2014-07-06: qty 20

## 2014-07-06 MED ORDER — 0.9 % SODIUM CHLORIDE (POUR BTL) OPTIME
TOPICAL | Status: DC | PRN
  Administered 2014-07-06: 1000 mL

## 2014-07-06 MED ORDER — LIDOCAINE HCL (PF) 1 % IJ SOLN
INTRAMUSCULAR | Status: DC | PRN
  Administered 2014-07-06: 8 mL via SUBCUTANEOUS

## 2014-07-06 MED ORDER — FENTANYL CITRATE 0.05 MG/ML IJ SOLN
INTRAMUSCULAR | Status: AC
  Filled 2014-07-06: qty 5

## 2014-07-06 MED ORDER — ACETAMINOPHEN-CODEINE #2 300-15 MG PO TABS: 1.0000 | ORAL_TABLET | ORAL | Status: DC | PRN

## 2014-07-06 MED ORDER — SODIUM CHLORIDE 0.9 % IV SOLN
INTRAVENOUS | Status: DC | PRN
Start: 2014-07-06 — End: 2014-07-06
  Administered 2014-07-06: 09:00:00 via INTRAVENOUS

## 2014-07-06 MED ORDER — LIDOCAINE HCL (PF) 1 % IJ SOLN
INTRAMUSCULAR | Status: AC
  Filled 2014-07-06: qty 30

## 2014-07-06 SURGICAL SUPPLY — 29 items
ADH SKN CLS LQ APL DERMABOND (GAUZE/BANDAGES/DRESSINGS) ×1
BLADE SURG 10 STRL SS (BLADE) ×2 IMPLANT
CANISTER SUCTION 2500CC (MISCELLANEOUS) ×2 IMPLANT
COVER SURGICAL LIGHT HANDLE (MISCELLANEOUS) ×2 IMPLANT
DERMABOND ADHESIVE PROPEN (GAUZE/BANDAGES/DRESSINGS) ×1
DERMABOND ADVANCED .7 DNX6 (GAUZE/BANDAGES/DRESSINGS) IMPLANT
ELECT REM PT RETURN 9FT ADLT (ELECTROSURGICAL) ×2
ELECTRODE REM PT RTRN 9FT ADLT (ELECTROSURGICAL) ×1 IMPLANT
GEL ULTRASOUND 20GR AQUASONIC (MISCELLANEOUS) ×2 IMPLANT
GLOVE BIO SURGEON STRL SZ7.5 (GLOVE) ×2 IMPLANT
GOWN STRL REUS W/ TWL LRG LVL3 (GOWN DISPOSABLE) ×3 IMPLANT
GOWN STRL REUS W/TWL LRG LVL3 (GOWN DISPOSABLE) ×6
KIT BASIN OR (CUSTOM PROCEDURE TRAY) ×2 IMPLANT
KIT ROOM TURNOVER OR (KITS) ×2 IMPLANT
LIQUID BAND (GAUZE/BANDAGES/DRESSINGS) ×2 IMPLANT
LOOP VESSEL MINI RED (MISCELLANEOUS) IMPLANT
NS IRRIG 1000ML POUR BTL (IV SOLUTION) ×2 IMPLANT
PACK CV ACCESS (CUSTOM PROCEDURE TRAY) ×2 IMPLANT
PAD ARMBOARD 7.5X6 YLW CONV (MISCELLANEOUS) ×4 IMPLANT
PROBE PENCIL 8 MHZ STRL DISP (MISCELLANEOUS) IMPLANT
SPONGE SURGIFOAM ABS GEL 100 (HEMOSTASIS) IMPLANT
SUT PROLENE 6 0 CC (SUTURE) IMPLANT
SUT SILK 0 (SUTURE) IMPLANT
SUT SILK 0 TIES 10X30 (SUTURE) ×1 IMPLANT
SUT VIC AB 3-0 SH 27 (SUTURE) ×2
SUT VIC AB 3-0 SH 27X BRD (SUTURE) ×1 IMPLANT
SUT VICRYL 4-0 PS2 18IN ABS (SUTURE) ×2 IMPLANT
UNDERPAD 30X30 INCONTINENT (UNDERPADS AND DIAPERS) ×2 IMPLANT
WATER STERILE IRR 1000ML POUR (IV SOLUTION) ×2 IMPLANT

## 2014-07-06 NOTE — Discharge Instructions (Signed)
What to eat: ° °For your first meals, you should eat lightly; only small meals initially.  If you do not have nausea, you may eat larger meals.  Avoid spicy, greasy and heavy food.   ° °General Anesthesia, Adult, Care After  °Refer to this sheet in the next few weeks. These instructions provide you with information on caring for yourself after your procedure. Your health care provider may also give you more specific instructions. Your treatment has been planned according to current medical practices, but problems sometimes occur. Call your health care provider if you have any problems or questions after your procedure.  °WHAT TO EXPECT AFTER THE PROCEDURE  °After the procedure, it is typical to experience:  °Sleepiness.  °Nausea and vomiting. °HOME CARE INSTRUCTIONS  °For the first 24 hours after general anesthesia:  °Have a responsible person with you.  °Do not drive a car. If you are alone, do not take public transportation.  °Do not drink alcohol.  °Do not take medicine that has not been prescribed by your health care provider.  °Do not sign important papers or make important decisions.  °You may resume a normal diet and activities as directed by your health care provider.  °Change bandages (dressings) as directed.  °If you have questions or problems that seem related to general anesthesia, call the hospital and ask for the anesthetist or anesthesiologist on call. °SEEK MEDICAL CARE IF:  °You have nausea and vomiting that continue the day after anesthesia.  °You develop a rash. °SEEK IMMEDIATE MEDICAL CARE IF:  °You have difficulty breathing.  °You have chest pain.  °You have any allergic problems. °Document Released: 08/27/2000 Document Revised: 01/21/2013 Document Reviewed: 12/04/2012  °ExitCare® Patient Information ©2014 [employer].  ° °Tissue Adhesive Wound Care  ° ° °Some cuts and wounds can be closed with tissue adhesive. Adhesive is like glue. It holds the skin together and helps a wound heal faster.  This adhesive goes away on its own as the wound heals.  °HOME CARE  °Showers are allowed. Do not soak the wound in water. Do not take baths, swim, or use hot tubs. Do not use soaps or creams on your wound.  °If a bandage (dressing) was put on, change it as often as told by your doctor.  °Keep the bandage dry.  °Do not scratch, pick, or rub the adhesive.  °Do not put tape over the adhesive. The adhesive could come off.  °Protect the wound from another injury.  °Protect the wound from sun and tanning beds.  °Only take medicine as told by your doctor.  °Keep all doctor visits as told. °GET HELP RIGHT AWAY IF:  °Your wound is red, puffy (swollen), hot, or tender.  °You get a rash after the glue is put on.  °You have more pain in the wound.  °You have a red streak going away from the wound.  °You have yellowish-white fluid (pus) coming from the wound.  °You have more bleeding.  °You have a fever.  °You have chills and start to shake.  °You notice a bad smell coming from the wound.  °Your wound or adhesive breaks open. °MAKE SURE YOU:  °Understand these instructions.  °Will watch your condition.  °Will get help right away if you are not doing well or get worse. °Document Released: 02/28/2008 Document Revised: 03/11/2013 Document Reviewed: 12/10/2012  °ExitCare® Patient Information ©2015 [employer]. This information is not intended to replace advice given to you by your health care provider.   Make sure you discuss any questions you have with your health care provider.   Sore Throat    A sore throat is a painful, burning, sore, or scratchy feeling of the throat. There may be pain or tenderness when swallowing or talking. You may have other symptoms with a sore throat. These include coughing, sneezing, fever, or a swollen neck. A sore throat is often the first sign of another sickness. These sicknesses may include a cold, flu, strep throat, or an infection called mono. Most sore throats go away without medical  treatment.  HOME CARE  Only take medicine as told by your doctor.  Drink enough fluids to keep your pee (urine) clear or pale yellow.  Rest as needed.  Try using throat sprays, lozenges, or suck on hard candy (if older than 4 years or as told).  Sip warm liquids, such as broth, herbal tea, or warm water with honey. Try sucking on frozen ice pops or drinking cold liquids.  Rinse the mouth (gargle) with salt water. Mix 1 teaspoon salt with 8 ounces of water.  Do not smoke. Avoid being around others when they are smoking.  Put a humidifier in your bedroom at night to moisten the air. You can also turn on a hot shower and sit in the bathroom for 5-10 minutes. Be sure the bathroom door is closed. GET HELP RIGHT AWAY IF:  You have trouble breathing.  You cannot swallow fluids, soft foods, or your spit (saliva).  You have more puffiness (swelling) in the throat.  Your sore throat does not get better in 7 days.  You feel sick to your stomach (nauseous) and throw up (vomit).  You have a fever or lasting symptoms for more than 2-3 days.  You have a fever and your symptoms suddenly get worse. MAKE SURE YOU:  Understand these instructions.  Will watch your condition.  Will get help right away if you are not doing well or get worse. Document Released: 02/28/2008 Document Revised: 02/13/2012 Document Reviewed: 01/27/2012  [facility] Patient Information 2015 [place], [place]. This information is not intended to replace advice given to you by your health care provider. Make sure you discuss any questions you have with your health care provider.

## 2014-07-06 NOTE — Anesthesia Postprocedure Evaluation (Signed)
  Anesthesia Post-op Note  Patient: [name]  Procedure(s) Performed: Procedure(s): LIGATION OF ARTERIOVENOUS  FISTULA (Left)  Patient Location: PACU  Anesthesia Type:MAC  Level of Consciousness: awake and alert   Airway and Oxygen Therapy: Patient Spontanous Breathing  Post-op Pain: mild  Post-op Assessment: Post-op Vital signs reviewed  Post-op Vital Signs: Reviewed and stable  Last Vitals:  Filed Vitals:   07/06/14 1051  BP: 160/59  Pulse: 51  Temp: 36.3 C  Resp: 20    Complications: No apparent anesthesia complications

## 2014-07-06 NOTE — Interval H&P Note (Signed)
History and Physical Interval Note:  07/06/2014 9:25 AM  [name]  has presented today for surgery, with the diagnosis of Numbness/tingling of left hand T82.898  The various methods of treatment have been discussed with the patient and family. After consideration of risks, benefits and other options for treatment, the patient has consented to  Procedure(s): LIGATION OF ARTERIOVENOUS  FISTULA (Left) as a surgical intervention .  The patient's history has been reviewed, patient examined, no change in status, stable for surgery.  I have reviewed the patient's chart and labs.  Questions were answered to the patient's satisfaction.     FIELDS,CHARLES E

## 2014-07-06 NOTE — Anesthesia Procedure Notes (Signed)
Procedure Name: MAC Date/Time: 07/06/2014 9:57 AM Performed by: Maeola Harman Pre-anesthesia Checklist: Patient identified, Emergency Drugs available, Suction available, Patient being monitored and Timeout performed Patient Re-evaluated:Patient Re-evaluated prior to inductionOxygen Delivery Method: Simple face mask Preoxygenation: Pre-oxygenation with 100% oxygen Intubation Type: IV induction Placement Confirmation: positive ETCO2 and breath sounds checked- equal and bilateral Dental Injury: Teeth and Oropharynx as per pre-operative assessment

## 2014-07-06 NOTE — Op Note (Signed)
Procedure: Ligation of left radialcephalic AV fistula  Preoperative diagnosis: Ischemic steal left hand  Postoperative diagnosis: Same  Anesthesia: Local with sedation  Assistant: Patric Dykes Surg Tech.  Operative findings: #1 double ligation of left radial cephalic AVF.  Palpable radial pulse.  Operative details: After obtaining informed consent, the patient was taken to the operating room. The patient was placed in supine position operating table. After adequate sedation, the patient's entire left upper extremity was prepped and draped in usual sterile fashion. A longitudinal incision was made through a pre-existing scar in the left wrist region. The incision was carried into the subcutaneous tissues down to level of a pre-existing left radialcephalic AV fistula. The fistula was dissected free circumferentially all the way down to the level of the radial artery anastomosis. The fistula was doubly ligated with a 0 silk tie.  The fistula had no flow at this point but the patient still had a palpable radial pulse. The subcutaneous tissues were then reapproximated using a running 3-0 Vicryl suture. The skin was closed with 4 0 Vicryl subcuticular stitch. Dermabond was applied. The patient tolerated procedure well there were no complications.  Sponge and needle counts were correct at the end of the case. Patient was taken to the recovery room in stable condition.  Ruta Hinds, MD [facility] Office: [phone] Pager: [phone]

## 2014-07-06 NOTE — Telephone Encounter (Addendum)
-----   Message from Sherrye Payor, RN sent at 07/06/2014  3:52 PM EST ----- Regarding: Zigmund Daniel log; also needs 2 wk. f/u w/ CEF   ----- Message -----    From: Elam Dutch, MD    Sent: 07/06/2014  10:29 AM      To: [facility] Charge Pool  Ligation left radial cephalic Nurse asst  Pt needs follow up appt in 2 weeks  Ruta Hinds  07/06/14: spoke with pt, he was hesitant to make the appt, stated he wasn't sure if he wanted to come back until his hand gets better (?) I tried to explain that this was in follow up of his surgery,dpm

## 2014-07-06 NOTE — H&P (View-Only) (Signed)
Patient is an 81 year old male who is status post placement of a left radiocephalic AV fistula June 1610. He did well with this until approximate 6 weeks ago he began to experience numbness and tingling in the first and second digit of the left hand. He is currently not on dialysis. He was seen by my partner Dr. Kellie Simmering several weeks ago and offered ligation of the fistula and placement of a new fistula. The patient returns today for further follow-up. At this point he is requesting removal of the left arm AV fistula.  Past Medical History  Diagnosis Date  . Chronic kidney disease   . Thrombocythemia   . Esophageal stricture   . Anemia   . Anemia due to chronic renal failure treated with erythropoietin 03/24/2011  . Retinal vein thrombosis, left   . Coronary artery disease   . Hypertension   . GERD (gastroesophageal reflux disease)   . Arthritis   . Anginal pain   . Erosive esophagitis     grade 4  . Diverticulosis   . Internal hemorrhoids     Past Surgical History  Procedure Laterality Date  . Replacement total knee Right 04/19/08  . Av fistula placement Left 12/01/2013    Procedure: RADIOCEPHALIC ARTERIOVENOUS (AV) FISTULA CREATION;  Surgeon: Elam Dutch, MD;  Location: [facility];  Service: Vascular;  Laterality: Left;  . Coronary artery bypass graft  2008    x 5  . Eye surgery Left yrs ago    vitrectomy  . Eye brow lift Right     01-11-2014  . Inguinal hernia repair Left 05/08/01  . Colonoscopy N/A 02/02/2014    Procedure: COLONOSCOPY;  Surgeon: Inda Castle, MD;  Location: [facility];  Service: Endoscopy;  Laterality: N/A;  . Esophagogastroduodenoscopy N/A 02/02/2014    Procedure: ESOPHAGOGASTRODUODENOSCOPY (EGD);  Surgeon: Inda Castle, MD;  Location: [facility];  Service: Endoscopy;  Laterality: N/A;   Current Outpatient Prescriptions on File Prior to Visit  Medication Sig Dispense Refill  . acyclovir (ZOVIRAX) 200 MG capsule Take 200 mg by mouth Daily.     Marland Kitchen  ALPRAZolam (XANAX) 0.25 MG tablet Take 0.25 mg by mouth 3 (three) times daily as needed for anxiety.     Marland Kitchen anagrelide (AGRYLIN) 1 MG capsule TAKE ONE CAPSULE BY MOUTH ONCE DAILY OR AS DIRECTED BY PHYSICIAN. 60 capsule 0  . aspirin 81 MG EC tablet Take 81 mg by mouth daily. Swallow whole.    . carboxymethylcellulose (REFRESH PLUS) 0.5 % SOLN Place 1 drop into both eyes 3 (three) times daily as needed (dry eyes).    . cloNIDine (CATAPRES) 0.3 MG tablet Take 0.3 mg by mouth 3 (three) times daily.      . fluticasone (FLONASE) 50 MCG/ACT nasal spray Place 1 spray into both nostrils daily as needed for allergies. As directed    . furosemide (LASIX) 40 MG tablet Take 60 mg by mouth daily.     . hydrALAZINE (APRESOLINE) 25 MG tablet take 1 tablet by mouth three times a day 90 tablet 10  . ketoconazole (NIZORAL) 2 % cream Apply 1 application topically daily as needed for irritation.     . metoprolol succinate (TOPROL-XL) 100 MG 24 hr tablet take 1 tablet by mouth twice a day with OR IMMEDIATELY AFTER A MEAL 60 tablet 10  . oxyCODONE (ROXICODONE) 5 MG immediate release tablet Take 1 tablet (5 mg total) by mouth every 6 (six) hours as needed for severe pain. 10 tablet 0  .  pantoprazole (PROTONIX) 40 MG tablet Take 1 tablet (40 mg total) by mouth daily. 30 tablet 3  . PROVENTIL HFA 108 (90 BASE) MCG/ACT inhaler Inhale 2 puffs into the lungs every 4 (four) hours as needed. Wheezing and/or shortness of breath    . psyllium (METAMUCIL) 58.6 % powder Take 1 packet by mouth daily as needed (constipation).     . Tamsulosin HCl (FLOMAX) 0.4 MG CAPS Take 0.4 mg by mouth daily after supper.     . triamcinolone cream (KENALOG) 0.1 % Apply 1 application topically daily as needed (itching).     . zolpidem (AMBIEN) 5 MG tablet Take 5 mg by mouth at bedtime as needed for sleep.      No current facility-administered medications on file prior to visit.   Allergies  Allergen Reactions  . Isosorbide Other (See Comments)     Caused a blood clot  . Norvasc [Amlodipine Besylate] Other (See Comments)    TIA's    Review of systems: He denies chest pain. He denies shortness of breath.  Physical exam:  Filed Vitals:   07/01/14 0848  BP: 134/53  Pulse: 54  Height: 5\' 8"  (1.727 m)  Weight: 161 lb 8 oz (73.256 kg)  SpO2: 99%    Extremities: 2+ radial pulse bilaterally palpable thrill audible bruit in fistula, well-healed radiocephalic incision  Data: A steel scan dated 06/08/2014 was reviewed. The patient had full restoration of flow to his left hand with compression of the fistula. This is consistent with ischemic steal.  Assessment: Steal left hand patient currently not on dialysis. Patient wishes to have the fistula removed. I discussed with the patient today ligation of the fistula and told him that hopefully will get improvement of symptoms but he could have some persistent numbness after the procedure. I also discussed with the patient placing a new fistula in the right upper extremity. However he prefers to defer working on the right arm until the left arm has healed significantly.  Plan: Ligation left arm AV fistula 07/06/2014. Risks benefits possible compilations of procedure details including not limited to bleeding infection persistent numbness and tingling in the hand. Patient understands and agrees to proceed.  Ruta Hinds, MD [facility] Office: [phone] Pager: [phone]

## 2014-07-06 NOTE — Transfer of Care (Signed)
Immediate Anesthesia Transfer of Care Note  Patient: [name]  Procedure(s) Performed: Procedure(s): LIGATION OF ARTERIOVENOUS  FISTULA (Left)  Patient Location: PACU  Anesthesia Type:MAC  Level of Consciousness: awake, alert  and oriented  Airway & Oxygen Therapy: Patient connected to face mask oxygen  Post-op Assessment: Report given to RN  Post vital signs: stable  Last Vitals:  Filed Vitals:   07/06/14 1035  BP: 149/78  Pulse:   Temp: 36.5 C  Resp: 20    Complications: No apparent anesthesia complications

## 2014-07-07 ENCOUNTER — Other Ambulatory Visit: Payer: Self-pay | Admitting: *Deleted

## 2014-07-07 ENCOUNTER — Telehealth: Payer: Self-pay | Admitting: *Deleted

## 2014-07-07 ENCOUNTER — Other Ambulatory Visit (HOSPITAL_BASED_OUTPATIENT_CLINIC_OR_DEPARTMENT_OTHER): Payer: Medicare Other

## 2014-07-07 DIAGNOSIS — D509 Iron deficiency anemia, unspecified: Secondary | ICD-10-CM

## 2014-07-07 DIAGNOSIS — D631 Anemia in chronic kidney disease: Secondary | ICD-10-CM

## 2014-07-07 DIAGNOSIS — N189 Chronic kidney disease, unspecified: Secondary | ICD-10-CM | POA: Diagnosis not present

## 2014-07-07 DIAGNOSIS — D473 Essential (hemorrhagic) thrombocythemia: Secondary | ICD-10-CM

## 2014-07-07 LAB — CBC WITH DIFFERENTIAL/PLATELET
BASO%: 1.4 % (ref 0.0–2.0)
BASOS ABS: 0.1 10*3/uL (ref 0.0–0.1)
EOS%: 3.8 % (ref 0.0–7.0)
Eosinophils Absolute: 0.2 10*3/uL (ref 0.0–0.5)
HEMATOCRIT: 26.2 % — AB (ref 38.4–49.9)
HGB: 7.8 g/dL — ABNORMAL LOW (ref 13.0–17.1)
LYMPH#: 0.6 10*3/uL — AB (ref 0.9–3.3)
LYMPH%: 15.2 % (ref 14.0–49.0)
MCH: 27.9 pg (ref 27.2–33.4)
MCHC: 29.9 g/dL — ABNORMAL LOW (ref 32.0–36.0)
MCV: 93.4 fL (ref 79.3–98.0)
MONO#: 0.4 10*3/uL (ref 0.1–0.9)
MONO%: 10.2 % (ref 0.0–14.0)
NEUT%: 69.4 % (ref 39.0–75.0)
NEUTROS ABS: 2.9 10*3/uL (ref 1.5–6.5)
Platelets: 241 10*3/uL (ref 140–400)
RBC: 2.81 10*6/uL — ABNORMAL LOW (ref 4.20–5.82)
RDW: 20.3 % — ABNORMAL HIGH (ref 11.0–14.6)
WBC: 4.2 10*3/uL (ref 4.0–10.3)

## 2014-07-07 LAB — HOLD TUBE, BLOOD BANK

## 2014-07-07 NOTE — Telephone Encounter (Signed)
Hgb 7.8 today.   Spoke with wife [name] at home and instructed wife to write down these instructions for pt : 1.  Pt is scheduled for blood transfusion at 11 am on  Fri.  07/09/14.   Pt needs to keep blue armband for identification on Friday. 2.  Pt can take am dose of Lasix 40mg  at home.  Pt will be given PM dose of Lasix 40mg  po here with blood transfusion at the cancer center.  Instructed wife/pt  Not to take PM dose of Lasix when he returns home.   Wife/Pt  Voiced understanding. Instructed wife/pt that between now and Fri, if pt experiences increased fatigue, increased shortness of breath, pt needs to go to ER for further evaluation.  Both voiced understanding.

## 2014-07-08 ENCOUNTER — Ambulatory Visit: Payer: Medicare Other | Admitting: Vascular Surgery

## 2014-07-08 ENCOUNTER — Encounter (HOSPITAL_COMMUNITY): Payer: Self-pay | Admitting: Vascular Surgery

## 2014-07-09 ENCOUNTER — Encounter (HOSPITAL_COMMUNITY): Payer: Self-pay | Admitting: Vascular Surgery

## 2014-07-09 ENCOUNTER — Ambulatory Visit (HOSPITAL_BASED_OUTPATIENT_CLINIC_OR_DEPARTMENT_OTHER): Payer: Medicare Other

## 2014-07-09 VITALS — BP 179/58 | HR 64 | Temp 98.8°F | Resp 18

## 2014-07-09 DIAGNOSIS — N189 Chronic kidney disease, unspecified: Secondary | ICD-10-CM

## 2014-07-09 DIAGNOSIS — D631 Anemia in chronic kidney disease: Secondary | ICD-10-CM

## 2014-07-09 LAB — PREPARE RBC (CROSSMATCH)

## 2014-07-09 MED ORDER — DIPHENHYDRAMINE HCL 25 MG PO CAPS
25.0000 mg | ORAL_CAPSULE | Freq: Once | ORAL | Status: AC
  Administered 2014-07-09: 25 mg via ORAL

## 2014-07-09 MED ORDER — ACETAMINOPHEN 325 MG PO TABS
650.0000 mg | ORAL_TABLET | Freq: Once | ORAL | Status: AC
  Administered 2014-07-09: 650 mg via ORAL

## 2014-07-09 MED ORDER — ACETAMINOPHEN 325 MG PO TABS
ORAL_TABLET | ORAL | Status: AC
  Filled 2014-07-09: qty 2

## 2014-07-09 MED ORDER — SODIUM CHLORIDE 0.9 % IV SOLN
250.0000 mL | Freq: Once | INTRAVENOUS | Status: AC
  Administered 2014-07-09: 250 mL via INTRAVENOUS

## 2014-07-09 MED ORDER — FUROSEMIDE 20 MG PO TABS
40.0000 mg | ORAL_TABLET | Freq: Once | ORAL | Status: AC
  Administered 2014-07-09: 40 mg via ORAL

## 2014-07-09 MED ORDER — FUROSEMIDE 20 MG PO TABS
ORAL_TABLET | ORAL | Status: AC
  Filled 2014-07-09: qty 2

## 2014-07-09 MED ORDER — DIPHENHYDRAMINE HCL 25 MG PO CAPS
ORAL_CAPSULE | ORAL | Status: AC
  Filled 2014-07-09: qty 1

## 2014-07-09 NOTE — Progress Notes (Signed)
Per Dr. Burr Medico, administer Lasix 40 mg po x 1 dose between blood products. Patient may take prn dose tonight of lasix if needed. Patient voices understanding.

## 2014-07-09 NOTE — Patient Instructions (Signed)

## 2014-07-09 NOTE — OR Nursing (Signed)
Late entry to chart regarding late entry into room.

## 2014-07-10 LAB — TYPE AND SCREEN
ABO/RH(D): A POS
Antibody Screen: NEGATIVE
Unit division: 0
Unit division: 0

## 2014-07-12 ENCOUNTER — Telehealth: Payer: Self-pay | Admitting: Gastroenterology

## 2014-07-13 MED ORDER — PANTOPRAZOLE SODIUM 40 MG PO TBEC: 40.0000 mg | DELAYED_RELEASE_TABLET | Freq: Every day | ORAL | Status: DC

## 2014-07-13 NOTE — Telephone Encounter (Signed)
Informed patient that prescription was sent to pharmacy.  

## 2014-07-14 ENCOUNTER — Other Ambulatory Visit: Payer: Self-pay | Admitting: *Deleted

## 2014-07-14 DIAGNOSIS — D631 Anemia in chronic kidney disease: Secondary | ICD-10-CM

## 2014-07-14 DIAGNOSIS — N189 Chronic kidney disease, unspecified: Secondary | ICD-10-CM

## 2014-07-14 DIAGNOSIS — D473 Essential (hemorrhagic) thrombocythemia: Secondary | ICD-10-CM

## 2014-07-15 ENCOUNTER — Other Ambulatory Visit (HOSPITAL_BASED_OUTPATIENT_CLINIC_OR_DEPARTMENT_OTHER): Payer: Medicare Other

## 2014-07-15 DIAGNOSIS — N189 Chronic kidney disease, unspecified: Secondary | ICD-10-CM

## 2014-07-15 DIAGNOSIS — D631 Anemia in chronic kidney disease: Secondary | ICD-10-CM

## 2014-07-15 DIAGNOSIS — D473 Essential (hemorrhagic) thrombocythemia: Secondary | ICD-10-CM

## 2014-07-15 LAB — CBC WITH DIFFERENTIAL/PLATELET
BASO%: 0.6 % (ref 0.0–2.0)
Basophils Absolute: 0 10*3/uL (ref 0.0–0.1)
EOS%: 2.7 % (ref 0.0–7.0)
Eosinophils Absolute: 0.2 10*3/uL (ref 0.0–0.5)
HCT: 32 % — ABNORMAL LOW (ref 38.4–49.9)
HGB: 9.6 g/dL — ABNORMAL LOW (ref 13.0–17.1)
LYMPH%: 11.4 % — AB (ref 14.0–49.0)
MCH: 28.1 pg (ref 27.2–33.4)
MCHC: 30 g/dL — AB (ref 32.0–36.0)
MCV: 93.6 fL (ref 79.3–98.0)
MONO#: 0.6 10*3/uL (ref 0.1–0.9)
MONO%: 10 % (ref 0.0–14.0)
NEUT%: 75.3 % — ABNORMAL HIGH (ref 39.0–75.0)
NEUTROS ABS: 4.4 10*3/uL (ref 1.5–6.5)
PLATELETS: 284 10*3/uL (ref 140–400)
RBC: 3.42 10*6/uL — AB (ref 4.20–5.82)
RDW: 21 % — ABNORMAL HIGH (ref 11.0–14.6)
WBC: 5.9 10*3/uL (ref 4.0–10.3)
lymph#: 0.7 10*3/uL — ABNORMAL LOW (ref 0.9–3.3)

## 2014-07-20 ENCOUNTER — Encounter: Payer: Self-pay | Admitting: Vascular Surgery

## 2014-07-21 ENCOUNTER — Telehealth: Payer: Self-pay | Admitting: Gastroenterology

## 2014-07-21 ENCOUNTER — Encounter: Payer: Medicare Other | Admitting: Vascular Surgery

## 2014-07-21 ENCOUNTER — Other Ambulatory Visit: Payer: Self-pay

## 2014-07-21 MED ORDER — PANTOPRAZOLE SODIUM 40 MG PO TBEC: 40.0000 mg | DELAYED_RELEASE_TABLET | Freq: Two times a day (BID) | ORAL | Status: DC

## 2014-07-21 NOTE — Telephone Encounter (Signed)
Line busy

## 2014-07-21 NOTE — Telephone Encounter (Signed)
Patient's prescription for Pantoprazole was to be BID dosing. The PA was done in the fall. Contacted the pharmacist who will change this and let us know if there are any problems.

## 2014-07-23 ENCOUNTER — Telehealth: Payer: Self-pay | Admitting: Hematology

## 2014-07-23 ENCOUNTER — Other Ambulatory Visit: Payer: Self-pay | Admitting: Hematology

## 2014-07-23 NOTE — Telephone Encounter (Signed)
Returned patient call after getting disconnected and left message to confirm appointment 02/22.

## 2014-07-26 ENCOUNTER — Ambulatory Visit (HOSPITAL_BASED_OUTPATIENT_CLINIC_OR_DEPARTMENT_OTHER): Payer: Medicare Other

## 2014-07-26 ENCOUNTER — Telehealth: Payer: Self-pay | Admitting: Hematology

## 2014-07-26 ENCOUNTER — Other Ambulatory Visit (HOSPITAL_BASED_OUTPATIENT_CLINIC_OR_DEPARTMENT_OTHER): Payer: Medicare Other

## 2014-07-26 ENCOUNTER — Ambulatory Visit (HOSPITAL_BASED_OUTPATIENT_CLINIC_OR_DEPARTMENT_OTHER): Payer: Medicare Other | Admitting: Hematology

## 2014-07-26 VITALS — BP 134/55 | HR 56 | Temp 97.6°F | Resp 18 | Ht 68.0 in | Wt 158.4 lb

## 2014-07-26 DIAGNOSIS — N183 Chronic kidney disease, stage 3 unspecified: Secondary | ICD-10-CM

## 2014-07-26 DIAGNOSIS — N189 Chronic kidney disease, unspecified: Secondary | ICD-10-CM

## 2014-07-26 DIAGNOSIS — D631 Anemia in chronic kidney disease: Secondary | ICD-10-CM

## 2014-07-26 DIAGNOSIS — D509 Iron deficiency anemia, unspecified: Secondary | ICD-10-CM

## 2014-07-26 DIAGNOSIS — D473 Essential (hemorrhagic) thrombocythemia: Secondary | ICD-10-CM

## 2014-07-26 DIAGNOSIS — D649 Anemia, unspecified: Secondary | ICD-10-CM

## 2014-07-26 LAB — CBC WITH DIFFERENTIAL/PLATELET
BASO%: 0.6 % (ref 0.0–2.0)
BASOS ABS: 0 10*3/uL (ref 0.0–0.1)
EOS ABS: 0.2 10*3/uL (ref 0.0–0.5)
EOS%: 3.3 % (ref 0.0–7.0)
HCT: 25.1 % — ABNORMAL LOW (ref 38.4–49.9)
HGB: 8 g/dL — ABNORMAL LOW (ref 13.0–17.1)
LYMPH#: 0.8 10*3/uL — AB (ref 0.9–3.3)
LYMPH%: 14 % (ref 14.0–49.0)
MCH: 29 pg (ref 27.2–33.4)
MCHC: 31.9 g/dL — ABNORMAL LOW (ref 32.0–36.0)
MCV: 90.9 fL (ref 79.3–98.0)
MONO#: 0.4 10*3/uL (ref 0.1–0.9)
MONO%: 6.8 % (ref 0.0–14.0)
NEUT%: 75.3 % — ABNORMAL HIGH (ref 39.0–75.0)
NEUTROS ABS: 4.2 10*3/uL (ref 1.5–6.5)
PLATELETS: 326 10*3/uL (ref 140–400)
RBC: 2.76 10*6/uL — ABNORMAL LOW (ref 4.20–5.82)
RDW: 19 % — ABNORMAL HIGH (ref 11.0–14.6)
WBC: 5.6 10*3/uL (ref 4.0–10.3)

## 2014-07-26 MED ORDER — DARBEPOETIN ALFA 300 MCG/0.6ML IJ SOSY
300.0000 ug | PREFILLED_SYRINGE | Freq: Once | INTRAMUSCULAR | Status: AC
  Administered 2014-07-26: 300 ug via SUBCUTANEOUS
  Filled 2014-07-26: qty 0.6

## 2014-07-26 MED ORDER — ANAGRELIDE HCL 1 MG PO CAPS: ORAL_CAPSULE | ORAL | Status: DC

## 2014-07-26 NOTE — Progress Notes (Signed)
[facility]EMAT[facility] OFFICE PROGRESS NOTE DATE OF VISIT: 04/05/2014  [name], MD [place] [address]  DIAGNOSIS: Thrombocythemia, essential - Plan: anagrelide (AGRYLIN) 1 MG capsule  Essential thrombocythemia  Chief Complaint  Patient presents with  . Follow-up    anemia, elevated platelets    CURRENT AND PREVIOUS THERAPY:  1. Aspirin 81 mg by mouth daily.  2. Anagrelide (Agrylin) 1 mg by mouth daily, stopped on 07/03/2014, resumed on 07/27/2014.  3. Aranesp 300 mcg subcu every 2 weeks for hemoglobin less than 11.   INTERVAL HISTORY:  Worth Kober Hord 81 y.o. male history of essential thrombocythemia which was diagnosed in 09/1993 and anemia due to chronic renal failure treated with erythropoietin is here for follow-up.   He is doing well overall. He does have moderate fatigue, but able to function well at home. He denies any chest pain, or other new symptoms.  MEDICAL HISTORY: Past Medical History  Diagnosis Date  . Chronic kidney disease   . Thrombocythemia   . Esophageal stricture   . Anemia   . Anemia due to chronic renal failure treated with erythropoietin 03/24/2011  . Retinal vein thrombosis, left   . Coronary artery disease   . Hypertension   . GERD (gastroesophageal reflux disease)   . Arthritis   . Anginal pain   . Erosive esophagitis     grade 4  . Diverticulosis   . Internal hemorrhoids     ALLERGIES:  is allergic to isosorbide and norvasc.  MEDICATIONS: has a current medication list which includes the following prescription(s): acetaminophen-codeine, acyclovir, alprazolam, aspirin, carboxymethylcellulose, fluticasone, furosemide, hydralazine, ketoconazole, metoprolol succinate, oxycodone, pantoprazole, proventil hfa, psyllium, tamsulosin, triamcinolone cream, and anagrelide.  SURGICAL HISTORY:  Past Surgical History  Procedure Laterality Date  . Replacement total knee Right 04/19/08  . Av fistula placement Left  12/01/2013    Procedure: RADIOCEPHALIC ARTERIOVENOUS (AV) FISTULA CREATION;  Surgeon: Elam Dutch, MD;  Location: [facility];  Service: Vascular;  Laterality: Left;  . Coronary artery bypass graft  2008    x 5  . Eye surgery Left yrs ago    vitrectomy  . Eye brow lift Right     01-11-2014  . Inguinal hernia repair Left 05/08/01  . Colonoscopy N/A 02/02/2014    Procedure: COLONOSCOPY;  Surgeon: Inda Castle, MD;  Location: [facility];  Service: Endoscopy;  Laterality: N/A;  . Esophagogastroduodenoscopy N/A 02/02/2014    Procedure: ESOPHAGOGASTRODUODENOSCOPY (EGD);  Surgeon: Inda Castle, MD;  Location: [facility];  Service: Endoscopy;  Laterality: N/A;  . Ligation of arteriovenous  fistula Left 07/06/2014    Procedure: LIGATION OF ARTERIOVENOUS  FISTULA;  Surgeon: Elam Dutch, MD;  Location: [facility];  Service: Vascular;  Laterality: Left;    REVIEW OF SYSTEMS:   Constitutional: Denies fevers, chills or abnormal weight loss, (+) fatigue  Eyes: Denies blurriness of vision Ears, nose, mouth, throat, and face: Denies mucositis or sore throat Respiratory: Denies cough, dyspnea or wheezes Cardiovascular: Denies palpitation, chest discomfort or lower extremity swelling Gastrointestinal:  Denies nausea, heartburn or change in bowel habits Skin: Denies abnormal skin rashes Lymphatics: Denies new lymphadenopathy or easy bruising Neurological:Denies numbness, tingling or new weaknesses. Balance has improved a little Behavioral/Psych: Mood is stable, no new changes  All other systems were reviewed with the patient and are negative.  PHYSICAL EXAMINATION: ECOG PERFORMANCE STATUS: 1  Blood pressure 134/55, pulse 56, temperature 97.6 F (36.4 C), temperature source Oral, resp. rate 18,  height _0  (1.727 m), weight 158 lb 6.4 oz (71.85 kg).  GENERAL:alert, no distress and comfortable, elderly male; moves limbs spontaneously through out exams SKIN: skin color, texture, turgor are normal, no  rashes EYES: normal, Conjunctiva are pink and non-injected, sclera clear OROPHARYNX:no exudate, no erythema and lips, buccal mucosa, and tongue normal  NECK: supple, thyroid normal size, non-tender, without nodularity LYMPH:  no palpable lymphadenopathy in the cervical, axillary or inguinal LUNGS: clear to auscultation and percussion with normal breathing effort HEART: regular rate & rhythm and no murmurs and no lower extremity edema ABDOMEN:abdomen soft, non-tender and normal bowel sounds Musculoskeletal:no peripheral edema NEURO: alert & oriented x 3 with fluent speech, no focal motor/sensory deficits; shuffling gait noted.    LABORATORY DATA: CBC Latest Ref Rng 07/26/2014 07/15/2014 07/07/2014  WBC 4.0 - 10.3 10e3/uL 5.6 5.9 4.2  Hemoglobin 13.0 - 17.1 g/dL 8.0(L) 9.6(L) 7.8(L)  Hematocrit 38.4 - 49.9 % 25.1(L) 32.0(L) 26.2(L)  Platelets 140 - 400 10e3/uL 326 284 241    CMP Latest Ref Rng 07/06/2014 03/08/2014 01/11/2014  Glucose 70 - 99 mg/dL 98 112 97  BUN 7.0 - 26.0 mg/dL - 79.0(H) 93(H)  Creatinine 0.7 - 1.3 mg/dL - 2.5(H) 2.86(H)  Sodium 135 - 145 mmol/L 140 133(L) 139  Potassium 3.5 - 5.1 mmol/L 4.6 4.3 4.9  Chloride 96 - 112 mEq/L - - 102  CO2 22 - 29 mEq/L - 24 22  Calcium 8.4 - 10.4 mg/dL - 8.6 8.7  Total Protein 6.4 - 8.3 g/dL - 5.6(L) -  Albumin 3.5 - 4.8 g/dL - - -  Total Bilirubin 0.20 - 1.20 mg/dL - 0.31 -  Alkaline Phos 40 - 150 U/L - 77 -  AST 5 - 34 U/L - 13 -  ALT 0 - 55 U/L - 14 -      RADIOGRAPHIC STUDIES: No results found.  ASSESSMENT/PLAN:   1. Essential Thrombocythemia (09/1993).  Jak2 V617F, exon 12 and 13 mutation (-)  --His platelet count today is 326K, increased from before since she stopped anagrelide 3 weeks ago. -She is very concerned about thrombosis from hyperbaric count. She lost his left vision due to thrombosis secondary to ET. -His anemia did not improve much after we held his anagrelide. I'll resume his anagrelide at the same dose. -Due  to his worsening anemia lately, I'm concerned about his ET may have evolved to myelofibrosis or leukemia. I discussed a bone marrow biopsy, but he declines at this point due to his pending surgery.   2. Anemia secondary to chronic renal disease and iron deficiency and iron deficieny --His hemoglobin improved to 9.6 after 2 units of blood transfusion, but down to 8.0 again today.  He has refused bone marrow biopsy multiple times due to his age - Aranesp injection today, and increase to every 2 weeks to keep his hemoglobin 10-11 -Blood transfusion if his Hb less than 7.5 or symptomatic.   3. Shuffling gait, episodic movements. --He will follow up with his neurology. He is on sinemet. He was again counseled on fall precautions.   4. CKD -Stable, follow-up with nephrology. -He has a AV fistula in his left forearm, is scheduled to have some revision next week due to his hand numbness.  All questions were answered. The patient knows to call the clinic with any problems, questions or concerns. We can certainly see the patient much sooner if necessary.  I spent 20 minutes counseling the patient face to face. The total time spent in  the appointment was 25 minutes.    Plan: -resume anagrelide -cont aranesp q2w X4 -RTC in 4 weeks  -blood transfusion if Hb<7.5 or symptomatic   Truitt Merle 07/26/2014

## 2014-07-26 NOTE — Telephone Encounter (Signed)
Gave avs & calendar for March. °

## 2014-07-27 ENCOUNTER — Encounter: Payer: Self-pay | Admitting: Hematology

## 2014-07-28 ENCOUNTER — Ambulatory Visit: Payer: Medicare Other | Admitting: Podiatry

## 2014-08-09 ENCOUNTER — Ambulatory Visit (HOSPITAL_BASED_OUTPATIENT_CLINIC_OR_DEPARTMENT_OTHER): Payer: Medicare Other

## 2014-08-09 DIAGNOSIS — D473 Essential (hemorrhagic) thrombocythemia: Secondary | ICD-10-CM

## 2014-08-09 DIAGNOSIS — D631 Anemia in chronic kidney disease: Secondary | ICD-10-CM

## 2014-08-09 DIAGNOSIS — N189 Chronic kidney disease, unspecified: Secondary | ICD-10-CM | POA: Diagnosis not present

## 2014-08-09 DIAGNOSIS — N184 Chronic kidney disease, stage 4 (severe): Principal | ICD-10-CM

## 2014-08-09 LAB — CBC WITH DIFFERENTIAL/PLATELET
BASO%: 0.8 % (ref 0.0–2.0)
Basophils Absolute: 0 10*3/uL (ref 0.0–0.1)
EOS%: 4.7 % (ref 0.0–7.0)
Eosinophils Absolute: 0.2 10*3/uL (ref 0.0–0.5)
HEMATOCRIT: 25.4 % — AB (ref 38.4–49.9)
HEMOGLOBIN: 7.7 g/dL — AB (ref 13.0–17.1)
LYMPH%: 12.6 % — ABNORMAL LOW (ref 14.0–49.0)
MCH: 28.9 pg (ref 27.2–33.4)
MCHC: 30.5 g/dL — AB (ref 32.0–36.0)
MCV: 94.6 fL (ref 79.3–98.0)
MONO#: 0.5 10*3/uL (ref 0.1–0.9)
MONO%: 9.6 % (ref 0.0–14.0)
NEUT#: 3.8 10*3/uL (ref 1.5–6.5)
NEUT%: 72.3 % (ref 39.0–75.0)
Platelets: 355 10*3/uL (ref 140–400)
RBC: 2.68 10*6/uL — ABNORMAL LOW (ref 4.20–5.82)
RDW: 20.3 % — ABNORMAL HIGH (ref 11.0–14.6)
WBC: 5.3 10*3/uL (ref 4.0–10.3)
lymph#: 0.7 10*3/uL — ABNORMAL LOW (ref 0.9–3.3)

## 2014-08-09 LAB — HOLD TUBE, BLOOD BANK

## 2014-08-09 MED ORDER — DARBEPOETIN ALFA 300 MCG/0.6ML IJ SOSY
300.0000 ug | PREFILLED_SYRINGE | Freq: Once | INTRAMUSCULAR | Status: AC
  Administered 2014-08-09: 300 ug via SUBCUTANEOUS
  Filled 2014-08-09: qty 0.6

## 2014-08-09 NOTE — Progress Notes (Unsigned)
Offered blood transfusion to pt & he states that he feels good right now & would like to hold off on blood transfusion for now.  He wants to get his aranesp inj. & will let us know if he changes his mind.  Informed that blood bank tube only good for 72 hours.  He expressed understanding.

## 2014-08-16 ENCOUNTER — Other Ambulatory Visit (HOSPITAL_BASED_OUTPATIENT_CLINIC_OR_DEPARTMENT_OTHER): Payer: Medicare Other

## 2014-08-16 ENCOUNTER — Ambulatory Visit (HOSPITAL_BASED_OUTPATIENT_CLINIC_OR_DEPARTMENT_OTHER): Payer: Medicare Other

## 2014-08-16 ENCOUNTER — Other Ambulatory Visit: Payer: Self-pay | Admitting: *Deleted

## 2014-08-16 ENCOUNTER — Ambulatory Visit (HOSPITAL_COMMUNITY)
Admission: RE | Admit: 2014-08-16 | Discharge: 2014-08-16 | Disposition: A | Discharge status: Home / Self Care | Payer: Medicare Other | Source: Ambulatory Visit | Attending: Internal Medicine | Admitting: Internal Medicine

## 2014-08-16 ENCOUNTER — Telehealth: Payer: Self-pay | Admitting: *Deleted

## 2014-08-16 VITALS — BP 121/49 | HR 57 | Temp 97.0°F | Resp 18

## 2014-08-16 DIAGNOSIS — D509 Iron deficiency anemia, unspecified: Secondary | ICD-10-CM

## 2014-08-16 DIAGNOSIS — N189 Chronic kidney disease, unspecified: Secondary | ICD-10-CM

## 2014-08-16 DIAGNOSIS — D631 Anemia in chronic kidney disease: Secondary | ICD-10-CM | POA: Diagnosis not present

## 2014-08-16 DIAGNOSIS — D508 Other iron deficiency anemias: Secondary | ICD-10-CM

## 2014-08-16 DIAGNOSIS — D539 Nutritional anemia, unspecified: Secondary | ICD-10-CM

## 2014-08-16 DIAGNOSIS — R0609 Other forms of dyspnea: Principal | ICD-10-CM

## 2014-08-16 LAB — HOLD TUBE, BLOOD BANK

## 2014-08-16 LAB — CBC WITH DIFFERENTIAL/PLATELET
BASO%: 0.4 % (ref 0.0–2.0)
Basophils Absolute: 0 10*3/uL (ref 0.0–0.1)
EOS%: 4.3 % (ref 0.0–7.0)
Eosinophils Absolute: 0.2 10*3/uL (ref 0.0–0.5)
HEMATOCRIT: 25.2 % — AB (ref 38.4–49.9)
HGB: 7.7 g/dL — ABNORMAL LOW (ref 13.0–17.1)
LYMPH%: 14.9 % (ref 14.0–49.0)
MCH: 29.7 pg (ref 27.2–33.4)
MCHC: 30.6 g/dL — ABNORMAL LOW (ref 32.0–36.0)
MCV: 97.3 fL (ref 79.3–98.0)
MONO#: 0.5 10*3/uL (ref 0.1–0.9)
MONO%: 9.3 % (ref 0.0–14.0)
NEUT#: 3.4 10*3/uL (ref 1.5–6.5)
NEUT%: 71.1 % (ref 39.0–75.0)
Platelets: 292 10*3/uL (ref 140–400)
RBC: 2.59 10*6/uL — ABNORMAL LOW (ref 4.20–5.82)
RDW: 18.7 % — AB (ref 11.0–14.6)
WBC: 4.8 10*3/uL (ref 4.0–10.3)
lymph#: 0.7 10*3/uL — ABNORMAL LOW (ref 0.9–3.3)

## 2014-08-16 LAB — PREPARE RBC (CROSSMATCH)

## 2014-08-16 MED ORDER — SODIUM CHLORIDE 0.9 % IV SOLN
250.0000 mL | Freq: Once | INTRAVENOUS | Status: AC
  Administered 2014-08-16: 250 mL via INTRAVENOUS

## 2014-08-16 MED ORDER — DIPHENHYDRAMINE HCL 25 MG PO CAPS
25.0000 mg | ORAL_CAPSULE | Freq: Once | ORAL | Status: AC
  Administered 2014-08-16: 25 mg via ORAL

## 2014-08-16 MED ORDER — DIPHENHYDRAMINE HCL 25 MG PO CAPS
ORAL_CAPSULE | ORAL | Status: AC
  Filled 2014-08-16: qty 1

## 2014-08-16 MED ORDER — ACETAMINOPHEN 325 MG PO TABS
ORAL_TABLET | ORAL | Status: AC
  Filled 2014-08-16: qty 2

## 2014-08-16 MED ORDER — FUROSEMIDE 20 MG PO TABS: 40.0000 mg | ORAL_TABLET | Freq: Once | ORAL | Status: DC

## 2014-08-16 MED ORDER — ACETAMINOPHEN 325 MG PO TABS
650.0000 mg | ORAL_TABLET | Freq: Once | ORAL | Status: AC
  Administered 2014-08-16: 650 mg via ORAL

## 2014-08-16 NOTE — Patient Instructions (Signed)

## 2014-08-16 NOTE — Telephone Encounter (Signed)
Set up pt for lab, T&C & 2 units of PRBC for today at [facility].  Called pt & he will be here @ 11:45 am for lab. Lasix order placed 40 mg after blood but if taken at home, we do not have to give.

## 2014-08-16 NOTE — Telephone Encounter (Signed)
Verbal order received and read back from Dr. Burr Medico for collaborative nurse to arrange transfusion.

## 2014-08-16 NOTE — Telephone Encounter (Signed)
Patient called requesting "to be worked in for blood transfusion today".  Asked what symptoms he is having and reports weakness.  "I was there last weak and now my legs are weak and I think I'd better get some blood." Noted 08-09-2014 Hgb = 7.7 before aranesp injection.  Will notify Dr. Burr Medico about his weakness and request for transfusion.  Return call back number [phone].

## 2014-08-17 ENCOUNTER — Ambulatory Visit (HOSPITAL_COMMUNITY)
Admission: RE | Admit: 2014-08-17 | Discharge: 2014-08-17 | Disposition: A | Discharge status: Home / Self Care | Payer: Medicare Other | Source: Ambulatory Visit | Attending: Hematology | Admitting: Hematology

## 2014-08-17 VITALS — BP 127/54 | HR 56 | Temp 97.5°F | Resp 18

## 2014-08-17 DIAGNOSIS — D508 Other iron deficiency anemias: Secondary | ICD-10-CM

## 2014-08-17 LAB — PREPARE RBC (CROSSMATCH)

## 2014-08-17 MED ORDER — DIPHENHYDRAMINE HCL 25 MG PO CAPS
25.0000 mg | ORAL_CAPSULE | Freq: Once | ORAL | Status: AC
  Administered 2014-08-17: 25 mg via ORAL
  Filled 2014-08-17: qty 1

## 2014-08-17 MED ORDER — ACETAMINOPHEN 325 MG PO TABS
650.0000 mg | ORAL_TABLET | Freq: Once | ORAL | Status: AC
  Administered 2014-08-17: 650 mg via ORAL
  Filled 2014-08-17: qty 2

## 2014-08-17 MED ORDER — SODIUM CHLORIDE 0.9 % IV SOLN: 250.0000 mL | Freq: Once | INTRAVENOUS | Status: DC

## 2014-08-17 NOTE — Procedures (Signed)
Dr Burr Medico Dx: D50.8 IV started, premeds given, and 1 unit PRBC's given Patient tolerated, stable, ambulatory at discharge

## 2014-08-18 LAB — TYPE AND SCREEN
ABO/RH(D): A POS
ANTIBODY SCREEN: NEGATIVE
UNIT DIVISION: 0
Unit division: 0

## 2014-08-23 ENCOUNTER — Ambulatory Visit: Payer: Medicare Other

## 2014-08-23 ENCOUNTER — Other Ambulatory Visit (HOSPITAL_BASED_OUTPATIENT_CLINIC_OR_DEPARTMENT_OTHER): Payer: Medicare Other

## 2014-08-23 ENCOUNTER — Encounter: Payer: Self-pay | Admitting: Hematology

## 2014-08-23 ENCOUNTER — Ambulatory Visit: Payer: Medicare Other | Admitting: Oncology

## 2014-08-23 ENCOUNTER — Ambulatory Visit (HOSPITAL_BASED_OUTPATIENT_CLINIC_OR_DEPARTMENT_OTHER): Payer: Medicare Other | Admitting: Hematology

## 2014-08-23 ENCOUNTER — Telehealth: Payer: Self-pay | Admitting: Hematology

## 2014-08-23 ENCOUNTER — Other Ambulatory Visit: Payer: Medicare Other

## 2014-08-23 VITALS — BP 159/99 | HR 60 | Temp 97.6°F | Resp 18 | Ht 68.0 in | Wt 156.9 lb

## 2014-08-23 DIAGNOSIS — N184 Chronic kidney disease, stage 4 (severe): Secondary | ICD-10-CM

## 2014-08-23 DIAGNOSIS — D473 Essential (hemorrhagic) thrombocythemia: Secondary | ICD-10-CM

## 2014-08-23 DIAGNOSIS — N189 Chronic kidney disease, unspecified: Secondary | ICD-10-CM | POA: Diagnosis not present

## 2014-08-23 DIAGNOSIS — D631 Anemia in chronic kidney disease: Secondary | ICD-10-CM | POA: Diagnosis not present

## 2014-08-23 DIAGNOSIS — I10 Essential (primary) hypertension: Secondary | ICD-10-CM

## 2014-08-23 DIAGNOSIS — N183 Chronic kidney disease, stage 3 (moderate): Principal | ICD-10-CM

## 2014-08-23 DIAGNOSIS — I120 Hypertensive chronic kidney disease with stage 5 chronic kidney disease or end stage renal disease: Secondary | ICD-10-CM | POA: Diagnosis not present

## 2014-08-23 LAB — CBC WITH DIFFERENTIAL/PLATELET
BASO%: 1.2 % (ref 0.0–2.0)
Basophils Absolute: 0.1 10*3/uL (ref 0.0–0.1)
EOS%: 4.7 % (ref 0.0–7.0)
Eosinophils Absolute: 0.2 10*3/uL (ref 0.0–0.5)
HEMATOCRIT: 24.7 % — AB (ref 38.4–49.9)
HGB: 7.6 g/dL — ABNORMAL LOW (ref 13.0–17.1)
LYMPH%: 13.9 % — AB (ref 14.0–49.0)
MCH: 28.9 pg (ref 27.2–33.4)
MCHC: 30.8 g/dL — ABNORMAL LOW (ref 32.0–36.0)
MCV: 93.8 fL (ref 79.3–98.0)
MONO#: 0.5 10*3/uL (ref 0.1–0.9)
MONO%: 9.9 % (ref 0.0–14.0)
NEUT%: 70.3 % (ref 39.0–75.0)
NEUTROS ABS: 3.4 10*3/uL (ref 1.5–6.5)
PLATELETS: 338 10*3/uL (ref 140–400)
RBC: 2.64 10*6/uL — ABNORMAL LOW (ref 4.20–5.82)
RDW: 18.7 % — ABNORMAL HIGH (ref 11.0–14.6)
WBC: 4.8 10*3/uL (ref 4.0–10.3)
lymph#: 0.7 10*3/uL — ABNORMAL LOW (ref 0.9–3.3)

## 2014-08-23 LAB — HOLD TUBE, BLOOD BANK

## 2014-08-23 LAB — RETICULOCYTES
Immature Retic Fract: 6.2 % (ref 3.00–10.60)
RBC: 2.63 10*6/uL — ABNORMAL LOW (ref 4.20–5.82)
RETIC %: 3.68 % — AB (ref 0.80–1.80)
Retic Ct Abs: 96.78 10*3/uL — ABNORMAL HIGH (ref 34.80–93.90)

## 2014-08-23 MED ORDER — DARBEPOETIN ALFA 300 MCG/0.6ML IJ SOSY: 300.0000 ug | PREFILLED_SYRINGE | Freq: Once | INTRAMUSCULAR | Status: DC

## 2014-08-23 NOTE — Progress Notes (Signed)
BP elevated today 159/99.   Rechecked manually in right arm  160/60-54   Per Dr Burr Medico we will hold Aranesp today and have him come back in one week for lab, injection and blood transfusion.  Patient aware and agreeable with this plan.

## 2014-08-23 NOTE — Telephone Encounter (Signed)
Gave avs & calendar for March/April. Sent message to schedule blood

## 2014-08-23 NOTE — Progress Notes (Signed)
[facility]EMAT[facility] OFFICE PROGRESS NOTE DATE OF VISIT: 04/05/2014  Bryan Hatchet, MD [name] [id number]  DIAGNOSIS: Anemia due to chronic renal failure treated with erythropoietin, unspecified stage - Plan: Lactate dehydrogenase (LDH) - [facility], Ferritin, Iron and TIBC [facility], Reticulocyte Count, Haptoglobin, Folate RBC  Essential hypertension  CKD (chronic kidney disease), stage 4 (severe)  Essential thrombocythemia  CURRENT AND PREVIOUS THERAPY:  1. Aspirin 81 mg by mouth daily.  2. Anagrelide (Agrylin) 1 mg by mouth daily, stopped on 07/03/2014, resumed on 07/27/2014.  3. Aranesp 300 mcg subcu every 2 weeks for hemoglobin less than 11.  4. Blood transfusion for symptomatic anemia, hemoglobin less than 8  INTERVAL HISTORY:  [name] 81 y.o. male history of essential thrombocythemia which was diagnosed in 09/1993 and anemia due to chronic renal failure treated with erythropoietin is here for follow-up. He called last week for worsening anemia and fatigue, and he received 2 units of red blood cell last week. He did feel better after blood transfusion, was more energy. He otherwise denies any other new symptoms. He is able to do his routine activities without much limitations. He denies any chest pain, dyspnea or other symptoms.   MEDICAL HISTORY: Past Medical History  Diagnosis Date  . Chronic kidney disease   . Thrombocythemia   . Esophageal stricture   . Anemia   . Anemia due to chronic renal failure treated with erythropoietin 03/24/2011  . Retinal vein thrombosis, left   . Coronary artery disease   . Hypertension   . GERD (gastroesophageal reflux disease)   . Arthritis   . Anginal pain   . Erosive esophagitis     grade 4  . Diverticulosis   . Internal hemorrhoids     ALLERGIES:  is allergic to isosorbide and norvasc.  MEDICATIONS: has a current medication list which includes the following prescription(s): acetaminophen-codeine,  acyclovir, alprazolam, anagrelide, aspirin, carboxymethylcellulose, fluticasone, furosemide, hydralazine, ketoconazole, metoprolol succinate, oxycodone, pantoprazole, proventil hfa, psyllium, tamsulosin, and triamcinolone cream, and the following Facility-Administered Medications: darbepoetin alfa.  SURGICAL HISTORY:  Past Surgical History  Procedure Laterality Date  . Replacement total knee Right 04/19/08  . Av fistula placement Left 12/01/2013    Procedure: RADIOCEPHALIC ARTERIOVENOUS (AV) FISTULA CREATION;  Surgeon: Elam Dutch, MD;  Location: [facility];  Service: Vascular;  Laterality: Left;  . Coronary artery bypass graft  2008    x 5  . Eye surgery Left yrs ago    vitrectomy  . Eye brow lift Right     01-11-2014  . Inguinal hernia repair Left 05/08/01  . Colonoscopy N/A 02/02/2014    Procedure: COLONOSCOPY;  Surgeon: Inda Castle, MD;  Location: [facility];  Service: Endoscopy;  Laterality: N/A;  . Esophagogastroduodenoscopy N/A 02/02/2014    Procedure: ESOPHAGOGASTRODUODENOSCOPY (EGD);  Surgeon: Inda Castle, MD;  Location: [facility];  Service: Endoscopy;  Laterality: N/A;  . Ligation of arteriovenous  fistula Left 07/06/2014    Procedure: LIGATION OF ARTERIOVENOUS  FISTULA;  Surgeon: Elam Dutch, MD;  Location: [facility];  Service: Vascular;  Laterality: Left;    REVIEW OF SYSTEMS:   Constitutional: Denies fevers, chills or abnormal weight loss, (+) fatigue  Eyes: Denies blurriness of vision Ears, nose, mouth, throat, and face: Denies mucositis or sore throat Respiratory: Denies cough, dyspnea or wheezes Cardiovascular: Denies palpitation, chest discomfort or lower extremity swelling Gastrointestinal:  Denies nausea, heartburn or change in bowel habits Skin: Denies abnormal skin rashes Lymphatics: Denies  new lymphadenopathy or easy bruising Neurological:Denies numbness, tingling or new weaknesses. Balance has improved a little Behavioral/Psych: Mood is stable, no new  changes  All other systems were reviewed with the patient and are negative.  PHYSICAL EXAMINATION: ECOG PERFORMANCE STATUS: 1  Blood pressure 159/99, pulse 60, temperature 97.6 F (36.4 C), temperature source Oral, resp. rate 18, height $RemoveBe'5\' 8"'tUPbXcNfk$  (1.727 m), weight 156 lb 14.4 oz (71.169 kg), SpO2 98 %.  GENERAL:alert, no distress and comfortable, elderly male; moves limbs spontaneously through out exams SKIN: skin color, texture, turgor are normal, no rashes EYES: normal, Conjunctiva are pink and non-injected, sclera clear OROPHARYNX:no exudate, no erythema and lips, buccal mucosa, and tongue normal  NECK: supple, thyroid normal size, non-tender, without nodularity LYMPH:  no palpable lymphadenopathy in the cervical, axillary or inguinal LUNGS: clear to auscultation and percussion with normal breathing effort HEART: regular rate & rhythm and no murmurs and no lower extremity edema ABDOMEN:abdomen soft, non-tender and normal bowel sounds Musculoskeletal:no peripheral edema NEURO: alert & oriented x 3 with fluent speech, no focal motor/sensory deficits; shuffling gait noted.    LABORATORY DATA: CBC Latest Ref Rng 08/23/2014 08/16/2014 08/09/2014  WBC 4.0 - 10.3 10e3/uL 4.8 4.8 5.3  Hemoglobin 13.0 - 17.1 g/dL 7.6(L) 7.7(L) 7.7(L)  Hematocrit 38.4 - 49.9 % 24.7(L) 25.2(L) 25.4(L)  Platelets 140 - 400 10e3/uL 338 292 355    CMP Latest Ref Rng 07/06/2014 03/08/2014 01/11/2014  Glucose 70 - 99 mg/dL 98 112 97  BUN 7.0 - 26.0 mg/dL - 79.0(H) 93(H)  Creatinine 0.7 - 1.3 mg/dL - 2.5(H) 2.86(H)  Sodium 135 - 145 mmol/L 140 133(L) 139  Potassium 3.5 - 5.1 mmol/L 4.6 4.3 4.9  Chloride 96 - 112 mEq/L - - 102  CO2 22 - 29 mEq/L - 24 22  Calcium 8.4 - 10.4 mg/dL - 8.6 8.7  Total Protein 6.4 - 8.3 g/dL - 5.6(L) -  Albumin 3.5 - 4.8 g/dL - - -  Total Bilirubin 0.20 - 1.20 mg/dL - 0.31 -  Alkaline Phos 40 - 150 U/L - 77 -  AST 5 - 34 U/L - 13 -  ALT 0 - 55 U/L - 14 -      RADIOGRAPHIC STUDIES: No  results found.  ASSESSMENT/PLAN:   1. Essential Thrombocythemia (09/1993).  Jak2 V617F, exon 12 and 13 mutation (-)  --His platelet count is 338K today, continue anagrelide at the same dose -Due to his worsening anemia lately, I'm concerned about his ET may have evolved to myelofibrosis or leukemia. I discussed a bone marrow biopsy, but he declines at this point because his wife does not want him to have it. But he said he will talk to his wife again.   2. Anemia secondary to chronic renal disease and iron deficiency and iron deficieny -He received 2 units of blood transfusion last week, but his hemoglobin remains to be at 7.6 today, no improvement compared to last week before blood transfusion He has refused bone marrow biopsy multiple times due to his age and his wife's concern - Hold Aranesp injection today due to hypertension (he did not take his BP meds), and resume next week and continue every 2 weeks  - he does not want blood transfusion today. I'll set up for next week if hemoglobin less than 7.5   3. Shuffling gait, episodic movements. --He will follow up with his neurology. He is on sinemet. He was again counseled on fall precautions.   4. CKD -Stable, follow-up with nephrology. -  He has a AV fistula in his left forearm, is scheduled to have some revision next week due to his hand numbness.  All questions were answered. The patient knows to call the clinic with any problems, questions or concerns. We can certainly see the patient much sooner if necessary.  I spent 20 minutes counseling the patient face to face. The total time spent in the appointment was 25 minutes.    Plan: -Hold aranesp today and resume next week q2w X4 -Return to clinic in 3 weeks for follow-up -blood transfusion if Hb<7.5 or symptomatic, I'll set up blood transfusion for him next week  Truitt Merle 08/23/2014

## 2014-08-24 ENCOUNTER — Ambulatory Visit: Payer: Medicare Other | Admitting: Hematology

## 2014-08-24 ENCOUNTER — Telehealth: Payer: Self-pay | Admitting: *Deleted

## 2014-08-24 NOTE — Telephone Encounter (Signed)
Per staff message and POF I have scheduled appts. Advised scheduler of appts. JMW  

## 2014-08-25 ENCOUNTER — Telehealth: Payer: Self-pay | Admitting: Gastroenterology

## 2014-08-25 ENCOUNTER — Other Ambulatory Visit: Payer: Self-pay

## 2014-08-25 DIAGNOSIS — D62 Acute posthemorrhagic anemia: Secondary | ICD-10-CM

## 2014-08-25 NOTE — Telephone Encounter (Signed)
[place] for [employer]

## 2014-08-25 NOTE — Telephone Encounter (Signed)
Patient contacted. He wants to pick up the cards. Order in. Cards at the front desk for pick up.

## 2014-08-30 ENCOUNTER — Other Ambulatory Visit (HOSPITAL_BASED_OUTPATIENT_CLINIC_OR_DEPARTMENT_OTHER): Payer: Medicare Other

## 2014-08-30 ENCOUNTER — Ambulatory Visit (HOSPITAL_BASED_OUTPATIENT_CLINIC_OR_DEPARTMENT_OTHER): Payer: Medicare Other

## 2014-08-30 VITALS — BP 133/82 | HR 53 | Temp 98.9°F | Resp 18

## 2014-08-30 DIAGNOSIS — N183 Chronic kidney disease, stage 3 (moderate): Secondary | ICD-10-CM

## 2014-08-30 DIAGNOSIS — D631 Anemia in chronic kidney disease: Secondary | ICD-10-CM

## 2014-08-30 DIAGNOSIS — D508 Other iron deficiency anemias: Secondary | ICD-10-CM | POA: Diagnosis not present

## 2014-08-30 DIAGNOSIS — D539 Nutritional anemia, unspecified: Secondary | ICD-10-CM

## 2014-08-30 DIAGNOSIS — N189 Chronic kidney disease, unspecified: Secondary | ICD-10-CM

## 2014-08-30 DIAGNOSIS — R0609 Other forms of dyspnea: Secondary | ICD-10-CM

## 2014-08-30 DIAGNOSIS — D509 Iron deficiency anemia, unspecified: Secondary | ICD-10-CM

## 2014-08-30 DIAGNOSIS — D473 Essential (hemorrhagic) thrombocythemia: Secondary | ICD-10-CM

## 2014-08-30 LAB — IRON AND TIBC CHCC
%SAT: 14 % — AB (ref 20–55)
Iron: 42 ug/dL (ref 42–163)
TIBC: 293 ug/dL (ref 202–409)
UIBC: 251 ug/dL (ref 117–376)

## 2014-08-30 LAB — CBC WITH DIFFERENTIAL/PLATELET
BASO%: 0.4 % (ref 0.0–2.0)
BASOS ABS: 0 10*3/uL (ref 0.0–0.1)
EOS%: 3.2 % (ref 0.0–7.0)
Eosinophils Absolute: 0.2 10*3/uL (ref 0.0–0.5)
HCT: 19.7 % — ABNORMAL LOW (ref 38.4–49.9)
HGB: 6.1 g/dL — CL (ref 13.0–17.1)
LYMPH%: 14.3 % (ref 14.0–49.0)
MCH: 29.9 pg (ref 27.2–33.4)
MCHC: 31 g/dL — ABNORMAL LOW (ref 32.0–36.0)
MCV: 96.6 fL (ref 79.3–98.0)
MONO#: 0.3 10*3/uL (ref 0.1–0.9)
MONO%: 7.2 % (ref 0.0–14.0)
NEUT#: 3.5 10*3/uL (ref 1.5–6.5)
NEUT%: 74.9 % (ref 39.0–75.0)
PLATELETS: 268 10*3/uL (ref 140–400)
RBC: 2.04 10*6/uL — ABNORMAL LOW (ref 4.20–5.82)
RDW: 17.9 % — AB (ref 11.0–14.6)
WBC: 4.7 10*3/uL (ref 4.0–10.3)
lymph#: 0.7 10*3/uL — ABNORMAL LOW (ref 0.9–3.3)
nRBC: 0 % (ref 0–0)

## 2014-08-30 LAB — PREPARE RBC (CROSSMATCH)

## 2014-08-30 LAB — FERRITIN CHCC: FERRITIN: 26 ng/mL (ref 22–316)

## 2014-08-30 LAB — HOLD TUBE, BLOOD BANK

## 2014-08-30 LAB — LACTATE DEHYDROGENASE (CC13): LDH: 154 U/L (ref 125–245)

## 2014-08-30 MED ORDER — DARBEPOETIN ALFA 300 MCG/0.6ML IJ SOSY
300.0000 ug | PREFILLED_SYRINGE | Freq: Once | INTRAMUSCULAR | Status: AC
  Administered 2014-08-30: 300 ug via SUBCUTANEOUS
  Filled 2014-08-30: qty 0.6

## 2014-08-30 MED ORDER — ACETAMINOPHEN 325 MG PO TABS
ORAL_TABLET | ORAL | Status: AC
  Filled 2014-08-30: qty 2

## 2014-08-30 MED ORDER — DIPHENHYDRAMINE HCL 25 MG PO CAPS
25.0000 mg | ORAL_CAPSULE | Freq: Once | ORAL | Status: AC
  Administered 2014-08-30: 25 mg via ORAL

## 2014-08-30 MED ORDER — ACETAMINOPHEN 325 MG PO TABS
650.0000 mg | ORAL_TABLET | Freq: Once | ORAL | Status: AC
  Administered 2014-08-30: 650 mg via ORAL

## 2014-08-30 MED ORDER — DIPHENHYDRAMINE HCL 25 MG PO CAPS
ORAL_CAPSULE | ORAL | Status: AC
  Filled 2014-08-30: qty 1

## 2014-08-30 MED ORDER — SODIUM CHLORIDE 0.9 % IV SOLN
250.0000 mL | Freq: Once | INTRAVENOUS | Status: AC
  Administered 2014-08-30: 250 mL via INTRAVENOUS

## 2014-08-30 NOTE — Patient Instructions (Signed)

## 2014-08-30 NOTE — Progress Notes (Signed)
Hgb-6.1 today. Dr. Burr Medico is off. Per Selena Lesser NP, okay to tx 2 units PRBCs with usual pre-meds of tyl 650 po and ben 50 po; witnessed by Hilario Quarry RN. Pt reminded to take his lasix this PM; voices understanding.

## 2014-08-31 ENCOUNTER — Other Ambulatory Visit (INDEPENDENT_AMBULATORY_CARE_PROVIDER_SITE_OTHER): Payer: Medicare Other

## 2014-08-31 DIAGNOSIS — D62 Acute posthemorrhagic anemia: Secondary | ICD-10-CM

## 2014-08-31 LAB — HEMOCCULT SLIDES (X 3 CARDS)
FECAL OCCULT BLD: POSITIVE — AB
OCCULT 1: POSITIVE — AB
OCCULT 2: POSITIVE — AB
OCCULT 3: POSITIVE — AB
OCCULT 4: POSITIVE — AB
OCCULT 5: POSITIVE — AB

## 2014-08-31 LAB — TYPE AND SCREEN
ABO/RH(D): A POS
Antibody Screen: NEGATIVE
Unit division: 0
Unit division: 0

## 2014-08-31 LAB — FOLATE RBC: RBC FOLATE: 1680 ng/mL (ref >280)

## 2014-08-31 LAB — HAPTOGLOBIN: HAPTOGLOBIN: 159 mg/dL (ref 43–212)

## 2014-09-01 ENCOUNTER — Telehealth: Payer: Self-pay | Admitting: Gastroenterology

## 2014-09-01 NOTE — Telephone Encounter (Signed)
See results note. 

## 2014-09-01 NOTE — Telephone Encounter (Signed)
Please review

## 2014-09-06 ENCOUNTER — Telehealth: Payer: Self-pay | Admitting: Gastroenterology

## 2014-09-06 NOTE — Telephone Encounter (Signed)
Patient and spouse needed clarification about the instructions for his capsule study. Questions invited and answered.

## 2014-09-09 ENCOUNTER — Ambulatory Visit (INDEPENDENT_AMBULATORY_CARE_PROVIDER_SITE_OTHER): Payer: Medicare Other | Admitting: Internal Medicine

## 2014-09-09 DIAGNOSIS — D509 Iron deficiency anemia, unspecified: Secondary | ICD-10-CM

## 2014-09-09 NOTE — Progress Notes (Signed)
Patient here for capsule endoscopy. Tolerated procedure. Verbalizes understanding of written and verbal instructions. Lot#  [id number] Exp:  11/2015

## 2014-09-13 ENCOUNTER — Encounter: Payer: Self-pay | Admitting: Hematology

## 2014-09-13 ENCOUNTER — Ambulatory Visit (HOSPITAL_BASED_OUTPATIENT_CLINIC_OR_DEPARTMENT_OTHER): Payer: Medicare Other | Admitting: Hematology

## 2014-09-13 ENCOUNTER — Other Ambulatory Visit (HOSPITAL_BASED_OUTPATIENT_CLINIC_OR_DEPARTMENT_OTHER): Payer: Medicare Other

## 2014-09-13 ENCOUNTER — Telehealth: Payer: Self-pay | Admitting: Hematology

## 2014-09-13 ENCOUNTER — Ambulatory Visit (HOSPITAL_BASED_OUTPATIENT_CLINIC_OR_DEPARTMENT_OTHER): Payer: Medicare Other

## 2014-09-13 VITALS — BP 165/60 | HR 61 | Temp 97.7°F | Resp 18 | Ht 68.0 in | Wt 161.9 lb

## 2014-09-13 DIAGNOSIS — D473 Essential (hemorrhagic) thrombocythemia: Secondary | ICD-10-CM

## 2014-09-13 DIAGNOSIS — R269 Unspecified abnormalities of gait and mobility: Secondary | ICD-10-CM

## 2014-09-13 DIAGNOSIS — N189 Chronic kidney disease, unspecified: Secondary | ICD-10-CM | POA: Diagnosis not present

## 2014-09-13 DIAGNOSIS — D631 Anemia in chronic kidney disease: Secondary | ICD-10-CM

## 2014-09-13 DIAGNOSIS — I10 Essential (primary) hypertension: Secondary | ICD-10-CM

## 2014-09-13 DIAGNOSIS — D649 Anemia, unspecified: Secondary | ICD-10-CM

## 2014-09-13 DIAGNOSIS — N183 Chronic kidney disease, stage 3 (moderate): Principal | ICD-10-CM

## 2014-09-13 DIAGNOSIS — D509 Iron deficiency anemia, unspecified: Secondary | ICD-10-CM | POA: Diagnosis not present

## 2014-09-13 LAB — CBC WITH DIFFERENTIAL/PLATELET
BASO%: 1 % (ref 0.0–2.0)
Basophils Absolute: 0 10*3/uL (ref 0.0–0.1)
EOS%: 4.4 % (ref 0.0–7.0)
Eosinophils Absolute: 0.2 10*3/uL (ref 0.0–0.5)
HCT: 25.9 % — ABNORMAL LOW (ref 38.4–49.9)
HGB: 8.2 g/dL — ABNORMAL LOW (ref 13.0–17.1)
LYMPH#: 0.8 10*3/uL — AB (ref 0.9–3.3)
LYMPH%: 16.8 % (ref 14.0–49.0)
MCH: 28.4 pg (ref 27.2–33.4)
MCHC: 31.6 g/dL — AB (ref 32.0–36.0)
MCV: 89.7 fL (ref 79.3–98.0)
MONO#: 0.5 10*3/uL (ref 0.1–0.9)
MONO%: 10.2 % (ref 0.0–14.0)
NEUT#: 3.4 10*3/uL (ref 1.5–6.5)
NEUT%: 67.6 % (ref 39.0–75.0)
Platelets: 287 10*3/uL (ref 140–400)
RBC: 2.88 10*6/uL — AB (ref 4.20–5.82)
RDW: 17.4 % — ABNORMAL HIGH (ref 11.0–14.6)
WBC: 5 10*3/uL (ref 4.0–10.3)

## 2014-09-13 LAB — HOLD TUBE, BLOOD BANK

## 2014-09-13 MED ORDER — DARBEPOETIN ALFA 300 MCG/0.6ML IJ SOSY
300.0000 ug | PREFILLED_SYRINGE | Freq: Once | INTRAMUSCULAR | Status: AC
  Administered 2014-09-13: 300 ug via SUBCUTANEOUS
  Filled 2014-09-13: qty 0.6

## 2014-09-13 NOTE — Telephone Encounter (Signed)
Gave patient avs report and appointments for April and May. Per 4/11 pof patient transferring to BS. Per YF she has spoken with BS and he will take care of patient.

## 2014-09-13 NOTE — Progress Notes (Addendum)
[facility] OFFICE PROGRESS NOTE   [name], MD [address] [place] [address]  DIAGNOSIS: Essential thrombocythemia  Anemia, unspecified anemia type  CURRENT AND PREVIOUS THERAPY:  1. Aspirin 81 mg by mouth daily.  2. Anagrelide (Agrylin) 1 mg by mouth daily, stopped on 07/03/2014, resumed on 07/27/2014.  3. Aranesp 300 mcg subcu every 2 weeks for hemoglobin less than 11.  4. Blood transfusion for symptomatic anemia, hemoglobin less than 8  INTERVAL HISTORY:  [name] 81 y.o. male history of essential thrombocythemia which was diagnosed in 09/1993 and anemia due to chronic renal failure treated with erythropoietin is here for follow-up. He underwent capsule endoscopy by Dr. Deatra Ina, and his results still pending. He denies any overt GI bleeding. He received 2 units of blood transfusion 2 weeks ago, felt better, mild fatigue, able to function relatively well at home. No other new complaints.   MEDICAL HISTORY: Past Medical History  Diagnosis Date  . Chronic kidney disease   . Thrombocythemia   . Esophageal stricture   . Anemia   . Anemia due to chronic renal failure treated with erythropoietin 03/24/2011  . Retinal vein thrombosis, left   . Coronary artery disease   . Hypertension   . GERD (gastroesophageal reflux disease)   . Arthritis   . Anginal pain   . Erosive esophagitis     grade 4  . Diverticulosis   . Internal hemorrhoids     ALLERGIES:  is allergic to isosorbide and norvasc.  MEDICATIONS: has a current medication list which includes the following prescription(s): acetaminophen-codeine, acyclovir, alprazolam, anagrelide, aspirin, carboxymethylcellulose, fluticasone, furosemide, hydralazine, ketoconazole, metoprolol succinate, oxycodone, pantoprazole, proventil hfa, psyllium, tamsulosin, and triamcinolone cream.  SURGICAL HISTORY:  Past Surgical History  Procedure Laterality Date  . Replacement total knee Right 04/19/08   . Av fistula placement Left 12/01/2013    Procedure: RADIOCEPHALIC ARTERIOVENOUS (AV) FISTULA CREATION;  Surgeon: Elam Dutch, MD;  Location: [facility];  Service: Vascular;  Laterality: Left;  . Coronary artery bypass graft  2008    x 5  . Eye surgery Left yrs ago    vitrectomy  . Eye brow lift Right     01-11-2014  . Inguinal hernia repair Left 05/08/01  . Colonoscopy N/A 02/02/2014    Procedure: COLONOSCOPY;  Surgeon: Inda Castle, MD;  Location: [facility];  Service: Endoscopy;  Laterality: N/A;  . Esophagogastroduodenoscopy N/A 02/02/2014    Procedure: ESOPHAGOGASTRODUODENOSCOPY (EGD);  Surgeon: Inda Castle, MD;  Location: [facility];  Service: Endoscopy;  Laterality: N/A;  . Ligation of arteriovenous  fistula Left 07/06/2014    Procedure: LIGATION OF ARTERIOVENOUS  FISTULA;  Surgeon: Elam Dutch, MD;  Location: [facility];  Service: Vascular;  Laterality: Left;    REVIEW OF SYSTEMS:   Constitutional: Denies fevers, chills or abnormal weight loss, (+) fatigue  Eyes: Denies blurriness of vision Ears, nose, mouth, throat, and face: Denies mucositis or sore throat Respiratory: Denies cough, dyspnea or wheezes Cardiovascular: Denies palpitation, chest discomfort or lower extremity swelling Gastrointestinal:  Denies nausea, heartburn or change in bowel habits Skin: Denies abnormal skin rashes Lymphatics: Denies new lymphadenopathy or easy bruising Neurological:Denies numbness, tingling or new weaknesses. Balance has improved a little Behavioral/Psych: Mood is stable, no new changes  All other systems were reviewed with the patient and are negative.  PHYSICAL EXAMINATION: ECOG PERFORMANCE STATUS: 1  Blood pressure 165/60, pulse 61, temperature 97.7 F (36.5 C), temperature source Oral, resp. rate 18,  height '5\' 8"'$  (1.727 m), weight 161 lb 14.4 oz (73.437 kg), SpO2 96 %.  GENERAL:alert, no distress and comfortable, elderly male; moves limbs spontaneously through out exams SKIN:  skin color, texture, turgor are normal, no rashes EYES: normal, Conjunctiva are pink and non-injected, sclera clear OROPHARYNX:no exudate, no erythema and lips, buccal mucosa, and tongue normal  NECK: supple, thyroid normal size, non-tender, without nodularity LYMPH:  no palpable lymphadenopathy in the cervical, axillary or inguinal LUNGS: clear to auscultation and percussion with normal breathing effort HEART: regular rate & rhythm and no murmurs and no lower extremity edema ABDOMEN:abdomen soft, non-tender and normal bowel sounds Musculoskeletal:no peripheral edema NEURO: alert & oriented x 3 with fluent speech, no focal motor/sensory deficits; shuffling gait noted.    LABORATORY DATA: CBC Latest Ref Rng 09/13/2014 08/30/2014 08/23/2014  WBC 4.0 - 10.3 10e3/uL 5.0 4.7 4.8  Hemoglobin 13.0 - 17.1 g/dL 8.2(L) 6.1(LL) 7.6(L)  Hematocrit 38.4 - 49.9 % 25.9(L) 19.7(L) 24.7(L)  Platelets 140 - 400 10e3/uL 287 268 338    CMP Latest Ref Rng 07/06/2014 03/08/2014 01/11/2014  Glucose 70 - 99 mg/dL 98 112 97  BUN 7.0 - 26.0 mg/dL - 79.0(H) 93(H)  Creatinine 0.7 - 1.3 mg/dL - 2.5(H) 2.86(H)  Sodium 135 - 145 mmol/L 140 133(L) 139  Potassium 3.5 - 5.1 mmol/L 4.6 4.3 4.9  Chloride 96 - 112 mEq/L - - 102  CO2 22 - 29 mEq/L - 24 22  Calcium 8.4 - 10.4 mg/dL - 8.6 8.7  Total Protein 6.4 - 8.3 g/dL - 5.6(L) -  Albumin 3.5 - 4.8 g/dL - - -  Total Bilirubin 0.20 - 1.20 mg/dL - 0.31 -  Alkaline Phos 40 - 150 U/L - 77 -  AST 5 - 34 U/L - 13 -  ALT 0 - 55 U/L - 14 -      RADIOGRAPHIC STUDIES: No results found.  ASSESSMENT/PLAN:   1. Essential Thrombocythemia (09/1993).  Jak2 V617F, exon 12 and 13 mutation (-)  --His platelet count is 287K today, continue anagrelide at the same dose -Due to his worsening anemia lately, I'm concerned about his ET may have evolved to myelofibrosis or leukemia. Bone marrow biopsy was recommended and the discussed with him several times, but he declines at this point  because of his advanced age and his wife does not want him to have it.    2. Anemia secondary to chronic renal disease and iron deficiency  -He has been received Aranesp injection every 2 weeks, and blood transfusion intermittently, average every a few months. - he does not want blood transfusion today.  -We'll consider blood transfusion if his hemoglobin is below 8.0 or symptomatic. Check a CBC every 2 weeks -his last Ferritin on 08/30/2014 was 26, this is certainly low for him giving his CKD. I will arrange feraheme infusion twice this month.  -He recently had GI work up with Dr. Deatra Ina  -check iron study every 1-2 month   3. Shuffling gait, episodic movements. --He will follow up with his neurology. He is on sinemet. He was again counseled on fall precautions.   4. HTN -His blood pressure has not been well-controlled lately. -I asked him to follow-up with his primary care physician.  5. CKD -Stable, follow-up with nephrology. -He has a AV fistula in his left forearm, is scheduled to have some revision next week due to his hand numbness.  All questions were answered. The patient knows to call the clinic with any problems, questions or  concerns. We can certainly see the patient much sooner if necessary.  I spent 20 minutes counseling the patient face to face. The total time spent in the appointment was 25 minutes.    Plan: -Continue Aranesp every 2 weeks -Lab CBC every 2 weeks -He would like to switch his provider. I'll refer him to see Dr. Learta Codding in 4-6 weeks   Truitt Merle 09/13/2014

## 2014-09-13 NOTE — Progress Notes (Signed)
Dr. Deatra Ina patient Will forward to him for completion and billing

## 2014-09-15 ENCOUNTER — Telehealth: Payer: Self-pay | Admitting: Gastroenterology

## 2014-09-15 NOTE — Telephone Encounter (Signed)
Have you reviewed the study?

## 2014-09-16 NOTE — Telephone Encounter (Signed)
I have left message for the patient to call back 

## 2014-09-16 NOTE — Addendum Note (Signed)
Addended by: Marlon Pel on: 09/16/2014 02:39 PM   Modules accepted: Level of Service

## 2014-09-16 NOTE — Telephone Encounter (Signed)
Spoke with the patient and his wife. They are aware of the results of "abnormal blood vessels" and are greatly relieved. Agree to come in to discuss on 09/22/14 at 10:30 am. Also reported his last CBC was improved.

## 2014-09-16 NOTE — Telephone Encounter (Signed)
Capsule study demonstrates some abnormal blood vessels in the small intestine which is a common finding and very likely the source for his bleeding.  He should return for an office visit.

## 2014-09-20 ENCOUNTER — Encounter: Payer: Self-pay | Admitting: Gastroenterology

## 2014-09-22 ENCOUNTER — Ambulatory Visit (INDEPENDENT_AMBULATORY_CARE_PROVIDER_SITE_OTHER): Payer: Medicare Other | Admitting: Gastroenterology

## 2014-09-22 ENCOUNTER — Ambulatory Visit (INDEPENDENT_AMBULATORY_CARE_PROVIDER_SITE_OTHER)
Admission: RE | Admit: 2014-09-22 | Discharge: 2014-09-22 | Disposition: A | Discharge status: Home / Self Care | Payer: Medicare Other | Source: Ambulatory Visit | Attending: Gastroenterology | Admitting: Gastroenterology

## 2014-09-22 ENCOUNTER — Encounter: Payer: Self-pay | Admitting: Gastroenterology

## 2014-09-22 VITALS — BP 142/60 | HR 64 | Ht 68.0 in | Wt 162.0 lb

## 2014-09-22 DIAGNOSIS — K5520 Angiodysplasia of colon without hemorrhage: Secondary | ICD-10-CM

## 2014-09-22 DIAGNOSIS — D500 Iron deficiency anemia secondary to blood loss (chronic): Secondary | ICD-10-CM

## 2014-09-22 DIAGNOSIS — Q2733 Arteriovenous malformation of digestive system vessel: Secondary | ICD-10-CM

## 2014-09-22 DIAGNOSIS — I255 Ischemic cardiomyopathy: Secondary | ICD-10-CM | POA: Diagnosis not present

## 2014-09-22 MED ORDER — FERROUS SULFATE 325 (65 FE) MG PO TBEC: 325.0000 mg | DELAYED_RELEASE_TABLET | Freq: Three times a day (TID) | ORAL | Status: DC

## 2014-09-22 NOTE — Patient Instructions (Signed)
Go to the basement today for your Xray Cc. Scott Holwerda,MD

## 2014-09-22 NOTE — Progress Notes (Signed)
      History of Present Illness:  [name] underwent capsule endoscopy that demonstrated AVMs.  He takes Agrylin but stopped aspirin.  He noted normalization of the stool color and most recent hemoglobin was 8.2.  He does not think he passed the capsule    Review of Systems: Pertinent positive and negative review of systems were noted in the above HPI section. All other review of systems were otherwise negative.    Current Medications, Allergies, Past Medical History, Past Surgical History, Family History and Social History were reviewed in [employer] record  Vital signs were reviewed in today's medical record. Physical Exam: General: Well developed , well nourished, no acute distress   See Assessment and Plan under Problem List

## 2014-09-22 NOTE — Assessment & Plan Note (Signed)
Anemia is multifactorial including chronic GI blood loss from AVMs.  Plan iron supplementation

## 2014-09-22 NOTE — Assessment & Plan Note (Signed)
GI blood loss is very likely due to AVMs.  Bleeding is exacerbated by antiplatelet therapy.  Plan to place him permanently on oral iron supplementation.  Provided that he can maintain his blood counts oh further therapy is required.

## 2014-09-27 ENCOUNTER — Ambulatory Visit (HOSPITAL_COMMUNITY)
Admission: RE | Admit: 2014-09-27 | Discharge: 2014-09-27 | Disposition: A | Discharge status: Home / Self Care | Payer: Medicare Other | Source: Ambulatory Visit | Attending: Internal Medicine | Admitting: Internal Medicine

## 2014-09-27 ENCOUNTER — Other Ambulatory Visit: Payer: Self-pay | Admitting: *Deleted

## 2014-09-27 ENCOUNTER — Other Ambulatory Visit (HOSPITAL_BASED_OUTPATIENT_CLINIC_OR_DEPARTMENT_OTHER): Payer: Medicare Other

## 2014-09-27 ENCOUNTER — Other Ambulatory Visit: Payer: Self-pay | Admitting: Hematology

## 2014-09-27 ENCOUNTER — Ambulatory Visit (HOSPITAL_BASED_OUTPATIENT_CLINIC_OR_DEPARTMENT_OTHER): Payer: Medicare Other

## 2014-09-27 VITALS — BP 135/58 | HR 62 | Temp 97.7°F | Resp 26

## 2014-09-27 DIAGNOSIS — D508 Other iron deficiency anemias: Secondary | ICD-10-CM | POA: Insufficient documentation

## 2014-09-27 DIAGNOSIS — D631 Anemia in chronic kidney disease: Secondary | ICD-10-CM | POA: Diagnosis not present

## 2014-09-27 DIAGNOSIS — D473 Essential (hemorrhagic) thrombocythemia: Secondary | ICD-10-CM

## 2014-09-27 DIAGNOSIS — N189 Chronic kidney disease, unspecified: Secondary | ICD-10-CM

## 2014-09-27 DIAGNOSIS — D649 Anemia, unspecified: Secondary | ICD-10-CM

## 2014-09-27 DIAGNOSIS — N183 Chronic kidney disease, stage 3 unspecified: Secondary | ICD-10-CM

## 2014-09-27 LAB — CBC WITH DIFFERENTIAL/PLATELET
BASO%: 0.4 % (ref 0.0–2.0)
Basophils Absolute: 0 10*3/uL (ref 0.0–0.1)
EOS%: 3.7 % (ref 0.0–7.0)
Eosinophils Absolute: 0.2 10*3/uL (ref 0.0–0.5)
HEMATOCRIT: 23.6 % — AB (ref 38.4–49.9)
HGB: 7.2 g/dL — ABNORMAL LOW (ref 13.0–17.1)
LYMPH%: 16.4 % (ref 14.0–49.0)
MCH: 27.8 pg (ref 27.2–33.4)
MCHC: 30.5 g/dL — ABNORMAL LOW (ref 32.0–36.0)
MCV: 91.1 fL (ref 79.3–98.0)
MONO#: 0.4 10*3/uL (ref 0.1–0.9)
MONO%: 8.1 % (ref 0.0–14.0)
NEUT#: 3.4 10*3/uL (ref 1.5–6.5)
NEUT%: 71.4 % (ref 39.0–75.0)
Platelets: 289 10*3/uL (ref 140–400)
RBC: 2.59 10*6/uL — ABNORMAL LOW (ref 4.20–5.82)
RDW: 16.1 % — ABNORMAL HIGH (ref 11.0–14.6)
WBC: 4.8 10*3/uL (ref 4.0–10.3)
lymph#: 0.8 10*3/uL — ABNORMAL LOW (ref 0.9–3.3)

## 2014-09-27 LAB — HOLD TUBE, BLOOD BANK

## 2014-09-27 LAB — COMPREHENSIVE METABOLIC PANEL (CC13)
ALK PHOS: 82 U/L (ref 40–150)
ALT: 12 U/L (ref 0–55)
AST: 11 U/L (ref 5–34)
Albumin: 3 g/dL — ABNORMAL LOW (ref 3.5–5.0)
Anion Gap: 15 mEq/L — ABNORMAL HIGH (ref 3–11)
BILIRUBIN TOTAL: 0.24 mg/dL (ref 0.20–1.20)
BUN: 80.7 mg/dL — ABNORMAL HIGH (ref 7.0–26.0)
CALCIUM: 8 mg/dL — AB (ref 8.4–10.4)
CO2: 16 mEq/L — ABNORMAL LOW (ref 22–29)
Chloride: 103 mEq/L (ref 98–109)
Creatinine: 2.8 mg/dL — ABNORMAL HIGH (ref 0.7–1.3)
EGFR: 20 mL/min/{1.73_m2} — AB (ref >90)
Glucose: 129 mg/dl (ref 70–140)
Potassium: 5.6 mEq/L — ABNORMAL HIGH (ref 3.5–5.1)
Sodium: 134 mEq/L — ABNORMAL LOW (ref 136–145)
TOTAL PROTEIN: 5.6 g/dL — AB (ref 6.4–8.3)

## 2014-09-27 MED ORDER — DARBEPOETIN ALFA 300 MCG/0.6ML IJ SOSY
300.0000 ug | PREFILLED_SYRINGE | Freq: Once | INTRAMUSCULAR | Status: AC
  Administered 2014-09-27: 300 ug via SUBCUTANEOUS
  Filled 2014-09-27: qty 0.6

## 2014-09-27 NOTE — Progress Notes (Signed)
Patient asked how low his blood was.  He has noted more SOB with activity and legs being weaker.  Does feel like he needs a blood transfusion.  He will wait for a call from [place] about when to come in for it.

## 2014-09-27 NOTE — Addendum Note (Signed)
Addended by: Truitt Merle on: 09/27/2014 05:10 PM   Modules accepted: Orders

## 2014-09-28 ENCOUNTER — Telehealth: Payer: Self-pay | Admitting: Oncology

## 2014-09-28 DIAGNOSIS — D508 Other iron deficiency anemias: Secondary | ICD-10-CM | POA: Diagnosis not present

## 2014-09-28 NOTE — Telephone Encounter (Signed)
s.w. pt and advised on added appts 4.27.16...Marland Kitchenpt ok and aware

## 2014-09-29 ENCOUNTER — Ambulatory Visit (HOSPITAL_BASED_OUTPATIENT_CLINIC_OR_DEPARTMENT_OTHER): Payer: Medicare Other

## 2014-09-29 ENCOUNTER — Other Ambulatory Visit: Payer: Medicare Other

## 2014-09-29 VITALS — BP 154/56 | HR 56 | Temp 97.8°F | Resp 20

## 2014-09-29 DIAGNOSIS — D509 Iron deficiency anemia, unspecified: Secondary | ICD-10-CM | POA: Diagnosis not present

## 2014-09-29 DIAGNOSIS — N189 Chronic kidney disease, unspecified: Secondary | ICD-10-CM

## 2014-09-29 DIAGNOSIS — D631 Anemia in chronic kidney disease: Secondary | ICD-10-CM | POA: Diagnosis not present

## 2014-09-29 DIAGNOSIS — D508 Other iron deficiency anemias: Secondary | ICD-10-CM | POA: Diagnosis not present

## 2014-09-29 DIAGNOSIS — D649 Anemia, unspecified: Secondary | ICD-10-CM

## 2014-09-29 MED ORDER — HEPARIN SOD (PORK) LOCK FLUSH 100 UNIT/ML IV SOLN
500.0000 [IU] | Freq: Every day | INTRAVENOUS | Status: DC | PRN
  Filled 2014-09-29: qty 5

## 2014-09-29 MED ORDER — ACETAMINOPHEN 325 MG PO TABS
650.0000 mg | ORAL_TABLET | Freq: Once | ORAL | Status: AC
  Administered 2014-09-29: 650 mg via ORAL

## 2014-09-29 MED ORDER — FUROSEMIDE 10 MG/ML IJ SOLN
20.0000 mg | Freq: Once | INTRAMUSCULAR | Status: AC
  Administered 2014-09-29: 20 mg via INTRAVENOUS

## 2014-09-29 MED ORDER — DIPHENHYDRAMINE HCL 25 MG PO CAPS
25.0000 mg | ORAL_CAPSULE | Freq: Once | ORAL | Status: AC
  Administered 2014-09-29: 25 mg via ORAL

## 2014-09-29 MED ORDER — DIPHENHYDRAMINE HCL 25 MG PO CAPS
ORAL_CAPSULE | ORAL | Status: AC
  Filled 2014-09-29: qty 1

## 2014-09-29 MED ORDER — SODIUM CHLORIDE 0.9 % IV SOLN
250.0000 mL | Freq: Once | INTRAVENOUS | Status: AC
  Administered 2014-09-29: 250 mL via INTRAVENOUS

## 2014-09-29 MED ORDER — ACETAMINOPHEN 325 MG PO TABS
ORAL_TABLET | ORAL | Status: AC
  Filled 2014-09-29: qty 2

## 2014-09-29 NOTE — Patient Instructions (Signed)

## 2014-09-30 LAB — TYPE AND SCREEN
ABO/RH(D): A POS
ANTIBODY SCREEN: NEGATIVE
Unit division: 0
Unit division: 0

## 2014-10-04 ENCOUNTER — Other Ambulatory Visit: Payer: Self-pay | Admitting: Hematology

## 2014-10-05 ENCOUNTER — Other Ambulatory Visit: Payer: Self-pay

## 2014-10-05 DIAGNOSIS — D473 Essential (hemorrhagic) thrombocythemia: Secondary | ICD-10-CM

## 2014-10-05 MED ORDER — ANAGRELIDE HCL 1 MG PO CAPS: ORAL_CAPSULE | ORAL | Status: DC

## 2014-10-11 ENCOUNTER — Ambulatory Visit (HOSPITAL_BASED_OUTPATIENT_CLINIC_OR_DEPARTMENT_OTHER): Payer: Medicare Other

## 2014-10-11 ENCOUNTER — Ambulatory Visit: Payer: Medicare Other

## 2014-10-11 ENCOUNTER — Other Ambulatory Visit (HOSPITAL_BASED_OUTPATIENT_CLINIC_OR_DEPARTMENT_OTHER): Payer: Medicare Other

## 2014-10-11 VITALS — BP 130/52 | HR 57 | Temp 98.3°F

## 2014-10-11 VITALS — BP 151/52 | HR 51 | Temp 98.1°F | Resp 18

## 2014-10-11 DIAGNOSIS — D631 Anemia in chronic kidney disease: Secondary | ICD-10-CM

## 2014-10-11 DIAGNOSIS — N189 Chronic kidney disease, unspecified: Secondary | ICD-10-CM | POA: Diagnosis present

## 2014-10-11 DIAGNOSIS — D509 Iron deficiency anemia, unspecified: Secondary | ICD-10-CM

## 2014-10-11 DIAGNOSIS — N183 Chronic kidney disease, stage 3 (moderate): Secondary | ICD-10-CM

## 2014-10-11 DIAGNOSIS — D473 Essential (hemorrhagic) thrombocythemia: Secondary | ICD-10-CM

## 2014-10-11 DIAGNOSIS — D649 Anemia, unspecified: Secondary | ICD-10-CM

## 2014-10-11 DIAGNOSIS — N181 Chronic kidney disease, stage 1: Principal | ICD-10-CM

## 2014-10-11 LAB — CBC WITH DIFFERENTIAL/PLATELET
BASO%: 1.4 % (ref 0.0–2.0)
BASOS ABS: 0.1 10*3/uL (ref 0.0–0.1)
EOS ABS: 0.3 10*3/uL (ref 0.0–0.5)
EOS%: 5.5 % (ref 0.0–7.0)
HEMATOCRIT: 26.3 % — AB (ref 38.4–49.9)
HEMOGLOBIN: 8.4 g/dL — AB (ref 13.0–17.1)
LYMPH%: 16.6 % (ref 14.0–49.0)
MCH: 28.4 pg (ref 27.2–33.4)
MCHC: 32 g/dL (ref 32.0–36.0)
MCV: 88.9 fL (ref 79.3–98.0)
MONO#: 0.5 10*3/uL (ref 0.1–0.9)
MONO%: 10.2 % (ref 0.0–14.0)
NEUT#: 3.3 10*3/uL (ref 1.5–6.5)
NEUT%: 66.3 % (ref 39.0–75.0)
Platelets: 331 10*3/uL (ref 140–400)
RBC: 2.96 10*6/uL — ABNORMAL LOW (ref 4.20–5.82)
RDW: 17.8 % — ABNORMAL HIGH (ref 11.0–14.6)
WBC: 5 10*3/uL (ref 4.0–10.3)
lymph#: 0.8 10*3/uL — ABNORMAL LOW (ref 0.9–3.3)

## 2014-10-11 LAB — HOLD TUBE, BLOOD BANK

## 2014-10-11 MED ORDER — DIPHENHYDRAMINE HCL 25 MG PO CAPS
ORAL_CAPSULE | ORAL | Status: AC
  Filled 2014-10-11: qty 1

## 2014-10-11 MED ORDER — DARBEPOETIN ALFA 300 MCG/0.6ML IJ SOSY
300.0000 ug | PREFILLED_SYRINGE | Freq: Once | INTRAMUSCULAR | Status: AC
  Administered 2014-10-11: 300 ug via SUBCUTANEOUS
  Filled 2014-10-11: qty 0.6

## 2014-10-11 MED ORDER — SODIUM CHLORIDE 0.9 % IV SOLN
510.0000 mg | Freq: Once | INTRAVENOUS | Status: AC
  Administered 2014-10-11: 510 mg via INTRAVENOUS
  Filled 2014-10-11: qty 17

## 2014-10-11 MED ORDER — SODIUM CHLORIDE 0.9 % IV SOLN
Freq: Once | INTRAVENOUS | Status: AC
  Administered 2014-10-11: 17:00:00 via INTRAVENOUS

## 2014-10-11 MED ORDER — DIPHENHYDRAMINE HCL 25 MG PO CAPS
25.0000 mg | ORAL_CAPSULE | Freq: Once | ORAL | Status: AC
  Administered 2014-10-11: 25 mg via ORAL

## 2014-10-11 MED ORDER — METHYLPREDNISOLONE SODIUM SUCC 40 MG IJ SOLR
40.0000 mg | Freq: Once | INTRAMUSCULAR | Status: AC
  Administered 2014-10-11: 40 mg via INTRAVENOUS

## 2014-10-11 MED ORDER — METHYLPREDNISOLONE SODIUM SUCC 40 MG IJ SOLR
INTRAMUSCULAR | Status: AC
  Filled 2014-10-11: qty 1

## 2014-10-11 NOTE — Patient Instructions (Signed)

## 2014-10-12 ENCOUNTER — Telehealth: Payer: Self-pay | Admitting: Oncology

## 2014-10-12 NOTE — Telephone Encounter (Signed)
Confirm appointment 05/16

## 2014-10-15 ENCOUNTER — Other Ambulatory Visit: Payer: Self-pay | Admitting: Hematology

## 2014-10-17 ENCOUNTER — Other Ambulatory Visit: Payer: Self-pay

## 2014-10-17 ENCOUNTER — Emergency Department (HOSPITAL_COMMUNITY): Payer: Medicare Other

## 2014-10-17 ENCOUNTER — Other Ambulatory Visit (HOSPITAL_COMMUNITY): Payer: Self-pay

## 2014-10-17 ENCOUNTER — Encounter (HOSPITAL_COMMUNITY): Payer: Self-pay | Admitting: *Deleted

## 2014-10-17 ENCOUNTER — Inpatient Hospital Stay (HOSPITAL_COMMUNITY)
Admission: EM | Admit: 2014-10-17 | Discharge: 2014-10-21 | DRG: 982 | Disposition: A | Discharge status: Home / Self Care | Payer: Medicare Other | Attending: Internal Medicine | Admitting: Internal Medicine

## 2014-10-17 ENCOUNTER — Inpatient Hospital Stay (HOSPITAL_COMMUNITY): Payer: Medicare Other

## 2014-10-17 DIAGNOSIS — D649 Anemia, unspecified: Secondary | ICD-10-CM

## 2014-10-17 DIAGNOSIS — S0633AA Contusion and laceration of cerebrum, unspecified, with loss of consciousness status unknown, initial encounter: Secondary | ICD-10-CM | POA: Diagnosis present

## 2014-10-17 DIAGNOSIS — W19XXXA Unspecified fall, initial encounter: Secondary | ICD-10-CM | POA: Insufficient documentation

## 2014-10-17 DIAGNOSIS — W1830XA Fall on same level, unspecified, initial encounter: Secondary | ICD-10-CM | POA: Diagnosis present

## 2014-10-17 DIAGNOSIS — I272 Other secondary pulmonary hypertension: Secondary | ICD-10-CM | POA: Diagnosis present

## 2014-10-17 DIAGNOSIS — N189 Chronic kidney disease, unspecified: Secondary | ICD-10-CM

## 2014-10-17 DIAGNOSIS — S0101XA Laceration without foreign body of scalp, initial encounter: Secondary | ICD-10-CM | POA: Diagnosis present

## 2014-10-17 DIAGNOSIS — I129 Hypertensive chronic kidney disease with stage 1 through stage 4 chronic kidney disease, or unspecified chronic kidney disease: Secondary | ICD-10-CM | POA: Diagnosis present

## 2014-10-17 DIAGNOSIS — N184 Chronic kidney disease, stage 4 (severe): Secondary | ICD-10-CM | POA: Diagnosis present

## 2014-10-17 DIAGNOSIS — D6949 Other primary thrombocytopenia: Secondary | ICD-10-CM | POA: Diagnosis present

## 2014-10-17 DIAGNOSIS — D631 Anemia in chronic kidney disease: Secondary | ICD-10-CM | POA: Diagnosis present

## 2014-10-17 DIAGNOSIS — D62 Acute posthemorrhagic anemia: Secondary | ICD-10-CM | POA: Diagnosis present

## 2014-10-17 DIAGNOSIS — I2510 Atherosclerotic heart disease of native coronary artery without angina pectoris: Secondary | ICD-10-CM | POA: Diagnosis present

## 2014-10-17 DIAGNOSIS — R270 Ataxia, unspecified: Secondary | ICD-10-CM

## 2014-10-17 DIAGNOSIS — S0191XA Laceration without foreign body of unspecified part of head, initial encounter: Secondary | ICD-10-CM | POA: Diagnosis not present

## 2014-10-17 DIAGNOSIS — D473 Essential (hemorrhagic) thrombocythemia: Secondary | ICD-10-CM | POA: Diagnosis present

## 2014-10-17 DIAGNOSIS — Z7982 Long term (current) use of aspirin: Secondary | ICD-10-CM

## 2014-10-17 DIAGNOSIS — Q2733 Arteriovenous malformation of digestive system vessel: Secondary | ICD-10-CM | POA: Diagnosis not present

## 2014-10-17 DIAGNOSIS — K5520 Angiodysplasia of colon without hemorrhage: Secondary | ICD-10-CM

## 2014-10-17 DIAGNOSIS — I4510 Unspecified right bundle-branch block: Secondary | ICD-10-CM | POA: Diagnosis present

## 2014-10-17 DIAGNOSIS — F17220 Nicotine dependence, chewing tobacco, uncomplicated: Secondary | ICD-10-CM | POA: Diagnosis present

## 2014-10-17 DIAGNOSIS — Z96651 Presence of right artificial knee joint: Secondary | ICD-10-CM | POA: Diagnosis present

## 2014-10-17 DIAGNOSIS — R55 Syncope and collapse: Principal | ICD-10-CM | POA: Diagnosis present

## 2014-10-17 DIAGNOSIS — Z951 Presence of aortocoronary bypass graft: Secondary | ICD-10-CM | POA: Diagnosis present

## 2014-10-17 DIAGNOSIS — Z79899 Other long term (current) drug therapy: Secondary | ICD-10-CM | POA: Diagnosis not present

## 2014-10-17 DIAGNOSIS — S06339A Contusion and laceration of cerebrum, unspecified, with loss of consciousness of unspecified duration, initial encounter: Secondary | ICD-10-CM | POA: Diagnosis present

## 2014-10-17 DIAGNOSIS — I10 Essential (primary) hypertension: Secondary | ICD-10-CM | POA: Diagnosis not present

## 2014-10-17 DIAGNOSIS — Z888 Allergy status to other drugs, medicaments and biological substances status: Secondary | ICD-10-CM

## 2014-10-17 DIAGNOSIS — I255 Ischemic cardiomyopathy: Secondary | ICD-10-CM | POA: Diagnosis present

## 2014-10-17 DIAGNOSIS — K219 Gastro-esophageal reflux disease without esophagitis: Secondary | ICD-10-CM | POA: Diagnosis present

## 2014-10-17 DIAGNOSIS — G459 Transient cerebral ischemic attack, unspecified: Secondary | ICD-10-CM | POA: Diagnosis present

## 2014-10-17 DIAGNOSIS — R269 Unspecified abnormalities of gait and mobility: Secondary | ICD-10-CM | POA: Diagnosis not present

## 2014-10-17 DIAGNOSIS — W19XXXD Unspecified fall, subsequent encounter: Secondary | ICD-10-CM | POA: Diagnosis not present

## 2014-10-17 HISTORY — DX: Unspecified hearing loss, unspecified ear: H91.90

## 2014-10-17 LAB — CBC
HEMATOCRIT: 24.4 % — AB (ref 39.0–52.0)
HEMATOCRIT: 20 % — AB (ref 39.0–52.0)
HEMOGLOBIN: 7.7 g/dL — AB (ref 13.0–17.0)
Hemoglobin: 6.2 g/dL — CL (ref 13.0–17.0)
MCH: 29.7 pg (ref 26.0–34.0)
MCH: 29 pg (ref 26.0–34.0)
MCHC: 31.6 g/dL (ref 30.0–36.0)
MCHC: 31 g/dL (ref 30.0–36.0)
MCV: 94.2 fL (ref 78.0–100.0)
MCV: 93.5 fL (ref 78.0–100.0)
PLATELETS: 391 10*3/uL (ref 150–400)
PLATELETS: 320 10*3/uL (ref 150–400)
RBC: 2.59 MIL/uL — ABNORMAL LOW (ref 4.22–5.81)
RBC: 2.14 MIL/uL — AB (ref 4.22–5.81)
RDW: 19 % — ABNORMAL HIGH (ref 11.5–15.5)
RDW: 19.2 % — AB (ref 11.5–15.5)
WBC: 11 10*3/uL — AB (ref 4.0–10.5)
WBC: 5.9 10*3/uL (ref 4.0–10.5)

## 2014-10-17 LAB — CBC WITH DIFFERENTIAL/PLATELET
Basophils Absolute: 0 10*3/uL (ref 0.0–0.1)
Basophils Relative: 0 % (ref 0–1)
EOS ABS: 0.3 10*3/uL (ref 0.0–0.7)
Eosinophils Relative: 3 % (ref 0–5)
HCT: 29.7 % — ABNORMAL LOW (ref 39.0–52.0)
Hemoglobin: 8.9 g/dL — ABNORMAL LOW (ref 13.0–17.0)
Lymphocytes Relative: 15 % (ref 12–46)
Lymphs Abs: 1.3 10*3/uL (ref 0.7–4.0)
MCH: 28.2 pg (ref 26.0–34.0)
MCHC: 30 g/dL (ref 30.0–36.0)
MCV: 94 fL (ref 78.0–100.0)
MONO ABS: 0.9 10*3/uL (ref 0.1–1.0)
Monocytes Relative: 10 % (ref 3–12)
NEUTROS ABS: 6.5 10*3/uL (ref 1.7–7.7)
Neutrophils Relative %: 72 % (ref 43–77)
Platelets: 504 10*3/uL — ABNORMAL HIGH (ref 150–400)
RBC: 3.16 MIL/uL — ABNORMAL LOW (ref 4.22–5.81)
RDW: 19.4 % — AB (ref 11.5–15.5)
WBC: 9 10*3/uL (ref 4.0–10.5)

## 2014-10-17 LAB — COMPREHENSIVE METABOLIC PANEL
ALBUMIN: 3.2 g/dL — AB (ref 3.5–5.0)
ALT: 15 U/L — ABNORMAL LOW (ref 17–63)
ANION GAP: 12 (ref 5–15)
AST: 28 U/L (ref 15–41)
Alkaline Phosphatase: 68 U/L (ref 38–126)
BUN: 98 mg/dL — ABNORMAL HIGH (ref 6–20)
CALCIUM: 8.7 mg/dL — AB (ref 8.9–10.3)
CO2: 19 mmol/L — ABNORMAL LOW (ref 22–32)
Chloride: 106 mmol/L (ref 101–111)
Creatinine, Ser: 2.91 mg/dL — ABNORMAL HIGH (ref 0.61–1.24)
GFR calc Af Amer: 22 mL/min — ABNORMAL LOW (ref >60)
GFR, EST NON AFRICAN AMERICAN: 19 mL/min — AB (ref >60)
Glucose, Bld: 135 mg/dL — ABNORMAL HIGH (ref 65–99)
Potassium: 5.1 mmol/L (ref 3.5–5.1)
Sodium: 137 mmol/L (ref 135–145)
TOTAL PROTEIN: 5.8 g/dL — AB (ref 6.5–8.1)
Total Bilirubin: 0.6 mg/dL (ref 0.3–1.2)

## 2014-10-17 LAB — I-STAT CHEM 8, ED
BUN: 97 mg/dL — AB (ref 6–20)
CREATININE: 2.8 mg/dL — AB (ref 0.61–1.24)
Calcium, Ion: 1.16 mmol/L (ref 1.13–1.30)
Chloride: 107 mmol/L (ref 101–111)
Glucose, Bld: 133 mg/dL — ABNORMAL HIGH (ref 65–99)
HCT: 30 % — ABNORMAL LOW (ref 39.0–52.0)
Hemoglobin: 10.2 g/dL — ABNORMAL LOW (ref 13.0–17.0)
POTASSIUM: 5.1 mmol/L (ref 3.5–5.1)
Sodium: 137 mmol/L (ref 135–145)
TCO2: 18 mmol/L (ref 0–100)

## 2014-10-17 LAB — PROTIME-INR
INR: 1.23 (ref 0.00–1.49)
PROTHROMBIN TIME: 15.6 s — AB (ref 11.6–15.2)

## 2014-10-17 LAB — I-STAT CG4 LACTIC ACID, ED: Lactic Acid, Venous: 1.66 mmol/L (ref 0.5–2.0)

## 2014-10-17 LAB — TROPONIN I
Troponin I: <0.03 ng/mL (ref <0.031)
Troponin I: <0.03 ng/mL (ref <0.031)

## 2014-10-17 LAB — CG4 I-STAT (LACTIC ACID): Lactic Acid, Venous: 1.23 mmol/L (ref 0.5–2.0)

## 2014-10-17 LAB — CLOSTRIDIUM DIFFICILE BY PCR: CDIFFPCR: NEGATIVE

## 2014-10-17 LAB — I-STAT TROPONIN, ED: Troponin i, poc: 0.01 ng/mL (ref 0.00–0.08)

## 2014-10-17 MED ORDER — OXYCODONE HCL 5 MG PO TABS
5.0000 mg | ORAL_TABLET | ORAL | Status: DC | PRN
  Administered 2014-10-19 – 2014-10-20 (×3): 5 mg via ORAL
  Filled 2014-10-17 (×3): qty 1

## 2014-10-17 MED ORDER — SODIUM CHLORIDE 0.9 % IJ SOLN
3.0000 mL | Freq: Two times a day (BID) | INTRAMUSCULAR | Status: DC
  Administered 2014-10-17 – 2014-10-21 (×9): 3 mL via INTRAVENOUS

## 2014-10-17 MED ORDER — METOPROLOL SUCCINATE ER 50 MG PO TB24
50.0000 mg | ORAL_TABLET | Freq: Every day | ORAL | Status: DC
  Administered 2014-10-18 – 2014-10-21 (×4): 50 mg via ORAL
  Filled 2014-10-17 (×4): qty 1

## 2014-10-17 MED ORDER — ONDANSETRON HCL 4 MG/2ML IJ SOLN: 4.0000 mg | Freq: Four times a day (QID) | INTRAMUSCULAR | Status: DC | PRN

## 2014-10-17 MED ORDER — PANTOPRAZOLE SODIUM 40 MG PO TBEC
40.0000 mg | DELAYED_RELEASE_TABLET | Freq: Two times a day (BID) | ORAL | Status: DC
  Administered 2014-10-17 – 2014-10-21 (×8): 40 mg via ORAL
  Filled 2014-10-17 (×7): qty 1

## 2014-10-17 MED ORDER — SODIUM CHLORIDE 0.9 % IV SOLN
INTRAVENOUS | Status: DC
  Administered 2014-10-17 – 2014-10-19 (×3): via INTRAVENOUS

## 2014-10-17 MED ORDER — ADULT MULTIVITAMIN W/MINERALS CH
ORAL_TABLET | Freq: Every day | ORAL | Status: DC
  Administered 2014-10-17 – 2014-10-21 (×5): 1 via ORAL
  Filled 2014-10-17 (×5): qty 1

## 2014-10-17 MED ORDER — HYDRALAZINE HCL 25 MG PO TABS
25.0000 mg | ORAL_TABLET | Freq: Three times a day (TID) | ORAL | Status: DC
  Administered 2014-10-17 – 2014-10-19 (×8): 25 mg via ORAL
  Filled 2014-10-17 (×12): qty 1

## 2014-10-17 MED ORDER — ACETAMINOPHEN 325 MG PO TABS
650.0000 mg | ORAL_TABLET | Freq: Four times a day (QID) | ORAL | Status: DC | PRN
Start: 2014-10-17 — End: 2014-10-21
  Administered 2014-10-17 – 2014-10-20 (×5): 650 mg via ORAL
  Filled 2014-10-17 (×5): qty 2

## 2014-10-17 MED ORDER — ACETAMINOPHEN 650 MG RE SUPP: 650.0000 mg | Freq: Four times a day (QID) | RECTAL | Status: DC | PRN

## 2014-10-17 MED ORDER — DOCUSATE SODIUM 100 MG PO CAPS
100.0000 mg | ORAL_CAPSULE | Freq: Two times a day (BID) | ORAL | Status: DC
  Administered 2014-10-17 – 2014-10-21 (×8): 100 mg via ORAL
  Filled 2014-10-17 (×10): qty 1

## 2014-10-17 MED ORDER — ACYCLOVIR 200 MG PO CAPS: 200.0000 mg | ORAL_CAPSULE | ORAL | Status: DC

## 2014-10-17 MED ORDER — TAMSULOSIN HCL 0.4 MG PO CAPS
0.4000 mg | ORAL_CAPSULE | Freq: Every day | ORAL | Status: DC
  Administered 2014-10-17 – 2014-10-21 (×5): 0.4 mg via ORAL
  Filled 2014-10-17 (×5): qty 1

## 2014-10-17 MED ORDER — ACETAMINOPHEN-CODEINE #3 300-30 MG PO TABS
1.0000 | ORAL_TABLET | ORAL | Status: DC | PRN
  Administered 2014-10-20: 1 via ORAL
  Filled 2014-10-17: qty 1

## 2014-10-17 MED ORDER — CLONIDINE HCL 0.3 MG PO TABS
0.3000 mg | ORAL_TABLET | Freq: Three times a day (TID) | ORAL | Status: DC
  Administered 2014-10-17 – 2014-10-21 (×13): 0.3 mg via ORAL
  Filled 2014-10-17 (×14): qty 1

## 2014-10-17 MED ORDER — MORPHINE SULFATE 2 MG/ML IJ SOLN: 1.0000 mg | INTRAMUSCULAR | Status: DC | PRN

## 2014-10-17 MED ORDER — ANAGRELIDE HCL 0.5 MG PO CAPS
1.0000 mg | ORAL_CAPSULE | Freq: Every day | ORAL | Status: DC
  Administered 2014-10-18 – 2014-10-21 (×4): 1 mg via ORAL
  Filled 2014-10-17 (×2): qty 2
  Filled 2014-10-17: qty 1
  Filled 2014-10-17 (×2): qty 2

## 2014-10-17 MED ORDER — ONDANSETRON HCL 4 MG PO TABS: 4.0000 mg | ORAL_TABLET | Freq: Four times a day (QID) | ORAL | Status: DC | PRN

## 2014-10-17 NOTE — ED Notes (Signed)
C-Collar applied per MD Sabra Heck. Pt monitored by pulse ox, bp cuff, and 12-lead. Family in room with pt.

## 2014-10-17 NOTE — H&P (Signed)
[facility] History and Physical                                                                                    [name], is a 81 y.o. male  MRN: [id number]   DOB - 1932/03/18  Admit Date - 10/17/2014  Outpatient Primary MD for the patient is [name], MD  Referring MD: Sabra Heck / ER  With History of -  Past Medical History  Diagnosis Date  . Chronic kidney disease   . Thrombocythemia   . Esophageal stricture   . Anemia   . Anemia due to chronic renal failure treated with erythropoietin 03/24/2011  . Retinal vein thrombosis, left   . Coronary artery disease   . Hypertension   . GERD (gastroesophageal reflux disease)   . Arthritis   . Anginal pain   . Erosive esophagitis     grade 4  . Diverticulosis   . Internal hemorrhoids       Past Surgical History  Procedure Laterality Date  . Replacement total knee Right 04/19/08  . Av fistula placement Left 12/01/2013    Procedure: RADIOCEPHALIC ARTERIOVENOUS (AV) FISTULA CREATION;  Surgeon: Elam Dutch, MD;  Location: [facility];  Service: Vascular;  Laterality: Left;  . Coronary artery bypass graft  2008    x 5  . Eye surgery Left yrs ago    vitrectomy  . Eye brow lift Right     01-11-2014  . Inguinal hernia repair Left 05/08/01  . Colonoscopy N/A 02/02/2014    Procedure: COLONOSCOPY;  Surgeon: Inda Castle, MD;  Location: [facility];  Service: Endoscopy;  Laterality: N/A;  . Esophagogastroduodenoscopy N/A 02/02/2014    Procedure: ESOPHAGOGASTRODUODENOSCOPY (EGD);  Surgeon: Inda Castle, MD;  Location: [facility];  Service: Endoscopy;  Laterality: N/A;  . Ligation of arteriovenous  fistula Left 07/06/2014    Procedure: LIGATION OF ARTERIOVENOUS  FISTULA;  Surgeon: Elam Dutch, MD;  Location: [place];  Service: Vascular;  Laterality: Left;    in for   Chief Complaint  Patient presents with  . Fall  . Head Injury     HPI This is a 81 year old male patient with chronic kidney disease stage IV  anemia of chronic renal failure on erythropoietin as well as thrombocythemia on Agrylin, history of CAD and associated ischemic cardiomyopathy last echo in 2013 with an EF of 45%, he also has pulmonary hypertension 51 mmHg with associated moderate TR,  mild RV dilatation, and AV fistula has ordered then created, he has has a history of erosive esophagitis and esophageal stricture. Patient presents to the ER after a syncopal episode with collapse at home. Initially reported that episode was unwitnessed and patient unable to recall what had happened. Patient does not recall what happened the patient's wife did see patient walked to the bathroom and suddenly collapsed to the floor. Patient was out for about 2 minutes and then awakened completely normal and had no further neurological events. During the fall patient hit his head and sustained a significant and deep laceration and experienced significant bleeding from the 4 head in the field and after arrival to the ER. ER  physician reports complex laceration that required not only vessel ligation but closure with sutures. Dr. Buelah Manis was consulted for complex wound closure. In talking with the patient and his other family members patient has had no issues recently with chest pain, dyspnea on exertion, palpitations, no GI symptoms such as nausea vomiting diarrhea. He recently underwent a capsule endoscopy that showed small intestinal AVMs currently not bleeding. Patient reports he worked outside yesterday and feels he may become somewhat dehydrated.  Upon arrival to the ER patient was afebrile, heart rate in the 60s, BP 173/78, room air saturations 94%, as noted above patient was having significant bleeding from the right scalp laceration mostly controlled prior to laceration with compression. BUN 97 and creatinine 2.8. Glucose 133. Troponin 0.01, lactic acid 1.66, initial hemoglobin 10.2 noting baseline around 8.9 to 7.2. White count 11,000 and repeat hemoglobin 7.7.  EKG shows new right bundle branch block. CT head unremarkable except for visualization of right frontoparietal scalp swelling with hematoma. CT of the neck without any acute injury. Right hip x-ray unremarkable. Upon my exam patient denies any weakness or numbness in the extremities. Minor headache site of laceration.   Review of Systems   In addition to the HPI above,  No Fever-chills, myalgias or other constitutional symptoms No Headache, changes with Vision or hearing, new weakness, tingling, numbness in any extremity, No problems swallowing food or Liquids, indigestion/reflux No Chest pain, Cough or Shortness of Breath, palpitations, orthopnea or DOE No Abdominal pain, N/V; no recent melena or hematochezia, no dark tarry stools, Bowel movements are regular, No dysuria, hematuria or flank pain No new skin rashes, lesions, masses or bruises, No new joints pains-aches No recent weight gain or loss No polyuria, polydypsia or polyphagia,  *A full 10 point Review of Systems was done, except as stated above, all other Review of Systems were negative.  Social History History  Substance Use Topics  . Smoking status: Former Smoker    Types: Cigarettes    Quit date: 11/27/1978  . Smokeless tobacco: Former Systems developer    Types: [place] date: 11/27/1978     Comment: quit 30 +yrs ago  . Alcohol Use: No     Comment: Quit 30 + yrs ago    Resides at: Private residence  Lives with: Wife  Ambulatory status: Was ambulatory without assistive devices prior to admission   Family History Family History  Problem Relation Age of Onset  . Dementia Mother   . Angina Father   . Heart attack Neg Hx      Prior to Admission medications   Medication Sig Start Date End Date Taking? Authorizing Provider  acetaminophen-codeine (TYLENOL #2) 300-15 MG per tablet Take 1 tablet by mouth every 4 (four) hours as needed for moderate pain. 07/06/14  Yes Elam Dutch, MD  acyclovir (ZOVIRAX) 200 MG capsule  Take 200 mg by mouth as directed. 09/28/14  Yes Historical Provider, MD  anagrelide (AGRYLIN) 1 MG capsule TAKE ONE CAPSULE BY MOUTH ONCE DAILY OR AS DIRECTED BY PHYSICIAN. 10/05/14  Yes Truitt Merle, MD  aspirin EC 81 MG tablet Take 81 mg by mouth daily.   Yes Historical Provider, MD  cloNIDine (CATAPRES) 0.3 MG tablet Take 0.6 mg by mouth 3 (three) times daily. 09/28/14  Yes Historical Provider, MD  furosemide (LASIX) 40 MG tablet Take 40 mg by mouth 2 (two) times daily.    Yes Historical Provider, MD  hydrALAZINE (APRESOLINE) 25 MG tablet take 1 tablet by mouth  three times a day 02/15/14  Yes Larey Dresser, MD  metoprolol succinate (TOPROL-XL) 100 MG 24 hr tablet take 1 tablet by mouth twice a day with OR IMMEDIATELY AFTER A MEAL Patient taking differently: Take 1100m by mouth twice daily. 02/15/14  Yes DLarey Dresser MD  Multiple Vitamins-Minerals (MULTIVITAMIN ADULT) TABS Take 1 tablet by mouth daily.   Yes Historical Provider, MD  pantoprazole (PROTONIX) 40 MG tablet Take 1 tablet (40 mg total) by mouth 2 (two) times daily. 07/21/14  Yes RInda Castle MD  Tamsulosin HCl (FLOMAX) 0.4 MG CAPS Take 0.4 mg by mouth daily after supper.    Yes Historical Provider, MD  ferrous sulfate 325 (65 FE) MG EC tablet Take 1 tablet (325 mg total) by mouth 3 (three) times daily with meals. Patient not taking: Reported on 10/17/2014 09/22/14   RInda Castle MD    Allergies  Allergen Reactions  . Isosorbide Other (See Comments)    Caused a blood clot  . Norvasc [Amlodipine Besylate] Other (See Comments)    TIA's    Physical Exam  Vitals  Blood pressure 141/53, pulse 58, temperature 98.5 F (36.9 C), temperature source Oral, resp. rate 16, weight 162 lb (73.483 kg), SpO2 100 %.   General:  In no acute distress, appears underweight  Psych:  Normal affect, Denies Suicidal or Homicidal ideations, Awake Alert, Oriented X 3. Speech and thought patterns are clear and appropriate, no apparent short term  memory deficits  Neuro:   No focal neurological deficits, CN II through XII intact, Strength 5/5 all 4 extremities, Sensation intact all 4 extremities.  ENT:  Ears and Eyes appear Normal, Conjunctivae clear, PER. Moist oral mucosa without erythema or exudates. Right temporoparietal area with recently sutured wound with superficial ecchymosis  Neck:  Supple, No lymphadenopathy appreciated  Respiratory:  Symmetrical chest wall movement, Good air movement bilaterally, CTAB. Room Air  Cardiac:  RRR with bradycardic rate in the mid to high 50s, No Murmurs, no LE edema noted, no JVD, No carotid bruits, peripheral pulses palpable at 2+  Abdomen:  Positive bowel sounds, Soft, Non tender, Non distended,  No masses appreciated, no obvious hepatosplenomegaly  Skin:  No Cyanosis, Normal Skin Turgor, No Skin Rash or Bruise.  Extremities: Symmetrical without obvious trauma or injury,  no effusions.  Data Review  CBC  Recent Labs Lab 10/11/14 1507 10/17/14 0850 10/17/14 0914  WBC 5.0 9.0  --   HGB 8.4* 8.9* 10.2*  HCT 26.3* 29.7* 30.0*  PLT 331 504*  --   MCV 88.9 94.0  --   MCH 28.4 28.2  --   MCHC 32.0 30.0  --   RDW 17.8* 19.4*  --   LYMPHSABS 0.8* 1.3  --   MONOABS 0.5 0.9  --   EOSABS 0.3 0.3  --   BASOSABS 0.1 0.0  --     Chemistries   Recent Labs Lab 10/17/14 0850 10/17/14 0914  NA 137 137  K 5.1 5.1  CL 106 107  CO2 19*  --   GLUCOSE 135* 133*  BUN 98* 97*  CREATININE 2.91* 2.80*  CALCIUM 8.7*  --   AST 28  --   ALT 15*  --   ALKPHOS 68  --   BILITOT 0.6  --     estimated creatinine clearance is 20.4 mL/min (by C-G formula based on Cr of 2.8).  No results for input(s): TSH, T4TOTAL, T3FREE, THYROIDAB in the last 72 hours.  Invalid input(s):  FREET3  Coagulation profile  Recent Labs Lab 10/17/14 0850  INR 1.23    No results for input(s): DDIMER in the last 72 hours.  Cardiac Enzymes No results for input(s): CKMB, TROPONINI, MYOGLOBIN in the last  168 hours.  Invalid input(s): CK  Invalid input(s): POCBNP  Urinalysis    Component Value Date/Time   COLORURINE YELLOW 04/20/2008 [place] 04/20/2008 1446   LABSPEC 1.010 04/20/2008 1446   PHURINE 5.5 04/20/2008 1446   GLUCOSEU NEGATIVE 04/20/2008 1446   HGBUR TRACE* 04/20/2008 1446   BILIRUBINUR NEGATIVE 04/20/2008 1446   KETONESUR NEGATIVE 04/20/2008 1446   PROTEINUR NEGATIVE 04/20/2008 1446   UROBILINOGEN 0.2 04/20/2008 1446   NITRITE NEGATIVE 04/20/2008 1446   LEUKOCYTESUR TRACE* 04/20/2008 1446    Imaging results:   Ct Head Wo Contrast  10/17/2014   CLINICAL DATA:  Status post fall on the bathroom with laceration to the right parietal and occipital region. Patient has a history of prostate cancer.  EXAM: CT HEAD WITHOUT CONTRAST  CT CERVICAL SPINE WITHOUT CONTRAST  TECHNIQUE: Multidetector CT imaging of the head and cervical spine was performed following the standard protocol without intravenous contrast. Multiplanar CT image reconstructions of the cervical spine were also generated.  COMPARISON:  MR brain August 26, 2013  FINDINGS: CT HEAD FINDINGS  There is chronic diffuse atrophy. There is no midline shift, hydrocephalus, or mass. No acute hemorrhage or acute transcortical infarct is identified. The bony calvarium is intact. There is right frontal parietal scalp swelling and hematoma. Complete opacity of bilateral maxillary sinuses are identified.  CT CERVICAL SPINE FINDINGS  There is no acute fracture or dislocation. Degenerative joint changes of the spine are identified. The prevertebral soft tissues are normal. There is a 1.4 x 0.7 cm lytic lesion in the anterior left C1.  IMPRESSION: Right frontal/ parietal scalp swelling and hematoma. No intracranial hemorrhage is identified.  No acute fracture or dislocation of cervical spine.   Electronically Signed   By: Abelardo Diesel M.D.   On: 10/17/2014 10:19   Ct Cervical Spine Wo Contrast  10/17/2014   CLINICAL DATA:   Status post fall on the bathroom with laceration to the right parietal and occipital region. Patient has a history of prostate cancer.  EXAM: CT HEAD WITHOUT CONTRAST  CT CERVICAL SPINE WITHOUT CONTRAST  TECHNIQUE: Multidetector CT imaging of the head and cervical spine was performed following the standard protocol without intravenous contrast. Multiplanar CT image reconstructions of the cervical spine were also generated.  COMPARISON:  MR brain August 26, 2013  FINDINGS: CT HEAD FINDINGS  There is chronic diffuse atrophy. There is no midline shift, hydrocephalus, or mass. No acute hemorrhage or acute transcortical infarct is identified. The bony calvarium is intact. There is right frontal parietal scalp swelling and hematoma. Complete opacity of bilateral maxillary sinuses are identified.  CT CERVICAL SPINE FINDINGS  There is no acute fracture or dislocation. Degenerative joint changes of the spine are identified. The prevertebral soft tissues are normal. There is a 1.4 x 0.7 cm lytic lesion in the anterior left C1.  IMPRESSION: Right frontal/ parietal scalp swelling and hematoma. No intracranial hemorrhage is identified.  No acute fracture or dislocation of cervical spine.   Electronically Signed   By: Abelardo Diesel M.D.   On: 10/17/2014 10:19   Dg Abd 2 Views  09/22/2014   CLINICAL DATA:  Assess for retained endoscopic camera capsule  EXAM: ABDOMEN - 2 VIEW  COMPARISON:  None.  FINDINGS:  There is moderately increased stool burden throughout the colon. No radiopaque camera capsule is demonstrated. There is no small or large bowel obstructive pattern. There are degenerative changes of the lumbar spine with gentle dextrocurvature  IMPRESSION: No retained endoscopic capsule is demonstrated. Increase colonic stool burden may reflect clinical constipation.   Electronically Signed   By: David  Martinique M.D.   On: 09/22/2014 13:13   Dg Hip Port Unilat With Pelvis 1v Right  10/17/2014   CLINICAL DATA:  81 year old  male with fall on right hip this morning with right hip pain. Initial encounter.  EXAM: RIGHT HIP (WITH PELVIS) 1 VIEW PORTABLE  COMPARISON:  None.  FINDINGS: There is no evidence of hip fracture or dislocation. There is no evidence of arthropathy or other focal bone abnormality.  IMPRESSION: Negative.   Electronically Signed   By: Margarette Canada M.D.   On: 10/17/2014 10:34     EKG: (Independently reviewed) sinus bradycardia with new right bundle branch block compared to previous EKGs, voltage criteria met for LVH, QTC 577   Assessment & Plan  Principal Problem:   Syncope and collapse -Admit to telemetry -CT head negative-follow-up on MRI -Differential includes possible ischemic stroke although no current neurological deficits post event versus arrhythmia versus orthostatic versus atypical presentation of cardiac ischemia -Routine stroke workup: Echocardiogram and carotid duplex; hemoglobin A1c and lipid panel -PT/OT evaluations  Active Problems:   Acute blood loss anemia -Hemoglobin has dropped from 10 to 7.7 since arrival -Continue to cycle CBC noting at times patient's baseline hemoglobin has been 7.2 -Transfuse if hemoglobin less than 7    Posterior temporoparietal laceration -Sutures and routine wound care    CAD/ New Right Bundle branch block  -Has not been having chest pain or typical ischemic symptoms prior to arrival or since arrival -Unclear if syncopal episode related to underlying possible progressive ischemia/coronary artery disease -Cycle cardiac enzymes -Unsure of significance of new right bundle branch block -Consulted cardiology    Anemia due to chronic renal failure treated with erythropoietin -see acute blood loss anemia as above    Ischemic cardiomyopathy -Last known EF 45% in 2013 -Repeating echo as part of syncope evaluation      Essential thrombocythemia -Continue preadmission medications    Essential hypertension -Continue preadmission medications with  the following exceptions: A) Will decrease Toprol from 100 mg to 50 mg given syncopal presentation and borderline bradycardia and new right bundle branch block B) medications have not yet been reviewed by pharmacist but patient's clonidine listed as 0.6 mg TID which is a high dose so will decrease to 0.3 mg    CKD (chronic kidney disease), stage IV -Stable and at baseline except for mildly elevated BUN which may be reflective of volume depletion -Continue IV fluids    Arteriovenous malformation of small intestine -Currently no signs of active GI bleeding     DVT Prophylaxis: SCDs  Family Communication:   Wife at bedside  Code Status: Full code    Condition:  Stable  Discharge disposition: Anticipate discharge back home with wife. May benefit from PT/OT therapies after discharge  Time spent in minutes : 60      Hawkin Charo L. ANP on 10/17/2014 at 12:22 PM  Between 7am to 7pm - Pager - [phone]  After 7pm go to [email or web address] - password [email or web address]  And look for the night coverage person covering me after hours  [facility]

## 2014-10-17 NOTE — Progress Notes (Signed)
Patient sustained head injury with profuse bleeding from a fall at home.  Checked in with daughter and provided emotional and spiritual support to patient.  Patient is alert and appears calm. He states that his [name] faith is a meaningful support to him and he feels well cared for.  Oriented daughter and patient to support services.    Please contact if further assistance is needed.  Luana Shu [phone]    10/17/14 0900  Clinical Encounter Type  Visited With Patient and family together  Visit Type Initial  Referral From Nurse  Stress Factors  Patient Stress Factors Health changes  Family Stress Factors Health changes

## 2014-10-17 NOTE — ED Notes (Signed)
Pt in via EMS, pt fell this morning in his bedroom, pt unsure of why he fell, unknown LOC, pt was sitting on his porch when EMS arrived with lots of blood around him, laceration to forehead area, unable to control bleeding en route, bleeding has been constant but EMS does not describe bleeding as spurting or pulsating, pressure dressing applied en route with constant pressure from EMS, pt alert and oriented, 18g in RAC

## 2014-10-17 NOTE — ED Notes (Signed)
Sutures being placed by MD miller, pressure dressing applied with direct pressure being given, back of head cleaned and no other lacerations noted

## 2014-10-17 NOTE — ED Notes (Signed)
Pt assisted to bedside commode to have a bowel movement, after bowel movement pt began to feel weak and felt like he would pass out, pt had syncopal episode with RN and tech at bedside while on commode, pt did not fall, pt picked up and placed back in bed where he became alert and oriented again, VSS, MD notified.

## 2014-10-17 NOTE — ED Provider Notes (Signed)
CSN: [id number]     Arrival date & time 10/17/14  0865 History   First MD Initiated Contact with Patient 10/17/14 0840     Chief Complaint  Patient presents with  . Fall  . Head Injury     (Consider location/radiation/quality/duration/timing/severity/associated sxs/prior Treatment) HPI Comments: The patient is a elderly gentleman who had a fall this morning, he does not remember the fall, states that when he woke up on the floor he had brisk bleeding from the right side of his head and was only wearing one bedroom slipper prompting him to think that he may have tripped causing the fall. He has no memory of this. He has a slight headache, her medics found the patient on the front porch bleeding briskly from his head wound, normal mental status, normal vital signs. The patient has not had this problem in the past, he does  take a baby aspirin daily. According to lab work and prior oncology notes the patient is known to have an anemia caused by chronic renal failure, he takes erythropoietin injections, he also has thrombocytosis which is treated with medicines as well. The patient does have a history of coronary artery bypass grafting, he does not have any chest pain, no shortness of breath, no blurred vision, he denies any recent symptoms such as fevers chills nausea or vomiting. A compression dressing was placed in the field prior to arrival because of the amount of bleeding.   Patient is a 81 y.o. male presenting with fall and head injury. The history is provided by the patient, medical records and the EMS personnel.  Fall  Head Injury   Past Medical History  Diagnosis Date  . Chronic kidney disease   . Thrombocythemia   . Esophageal stricture   . Anemia   . Anemia due to chronic renal failure treated with erythropoietin 03/24/2011  . Retinal vein thrombosis, left   . Coronary artery disease   . Hypertension   . GERD (gastroesophageal reflux disease)   . Arthritis   . Anginal pain   .  Erosive esophagitis     grade 4  . Diverticulosis   . Internal hemorrhoids    Past Surgical History  Procedure Laterality Date  . Replacement total knee Right 04/19/08  . Av fistula placement Left 12/01/2013    Procedure: RADIOCEPHALIC ARTERIOVENOUS (AV) FISTULA CREATION;  Surgeon: Elam Dutch, MD;  Location: [facility];  Service: Vascular;  Laterality: Left;  . Coronary artery bypass graft  2008    x 5  . Eye surgery Left yrs ago    vitrectomy  . Eye brow lift Right     01-11-2014  . Inguinal hernia repair Left 05/08/01  . Colonoscopy N/A 02/02/2014    Procedure: COLONOSCOPY;  Surgeon: Inda Castle, MD;  Location: [facility];  Service: Endoscopy;  Laterality: N/A;  . Esophagogastroduodenoscopy N/A 02/02/2014    Procedure: ESOPHAGOGASTRODUODENOSCOPY (EGD);  Surgeon: Inda Castle, MD;  Location: [facility];  Service: Endoscopy;  Laterality: N/A;  . Ligation of arteriovenous  fistula Left 07/06/2014    Procedure: LIGATION OF ARTERIOVENOUS  FISTULA;  Surgeon: Elam Dutch, MD;  Location: [facility] OR;  Service: Vascular;  Laterality: Left;   Family History  Problem Relation Age of Onset  . Dementia Mother   . Angina Father   . Heart attack Neg Hx    History  Substance Use Topics  . Smoking status: Former Smoker    Types: Cigarettes    Quit date: 11/27/1978  .  Smokeless tobacco: Former Systems developer    Types: [place] date: 11/27/1978     Comment: quit 30 +yrs ago  . Alcohol Use: No     Comment: Quit 30 + yrs ago    Review of Systems  All other systems reviewed and are negative.     Allergies  Isosorbide and Norvasc  Home Medications   Prior to Admission medications   Medication Sig Start Date End Date Taking? Authorizing Provider  acetaminophen-codeine (TYLENOL #2) 300-15 MG per tablet Take 1 tablet by mouth every 4 (four) hours as needed for moderate pain. 07/06/14  Yes Elam Dutch, MD  acyclovir (ZOVIRAX) 200 MG capsule Take 200 mg by mouth as directed. 09/28/14   Yes Historical Provider, MD  anagrelide (AGRYLIN) 1 MG capsule TAKE ONE CAPSULE BY MOUTH ONCE DAILY OR AS DIRECTED BY PHYSICIAN. 10/05/14  Yes Truitt Merle, MD  aspirin EC 81 MG tablet Take 81 mg by mouth daily.   Yes Historical Provider, MD  cloNIDine (CATAPRES) 0.3 MG tablet Take 0.6 mg by mouth 3 (three) times daily. 09/28/14  Yes Historical Provider, MD  furosemide (LASIX) 40 MG tablet Take 40 mg by mouth 2 (two) times daily.    Yes Historical Provider, MD  hydrALAZINE (APRESOLINE) 25 MG tablet take 1 tablet by mouth three times a day 02/15/14  Yes Larey Dresser, MD  metoprolol succinate (TOPROL-XL) 100 MG 24 hr tablet take 1 tablet by mouth twice a day with OR IMMEDIATELY AFTER A MEAL Patient taking differently: Take 100mg  by mouth twice daily. 02/15/14  Yes Larey Dresser, MD  Multiple Vitamins-Minerals (MULTIVITAMIN ADULT) TABS Take 1 tablet by mouth daily.   Yes Historical Provider, MD  pantoprazole (PROTONIX) 40 MG tablet Take 1 tablet (40 mg total) by mouth 2 (two) times daily. 07/21/14  Yes Inda Castle, MD  Tamsulosin HCl (FLOMAX) 0.4 MG CAPS Take 0.4 mg by mouth daily after supper.    Yes Historical Provider, MD  ferrous sulfate 325 (65 FE) MG EC tablet Take 1 tablet (325 mg total) by mouth 3 (three) times daily with meals. Patient not taking: Reported on 10/17/2014 09/22/14   Inda Castle, MD   BP 153/116 mmHg  Pulse 59  Temp(Src) 97.8 F (36.6 C) (Oral)  Resp 18  Ht 5\' 11"  (1.803 m)  Wt 162 lb (73.483 kg)  BMI 22.60 kg/m2  SpO2 100% Physical Exam  Constitutional: He appears well-developed and well-nourished. No distress.  HENT:  Head: Normocephalic.  Mouth/Throat: Oropharynx is clear and moist. No oropharyngeal exudate.  4 cm laceration to the right temporal superior forehead and scalp, no periorbital bruising, no septal hematoma, no hemotympanum, no malocclusion.  Eyes: Conjunctivae and EOM are normal. Pupils are equal, round, and reactive to light. Right eye exhibits no  discharge. Left eye exhibits no discharge. No scleral icterus.  Neck: Normal range of motion. Neck supple. No JVD present. No thyromegaly present.  Cardiovascular: Normal rate, regular rhythm, normal heart sounds and intact distal pulses.  Exam reveals no gallop and no friction rub.   No murmur heard. Pulmonary/Chest: Effort normal and breath sounds normal. No respiratory distress. He has no wheezes. He has no rales.  Abdominal: Soft. Bowel sounds are normal. He exhibits no distension and no mass. There is no tenderness.  Musculoskeletal: Normal range of motion. He exhibits no edema or tenderness.  Compartments are soft, joints are supple, no obvious deformities  Lymphadenopathy:    He has no cervical  adenopathy.  Neurological: He is alert. Coordination normal.  Follows commands without difficulty, normal strength and sensation in all 4 extremities, normal mental status, cranial nerves III through XII intact, normal pupillary exam, pupils are 2 mm and equally reactive  Skin: Skin is warm and dry. No rash noted. No erythema.  Psychiatric: He has a normal mood and affect. His behavior is normal.  Nursing note and vitals reviewed.   ED Course  Procedures (including critical care time) Labs Review Labs Reviewed  CBC WITH DIFFERENTIAL/PLATELET - Abnormal; Notable for the following:    RBC 3.16 (*)    Hemoglobin 8.9 (*)    HCT 29.7 (*)    RDW 19.4 (*)    Platelets 504 (*)    All other components within normal limits  COMPREHENSIVE METABOLIC PANEL - Abnormal; Notable for the following:    CO2 19 (*)    Glucose, Bld 135 (*)    BUN 98 (*)    Creatinine, Ser 2.91 (*)    Calcium 8.7 (*)    Total Protein 5.8 (*)    Albumin 3.2 (*)    ALT 15 (*)    GFR calc non Af Amer 19 (*)    GFR calc Af Amer 22 (*)    All other components within normal limits  PROTIME-INR - Abnormal; Notable for the following:    Prothrombin Time 15.6 (*)    All other components within normal limits  CBC - Abnormal;  Notable for the following:    WBC 11.0 (*)    RBC 2.59 (*)    Hemoglobin 7.7 (*)    HCT 24.4 (*)    RDW 19.0 (*)    All other components within normal limits  I-STAT CHEM 8, ED - Abnormal; Notable for the following:    BUN 97 (*)    Creatinine, Ser 2.80 (*)    Glucose, Bld 133 (*)    Hemoglobin 10.2 (*)    HCT 30.0 (*)    All other components within normal limits  TROPONIN I  CBC  TROPONIN I  TROPONIN I  I-STAT TROPOININ, ED  I-STAT CG4 LACTIC ACID, ED  I-STAT CG4 LACTIC ACID, ED  CG4 I-STAT (LACTIC ACID)  TYPE AND SCREEN    Imaging Review Ct Head Wo Contrast  10/17/2014   CLINICAL DATA:  Status post fall on the bathroom with laceration to the right parietal and occipital region. Patient has a history of prostate cancer.  EXAM: CT HEAD WITHOUT CONTRAST  CT CERVICAL SPINE WITHOUT CONTRAST  TECHNIQUE: Multidetector CT imaging of the head and cervical spine was performed following the standard protocol without intravenous contrast. Multiplanar CT image reconstructions of the cervical spine were also generated.  COMPARISON:  MR brain August 26, 2013  FINDINGS: CT HEAD FINDINGS  There is chronic diffuse atrophy. There is no midline shift, hydrocephalus, or mass. No acute hemorrhage or acute transcortical infarct is identified. The bony calvarium is intact. There is right frontal parietal scalp swelling and hematoma. Complete opacity of bilateral maxillary sinuses are identified.  CT CERVICAL SPINE FINDINGS  There is no acute fracture or dislocation. Degenerative joint changes of the spine are identified. The prevertebral soft tissues are normal. There is a 1.4 x 0.7 cm lytic lesion in the anterior left C1.  IMPRESSION: Right frontal/ parietal scalp swelling and hematoma. No intracranial hemorrhage is identified.  No acute fracture or dislocation of cervical spine.   Electronically Signed   By: Abelardo Diesel M.D.   On:  10/17/2014 10:19   Ct Cervical Spine Wo Contrast  10/17/2014   CLINICAL  DATA:  Status post fall on the bathroom with laceration to the right parietal and occipital region. Patient has a history of prostate cancer.  EXAM: CT HEAD WITHOUT CONTRAST  CT CERVICAL SPINE WITHOUT CONTRAST  TECHNIQUE: Multidetector CT imaging of the head and cervical spine was performed following the standard protocol without intravenous contrast. Multiplanar CT image reconstructions of the cervical spine were also generated.  COMPARISON:  MR brain August 26, 2013  FINDINGS: CT HEAD FINDINGS  There is chronic diffuse atrophy. There is no midline shift, hydrocephalus, or mass. No acute hemorrhage or acute transcortical infarct is identified. The bony calvarium is intact. There is right frontal parietal scalp swelling and hematoma. Complete opacity of bilateral maxillary sinuses are identified.  CT CERVICAL SPINE FINDINGS  There is no acute fracture or dislocation. Degenerative joint changes of the spine are identified. The prevertebral soft tissues are normal. There is a 1.4 x 0.7 cm lytic lesion in the anterior left C1.  IMPRESSION: Right frontal/ parietal scalp swelling and hematoma. No intracranial hemorrhage is identified.  No acute fracture or dislocation of cervical spine.   Electronically Signed   By: Abelardo Diesel M.D.   On: 10/17/2014 10:19   Dg Hip Port Unilat With Pelvis 1v Right  10/17/2014   CLINICAL DATA:  81 year old male with fall on right hip this morning with right hip pain. Initial encounter.  EXAM: RIGHT HIP (WITH PELVIS) 1 VIEW PORTABLE  COMPARISON:  None.  FINDINGS: There is no evidence of hip fracture or dislocation. There is no evidence of arthropathy or other focal bone abnormality.  IMPRESSION: Negative.   Electronically Signed   By: Margarette Canada M.D.   On: 10/17/2014 10:34    ED ECG REPORT  I personally interpreted this EKG   Date: 10/17/2014   Rate: 74  Rhythm: nsr  QRS Axis: left  Intervals: normal  ST/T Wave abnormalities: nonspecific ST/T changes  Conduction  Disutrbances:intermittent RBBB  Narrative Interpretation:   Old EKG Reviewed: changes noted and no hx of BBB   MDM   Final diagnoses:  Fall  Symptomatic anemia  Laceration of head, initial encounter  Acute blood loss anemia    New onset intermittent arrhythmia - has large head laceration that required sutures and is still bleeding.  I have placed a compression dressing with a turban wrap and there is still ongoing saturation of the dressings at 10 minutes - have paged ENT at 9:15 for assistance as the wound may need to be explored.  Type and screen, likely needs transfusion given amount of blood loss - the pt is in agreement with the plan.  Cardiac monitoring reveals intermittent RBBB - no tachycardia and no hypotension.  Labs pending, anticipate need for transfusion  Dr. Buelah Manis has called back and will see pt in consultation 9:20 AM  Able to suture ligate the offending vessel prior to consultant evaluation.  The pt has had symtpomatic blood loss anemia = had syncopal event on bedsdie commode. D/w Hospitalist team who will admit.  LACERATION REPAIR Performed by: Johnna Acosta Authorized by: Johnna Acosta Consent: Verbal consent obtained. Risks and benefits: risks, benefits and alternatives were discussed Consent given by: patient Patient identity confirmed: provided demographic data Prepped and Draped in normal sterile fashion Wound explored  Laceration Location: R temporal scalp  Laceration Length: 4cm  No Foreign Bodies seen or palpated  Anesthesia: local infiltration  Local anesthetic: lidocaine  1% with epinephrine  Anesthetic total: 6  ml  Irrigation method: syringe Amount of cleaning: standard  Skin closure: 5-0 prolene  Number of sutures: 4   Technique: Simple Interrupted  Patient tolerance: Patient tolerated the procedure well with no immediate complications.   These sutures did not stop the bleeding - I eventually had to remove the pressure  dressing and explore the wound - a bleeding artery was eventually identified and successfully suture ligated with one vicryl suture.    Noemi Chapel, MD 10/17/14 [phone]

## 2014-10-17 NOTE — Consult Note (Signed)
CARDIOLOGY CONSULT NOTE   Patient ID: [name] MRN: [id number], DOB/AGE: 1931-08-04   Admit date: 10/17/2014 Date of Consult: 10/17/2014   Primary Physician: Bryan Hatchet, MD Primary Cardiologist: Dr. Aundra [name]  Pt. Profile  81 year old gentleman with history of coronary artery disease and chronic kidney disease stage IV admitted after syncopal episode at home.  He suffered a large laceration of the right temporal area.  Problem List  Past Medical History  Diagnosis Date  . Chronic kidney disease   . Thrombocythemia   . Esophageal stricture   . Anemia   . Anemia due to chronic renal failure treated with erythropoietin 03/24/2011  . Retinal vein thrombosis, left   . Coronary artery disease   . Hypertension   . GERD (gastroesophageal reflux disease)   . Arthritis   . Anginal pain   . Erosive esophagitis     grade 4  . Diverticulosis   . Internal hemorrhoids     Past Surgical History  Procedure Laterality Date  . Replacement total knee Right 04/19/08  . Av fistula placement Left 12/01/2013    Procedure: RADIOCEPHALIC ARTERIOVENOUS (AV) FISTULA CREATION;  Surgeon: Elam Dutch, MD;  Location: [place];  Service: Vascular;  Laterality: Left;  . Coronary artery bypass graft  2008    x 5  . Eye surgery Left yrs ago    vitrectomy  . Eye brow lift Right     01-11-2014  . Inguinal hernia repair Left 05/08/01  . Colonoscopy N/A 02/02/2014    Procedure: COLONOSCOPY;  Surgeon: Inda Castle, MD;  Location: [facility];  Service: Endoscopy;  Laterality: N/A;  . Esophagogastroduodenoscopy N/A 02/02/2014    Procedure: ESOPHAGOGASTRODUODENOSCOPY (EGD);  Surgeon: Inda Castle, MD;  Location: [facility];  Service: Endoscopy;  Laterality: N/A;  . Ligation of arteriovenous  fistula Left 07/06/2014    Procedure: LIGATION OF ARTERIOVENOUS  FISTULA;  Surgeon: Elam Dutch, MD;  Location: [place];  Service: Vascular;  Laterality: Left;     Allergies  Allergies  Allergen  Reactions  . Isosorbide Other (See Comments)    Caused a blood clot  . Norvasc [Amlodipine Besylate] Other (See Comments)    TIA's    HPI   This 81 year old gentleman was admitted today after he fell at home.  He has been in his usual state of health.  This morning he got up and recalls putting on his slippers at the side of his bed.  The next thing he remembers was that he was in the adjacent bathroom bleeding heavily.  In his fall he suffered a large laceration of the left temporal area.  He does not recall having any antecedent symptoms of dizziness or chest pain or shortness of breath.  He gives a past history of occasional mild dizziness when he first gets out of bed in the morning.  Typically when he feels this he will sit on the side of the bed for a minute or 2 and then stand up and when he does that he has no problems ambulating. The patient has a history of coronary disease.  He is followed by Dr. Aundra [name].  He had CABG in 2006.  He has not had any ischemic workup since that time.  An echocardiogram on 07/09/11 showed an ejection fraction of 45-50% and moderate pulmonary hypertension.  Pulmonary artery pressure was 51 mmHg.  The patient denies any history of exertional chest pain or angina.  He tries to walk for exercise.  He does  have some problems with poor balance when he walks and he uses a cane if he has to walk a long distance.  He has not been having any symptoms of congestive heart failure, orthopnea, or paroxysmal nocturnal dyspnea.  No peripheral edema.  He has stage IV chronic kidney disease.  He had had a previous AV fistula constructed but it subsequently developed problems and it had to be ligated.  Recently the patient has been told by his nephrologist that his kidney function appears to be stable at this time and the patient was led to believe that dialysis was not in his near future.  He has a history of hypertension.    Inpatient Medications  . acyclovir  200 mg Oral UD  .  anagrelide  1 mg Oral Daily  . cloNIDine  0.3 mg Oral TID  . docusate sodium  100 mg Oral BID  . hydrALAZINE  25 mg Oral TID  . metoprolol succinate  50 mg Oral Daily  . MULTIVITAMIN ADULT  1 tablet Oral Daily  . pantoprazole  40 mg Oral BID  . sodium chloride  3 mL Intravenous Q12H  . tamsulosin  0.4 mg Oral QPC supper    Family History Family History  Problem Relation Age of Onset  . Dementia Mother   . Angina Father   . Heart attack Neg Hx      Social History History   Social History  . Marital Status: Married    Spouse Name: [name]  . Number of Children: 2  . Years of Education: 12   Occupational History  .      retired   Social History Main Topics  . Smoking status: Former Smoker    Types: Cigarettes    Quit date: 11/27/1978  . Smokeless tobacco: Former Systems developer    Types: [place] date: 11/27/1978     Comment: quit 30 +yrs ago  . Alcohol Use: No     Comment: Quit 30 + yrs ago  . Drug Use: No  . Sexual Activity: Not on file   Other Topics Concern  . Not on file   Social History Narrative   Patient lives at home with wife [name]) and son.   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  Cardiovascular:  No chest pain, dyspnea on exertion, edema, orthopnea, palpitations, paroxysmal nocturnal dyspnea. Dermatological: No rash, lesions/masses Respiratory: No cough, dyspnea Urologic: No hematuria, dysuria.  Occasional nocturia Abdominal:   No nausea, vomiting, diarrhea, bright red blood per rectum, melena, or hematemesis Neurologic:  No visual changes, wkns, changes in mental status. All other systems reviewed and are otherwise negative except as noted above.  Physical Exam  Blood pressure 156/63, pulse 50, temperature 98.5 F (36.9 C), temperature source Oral, resp. rate 16, weight 162 lb (73.483 kg), SpO2 95 %.  General:  Pleasant, NAD.  There is a larger recently sutured laceration of the right temporal area of his scalp. Psych: Normal affect. Neuro: Alert and oriented X 3. Moves all extremities spontaneously. HEENT: Normal  Neck: Supple without bruits or JVD. Lungs:  Resp regular and unlabored, CTA. Heart: RRR no s3, s4, or murmurs. Abdomen: Soft, non-tender, non-distended, BS + x 4.  Extremities: No clubbing, cyanosis or edema. DP/PT/Radials 2+ and equal bilaterally.  Labs   Recent Labs  10/17/14 1232  TROPONINI <0.03   Lab Results  Component Value Date   WBC 11.0* 10/17/2014   HGB 7.7* 10/17/2014   HCT 24.4* 10/17/2014   MCV 94.2 10/17/2014   PLT 391 10/17/2014     Recent Labs Lab 10/17/14 0850 10/17/14 0914  NA 137 137  K 5.1 5.1  CL 106 107  CO2 19*  --   BUN 98* 97*  CREATININE 2.91* 2.80*  CALCIUM 8.7*  --   PROT 5.8*  --   BILITOT 0.6  --   ALKPHOS 68  --   ALT 15*  --   AST 28  --   GLUCOSE 135* 133*   Lab Results  Component Value Date   CHOL 114 01/20/2014   HDL 29.10* 01/20/2014   LDLCALC 68 01/20/2014   TRIG 87.0 01/20/2014   No results found for: DDIMER  Radiology/Studies  Ct Head Wo Contrast  10/17/2014   CLINICAL DATA:  Status post fall on the bathroom with laceration to the right parietal and occipital region. Patient has a history of prostate cancer.  EXAM: CT HEAD WITHOUT CONTRAST  CT CERVICAL SPINE WITHOUT CONTRAST  TECHNIQUE: Multidetector CT imaging of the head and cervical spine was performed following the standard protocol without intravenous contrast. Multiplanar CT image reconstructions of the cervical spine were also generated.  COMPARISON:  MR brain August 26, 2013  FINDINGS: CT HEAD FINDINGS  There is chronic diffuse atrophy. There is no midline shift, hydrocephalus, or mass. No acute hemorrhage or acute transcortical infarct is identified. The bony calvarium is intact. There is right frontal parietal scalp swelling and hematoma. Complete opacity of  bilateral maxillary sinuses are identified.  CT CERVICAL SPINE FINDINGS  There is no acute fracture or dislocation. Degenerative joint changes of the spine are identified. The prevertebral soft tissues are normal. There is a 1.4 x 0.7 cm lytic lesion in the anterior left C1.  IMPRESSION: Right frontal/ parietal scalp swelling and hematoma. No intracranial hemorrhage is identified.  No acute fracture or dislocation of cervical spine.   Electronically Signed   By: Abelardo Diesel M.D.   On: 10/17/2014 10:19   Ct Cervical Spine Wo Contrast  10/17/2014   CLINICAL DATA:  Status post fall on the bathroom with laceration to the right parietal and occipital region. Patient has a history of prostate cancer.  EXAM: CT HEAD WITHOUT CONTRAST  CT CERVICAL SPINE WITHOUT CONTRAST  TECHNIQUE: Multidetector CT imaging of the head and cervical spine was performed following the standard protocol without intravenous contrast. Multiplanar CT image reconstructions of the cervical spine were also generated.  COMPARISON:  MR brain August 26, 2013  FINDINGS: CT HEAD FINDINGS  There is chronic diffuse atrophy. There is no midline shift, hydrocephalus, or mass. No acute hemorrhage or acute transcortical infarct is identified. The bony calvarium is intact. There is right frontal parietal scalp swelling and hematoma. Complete opacity of bilateral maxillary sinuses are identified.  CT CERVICAL SPINE FINDINGS  There is no acute fracture or dislocation. Degenerative joint changes of the spine are identified. The prevertebral soft tissues are normal. There is a 1.4 x 0.7 cm lytic lesion in the anterior left C1.  IMPRESSION: Right frontal/ parietal scalp swelling and hematoma. No intracranial hemorrhage is identified.  No acute fracture or dislocation of cervical spine.   Electronically Signed   By: Abelardo Diesel M.D.   On: 10/17/2014 10:19   Dg Abd 2 Views  09/22/2014   CLINICAL DATA:  Assess for  retained endoscopic camera capsule  EXAM: ABDOMEN  - 2 VIEW  COMPARISON:  None.  FINDINGS: There is moderately increased stool burden throughout the colon. No radiopaque camera capsule is demonstrated. There is no small or large bowel obstructive pattern. There are degenerative changes of the lumbar spine with gentle dextrocurvature  IMPRESSION: No retained endoscopic capsule is demonstrated. Increase colonic stool burden may reflect clinical constipation.   Electronically Signed   By: David  Martinique M.D.   On: 09/22/2014 13:13   Dg Hip Port Unilat With Pelvis 1v Right  10/17/2014   CLINICAL DATA:  81 year old male with fall on right hip this morning with right hip pain. Initial encounter.  EXAM: RIGHT HIP (WITH PELVIS) 1 VIEW PORTABLE  COMPARISON:  None.  FINDINGS: There is no evidence of hip fracture or dislocation. There is no evidence of arthropathy or other focal bone abnormality.  IMPRESSION: Negative.   Electronically Signed   By: Margarette Canada M.D.   On: 10/17/2014 10:34    ECG  Normal sinus rhythm.  Intermittent interventricular conduction disturbance of right bundle branch block type, new since prior tracing of 03/03/2014  ASSESSMENT AND PLAN  1.  Syncope, etiology to be determined.  Possible arrhythmia versus orthostatic hypotension. 2.  Chronic anemia secondary to renal disease, with acute superimposed blood loss anemia.  Hemoglobin 7.7 3.  New intermittent right bundle branch block 4.  Chronic kidney disease stage IV 5.  Coronary artery disease status post CABG in 2006.  No subsequent angina pectoris.  On beta blocker and aspirin at home.  He had been taking Toprol-XL 100 mg twice a day. 6.  Ischemic cardiomyopathy, mild, with ejection fraction of 45-50% on last echo. 7.  Essential hypertension  Plan: Watch him on telemetry for further arrhythmias.  Cycle enzymes.  Initial troponin is normal.  I agree with lower dose of metoprolol at this time and adjust upward as necessary.  Obtain echocardiogram.  Consider subsequent outpatient 30 day  event monitor.  Berna Spare M.D. 10/17/2014, 2:03 PM

## 2014-10-17 NOTE — ED Notes (Signed)
New sutures completed by MD Sabra Heck, vessel that appeared to be causing the worsening bleeding was sutured, pressure dressing again applied, bleeding controlled at this time

## 2014-10-17 NOTE — Consult Note (Signed)
Reason for Consult: Right Temporal Laceration  Referring Physician: Dr. Tyler Aas is an 81 y.o. male.  HPI: [name] is an 81 year-old male that sustained a ground level fall while getting out of bed at home this morning.  He does not remember the fall.  It is unclear if he had a +LOC.  The patient was brought to the ER by ambulance and it was difficult to control the bleeding en-route.  Bleeding was controlled by Dr. Sabra [name] upon arrival.      PMHx:  Past Medical History  Diagnosis Date  . Chronic kidney disease   . Thrombocythemia   . Esophageal stricture   . Anemia   . Anemia due to chronic renal failure treated with erythropoietin 03/24/2011  . Retinal vein thrombosis, left   . Coronary artery disease   . Hypertension   . GERD (gastroesophageal reflux disease)   . Arthritis   . Anginal pain   . Erosive esophagitis     grade 4  . Diverticulosis   . Internal hemorrhoids     PSx:  Past Surgical History  Procedure Laterality Date  . Replacement total knee Right 04/19/08  . Av fistula placement Left 12/01/2013    Procedure: RADIOCEPHALIC ARTERIOVENOUS (AV) FISTULA CREATION;  Surgeon: Elam Dutch, MD;  Location: [place];  Service: Vascular;  Laterality: Left;  . Coronary artery bypass graft  2008    x 5  . Eye surgery Left yrs ago    vitrectomy  . Eye brow lift Right     01-11-2014  . Inguinal hernia repair Left 05/08/01  . Colonoscopy N/A 02/02/2014    Procedure: COLONOSCOPY;  Surgeon: Inda Castle, MD;  Location: [facility];  Service: Endoscopy;  Laterality: N/A;  . Esophagogastroduodenoscopy N/A 02/02/2014    Procedure: ESOPHAGOGASTRODUODENOSCOPY (EGD);  Surgeon: Inda Castle, MD;  Location: [facility];  Service: Endoscopy;  Laterality: N/A;  . Ligation of arteriovenous  fistula Left 07/06/2014    Procedure: LIGATION OF ARTERIOVENOUS  FISTULA;  Surgeon: Elam Dutch, MD;  Location: [facility];  Service: Vascular;  Laterality: Left;    Family Hx:   Family History  Problem Relation Age of Onset  . Dementia Mother   . Angina Father   . Heart attack Neg Hx     Social Hx:  reports that he quit smoking about 35 years ago. His smoking use included Cigarettes. He quit smokeless tobacco use about 35 years ago. His smokeless tobacco use included Chew. He reports that he does not drink alcohol or use illicit drugs.  Allergies:  Allergies  Allergen Reactions  . Isosorbide Other (See Comments)    Caused a blood clot  . Norvasc [Amlodipine Besylate] Other (See Comments)    TIA's    Medications: I have reviewed the patient's current medications.  Labs:  Results for orders placed or performed during the hospital encounter of 10/17/14 (from the past 48 hour(s))  CBC with Differential     Status: Abnormal   Collection Time: 10/17/14  8:50 AM  Result Value Ref Range   WBC 9.0 4.0 - 10.5 K/uL   RBC 3.16 (L) 4.22 - 5.81 MIL/uL   Hemoglobin 8.9 (L) 13.0 - 17.0 g/dL   HCT 29.7 (L) 39.0 - 52.0 %   MCV 94.0 78.0 - 100.0 fL   MCH 28.2 26.0 - 34.0 pg   MCHC 30.0 30.0 - 36.0 g/dL   RDW 19.4 (H) 11.5 - 15.5 %   Platelets  504 (H) 150 - 400 K/uL   Neutrophils Relative % 72 43 - 77 %   Neutro Abs 6.5 1.7 - 7.7 K/uL   Lymphocytes Relative 15 12 - 46 %   Lymphs Abs 1.3 0.7 - 4.0 K/uL   Monocytes Relative 10 3 - 12 %   Monocytes Absolute 0.9 0.1 - 1.0 K/uL   Eosinophils Relative 3 0 - 5 %   Eosinophils Absolute 0.3 0.0 - 0.7 K/uL   Basophils Relative 0 0 - 1 %   Basophils Absolute 0.0 0.0 - 0.1 K/uL  Comprehensive metabolic panel     Status: Abnormal   Collection Time: 10/17/14  8:50 AM  Result Value Ref Range   Sodium 137 135 - 145 mmol/L   Potassium 5.1 3.5 - 5.1 mmol/L   Chloride 106 101 - 111 mmol/L   CO2 19 (L) 22 - 32 mmol/L   Glucose, Bld 135 (H) 65 - 99 mg/dL   BUN 98 (H) 6 - 20 mg/dL   Creatinine, Ser 2.91 (H) 0.61 - 1.24 mg/dL   Calcium 8.7 (L) 8.9 - 10.3 mg/dL   Total Protein 5.8 (L) 6.5 - 8.1 g/dL   Albumin 3.2 (L) 3.5 - 5.0  g/dL   AST 28 15 - 41 U/L   ALT 15 (L) 17 - 63 U/L   Alkaline Phosphatase 68 38 - 126 U/L   Total Bilirubin 0.6 0.3 - 1.2 mg/dL   GFR calc non Af Amer 19 (L) >60 mL/min   GFR calc Af Amer 22 (L) >60 mL/min    Comment: (NOTE) The eGFR has been calculated using the CKD EPI equation. This calculation has not been validated in all clinical situations. eGFR's persistently <60 mL/min signify possible Chronic Kidney Disease.    Anion gap 12 5 - 15  Protime-INR     Status: Abnormal   Collection Time: 10/17/14  8:50 AM  Result Value Ref Range   Prothrombin Time 15.6 (H) 11.6 - 15.2 seconds   INR 1.23 0.00 - 1.49  Type and screen     Status: None   Collection Time: 10/17/14  8:53 AM  Result Value Ref Range   ABO/RH(D) A POS    Antibody Screen NEG    Sample Expiration 10/20/2014   I-Stat Troponin, ED (not at [facility])     Status: None   Collection Time: 10/17/14  9:12 AM  Result Value Ref Range   Troponin i, poc 0.01 0.00 - 0.08 ng/mL   Comment 3            Comment: Due to the release kinetics of cTnI, a negative result within the first hours of the onset of symptoms does not rule out myocardial infarction with certainty. If myocardial infarction is still suspected, repeat the test at appropriate intervals.   I-Stat Chem 8, ED     Status: Abnormal   Collection Time: 10/17/14  9:14 AM  Result Value Ref Range   Sodium 137 135 - 145 mmol/L   Potassium 5.1 3.5 - 5.1 mmol/L   Chloride 107 101 - 111 mmol/L   BUN 97 (H) 6 - 20 mg/dL   Creatinine, Ser 2.80 (H) 0.61 - 1.24 mg/dL   Glucose, Bld 133 (H) 65 - 99 mg/dL   Calcium, Ion 1.16 1.13 - 1.30 mmol/L   TCO2 18 0 - 100 mmol/L   Hemoglobin 10.2 (L) 13.0 - 17.0 g/dL   HCT 30.0 (L) 39.0 - 52.0 %  I-Stat CG4 Lactic Acid, ED  Status: None   Collection Time: 10/17/14  9:14 AM  Result Value Ref Range   Lactic Acid, Venous 1.66 0.5 - 2.0 mmol/L    Radiology: Ct Head Wo Contrast  10/17/2014   CLINICAL DATA:  Status post fall on the  bathroom with laceration to the right parietal and occipital region. Patient has a history of prostate cancer.  EXAM: CT HEAD WITHOUT CONTRAST  CT CERVICAL SPINE WITHOUT CONTRAST  TECHNIQUE: Multidetector CT imaging of the head and cervical spine was performed following the standard protocol without intravenous contrast. Multiplanar CT image reconstructions of the cervical spine were also generated.  COMPARISON:  MR brain August 26, 2013  FINDINGS: CT HEAD FINDINGS  There is chronic diffuse atrophy. There is no midline shift, hydrocephalus, or mass. No acute hemorrhage or acute transcortical infarct is identified. The bony calvarium is intact. There is right frontal parietal scalp swelling and hematoma. Complete opacity of bilateral maxillary sinuses are identified.  CT CERVICAL SPINE FINDINGS  There is no acute fracture or dislocation. Degenerative joint changes of the spine are identified. The prevertebral soft tissues are normal. There is a 1.4 x 0.7 cm lytic lesion in the anterior left C1.  IMPRESSION: Right frontal/ parietal scalp swelling and hematoma. No intracranial hemorrhage is identified.  No acute fracture or dislocation of cervical spine.   Electronically Signed   By: Abelardo Diesel M.D.   On: 10/17/2014 10:19   Ct Cervical Spine Wo Contrast  10/17/2014   CLINICAL DATA:  Status post fall on the bathroom with laceration to the right parietal and occipital region. Patient has a history of prostate cancer.  EXAM: CT HEAD WITHOUT CONTRAST  CT CERVICAL SPINE WITHOUT CONTRAST  TECHNIQUE: Multidetector CT imaging of the head and cervical spine was performed following the standard protocol without intravenous contrast. Multiplanar CT image reconstructions of the cervical spine were also generated.  COMPARISON:  MR brain August 26, 2013  FINDINGS: CT HEAD FINDINGS  There is chronic diffuse atrophy. There is no midline shift, hydrocephalus, or mass. No acute hemorrhage or acute transcortical infarct is  identified. The bony calvarium is intact. There is right frontal parietal scalp swelling and hematoma. Complete opacity of bilateral maxillary sinuses are identified.  CT CERVICAL SPINE FINDINGS  There is no acute fracture or dislocation. Degenerative joint changes of the spine are identified. The prevertebral soft tissues are normal. There is a 1.4 x 0.7 cm lytic lesion in the anterior left C1.  IMPRESSION: Right frontal/ parietal scalp swelling and hematoma. No intracranial hemorrhage is identified.  No acute fracture or dislocation of cervical spine.   Electronically Signed   By: Abelardo Diesel M.D.   On: 10/17/2014 10:19   Dg Hip Port Unilat With Pelvis 1v Right  10/17/2014   CLINICAL DATA:  81 year old male with fall on right hip this morning with right hip pain. Initial encounter.  EXAM: RIGHT HIP (WITH PELVIS) 1 VIEW PORTABLE  COMPARISON:  None.  FINDINGS: There is no evidence of hip fracture or dislocation. There is no evidence of arthropathy or other focal bone abnormality.  IMPRESSION: Negative.   Electronically Signed   By: Margarette Canada M.D.   On: 10/17/2014 10:34    [id number] items are noted in HPI.  Vital Signs: BP 127/56 mmHg  Pulse 55  Temp(Src) 98.5 F (36.9 C) (Oral)  Resp 15  Wt 73.483 kg (162 lb)  SpO2 100%  Physical Exam: General appearance: alert, cooperative and appears stated age Head: Normocephalic, without obvious  abnormality, Right temporal ecchymosis with 6 cm laceration down to temporalis muscle.   Eyes: conjunctivae/corneas clear. PERRL, EOM's intact. Fundi benign. Ears: normal TM's and external ear canals both ears Nose: Nares normal. Septum midline. Mucosa normal. No drainage or sinus tenderness. Throat: lips, mucosa, and tongue normal; teeth and gums normal, the patient has dentures in place. There is also dried blood in the right ear.  The patient does not have any step-offs or evidence of facial fractures.  Hemorrhage at the wound site is under control upon  my examination.  Assessment/Plan: Mr. Hirano is s/p GLF from bed with a linear 6 cm laceration deep to subcutaneous tissue in the right temporal region.    1. The wound was dressed upon my arrival to the ER.  The wound had been cleaned and dressed with hemostatic dressing by ER staff. 2.  The patient had been anesthetized with local by the ER staff. Thank you.  3.  Several 3-0 vicryl sutures were placed to closed the deep tissues. 4. Then, 4-0 prolene was used to closed the skin.   5. I recommend wound care with bacitracin tid 6. Ancef or Keflex for 7 days.   7.  Suture removal in my office in 7 to 10 days.  8. The Hospitalist was present at the bedside to admit patient for a Syncope work-up.    [place],Iris Tatsch L  10/17/2014, 11:20 AM

## 2014-10-17 NOTE — ED Notes (Signed)
Wound continues to bleed through dressing, MD aware, suture cart and further supplies to bedside for MD

## 2014-10-17 NOTE — ED Notes (Signed)
Pt to CT, dressing saturated, MD aware and states to leave as is at this time, will re-address when patient returns.

## 2014-10-18 ENCOUNTER — Inpatient Hospital Stay (HOSPITAL_COMMUNITY): Payer: Medicare Other

## 2014-10-18 ENCOUNTER — Encounter (HOSPITAL_COMMUNITY): Payer: Self-pay | Admitting: General Practice

## 2014-10-18 ENCOUNTER — Ambulatory Visit: Payer: Medicare Other

## 2014-10-18 ENCOUNTER — Telehealth: Payer: Self-pay | Admitting: *Deleted

## 2014-10-18 DIAGNOSIS — I2510 Atherosclerotic heart disease of native coronary artery without angina pectoris: Secondary | ICD-10-CM

## 2014-10-18 DIAGNOSIS — I255 Ischemic cardiomyopathy: Secondary | ICD-10-CM

## 2014-10-18 DIAGNOSIS — D62 Acute posthemorrhagic anemia: Secondary | ICD-10-CM

## 2014-10-18 DIAGNOSIS — R55 Syncope and collapse: Secondary | ICD-10-CM

## 2014-10-18 LAB — CBC
HCT: 20.5 % — ABNORMAL LOW (ref 39.0–52.0)
HEMATOCRIT: 23.8 % — AB (ref 39.0–52.0)
Hemoglobin: 6.2 g/dL — CL (ref 13.0–17.0)
Hemoglobin: 7.5 g/dL — ABNORMAL LOW (ref 13.0–17.0)
MCH: 28.4 pg (ref 26.0–34.0)
MCH: 29.1 pg (ref 26.0–34.0)
MCHC: 30.2 g/dL (ref 30.0–36.0)
MCHC: 31.5 g/dL (ref 30.0–36.0)
MCV: 94 fL (ref 78.0–100.0)
MCV: 92.2 fL (ref 78.0–100.0)
PLATELETS: 364 10*3/uL (ref 150–400)
Platelets: 260 10*3/uL (ref 150–400)
RBC: 2.18 MIL/uL — ABNORMAL LOW (ref 4.22–5.81)
RBC: 2.58 MIL/uL — AB (ref 4.22–5.81)
RDW: 19.5 % — ABNORMAL HIGH (ref 11.5–15.5)
RDW: 18.7 % — ABNORMAL HIGH (ref 11.5–15.5)
WBC: 5.1 10*3/uL (ref 4.0–10.5)
WBC: 7.7 10*3/uL (ref 4.0–10.5)

## 2014-10-18 LAB — LIPID PANEL
Cholesterol: 89 mg/dL (ref 0–200)
HDL: 19 mg/dL — ABNORMAL LOW (ref >40)
LDL Cholesterol: 43 mg/dL (ref 0–99)
Total CHOL/HDL Ratio: 4.7 RATIO
Triglycerides: 137 mg/dL (ref <150)
VLDL: 27 mg/dL (ref 0–40)

## 2014-10-18 LAB — GLUCOSE, CAPILLARY: GLUCOSE-CAPILLARY: 120 mg/dL — AB (ref 65–99)

## 2014-10-18 LAB — PREPARE RBC (CROSSMATCH)

## 2014-10-18 LAB — TROPONIN I: Troponin I: <0.03 ng/mL (ref <0.031)

## 2014-10-18 MED ORDER — CEPHALEXIN 250 MG PO CAPS
250.0000 mg | ORAL_CAPSULE | Freq: Three times a day (TID) | ORAL | Status: DC
  Administered 2014-10-18 – 2014-10-21 (×10): 250 mg via ORAL
  Filled 2014-10-18 (×12): qty 1

## 2014-10-18 MED ORDER — SODIUM CHLORIDE 0.9 % IV SOLN
Freq: Once | INTRAVENOUS | Status: AC
  Administered 2014-10-18: 09:00:00 via INTRAVENOUS

## 2014-10-18 NOTE — Telephone Encounter (Signed)
[place], Think he is scheduled for 5/24 PM which is my call day Needs to be moved to another day

## 2014-10-18 NOTE — Progress Notes (Signed)
Patient Name: [name] Date of Encounter: 10/18/2014  Primary Cardiologist: Dr. Aundra Dubin   Principal Problem:   Syncope and collapse Active Problems:   CAD (coronary artery disease)   Essential thrombocythemia   Anemia due to chronic renal failure treated with erythropoietin   Ischemic cardiomyopathy   Essential hypertension   CKD (chronic kidney disease), stage IV   Arteriovenous malformation of small intestine   Acute blood loss anemia   Laceration of parietal lobe   Syncope   New right bundle branch block (RBBB)    SUBJECTIVE  Denies any CP or SOB.   CURRENT MEDS . sodium chloride   Intravenous Once  . anagrelide  1 mg Oral Daily  . cloNIDine  0.3 mg Oral TID  . docusate sodium  100 mg Oral BID  . hydrALAZINE  25 mg Oral TID  . metoprolol succinate  50 mg Oral Daily  . multivitamin with minerals   Oral Daily  . pantoprazole  40 mg Oral BID  . sodium chloride  3 mL Intravenous Q12H  . tamsulosin  0.4 mg Oral QPC supper    OBJECTIVE  Filed Vitals:   10/18/14 0243 10/18/14 0658 10/18/14 0700 10/18/14 0702  BP: 152/64 146/72 160/58 172/84  Pulse:      Temp: 97.3 F (36.3 C) 97.4 F (36.3 C)    TempSrc: Oral Oral    Resp: 18 20    Height:      Weight:  161 lb 9.5 oz (73.297 kg)    SpO2: 100% 100%      Intake/Output Summary (Last 24 hours) at 10/18/14 0806 Last data filed at 10/18/14 0600  Gross per 24 hour  Intake 1756.25 ml  Output   1652 ml  Net 104.25 ml   Filed Weights   10/17/14 0851 10/18/14 0658  Weight: 162 lb (73.483 kg) 161 lb 9.5 oz (73.297 kg)    PHYSICAL EXAM  General: Pleasant, NAD. Neuro: Alert and oriented X 3. Moves all extremities spontaneously. Psych: Normal affect. HEENT:  Normal. R forehead laceration  Neck: Supple without bruits or JVD. Lungs:  Resp regular and unlabored, CTA. Heart: RRR no s3, s4, or murmurs. Abdomen: Soft, non-tender, non-distended, BS + x 4.  Extremities: No clubbing, cyanosis or edema.  DP/PT/Radials 2+ and equal bilaterally.  Accessory Clinical Findings  CBC  Recent Labs  10/17/14 0850  10/17/14 2300 10/18/14 0735  WBC 9.0  < > 5.9 5.1  NEUTROABS 6.5  --   --   --   HGB 8.9*  < > 6.2* 6.2*  HCT 29.7*  < > 20.0* 20.5*  MCV 94.0  < > 93.5 94.0  PLT 504*  < > 320 260  < > = values in this interval not displayed. Basic Metabolic Panel  Recent Labs  10/17/14 0850 10/17/14 0914  NA 137 137  K 5.1 5.1  CL 106 107  CO2 19*  --   GLUCOSE 135* 133*  BUN 98* 97*  CREATININE 2.91* 2.80*  CALCIUM 8.7*  --    Liver Function Tests  Recent Labs  10/17/14 0850  AST 28  ALT 15*  ALKPHOS 68  BILITOT 0.6  PROT 5.8*  ALBUMIN 3.2*   No results for input(s): LIPASE, AMYLASE in the last 72 hours. Cardiac Enzymes  Recent Labs  10/17/14 1232 10/17/14 1820 10/18/14 0045  TROPONINI <0.03 <0.03 <0.03   Fasting Lipid Panel  Recent Labs  10/18/14 0045  CHOL 89  HDL 19*  LDLCALC 43  TRIG 137  CHOLHDL 4.7    TELE NSR with HR 50-60s, no significant ventricular ectopy    ECG  No new EKG  Echocardiogram  pending    Radiology/Studies  X-ray Chest Pa And Lateral  10/17/2014   CLINICAL DATA:  Syncope.  Fall.  Initial encounter.  EXAM: CHEST  2 VIEW  COMPARISON:  01/14/2013 and prior chest radiographs  FINDINGS: Cardiomegaly and CABG changes noted.  Mild pulmonary vascular congestion is identified.  Bilateral lower lobe opacities noted -question atelectasis versus airspace disease.  There is no evidence of pulmonary edema, suspicious pulmonary nodule/mass, pleural effusion, or pneumothorax. No acute bony abnormalities are identified.  IMPRESSION: Bilateral lower lobe opacities -question atelectasis versus airspace disease/pneumonia.  Cardiomegaly and mild pulmonary vascular congestion.       ASSESSMENT AND PLAN  81 year old gentleman with history of HTN, CAD s/p CABG 2006 and chronic kidney disease stage IV admitted after syncopal episode at home.  He suffered a large laceration of the right temporal area.  1. Syncope with unclear etiology  - BP Lying 146/72 HR 72. Sitting 160/58 HR 68. Standing 172/84 HR 72  - no obvious orthostatic hypotension. Serial trop negative  - treat anemia, pending carotid U/S and echo, will consider 30 day event monitor if negative  2. Acute on chronic anemia: hgb 10.2 on arrival --> 7.7 --> 6.2 this morning  - anemia workup for IM, likely result of R scalp laceration, PRBC transfusion given significant drop  3. Intermittent RBBB  - no RBBB seen on telemetry, however EKG showed widened QRS  - agree with lower home metoprolol XL from 100mg  BID to 50mg  daily  4. CAD s/p CABG 2006 5. ICM with baseline EF 45-50% on echo in 07/2011 6. HTN 7. CKD stage IV   Weston Brass Woodward Ku Pager: [phone]  The patient was seen, examined and discussed with Almyra Deforest, PA-C and I agree with the above.   81 year old male admitted with syncope, now with worsening anemia, on transfusion with PRBCs. No obvious reason for syncope found, telemetry is unremarkable. We will consider outpatient e-cardio monitor.  He appears euvolemic, we will monitor especially after receiving transfusions.   Dorothy Spark 10/18/2014

## 2014-10-18 NOTE — Telephone Encounter (Signed)
TC from pt's wife, [name]. She states that patient has been admitted to [facility] as of 10/17/14 s/p fall, scalp laceration and new bundle branch block. He is in rm # [id number]  Mrs. Dendinger wanted Dr. Benay Spice to know. And to cancel today's appt.

## 2014-10-18 NOTE — Evaluation (Signed)
Occupational Therapy Evaluation Patient Details Name: [name] MRN: [id number] DOB: 10/04/1931 Today's Date: 10/18/2014    History of Present Illness 81 year old male with a history of coronary artery disease, left renal vein, thrombocytosis, CKD stage IV, hypertension presented after a syncopal episode on the morning of admission. The patient's daughter was at bedside supplementing history.The patient lost consciousness for a period of one to 2 minutes. The patient had profuse bleeding from a laceration on his scalp. His wife helped the patient to sit out on the porch. His mentation was back to baseline after he regained consciousness. In the emergency department, his scalp laceration was repaired. Workup revealed new right bundle branch block on EKG. CT of the brain and cervical spine were negative for any acute findings.    Clinical Impression   Pt admitted with above. He demonstrates the below listed deficits and will benefit from continued OT to maximize safety and independence with BADLs.  Pt presents to OT with ataxia and generalized weakness.   Currently, he requires min - mod A for ADLs, was mod I PTA.  Feel he would benefit from CIR      Follow Up Recommendations  CIR    Equipment Recommendations  3 in 1 bedside comode    Recommendations for Other Services Rehab consult     Precautions / Restrictions Precautions Precautions: Fall Restrictions Weight Bearing Restrictions: No      Mobility Bed Mobility Overal bed mobility: Needs Assistance Bed Mobility: Supine to Sit     Supine to sit: Min assist     General bed mobility comments: Needed assist for elevation of trunk.  Pt with ataxic trunk and extremities making movement slightly more difficult as pt with poor coordination of movement.    Transfers Overall transfer level: Needs assistance Equipment used: Rolling walker (2 wheeled) Transfers: Sit to/from Stand Sit to Stand: Mod assist;+2 safety/equipment         General transfer comment: Pt needed asssit to power up with ataxic movement of LEs and trunk.  Pt unsteady needing steadying assist.  Took incr time for pt to achieve balance.      Balance Overall balance assessment: Needs assistance Sitting-balance support: No upper extremity supported Sitting balance-Leahy Scale: Fair Sitting balance - Comments: Pt can sit EOB without UE support.  Postural control: Posterior lean Standing balance support: Bilateral upper extremity supported Standing balance-Leahy Scale: Poor Standing balance comment: Pt requires UE suppport for balance.  When no UE support, pt losing balance posterior with postural extension truncal ataxia.  Pt states he has had this abnormal postural reactions before but they are now more heightened/worse.                              ADL Overall ADL's : Needs assistance/impaired Eating/Feeding: Modified independent;Sitting   Grooming: Wash/dry hands;Wash/dry face;Brushing hair;Minimal assistance;Standing   Upper Body Bathing: Minimal assitance;Sitting   Lower Body Bathing: Sit to/from stand;Moderate assistance   Upper Body Dressing : Minimal assistance;Sitting   Lower Body Dressing: Moderate assistance;Sit to/from stand   Toilet Transfer: Minimal assistance;+2 for physical assistance;RW;Ambulation;Comfort height toilet;Grab bars   Toileting- Clothing Manipulation and Hygiene: Moderate assistance;Sit to/from stand       Functional mobility during ADLs: Minimal assistance;+2 for physical assistance;Rolling walker General ADL Comments: Pt with ataxia     Vision Vision Assessment?: Yes Ocular Range of Motion: Within Functional Limits Tracking/Visual Pursuits: Able to track stimulus in all quads without  difficulty   Perception     Praxis      Pertinent Vitals/Pain Pain Assessment: No/denies pain     Hand Dominance     Extremity/Trunk Assessment Upper Extremity Assessment Upper Extremity  Assessment: RUE deficits/detail;LUE deficits/detail RUE Deficits / Details: full AROM dysmetria noted  RUE Coordination: decreased gross motor LUE Deficits / Details: full AROM dysmetria noted  LUE Coordination: decreased gross motor   Lower Extremity Assessment Lower Extremity Assessment: Defer to PT evaluation RLE Deficits / Details: grossly 2+/5 RLE Coordination: decreased fine motor;decreased gross motor LLE Deficits / Details: grossly 2+5 LLE Coordination: decreased fine motor;decreased gross motor   Cervical / Trunk Assessment Cervical / Trunk Assessment: Kyphotic;Other exceptions Cervical / Trunk Exceptions: trunk ataxia noted    Communication Communication Communication: No difficulties   Cognition Arousal/Alertness: Awake/alert Behavior During Therapy: WFL for tasks assessed/performed Overall Cognitive Status: Impaired/Different from baseline Area of Impairment: Safety/judgement;Awareness;Problem solving         Safety/Judgement: Decreased awareness of safety;Decreased awareness of deficits Awareness: Intellectual Problem Solving: Difficulty sequencing;Requires verbal cues;Requires tactile cues General Comments: Pt unaware of his balance issues.  Needed cuing for safety with balance.     General Comments       Exercises Exercises: General Lower Extremity     Shoulder Instructions      Home Living Family/patient expects to be discharged to:: Private residence Living Arrangements: Spouse/significant other Available Help at Discharge: Family;Available 24 hours/day (supervision only, wife and handicapped son) Type of Home: House Home Access: Stairs to enter [employer] of Steps: 2 Entrance Stairs-Rails: Left Home Layout: One level     Bathroom Shower/Tub: Teacher, early years/pre: Standard     Home Equipment: Walker - standard;Cane - single point;Bedside commode;Shower seat;Other (comment) (stationary bike, tub/shower)   Additional  Comments: Pt states son cannot help him but he doesn't have to help the son other than they supervise and remind him of things.  His wife can provide supervision only.        Prior Functioning/Environment Level of Independence: Independent with assistive device(s);Needs assistance  Gait / Transfers Assistance Needed: used cane at home when going long distances per pt.  asked if he had balance problems before and pt states no. ADL's / Homemaking Assistance Needed: independent        OT Diagnosis: Generalized weakness   OT Problem List: Decreased strength;Decreased activity tolerance;Impaired balance (sitting and/or standing);Decreased coordination;Decreased safety awareness;Decreased knowledge of use of DME or AE;Impaired UE functional use   OT Treatment/Interventions: Self-care/ADL training;Neuromuscular education;DME and/or AE instruction;Therapeutic activities;Cognitive remediation/compensation;Patient/family education;Balance training    OT Goals(Current goals can be found in the care plan section) Acute Rehab OT Goals Patient Stated Goal: to go home OT Goal Formulation: With patient Time For Goal Achievement: 10/25/14 Potential to Achieve Goals: Good ADL Goals Pt Will Perform Grooming: with supervision;standing Pt Will Perform Upper Body Bathing: with set-up;sitting Pt Will Perform Lower Body Bathing: with supervision;sit to/from stand Pt Will Perform Upper Body Dressing: with set-up;sitting Pt Will Perform Lower Body Dressing: with supervision;sit to/from stand Pt Will Transfer to Toilet: with set-up;ambulating;regular height toilet;grab bars Pt Will Perform Toileting - Clothing Manipulation and hygiene: with supervision;sit to/from stand  OT Frequency: Min 2X/week   Barriers to D/C:            Co-evaluation PT/OT/SLP Co-Evaluation/Treatment: Yes Reason for Co-Treatment: For patient/therapist safety PT goals addressed during session: Mobility/safety with mobility OT  goals addressed during session: ADL's and self-care  End of Session Equipment Utilized During Treatment: Rolling walker;Gait belt Nurse Communication: Mobility status  Activity Tolerance: Patient tolerated treatment well Patient left: in chair;with call bell/phone within reach;with chair alarm set   Time: [id number] OT Time Calculation (min): 29 min Charges:  OT General Charges $OT Visit: 1 Procedure OT Evaluation $Initial OT Evaluation Tier I: 1 Procedure G-Codes:    Lucille Passy M 2014-10-19, 4:06 PM

## 2014-10-18 NOTE — Evaluation (Signed)
Physical Therapy Evaluation Patient Details Name: [name] MRN: [id number] DOB: 1932/03/23 Today's Date: 10/18/2014   History of Present Illness  81 year old male with a history of coronary artery disease, left renal vein, thrombocytosis, CKD stage IV, hypertension presented after a syncopal episode on the morning of admission. The patient's daughter was at bedside supplementing history.The patient lost consciousness for a period of one to 2 minutes. The patient had profuse bleeding from a laceration on his scalp. His wife helped the patient to sit out on the porch. His mentation was back to baseline after he regained consciousness. In the emergency department, his scalp laceration was repaired. Workup revealed new right bundle branch block on EKG. CT of the brain and cervical spine were negative for any acute findings.   Clinical Impression  Pt admitted with above diagnosis. Pt currently with functional limitations due to the deficits listed below (see PT Problem List). Pt with significant LE, UE and truncal ataxia.  Pt states that this has gotten progressively worse.  OT notified MD regarding pts symptoms as pt with poor coordination and appears to have a decline in motor ability upon this admission.  Pt also weaker on the right than left as well.  Feel that pt will benefit from a REhab admit to maximize function prior to d/c home.  Will follow acutely.   Pt will benefit from skilled PT to increase their independence and safety with mobility to allow discharge to the venue listed below.      Follow Up Recommendations CIR;Supervision/Assistance - 24 hour    Equipment Recommendations  Other (comment) (TBA)    Recommendations for Other Services Rehab consult     Precautions / Restrictions Precautions Precautions: Fall Restrictions Weight Bearing Restrictions: No      Mobility  Bed Mobility Overal bed mobility: Needs Assistance Bed Mobility: Supine to Sit     Supine to sit: Min  assist     General bed mobility comments: Needed assist for elevation of trunk.  Pt with ataxic trunk and extremities making movement slightly more difficult as pt with poor coordination of movement.    Transfers Overall transfer level: Needs assistance Equipment used: Rolling walker (2 wheeled) Transfers: Sit to/from Stand Sit to Stand: Mod assist;+2 safety/equipment         General transfer comment: Pt needed asssit to power up with ataxic movement of LEs and trunk.  Pt unsteady needing steadying assist.  Took incr time for pt to achieve balance.    Ambulation/Gait Ambulation/Gait assistance: Mod assist;+2 physical assistance Ambulation Distance (Feet): 24 Feet (12 feet x 2) Assistive device: Rolling walker (2 wheeled) Gait Pattern/deviations: Step-through pattern;Decreased stride length;Decreased step length - right;Decreased step length - left;Decreased dorsiflexion - right;Decreased dorsiflexion - left;Ataxic;Trunk flexed;Wide base of support;Leaning posteriorly   Gait velocity interpretation: Below normal speed for age/gender General Gait Details: Pt ambulated with RW with unsteady gait due to LE and truncal ataxia.  Pt able to ambulate with RW with min assist overall of 2 people but once RW removed, pt with significant sway with pt overcorrecting with extensionwith poor postural stability overall.    Stairs            Wheelchair Mobility    Modified Rankin (Stroke Patients Only)       Balance Overall balance assessment: Needs assistance;History of Falls Sitting-balance support: No upper extremity supported;Feet supported Sitting balance-Leahy Scale: Fair Sitting balance - Comments: Pt can sit EOB without UE support.  Postural control: Posterior lean Standing balance  support: Bilateral upper extremity supported;No upper extremity supported;During functional activity Standing balance-Leahy Scale: Poor Standing balance comment: Pt requires UE suppport for balance.   When no UE support, pt losing balance posterior with postural extension truncal ataxia.  Pt states he has had this abnormal postural reactions before but they are now more heightened/worse.                               Pertinent Vitals/Pain Pain Assessment: No/denies pain  O2 sats dropped to 87% on RA.  Had to place O2 at 2LO2 back on to keep sats >90%.      Home Living Family/patient expects to be discharged to:: Private residence Living Arrangements: Spouse/significant other Available Help at Discharge: Family;Available 24 hours/day (supervision only, wife and handicapped son) Type of Home: House Home Access: Stairs to enter Entrance Stairs-Rails: Left Entrance Stairs-Number of Steps: 2 Home Layout: One level Home Equipment: Walker - standard;Cane - single point;Bedside commode;Shower seat;Other (comment) (stationary bike, tub/shower) Additional Comments: Pt states son cannot help him but he doesn't have to help the son other than they supervise and remind him of things.  His wife can provide supervision only.      Prior Function Level of Independence: Independent with assistive device(s);Needs assistance   Gait / Transfers Assistance Needed: used cane at home when going long distances per pt.  asked if he had balance problems before and pt states no.  ADL's / Homemaking Assistance Needed: independent        Hand Dominance        Extremity/Trunk Assessment   Upper Extremity Assessment: Defer to OT evaluation           Lower Extremity Assessment: RLE deficits/detail;LLE deficits/detail RLE Deficits / Details: grossly 2+/5 LLE Deficits / Details: grossly 2+5  Cervical / Trunk Assessment: Kyphotic  Communication   Communication: No difficulties  Cognition Arousal/Alertness: Awake/alert Behavior During Therapy: WFL for tasks assessed/performed Overall Cognitive Status: Impaired/Different from baseline Area of Impairment: Safety/judgement;Awareness;Problem  solving         Safety/Judgement: Decreased awareness of safety;Decreased awareness of deficits Awareness: Intellectual Problem Solving: Difficulty sequencing;Requires verbal cues;Requires tactile cues General Comments: Pt unaware of his balance issues.  Needed cuing for safety with balance.      General Comments      Exercises General Exercises - Lower Extremity Ankle Circles/Pumps: AROM;Both;5 reps;Supine Long Arc Quad: AROM;Both;5 reps;Seated      Assessment/Plan    PT Assessment Patient needs continued PT services  PT Diagnosis Generalized weakness;Abnormality of gait   PT Problem List Decreased activity tolerance;Decreased balance;Decreased mobility;Decreased knowledge of use of DME;Decreased safety awareness;Decreased knowledge of precautions;Decreased coordination  PT Treatment Interventions DME instruction;Gait training;Stair training;Functional mobility training;Therapeutic activities;Therapeutic exercise;Balance training;Patient/family education   PT Goals (Current goals can be found in the Care Plan section) Acute Rehab PT Goals Patient Stated Goal: to go home PT Goal Formulation: With patient Time For Goal Achievement: 11/01/14 Potential to Achieve Goals: Good    Frequency Min 3X/week   Barriers to discharge Decreased caregiver support      Co-evaluation PT/OT/SLP Co-Evaluation/Treatment: Yes Reason for Co-Treatment: For patient/therapist safety PT goals addressed during session: Mobility/safety with mobility         End of Session Equipment Utilized During Treatment: Gait belt;Oxygen Activity Tolerance: Patient limited by fatigue Patient left: in chair;with call bell/phone within reach;with chair alarm set Nurse Communication: Mobility status  Time: 4268-3419 PT Time Calculation (min) (ACUTE ONLY): 35 min   Charges:   PT Evaluation $Initial PT Evaluation Tier I: 1 Procedure PT Treatments $Gait Training: 8-22 mins   PT G CodesDenice Paradise 11-Nov-2014, 2:55 PM  [employer] [facility] Acute [facility] [phone] (pager)

## 2014-10-18 NOTE — Care Management Note (Signed)
Case Management Note  Patient Details  Name: [name] MRN: [id number] Date of Birth: 05-05-1932  Subjective/Objective:                 Pt admitted on 10/17/14 with syncope with fall, anemia.  PTA, pt resided at home with spouse.  Action/Plan: Will follow for discharge planning as pt progresses.  PT/OT recommending CIR.  Rehab consult pending.    Expected Discharge Date:                  Expected Discharge Plan:  [place]  In-House Referral:     Discharge planning Services  CM Consult  Post Acute Care Choice:    Choice offered to:     DME Arranged:    DME Agency:     HH Arranged:    [place] Agency:     Status of Service:  In process, will continue to follow  Medicare Important Message Given:    Date Medicare IM Given:    Medicare IM give by:    Date Additional Medicare IM Given:    Additional Medicare Important Message give by:     If discussed at [place] of Stay Meetings, dates discussed:    Additional Comments:  Ella Bodo, RN 10/18/2014, 4:36 PM Phone [phone]

## 2014-10-18 NOTE — Progress Notes (Signed)
Notified Dr. Waldron Labs of HgB results after pt received 1 unit RBCs:  From 6.2 to 7.5.

## 2014-10-18 NOTE — Progress Notes (Signed)
Rehab Admissions Coordinator Note:  Patient was screened by Cleatrice Burke for appropriateness for an Inpatient Acute Rehab Consult per PT recommendation.  At this time, we are recommending Inpatient Rehab consult. Please place order if pt fails to progress. Noted therapy noted decline.  Cleatrice Burke 10/18/2014, 3:03 PM  I can be reached at [phone].

## 2014-10-18 NOTE — Progress Notes (Signed)
Patient Demographics  [name], is a 81 y.o. male, DOB - 05/27/1932, ZTI:[id number]  Admit date - 10/17/2014   Admitting Physician Orson Eva, MD  Outpatient Primary MD for the patient is [name], MD  LOS - 1   Chief Complaint  Patient presents with  . Fall  . Head Injury       Admission HPI/Brief narrative: 81 year old male with a history of coronary artery disease, left renal vein, thrombocytosis, CKD stage IV, hypertension presented after a syncopal episode on the morning of admission. The patient's daughter was at bedside supplementing history.The patient lost consciousness for a period of one to 2 minutes. The patient had profuse bleeding from a laceration on his scalp. His wife helped the patient to sit out on the porch. His mentation was back to baseline after he regained consciousness. In the emergency department, his scalp laceration was repaired. Workup revealed new right bundle branch block on EKG. CT of the brain and cervical spine were negative for any acute findings.   Subjective:   [name] today has, No headache, No chest pain, No abdominal pain - No Nausea, No new weakness tingling or numbness, No Cough - SOB.   Assessment & Plan    Principal Problem:   Syncope and collapse Active Problems:   CAD (coronary artery disease)   Essential thrombocythemia   Anemia due to chronic renal failure treated with erythropoietin   Ischemic cardiomyopathy   Essential hypertension   CKD (chronic kidney disease), stage IV   Arteriovenous malformation of small intestine   Acute blood loss anemia   Laceration of parietal lobe   Syncope   New right bundle branch block (RBBB)  Syncope - Unclear etiology, has new right bundle branch block,  patient is not orthostatic, CT head is negative,  - Serial troponins are negative - Patient was found to have anemia -  carotid ultrasound  pending - 2-D echo pending - MRI brain pending - No significant events on telemetry  - Decrease home dose metoprolol  Acute blood loss anemia from head laceration and bleed. -Hemoglobin has dropped from 10 to 6.2 -Will transfuse 1 unit packed red blood cell - Check Hemoccult stool, he denies any dark-colored stools or bright red blood per rectum. - Continue with Keflex for laceration  Lateral temporoparietal laceration -Sutures and routine wound care   CAD/ New Right Bundle branch block  - Serial enzymes are negative - Continue consult appreciated - Denies any complaints of chest pain   Anemia due to chronic renal failure treated with erythropoietin -see acute blood loss anemia as above   Ischemic cardiomyopathy -Last known EF 45% in 2013 -Follow on 2-D echo - No evidence of volume overload    Essential thrombocythemia -Continue preadmission medications   Essential hypertension - Decrease home dose Toprol from 100 mg twice a day to 50 mg oral daily , as well decrease home Coumadin dose .    CKD (chronic kidney disease), stage IV - Continue to monitor   Arteriovenous malformation of small intestine -Currently no signs of active GI bleeding   Code Status: Full  Family Communication: None at bedside  Disposition Plan: Pending PT evaluation   Procedures  1 unit packed red blood cell transfusion  10/18/14 Suturing of laceration and head 10/17/14   Consults   Surgery Cardiology   Medications  Scheduled Meds: . anagrelide  1 mg Oral Daily  . cloNIDine  0.3 mg Oral TID  . docusate sodium  100 mg Oral BID  . hydrALAZINE  25 mg Oral TID  . metoprolol succinate  50 mg Oral Daily  . multivitamin with minerals   Oral Daily  . pantoprazole  40 mg Oral BID  . sodium chloride  3 mL Intravenous Q12H  . tamsulosin  0.4 mg Oral QPC supper   Continuous Infusions: . sodium chloride 75 mL/hr at 10/17/14 1123   PRN Meds:.acetaminophen **OR** acetaminophen,  acetaminophen-codeine, morphine injection, ondansetron **OR** ondansetron (ZOFRAN) IV, oxyCODONE  DVT Prophylaxis   SCDs and his anemia and significant bleed from laceration.  Lab Results  Component Value Date   PLT 260 10/18/2014    Antibiotics   Anti-infectives    Start     Dose/Rate Route Frequency Ordered Stop   10/17/14 1341  acyclovir (ZOVIRAX) 200 MG capsule 200 mg  Status:  Discontinued     200 mg Oral As directed 10/17/14 1341 10/17/14 1423          Objective:   Filed Vitals:   10/18/14 0702 10/18/14 0841 10/18/14 0856 10/18/14 1130  BP: 172/84 142/60 148/55 142/51  Pulse:  70 93 70  Temp:  98.8 F (37.1 C) 98.8 F (37.1 C) 98 F (36.7 C)  TempSrc:  Oral Oral Oral  Resp:  19 20 19   Height:      Weight:      SpO2:  93% 93% 100%    Wt Readings from Last 3 Encounters:  10/18/14 73.297 kg (161 lb 9.5 oz)  09/22/14 73.483 kg (162 lb)  09/13/14 73.437 kg (161 lb 14.4 oz)     Intake/Output Summary (Last 24 hours) at 10/18/14 1239 Last data filed at 10/18/14 1130  Gross per 24 hour  Intake 2612.25 ml  Output   1652 ml  Net 960.25 ml     Physical Exam  Awake Alert, right head laceration, with dried blood, active oozing [place].AT,PERRAL Supple Neck,No JVD, No cervical lymphadenopathy appriciated.  Symmetrical Chest wall movement, Good air movement bilaterally,  RRR,No Gallops,Rubs, No Parasternal Heave +ve B.Sounds, Abd Soft, No tenderness, No organomegaly appriciated, No rebound - guarding or rigidity. No Cyanosis, no edema, pedal pulses felt laterally.   Data Review   Micro Results Recent Results (from the past 240 hour(s))  Clostridium Difficile by PCR     Status: None   Collection Time: 10/17/14  5:37 PM  Result Value Ref Range Status   C difficile by pcr NEGATIVE NEGATIVE Final    Radiology Reports X-ray Chest Pa And Lateral  10/17/2014   CLINICAL DATA:  Syncope.  Fall.  Initial encounter.  EXAM: CHEST  2 VIEW  COMPARISON:  01/14/2013 and  prior chest radiographs  FINDINGS: Cardiomegaly and CABG changes noted.  Mild pulmonary vascular congestion is identified.  Bilateral lower lobe opacities noted -question atelectasis versus airspace disease.  There is no evidence of pulmonary edema, suspicious pulmonary nodule/mass, pleural effusion, or pneumothorax. No acute bony abnormalities are identified.  IMPRESSION: Bilateral lower lobe opacities -question atelectasis versus airspace disease/pneumonia.  Cardiomegaly and mild pulmonary vascular congestion.   Electronically Signed   By: Margarette Canada M.D.   On: 10/17/2014 16:32   Ct Head Wo Contrast  10/17/2014   CLINICAL DATA:  Status post fall on the bathroom with laceration to  the right parietal and occipital region. Patient has a history of prostate cancer.  EXAM: CT HEAD WITHOUT CONTRAST  CT CERVICAL SPINE WITHOUT CONTRAST  TECHNIQUE: Multidetector CT imaging of the head and cervical spine was performed following the standard protocol without intravenous contrast. Multiplanar CT image reconstructions of the cervical spine were also generated.  COMPARISON:  MR brain August 26, 2013  FINDINGS: CT HEAD FINDINGS  There is chronic diffuse atrophy. There is no midline shift, hydrocephalus, or mass. No acute hemorrhage or acute transcortical infarct is identified. The bony calvarium is intact. There is right frontal parietal scalp swelling and hematoma. Complete opacity of bilateral maxillary sinuses are identified.  CT CERVICAL SPINE FINDINGS  There is no acute fracture or dislocation. Degenerative joint changes of the spine are identified. The prevertebral soft tissues are normal. There is a 1.4 x 0.7 cm lytic lesion in the anterior left C1.  IMPRESSION: Right frontal/ parietal scalp swelling and hematoma. No intracranial hemorrhage is identified.  No acute fracture or dislocation of cervical spine.   Electronically Signed   By: Abelardo Diesel M.D.   On: 10/17/2014 10:19   Ct Cervical Spine Wo  Contrast  10/17/2014   CLINICAL DATA:  Status post fall on the bathroom with laceration to the right parietal and occipital region. Patient has a history of prostate cancer.  EXAM: CT HEAD WITHOUT CONTRAST  CT CERVICAL SPINE WITHOUT CONTRAST  TECHNIQUE: Multidetector CT imaging of the head and cervical spine was performed following the standard protocol without intravenous contrast. Multiplanar CT image reconstructions of the cervical spine were also generated.  COMPARISON:  MR brain August 26, 2013  FINDINGS: CT HEAD FINDINGS  There is chronic diffuse atrophy. There is no midline shift, hydrocephalus, or mass. No acute hemorrhage or acute transcortical infarct is identified. The bony calvarium is intact. There is right frontal parietal scalp swelling and hematoma. Complete opacity of bilateral maxillary sinuses are identified.  CT CERVICAL SPINE FINDINGS  There is no acute fracture or dislocation. Degenerative joint changes of the spine are identified. The prevertebral soft tissues are normal. There is a 1.4 x 0.7 cm lytic lesion in the anterior left C1.  IMPRESSION: Right frontal/ parietal scalp swelling and hematoma. No intracranial hemorrhage is identified.  No acute fracture or dislocation of cervical spine.   Electronically Signed   By: Abelardo Diesel M.D.   On: 10/17/2014 10:19   Dg Abd 2 Views  09/22/2014   CLINICAL DATA:  Assess for retained endoscopic camera capsule  EXAM: ABDOMEN - 2 VIEW  COMPARISON:  None.  FINDINGS: There is moderately increased stool burden throughout the colon. No radiopaque camera capsule is demonstrated. There is no small or large bowel obstructive pattern. There are degenerative changes of the lumbar spine with gentle dextrocurvature  IMPRESSION: No retained endoscopic capsule is demonstrated. Increase colonic stool burden may reflect clinical constipation.   Electronically Signed   By: David  Martinique M.D.   On: 09/22/2014 13:13   Dg Hip Port Unilat With Pelvis 1v  Right  10/17/2014   CLINICAL DATA:  81 year old male with fall on right hip this morning with right hip pain. Initial encounter.  EXAM: RIGHT HIP (WITH PELVIS) 1 VIEW PORTABLE  COMPARISON:  None.  FINDINGS: There is no evidence of hip fracture or dislocation. There is no evidence of arthropathy or other focal bone abnormality.  IMPRESSION: Negative.   Electronically Signed   By: Margarette Canada M.D.   On: 10/17/2014 10:34  CBC  Recent Labs Lab 10/11/14 1507 10/17/14 0850 10/17/14 0914 10/17/14 1125 10/17/14 2300 10/18/14 0735  WBC 5.0 9.0  --  11.0* 5.9 5.1  HGB 8.4* 8.9* 10.2* 7.7* 6.2* 6.2*  HCT 26.3* 29.7* 30.0* 24.4* 20.0* 20.5*  PLT 331 504*  --  391 320 260  MCV 88.9 94.0  --  94.2 93.5 94.0  MCH 28.4 28.2  --  29.7 29.0 28.4  MCHC 32.0 30.0  --  31.6 31.0 30.2  RDW 17.8* 19.4*  --  19.0* 19.2* 19.5*  LYMPHSABS 0.8* 1.3  --   --   --   --   MONOABS 0.5 0.9  --   --   --   --   EOSABS 0.3 0.3  --   --   --   --   BASOSABS 0.1 0.0  --   --   --   --     Chemistries   Recent Labs Lab 10/17/14 0850 10/17/14 0914  NA 137 137  K 5.1 5.1  CL 106 107  CO2 19*  --   GLUCOSE 135* 133*  BUN 98* 97*  CREATININE 2.91* 2.80*  CALCIUM 8.7*  --   AST 28  --   ALT 15*  --   ALKPHOS 68  --   BILITOT 0.6  --    ------------------------------------------------------------------------------------------------------------------ estimated creatinine clearance is 21.8 mL/min (by C-G formula based on Cr of 2.8). ------------------------------------------------------------------------------------------------------------------ No results for input(s): HGBA1C in the last 72 hours. ------------------------------------------------------------------------------------------------------------------  Recent Labs  10/18/14 0045  CHOL 89  HDL 19*  LDLCALC 43  TRIG 137  CHOLHDL 4.7    ------------------------------------------------------------------------------------------------------------------ No results for input(s): TSH, T4TOTAL, T3FREE, THYROIDAB in the last 72 hours.  Invalid input(s): FREET3 ------------------------------------------------------------------------------------------------------------------ No results for input(s): VITAMINB12, FOLATE, FERRITIN, TIBC, IRON, RETICCTPCT in the last 72 hours.  Coagulation profile  Recent Labs Lab 10/17/14 0850  INR 1.23    No results for input(s): DDIMER in the last 72 hours.  Cardiac Enzymes  Recent Labs Lab 10/17/14 1232 10/17/14 1820 10/18/14 0045  TROPONINI <0.03 <0.03 <0.03   ------------------------------------------------------------------------------------------------------------------ Invalid input(s): POCBNP     Time Spent in minutes   30 minutes    Maeci Kalbfleisch M.D on 10/18/2014 at 12:39 PM  Between 7am to 7pm - Pager - [phone]  After 7pm go to [email or web address] - password [facility]  [facility]   Office  [phone]

## 2014-10-18 NOTE — Progress Notes (Signed)
  Echocardiogram 2D Echocardiogram has been performed.  [name] 10/18/2014, 10:35 AM

## 2014-10-19 DIAGNOSIS — N189 Chronic kidney disease, unspecified: Secondary | ICD-10-CM

## 2014-10-19 DIAGNOSIS — R55 Syncope and collapse: Principal | ICD-10-CM

## 2014-10-19 DIAGNOSIS — R269 Unspecified abnormalities of gait and mobility: Secondary | ICD-10-CM

## 2014-10-19 DIAGNOSIS — W19XXXD Unspecified fall, subsequent encounter: Secondary | ICD-10-CM

## 2014-10-19 DIAGNOSIS — D509 Iron deficiency anemia, unspecified: Secondary | ICD-10-CM

## 2014-10-19 DIAGNOSIS — S0101XA Laceration without foreign body of scalp, initial encounter: Secondary | ICD-10-CM

## 2014-10-19 DIAGNOSIS — W19XXXA Unspecified fall, initial encounter: Secondary | ICD-10-CM

## 2014-10-19 DIAGNOSIS — D631 Anemia in chronic kidney disease: Secondary | ICD-10-CM

## 2014-10-19 DIAGNOSIS — D473 Essential (hemorrhagic) thrombocythemia: Secondary | ICD-10-CM

## 2014-10-19 LAB — DIC (DISSEMINATED INTRAVASCULAR COAGULATION)PANEL
D-Dimer, Quant: 0.86 ug/mL-FEU — ABNORMAL HIGH (ref 0.00–0.48)
Fibrinogen: 392 mg/dL (ref 204–475)
Platelets: 269 10*3/uL (ref 150–400)
Smear Review: NONE SEEN
aPTT: 30 seconds (ref 24–37)

## 2014-10-19 LAB — CBC
HCT: 19.7 % — ABNORMAL LOW (ref 39.0–52.0)
HCT: 20.3 % — ABNORMAL LOW (ref 39.0–52.0)
HEMOGLOBIN: 6.6 g/dL — AB (ref 13.0–17.0)
Hemoglobin: 6.2 g/dL — CL (ref 13.0–17.0)
MCH: 29.2 pg (ref 26.0–34.0)
MCH: 30 pg (ref 26.0–34.0)
MCHC: 31.5 g/dL (ref 30.0–36.0)
MCHC: 32.5 g/dL (ref 30.0–36.0)
MCV: 92.9 fL (ref 78.0–100.0)
MCV: 92.3 fL (ref 78.0–100.0)
Platelets: 256 10*3/uL (ref 150–400)
Platelets: 225 10*3/uL (ref 150–400)
RBC: 2.12 MIL/uL — AB (ref 4.22–5.81)
RBC: 2.2 MIL/uL — ABNORMAL LOW (ref 4.22–5.81)
RDW: 19.1 % — ABNORMAL HIGH (ref 11.5–15.5)
RDW: 18.1 % — AB (ref 11.5–15.5)
WBC: 4.9 10*3/uL (ref 4.0–10.5)
WBC: 4.2 10*3/uL (ref 4.0–10.5)

## 2014-10-19 LAB — HEMOGLOBIN A1C
HEMOGLOBIN A1C: 5.3 % (ref 4.8–5.6)
MEAN PLASMA GLUCOSE: 105 mg/dL

## 2014-10-19 LAB — RETICULOCYTES
RBC.: 2.64 MIL/uL — AB (ref 4.22–5.81)
RETIC CT PCT: 6.3 % — AB (ref 0.4–3.1)
Retic Count, Absolute: 166.3 10*3/uL (ref 19.0–186.0)

## 2014-10-19 LAB — PREPARE RBC (CROSSMATCH)

## 2014-10-19 LAB — HEPATIC FUNCTION PANEL
ALT: 16 U/L — AB (ref 17–63)
AST: 14 U/L — ABNORMAL LOW (ref 15–41)
Albumin: 2.8 g/dL — ABNORMAL LOW (ref 3.5–5.0)
Alkaline Phosphatase: 60 U/L (ref 38–126)
BILIRUBIN DIRECT: 0.1 mg/dL (ref 0.1–0.5)
BILIRUBIN INDIRECT: 0.6 mg/dL (ref 0.3–0.9)
BILIRUBIN TOTAL: 0.7 mg/dL (ref 0.3–1.2)
Total Protein: 4.7 g/dL — ABNORMAL LOW (ref 6.5–8.1)

## 2014-10-19 LAB — BASIC METABOLIC PANEL
ANION GAP: 10 (ref 5–15)
BUN: 82 mg/dL — AB (ref 6–20)
CALCIUM: 8 mg/dL — AB (ref 8.9–10.3)
CO2: 18 mmol/L — AB (ref 22–32)
CREATININE: 2.57 mg/dL — AB (ref 0.61–1.24)
Chloride: 111 mmol/L (ref 101–111)
GFR calc Af Amer: 26 mL/min — ABNORMAL LOW (ref >60)
GFR calc non Af Amer: 22 mL/min — ABNORMAL LOW (ref >60)
Glucose, Bld: 104 mg/dL — ABNORMAL HIGH (ref 65–99)
Potassium: 4.4 mmol/L (ref 3.5–5.1)
Sodium: 139 mmol/L (ref 135–145)

## 2014-10-19 LAB — DIC (DISSEMINATED INTRAVASCULAR COAGULATION) PANEL
INR: 1.26 (ref 0.00–1.49)
PROTHROMBIN TIME: 15.9 s — AB (ref 11.6–15.2)

## 2014-10-19 LAB — LACTIC ACID, PLASMA: Lactic Acid, Venous: 0.8 mmol/L (ref 0.5–2.0)

## 2014-10-19 LAB — GLUCOSE, CAPILLARY: Glucose-Capillary: 117 mg/dL — ABNORMAL HIGH (ref 65–99)

## 2014-10-19 MED ORDER — SODIUM CHLORIDE 0.9 % IV SOLN
Freq: Once | INTRAVENOUS | Status: AC
  Administered 2014-10-19: 13:00:00 via INTRAVENOUS

## 2014-10-19 MED ORDER — SODIUM CHLORIDE 0.9 % IV SOLN: Freq: Once | INTRAVENOUS | Status: DC

## 2014-10-19 NOTE — Progress Notes (Signed)
CRITICAL VALUE ALERT  Critical value received:  Hgb 6.2  Date of notification:  10/19/14  Time of notification:  1223  Critical value read back:Yes.    Nurse who received alert:  Kendrick Ranch  MD notified (1st page):  K. Schorr  Time of first page:  3  MD notified (2nd page):  Time of second page:  Responding MD:  awaiting  Time MD responded:  awaiting

## 2014-10-19 NOTE — Progress Notes (Signed)
[place]   DOB:06-Dec-1931   GY#:[id number]   XBL#:[id number]  Subjective: Pt is well known to me. He was admitted for syncope and scalp laceration, anemia with Hg 7.7 which further dropped to 6.2 on same day. He received 2u RBC.    Objective:  Filed Vitals:   10/19/14 2039  BP: 158/66  Pulse: 71  Temp: 97.5 F (36.4 C)  Resp: 18    Body mass index is 22.4 kg/(m^2).  Intake/Output Summary (Last 24 hours) at 10/19/14 2150 Last data filed at 10/19/14 2039  Gross per 24 hour  Intake 3683.75 ml  Output    900 ml  Net 2783.75 ml     Sclerae unicteric, (+) laceration and ecchymosis on right front scalp    Oropharynx clear  No peripheral adenopathy  Lungs clear -- no rales or rhonchi  Heart regular rate and rhythm  Abdomen benign  MSK no focal spinal tenderness, no peripheral edema  Neuro nonfocal   CBG (last 3)   Recent Labs  10/18/14 1148 10/19/14 0611  GLUCAP 120* 117*     Labs:  Lab Results  Component Value Date   WBC 4.2 10/19/2014   HGB 6.6* 10/19/2014   HCT 20.3* 10/19/2014   MCV 92.3 10/19/2014   PLT 269 10/19/2014   NEUTROABS 6.5 10/17/2014    _0 @  Urine Studies No results for input(s): UHGB, CRYS in the last 72 hours.  Invalid input(s): UACOL, UAPR, USPG, UPH, UTP, UGL, UKET, UBIL, UNIT, UROB, ULEU, UEPI, UWBC, Duwayne Heck [place], [place]  Basic Metabolic Panel:  Recent Labs Lab 10/17/14 0850 10/17/14 0914 10/19/14 0710  NA 137 137 139  K 5.1 5.1 4.4  CL 106 107 111  CO2 19*  --  18*  GLUCOSE 135* 133* 104*  BUN 98* 97* 82*  CREATININE 2.91* 2.80* 2.57*  CALCIUM 8.7*  --  8.0*   GFR Estimated Creatinine Clearance: 23.6 mL/min (by C-G formula based on Cr of 2.57). Liver Function Tests:  Recent Labs Lab 10/17/14 0850 10/19/14 1203  AST 28 14*  ALT 15* 16*  ALKPHOS 68 60  BILITOT 0.6 0.7  PROT 5.8* 4.7*  ALBUMIN 3.2* 2.8*   No results for input(s): LIPASE, AMYLASE in the last 168 hours. No results for input(s):  AMMONIA in the last 168 hours. Coagulation profile  Recent Labs Lab 10/17/14 0850 10/19/14 1203  INR 1.23 1.26    CBC:  Recent Labs Lab 10/17/14 0850  10/17/14 2300 10/18/14 0735 10/18/14 1245 10/18/14 2316 10/19/14 0710 10/19/14 1203  WBC 9.0  < > 5.9 5.1 7.7 4.9 4.2  --   NEUTROABS 6.5  --   --   --   --   --   --   --   HGB 8.9*  < > 6.2* 6.2* 7.5* 6.2* 6.6*  --   HCT 29.7*  < > 20.0* 20.5* 23.8* 19.7* 20.3*  --   MCV 94.0  < > 93.5 94.0 92.2 92.9 92.3  --   PLT 504*  < > 320 260 364 256 225 269  < > = values in this interval not displayed. Cardiac Enzymes:  Recent Labs Lab 10/17/14 1232 10/17/14 1820 10/18/14 0045  TROPONINI <0.03 <0.03 <0.03   BNP: Invalid input(s): POCBNP CBG:  Recent Labs Lab 10/18/14 1148 10/19/14 0611  GLUCAP 120* 117*   D-Dimer  Recent Labs  10/19/14 1203  DDIMER 0.86*   Hgb A1c  Recent Labs  10/18/14 0045  HGBA1C 5.3  Lipid Profile  Recent Labs  10/18/14 0045  CHOL 89  HDL 19*  LDLCALC 43  TRIG 137  CHOLHDL 4.7   Thyroid function studies No results for input(s): TSH, T4TOTAL, T3FREE, THYROIDAB in the last 72 hours.  Invalid input(s): FREET3 Anemia work up  Recent Labs  10/19/14 1203  RETICCTPCT 6.3*   Microbiology Recent Results (from the past 240 hour(s))  Clostridium Difficile by PCR     Status: None   Collection Time: 10/17/14  5:37 PM  Result Value Ref Range Status   C difficile by pcr NEGATIVE NEGATIVE Final      Studies:  No results found.  Assessment: 81 y.o. male with history of ET, anemia secondary to chronic renal disease and iron deficiency, gait disturbance, who was admitted for syncope and fall. He suffered significant bleeding from the laceration of the skull.  I was consulted for anemia    Plan:  -his anemia is likely multifactorial, including bleeding, anemia of chronic disease and iron deficiency. -He has long-standing history of ET, worsening anemia lately, I'm  concerned about his ET has progressed to myelofibrosis. I have discussed with the bone marrow biopsy with patient several times  in the clinic before, and he declined marrow biopsy. -He has a mild elevated reticulocyte count, normal bilirubin, haptoglobin still pending, hemolysis need to be ruled out, please check LDH. -I reviewed his peripheral blood smear, which showed polychromasia, (+) tear drop RBC, no schistocytes -Consider IV iron Feraheme 510 mg once -Blood transfusion to keep his hemoglobin above 8.5 -He is scheduled to see my colleague Dr. Learta Codding next week.    Truitt Merle, MD 10/19/2014  9:50 PM

## 2014-10-19 NOTE — Progress Notes (Signed)
Physical Therapy Treatment Patient Details Name: [name] MRN: [id number] DOB: 02-24-1932 Today's Date: 10/19/2014    History of Present Illness 81 year old male with a history of coronary artery disease, left renal vein, thrombocytosis, CKD stage IV, hypertension presented after a syncopal episode on the morning of admission. The patient's daughter was at bedside supplementing history.The patient lost consciousness for a period of one to 2 minutes. The patient had profuse bleeding from a laceration on his scalp. His wife helped the patient to sit out on the porch. His mentation was back to baseline after he regained consciousness. In the emergency department, his scalp laceration was repaired. Workup revealed new right bundle branch block on EKG. CT of the brain and cervical spine were negative for any acute findings.     PT Comments    Pt with decreased ataxia today with gait, but continues to have decreased balance with posterior tendency.  He tends to look at feet during gait for improved foot placement, but has difficulty when instructed to look ahead.  Con't to recommend CIR at this time.   Follow Up Recommendations  CIR;Supervision/Assistance - 24 hour     Equipment Recommendations  Other (comment) (to be determined)    Recommendations for Other Services       Precautions / Restrictions Precautions Precautions: Fall Restrictions Weight Bearing Restrictions: No    Mobility  Bed Mobility               General bed mobility comments: Pt up in chair upon arrival.  Transfers Overall transfer level: Needs assistance Equipment used: Rolling walker (2 wheeled) Transfers: Sit to/from Stand Sit to Stand: Min assist         General transfer comment: Slight posterior sway with standing.    Ambulation/Gait Ambulation/Gait assistance: +2 safety/equipment;Min assist Ambulation Distance (Feet): 100 Feet Assistive device: Rolling walker (2 wheeled) Gait  Pattern/deviations: Trunk flexed;Decreased step length - left;Decreased step length - right Gait velocity: decreased   General Gait Details: Pt with decreased ataxic gait today with 2 small episodes of increased R hip pain causing small LOB, but able to correct with MIN A.  When looking ahead, pt has more difficulty with foot/step placement. He does have posterior tendency but no overt posterior lean.   Stairs            Wheelchair Mobility    Modified Rankin (Stroke Patients Only)       Balance Overall balance assessment: Needs assistance         Standing balance support: Single extremity supported Standing balance-Leahy Scale: Poor Standing balance comment: Pt able to stand staticly without AD with slight posterior lean, but requires RW for dynamic activities.                    Cognition Arousal/Alertness: Awake/alert Behavior During Therapy: WFL for tasks assessed/performed Overall Cognitive Status: Within Functional Limits for tasks assessed           Safety/Judgement: Decreased awareness of deficits   Problem Solving: Requires verbal cues;Requires tactile cues General Comments: Pt reports feeling better than yesterday.    Exercises General Exercises - Lower Extremity Ankle Circles/Pumps: AROM;Both;5 reps;Seated    General Comments General comments (skin integrity, edema, etc.): Pt subjectively reports he is feeling better.  Pt with hgb of 6.6, but aysmptomatic.      Pertinent Vitals/Pain Pain Assessment: Faces Faces Pain Scale: Hurts little more Pain Location: R hip Pain Descriptors / Indicators: Sore Pain Intervention(s): Repositioned;Limited activity  within patient's tolerance;Monitored during session    Home Living                      Prior Function            PT Goals (current goals can now be found in the care plan section) Acute Rehab PT Goals PT Goal Formulation: With patient Time For Goal Achievement:  11/01/14 Potential to Achieve Goals: Good Progress towards PT goals: Progressing toward goals    Frequency  Min 3X/week    PT Plan Current plan remains appropriate    Co-evaluation             End of Session Equipment Utilized During Treatment: Gait belt Activity Tolerance: Patient tolerated treatment well Patient left: in chair;with call bell/phone within reach;with chair alarm set;Other (comment) (with CIR rep in room)     Time: 2010-0712 PT Time Calculation (min) (ACUTE ONLY): 16 min  Charges:  $Gait Training: 8-22 mins                    G Codes:      Guillermo Difrancesco LUBECK 10/19/2014, 11:22 AM

## 2014-10-19 NOTE — Progress Notes (Signed)
Patient Name: [name] Date of Encounter: 10/19/2014  Primary Cardiologist: Dr. Aundra Dubin   Principal Problem:   Syncope and collapse Active Problems:   CAD (coronary artery disease)   Essential thrombocythemia   Anemia due to chronic renal failure treated with erythropoietin   Ischemic cardiomyopathy   Essential hypertension   CKD (chronic kidney disease), stage IV   Arteriovenous malformation of small intestine   Acute blood loss anemia   Laceration of parietal lobe   Syncope   New right bundle branch block (RBBB)    SUBJECTIVE  Denies any discomfort. States he walked with a walker earlier.   CURRENT MEDS . sodium chloride   Intravenous Once  . sodium chloride   Intravenous Once  . anagrelide  1 mg Oral Daily  . cephALEXin  250 mg Oral 3 times per day  . cloNIDine  0.3 mg Oral TID  . docusate sodium  100 mg Oral BID  . hydrALAZINE  25 mg Oral TID  . metoprolol succinate  50 mg Oral Daily  . multivitamin with minerals   Oral Daily  . pantoprazole  40 mg Oral BID  . sodium chloride  3 mL Intravenous Q12H  . tamsulosin  0.4 mg Oral QPC supper    OBJECTIVE  Filed Vitals:   10/19/14 0130 10/19/14 0406 10/19/14 0647 10/19/14 1051  BP: 138/54 149/60  183/71  Pulse: 65 68  74  Temp: 97.1 F (36.2 C) 97.2 F (36.2 C)    TempSrc: Oral Oral    Resp: 18 18    Height:      Weight:   160 lb 8 oz (72.802 kg)   SpO2: 100% 100%      Intake/Output Summary (Last 24 hours) at 10/19/14 1150 Last data filed at 10/19/14 1054  Gross per 24 hour  Intake   2770 ml  Output   2320 ml  Net    450 ml   Filed Weights   10/17/14 0851 10/18/14 0658 10/19/14 0647  Weight: 162 lb (73.483 kg) 161 lb 9.5 oz (73.297 kg) 160 lb 8 oz (72.802 kg)    PHYSICAL EXAM  General: Pleasant, NAD. Neuro: Alert and oriented X 3. Moves all extremities spontaneously. Psych: Normal affect. HEENT:  Normal. R forehead laceration, no obvious bleeding Neck: Supple without bruits or JVD. Lungs:   Resp regular and unlabored, CTA. Heart: RRR no s3, s4, or murmurs. Abdomen: Soft, non-tender, non-distended, BS + x 4.  Extremities: No clubbing, cyanosis or edema. DP/PT/Radials 2+ and equal bilaterally.  Accessory Clinical Findings  CBC  Recent Labs  10/17/14 0850  10/18/14 2316 10/19/14 0710  WBC 9.0  < > 4.9 4.2  NEUTROABS 6.5  --   --   --   HGB 8.9*  < > 6.2* 6.6*  HCT 29.7*  < > 19.7* 20.3*  MCV 94.0  < > 92.9 92.3  PLT 504*  < > 256 225  < > = values in this interval not displayed. Basic Metabolic Panel  Recent Labs  10/17/14 0850 10/17/14 0914 10/19/14 0710  NA 137 137 139  K 5.1 5.1 4.4  CL 106 107 111  CO2 19*  --  18*  GLUCOSE 135* 133* 104*  BUN 98* 97* 82*  CREATININE 2.91* 2.80* 2.57*  CALCIUM 8.7*  --  8.0*   Liver Function Tests  Recent Labs  10/17/14 0850  AST 28  ALT 15*  ALKPHOS 68  BILITOT 0.6  PROT 5.8*  ALBUMIN 3.2*  Cardiac Enzymes  Recent Labs  10/17/14 1232 10/17/14 1820 10/18/14 0045  TROPONINI <0.03 <0.03 <0.03   Fasting Lipid Panel  Recent Labs  10/18/14 0045  CHOL 89  HDL 19*  LDLCALC 43  TRIG 137  CHOLHDL 4.7    TELE NSR with HR 50-60s, no significant ventricular ectopy    ECG  No new EKG  Echocardiogram  10/18/2014 Study Conclusions  - Left ventricle: The cavity size was at the upper limits of normal. Wall thickness was normal. Systolic function was normal. The estimated ejection fraction was in the range of 55% to 60%. - Ventricular septum: Septal motion showed paradox. - Left atrium: The atrium was severely dilated. - Right ventricle: The cavity size was mildly dilated. Wall thickness was normal. - Atrial septum: There was an atrial septal aneurysm.    Radiology/Studies  X-ray Chest Pa And Lateral  10/17/2014   CLINICAL DATA:  Syncope.  Fall.  Initial encounter.  EXAM: CHEST  2 VIEW  COMPARISON:  01/14/2013 and prior chest radiographs  FINDINGS: Cardiomegaly and CABG changes noted.   Mild pulmonary vascular congestion is identified.  Bilateral lower lobe opacities noted -question atelectasis versus airspace disease.  There is no evidence of pulmonary edema, suspicious pulmonary nodule/mass, pleural effusion, or pneumothorax. No acute bony abnormalities are identified.  IMPRESSION: Bilateral lower lobe opacities -question atelectasis versus airspace disease/pneumonia.  Cardiomegaly and mild pulmonary vascular congestion.       ASSESSMENT AND PLAN  81 year old gentleman with history of HTN, CAD s/p CABG 2006 and chronic kidney disease stage IV admitted after syncopal episode at home. He suffered a large laceration of the right temporal area.  1. Syncope with unclear etiology  - BP Lying 146/72 HR 72. Sitting 160/58 HR 68. Standing 172/84 HR 72  - no obvious orthostatic hypotension. Serial trop negative  - Echo shows 55-60% with mild to severe dilation as above, and atrial septal aneurysm. Reassuring  - Pending carotid U/S and MRI of brain. No further cardiac recommendation. Please call cardiology/card master before discharge to arrange for outpatient 30 day event monitor.   2. Acute on chronic anemia   - Hgb 10.2 on arrival --> 7.7 --> 6.2--> 6.6 Today  - Anemia workup for IM, likely result of R scalp laceration  - PRBC transfusion given significant drop  3. Intermittent RBBB  - no RBBB seen on telemetry, however EKG showed widened QRS  - Metoprolol XL from 100mg  BID to 50mg  daily  4. CAD s/p CABG 2006  5. ICM with baseline EF 45-50% on echo in 07/2011  - Echo 10/18/14 shows 55-60%  6. HTN  -Cont toprol-xl and hydralizine.  7. CKD stage IV  -Cr 2.91 --> 2.80 --> 2.57 Today   Signed, Almyra Deforest PA-C 10/19/2014 Pager: [phone]  The patient was seen, examined and discussed with Almyra Deforest, PA-C and I agree with the above.   81 year old male admitted with syncope, now with worsening anemia, on transfusion with PRBCs. No obvious reason for syncope found, telemetry  is unremarkable. We will arrange for an outpatient e-cardio monitor at the time of discharge. He appears euvolemic, we will monitor especially after receiving transfusions.   Dorothy Spark 10/19/2014

## 2014-10-19 NOTE — Progress Notes (Signed)
Inpatient Rehabilitation  I met with [name] at the bedside and was able to observe his PT session this am.  We discussed the possibility of IP Rehab only if he does not show adequate progress over the next day or two.  He will be receiving another transfusion later today.  Even with a hemoglobin of 6.6 this am following the first transfusion, he was able to tolerate a walk to the nurses station with what appeared as +1 min assist and +1 for equipment.  I anticipate he may not need IP Rehab and he believes he is progressing well enough that he will likely get to go home.  I will follow up tomorrow after further transfusions to determine if he needs IP Rehab. As always, admission to IP Rehab is dependent on need for CIR, bed availability and medical readiness.   I have discussed with Ellan Lambert, CM.  Please call if questions.  [facility] Admissions Coordinator Cell [phone] Office [phone]

## 2014-10-19 NOTE — Progress Notes (Signed)
Patient Demographics  [name], is a 81 y.o. male, DOB - January 04, 1932, SUP:[id number]  Admit date - 10/17/2014   Admitting Physician Orson Eva, MD  Outpatient Primary MD for the patient is [name], MD  LOS - 2   Chief Complaint  Patient presents with  . Fall  . Head Injury       Admission HPI/Brief narrative: 81 year old male with a history of coronary artery disease, left renal vein, thrombocytosis, CKD stage IV, hypertension presented after a syncopal episode on the morning of admission. The patient's daughter was at bedside supplementing history.The patient lost consciousness for a period of one to 2 minutes. The patient had profuse bleeding from a laceration on his scalp. His wife helped the patient to sit out on the porch. His mentation was back to baseline after he regained consciousness. In the emergency department, his scalp laceration was repaired. Workup revealed new right bundle branch block on EKG. CT of the brain and cervical spine were negative for any acute findings.   Subjective:   [name] today has, No headache, No chest pain, No abdominal pain - No Nausea, No new weakness tingling or numbness, No Cough - SOB.   Assessment & Plan    Principal Problem:   Syncope and collapse Active Problems:   CAD (coronary artery disease)   Essential thrombocythemia   Anemia due to chronic renal failure treated with erythropoietin   Ischemic cardiomyopathy   Essential hypertension   CKD (chronic kidney disease), stage IV   Arteriovenous malformation of small intestine   Acute blood loss anemia   Laceration of parietal lobe   Syncope   New right bundle branch block (RBBB)  Syncope - Unclear etiology, telemetry significant for frequent PVCs ,  patient is not orthostatic, CT head is negative, has new intermittent RBBB, possibly anemia contributing to it, unclear if bradycardia  contributing to it as he was on large dose beta blockers. - Serial troponins are negative -  carotid ultrasound pending - 2-D echo showing EF 55-60%, severely dilated left atrium with atrial septal aneurysm  - Telemetry significant for multiple PVCs, no malignant arrhythmia. - Decrease home dose metoprolol as per cardiology recommendation - May need outpatient event monitor if workup remains negative - cardiology following - will check MRI brain without contrast.  Anemia -Acute blood loss anemia from head laceration and bleed, well known history of chronic anemia, seen by hematology as an outpatient receiving Aranesp and IV iron transfusions. - Asian transfused one unit packed red blood cell 5/16, another unit 5/17 a.m., and despite that hemoglobin remains 6.6 ( went from 6.2-6.6 after 2 units), will transfuse another 2 units packed red blood cell on 5/17. - Discussed with his hematologist Dr. Burr Medico, pathology will see and evaluate the patient, they recommend DIC and hemolytic anemia workup. Patient declined bone marrow biopsy as an outpatient. - Hemoccult negative.  Lateral temporoparietal laceration - Sutures and routine wound care - Continue with Keflex for laceration   CAD/ New Right Bundle branch block  - Serial enzymes are negative - Cardiology consult appreciated - Denies any complaints of chest pain   Anemia due to chronic renal failure treated with erythropoietin -see acute blood loss anemia as above   Ischemic  cardiomyopathy -Last known EF 45% in 2013 - No evidence of volume overload    Essential thrombocythemia -Continue preadmission medications   Essential hypertension - Decrease home dose Toprol from 100 mg twice a day to 50 mg oral daily , as well decrease home clonidin  dose .   CKD (chronic kidney disease), stage IV - Continue to monitor, he is at baseline   Arteriovenous malformation of small intestine -Currently no signs of active GI bleeding,  Hemoccult negative   Code Status: Full  Family Communication: Spoke with wife over the phone  Disposition Plan: for CIR when medically stable, possibly in 1-2 days,   Procedures  1 unit packed red blood cell transfusion 10/18/14, 1 unit PRBC in 10/19/14, will transfuse another 2 units today. Suturing of laceration and head 10/17/14   Consults   Surgery Cardiology Hematology consult called  Medications  Scheduled Meds: . sodium chloride   Intravenous Once  . sodium chloride   Intravenous Once  . anagrelide  1 mg Oral Daily  . cephALEXin  250 mg Oral 3 times per day  . cloNIDine  0.3 mg Oral TID  . docusate sodium  100 mg Oral BID  . hydrALAZINE  25 mg Oral TID  . metoprolol succinate  50 mg Oral Daily  . multivitamin with minerals   Oral Daily  . pantoprazole  40 mg Oral BID  . sodium chloride  3 mL Intravenous Q12H  . tamsulosin  0.4 mg Oral QPC supper   Continuous Infusions: . sodium chloride 75 mL/hr at 10/18/14 2112   PRN Meds:.acetaminophen **OR** acetaminophen, acetaminophen-codeine, morphine injection, ondansetron **OR** ondansetron (ZOFRAN) IV, oxyCODONE  DVT Prophylaxis   SCDs and his anemia and significant bleed from laceration.  Lab Results  Component Value Date   PLT 225 10/19/2014    Antibiotics   Anti-infectives    Start     Dose/Rate Route Frequency Ordered Stop   10/18/14 1400  cephALEXin (KEFLEX) capsule 250 mg     250 mg Oral 3 times per day 10/18/14 1252     10/17/14 1341  acyclovir (ZOVIRAX) 200 MG capsule 200 mg  Status:  Discontinued     200 mg Oral As directed 10/17/14 1341 10/17/14 1423          Objective:   Filed Vitals:   10/19/14 0130 10/19/14 0406 10/19/14 0647 10/19/14 1051  BP: 138/54 149/60  183/71  Pulse: 65 68  74  Temp: 97.1 F (36.2 C) 97.2 F (36.2 C)    TempSrc: Oral Oral    Resp: 18 18    Height:      Weight:   72.802 kg (160 lb 8 oz)   SpO2: 100% 100%      Wt Readings from Last 3 Encounters:  10/19/14  72.802 kg (160 lb 8 oz)  09/22/14 73.483 kg (162 lb)  09/13/14 73.437 kg (161 lb 14.4 oz)     Intake/Output Summary (Last 24 hours) at 10/19/14 1140 Last data filed at 10/19/14 1054  Gross per 24 hour  Intake   2770 ml  Output   2320 ml  Net    450 ml     Physical Exam  Awake Alert, right head laceration, with dried blood, active oozing [place].AT,PERRAL Supple Neck,No JVD, No cervical lymphadenopathy appriciated.  Symmetrical Chest wall movement, Good air movement bilaterally,  RRR,No Gallops,Rubs, No Parasternal Heave +ve B.Sounds, Abd Soft, No tenderness, No organomegaly appriciated, No rebound - guarding or rigidity. No Cyanosis, no edema, pedal  pulses felt laterally.   Data Review   Micro Results Recent Results (from the past 240 hour(s))  Clostridium Difficile by PCR     Status: None   Collection Time: 10/17/14  5:37 PM  Result Value Ref Range Status   C difficile by pcr NEGATIVE NEGATIVE Final    Radiology Reports X-ray Chest Pa And Lateral  10/17/2014   CLINICAL DATA:  Syncope.  Fall.  Initial encounter.  EXAM: CHEST  2 VIEW  COMPARISON:  01/14/2013 and prior chest radiographs  FINDINGS: Cardiomegaly and CABG changes noted.  Mild pulmonary vascular congestion is identified.  Bilateral lower lobe opacities noted -question atelectasis versus airspace disease.  There is no evidence of pulmonary edema, suspicious pulmonary nodule/mass, pleural effusion, or pneumothorax. No acute bony abnormalities are identified.  IMPRESSION: Bilateral lower lobe opacities -question atelectasis versus airspace disease/pneumonia.  Cardiomegaly and mild pulmonary vascular congestion.   Electronically Signed   By: Margarette Canada M.D.   On: 10/17/2014 16:32   Ct Head Wo Contrast  10/17/2014   CLINICAL DATA:  Status post fall on the bathroom with laceration to the right parietal and occipital region. Patient has a history of prostate cancer.  EXAM: CT HEAD WITHOUT CONTRAST  CT CERVICAL SPINE WITHOUT  CONTRAST  TECHNIQUE: Multidetector CT imaging of the head and cervical spine was performed following the standard protocol without intravenous contrast. Multiplanar CT image reconstructions of the cervical spine were also generated.  COMPARISON:  MR brain August 26, 2013  FINDINGS: CT HEAD FINDINGS  There is chronic diffuse atrophy. There is no midline shift, hydrocephalus, or mass. No acute hemorrhage or acute transcortical infarct is identified. The bony calvarium is intact. There is right frontal parietal scalp swelling and hematoma. Complete opacity of bilateral maxillary sinuses are identified.  CT CERVICAL SPINE FINDINGS  There is no acute fracture or dislocation. Degenerative joint changes of the spine are identified. The prevertebral soft tissues are normal. There is a 1.4 x 0.7 cm lytic lesion in the anterior left C1.  IMPRESSION: Right frontal/ parietal scalp swelling and hematoma. No intracranial hemorrhage is identified.  No acute fracture or dislocation of cervical spine.   Electronically Signed   By: Abelardo Diesel M.D.   On: 10/17/2014 10:19   Ct Cervical Spine Wo Contrast  10/17/2014   CLINICAL DATA:  Status post fall on the bathroom with laceration to the right parietal and occipital region. Patient has a history of prostate cancer.  EXAM: CT HEAD WITHOUT CONTRAST  CT CERVICAL SPINE WITHOUT CONTRAST  TECHNIQUE: Multidetector CT imaging of the head and cervical spine was performed following the standard protocol without intravenous contrast. Multiplanar CT image reconstructions of the cervical spine were also generated.  COMPARISON:  MR brain August 26, 2013  FINDINGS: CT HEAD FINDINGS  There is chronic diffuse atrophy. There is no midline shift, hydrocephalus, or mass. No acute hemorrhage or acute transcortical infarct is identified. The bony calvarium is intact. There is right frontal parietal scalp swelling and hematoma. Complete opacity of bilateral maxillary sinuses are identified.  CT CERVICAL  SPINE FINDINGS  There is no acute fracture or dislocation. Degenerative joint changes of the spine are identified. The prevertebral soft tissues are normal. There is a 1.4 x 0.7 cm lytic lesion in the anterior left C1.  IMPRESSION: Right frontal/ parietal scalp swelling and hematoma. No intracranial hemorrhage is identified.  No acute fracture or dislocation of cervical spine.   Electronically Signed   By: Mallie Darting.D.  On: 10/17/2014 10:19   Dg Abd 2 Views  09/22/2014   CLINICAL DATA:  Assess for retained endoscopic camera capsule  EXAM: ABDOMEN - 2 VIEW  COMPARISON:  None.  FINDINGS: There is moderately increased stool burden throughout the colon. No radiopaque camera capsule is demonstrated. There is no small or large bowel obstructive pattern. There are degenerative changes of the lumbar spine with gentle dextrocurvature  IMPRESSION: No retained endoscopic capsule is demonstrated. Increase colonic stool burden may reflect clinical constipation.   Electronically Signed   By: David  Martinique M.D.   On: 09/22/2014 13:13   Dg Hip Port Unilat With Pelvis 1v Right  10/17/2014   CLINICAL DATA:  81 year old male with fall on right hip this morning with right hip pain. Initial encounter.  EXAM: RIGHT HIP (WITH PELVIS) 1 VIEW PORTABLE  COMPARISON:  None.  FINDINGS: There is no evidence of hip fracture or dislocation. There is no evidence of arthropathy or other focal bone abnormality.  IMPRESSION: Negative.   Electronically Signed   By: Margarette Canada M.D.   On: 10/17/2014 10:34     CBC  Recent Labs Lab 10/17/14 0850  10/17/14 2300 10/18/14 0735 10/18/14 1245 10/18/14 2316 10/19/14 0710  WBC 9.0  < > 5.9 5.1 7.7 4.9 4.2  HGB 8.9*  < > 6.2* 6.2* 7.5* 6.2* 6.6*  HCT 29.7*  < > 20.0* 20.5* 23.8* 19.7* 20.3*  PLT 504*  < > 320 260 364 256 225  MCV 94.0  < > 93.5 94.0 92.2 92.9 92.3  MCH 28.2  < > 29.0 28.4 29.1 29.2 30.0  MCHC 30.0  < > 31.0 30.2 31.5 31.5 32.5  RDW 19.4*  < > 19.2* 19.5* 18.7*  19.1* 18.1*  LYMPHSABS 1.3  --   --   --   --   --   --   MONOABS 0.9  --   --   --   --   --   --   EOSABS 0.3  --   --   --   --   --   --   BASOSABS 0.0  --   --   --   --   --   --   < > = values in this interval not displayed.  Chemistries   Recent Labs Lab 10/17/14 0850 10/17/14 0914 10/19/14 0710  NA 137 137 139  K 5.1 5.1 4.4  CL 106 107 111  CO2 19*  --  18*  GLUCOSE 135* 133* 104*  BUN 98* 97* 82*  CREATININE 2.91* 2.80* 2.57*  CALCIUM 8.7*  --  8.0*  AST 28  --   --   ALT 15*  --   --   ALKPHOS 68  --   --   BILITOT 0.6  --   --    ------------------------------------------------------------------------------------------------------------------ estimated creatinine clearance is 23.6 mL/min (by C-G formula based on Cr of 2.57). ------------------------------------------------------------------------------------------------------------------ No results for input(s): HGBA1C in the last 72 hours. ------------------------------------------------------------------------------------------------------------------  Recent Labs  10/18/14 0045  CHOL 89  HDL 19*  LDLCALC 43  TRIG 137  CHOLHDL 4.7   ------------------------------------------------------------------------------------------------------------------ No results for input(s): TSH, T4TOTAL, T3FREE, THYROIDAB in the last 72 hours.  Invalid input(s): FREET3 ------------------------------------------------------------------------------------------------------------------ No results for input(s): VITAMINB12, FOLATE, FERRITIN, TIBC, IRON, RETICCTPCT in the last 72 hours.  Coagulation profile  Recent Labs Lab 10/17/14 0850  INR 1.23    No results for input(s): DDIMER in the last 72 hours.  Cardiac Enzymes  Recent Labs Lab 10/17/14 1232 10/17/14 1820 10/18/14 0045  TROPONINI <0.03 <0.03 <0.03    ------------------------------------------------------------------------------------------------------------------ Invalid input(s): POCBNP     Time Spent in minutes   30 minutes    Camille Thau M.D on 10/19/2014 at 11:40 AM  Between 7am to 7pm - Pager - [phone]  After 7pm go to [email or web address] - password [email or web address]  [facility]   Office  [phone]

## 2014-10-19 NOTE — Consult Note (Signed)
Physical Medicine and Rehabilitation Consult Reason for Consult: Syncope Referring Physician: [facility]   HPI: [name] is a 81 y.o. right handed male with history of chronic kidney disease stage IV with baseline creatinine 2.8, thrombocythemia maintained on agrylin, coronary artery disease with associated ischemic cardiomyopathy and erosive esophagitis with stricture. Patient lives with his wife used occasional cane prior to admission and still driving. Presented 10/17/2014 with  syncopal episode while at home witnessed by his wife. During the fall patient struck his head sustaining a significant deep laceration with significant bleeding. Cranial CT scan showed no intracranial acute process. Right frontal parietal scalp swelling and hematoma noted. CT cervical spine negative. Complaints of right hip pain with x-rays negative for fracture. EKG revealed new right bundle branch block. Troponin negative. Workup with syncope ongoing with carotid ultrasound pending. Findings of hemoglobin 6.2 from baseline of 10 he was transfused 1 unit of packed red blood cells. Patient received sutures to scalp laceration. Physical therapy evaluation completed 10/18/2014 with recommendations of physical medicine rehabilitation consult.   Review of Systems  HENT: Positive for hearing loss.   Gastrointestinal: Positive for constipation.       GERD  Genitourinary: Positive for urgency.  Musculoskeletal: Positive for myalgias and joint pain.  All other systems reviewed and are negative.  Past Medical History  Diagnosis Date  . Chronic kidney disease   . Thrombocythemia   . Esophageal stricture   . Anemia   . Anemia due to chronic renal failure treated with erythropoietin 03/24/2011  . Retinal vein thrombosis, left   . Coronary artery disease   . Hypertension   . GERD (gastroesophageal reflux disease)   . Arthritis   . Anginal pain   . Erosive esophagitis     grade 4  . Diverticulosis   . Internal  hemorrhoids   . HOH (hard of hearing)    Past Surgical History  Procedure Laterality Date  . Replacement total knee Right 04/19/08  . Av fistula placement Left 12/01/2013    Procedure: RADIOCEPHALIC ARTERIOVENOUS (AV) FISTULA CREATION;  Surgeon: Elam Dutch, MD;  Location: [facility];  Service: Vascular;  Laterality: Left;  . Coronary artery bypass graft  2008    x 5  . Eye surgery Left yrs ago    vitrectomy  . Eye brow lift Right     01-11-2014  . Inguinal hernia repair Left 05/08/01  . Colonoscopy N/A 02/02/2014    Procedure: COLONOSCOPY;  Surgeon: Inda Castle, MD;  Location: [facility];  Service: Endoscopy;  Laterality: N/A;  . Esophagogastroduodenoscopy N/A 02/02/2014    Procedure: ESOPHAGOGASTRODUODENOSCOPY (EGD);  Surgeon: Inda Castle, MD;  Location: [facility];  Service: Endoscopy;  Laterality: N/A;  . Ligation of arteriovenous  fistula Left 07/06/2014    Procedure: LIGATION OF ARTERIOVENOUS  FISTULA;  Surgeon: Elam Dutch, MD;  Location: [facility];  Service: Vascular;  Laterality: Left;   Family History  Problem Relation Age of Onset  . Dementia Mother   . Angina Father   . Heart attack Neg Hx    Social History:  reports that he quit smoking about 35 years ago. His smoking use included Cigarettes. He quit smokeless tobacco use about 35 years ago. His smokeless tobacco use included Chew. He reports that he does not drink alcohol or use illicit drugs. Allergies:  Allergies  Allergen Reactions  . Isosorbide Other (See Comments)    Caused a blood clot  . Norvasc [Amlodipine Besylate] Other (  See Comments)    TIA's   Medications Prior to Admission  Medication Sig Dispense Refill  . acetaminophen-codeine (TYLENOL #2) 300-15 MG per tablet Take 1 tablet by mouth every 4 (four) hours as needed for moderate pain. 10 tablet 0  . acyclovir (ZOVIRAX) 200 MG capsule Take 200 mg by mouth as directed.  4  . anagrelide (AGRYLIN) 1 MG capsule TAKE ONE CAPSULE BY MOUTH ONCE DAILY OR  AS DIRECTED BY PHYSICIAN. 30 capsule 0  . aspirin EC 81 MG tablet Take 81 mg by mouth daily.    . cloNIDine (CATAPRES) 0.3 MG tablet Take 0.6 mg by mouth 3 (three) times daily.  0  . furosemide (LASIX) 40 MG tablet Take 40 mg by mouth 2 (two) times daily.     . hydrALAZINE (APRESOLINE) 25 MG tablet take 1 tablet by mouth three times a day 90 tablet 10  . metoprolol succinate (TOPROL-XL) 100 MG 24 hr tablet take 1 tablet by mouth twice a day with OR IMMEDIATELY AFTER A MEAL (Patient taking differently: Take 100mg  by mouth twice daily.) 60 tablet 10  . Multiple Vitamins-Minerals (MULTIVITAMIN ADULT) TABS Take 1 tablet by mouth daily.    . pantoprazole (PROTONIX) 40 MG tablet Take 1 tablet (40 mg total) by mouth 2 (two) times daily. 60 tablet 3  . Tamsulosin HCl (FLOMAX) 0.4 MG CAPS Take 0.4 mg by mouth daily after supper.     . ferrous sulfate 325 (65 FE) MG EC tablet Take 1 tablet (325 mg total) by mouth 3 (three) times daily with meals. (Patient not taking: Reported on 10/17/2014) 60 tablet 12    Home: [place] expects to be discharged to:: Private residence Living Arrangements: Spouse/significant other Available Help at Discharge: Family, Available 24 hours/day (supervision only, wife and handicapped son) Type of Home: House Home Access: Stairs to enter Technical brewer of Steps: 2 Entrance Stairs-Rails: Left Home Layout: One level Home Equipment: Environmental consultant - standard, Cane - single point, Bedside commode, Shower seat, Other (comment) (stationary bike, tub/shower) Additional Comments: Pt states son cannot help him but he doesn't have to help the son other than they supervise and remind him of things.  His wife can provide supervision only.    Functional History: Prior Function Level of Independence: Independent with assistive device(s), Needs assistance Gait / Transfers Assistance Needed: used cane at home when going long distances per pt.  asked if he had balance  problems before and pt states no. ADL's / Homemaking Assistance Needed: independent Functional Status:  Mobility: Bed Mobility Overal bed mobility: Needs Assistance Bed Mobility: Supine to Sit Supine to sit: Min assist General bed mobility comments: Needed assist for elevation of trunk.  Pt with ataxic trunk and extremities making movement slightly more difficult as pt with poor coordination of movement.   Transfers Overall transfer level: Needs assistance Equipment used: Rolling walker (2 wheeled) Transfers: Sit to/from Stand Sit to Stand: Mod assist, +2 safety/equipment General transfer comment: Pt needed asssit to power up with ataxic movement of LEs and trunk.  Pt unsteady needing steadying assist.  Took incr time for pt to achieve balance.   Ambulation/Gait Ambulation/Gait assistance: Mod assist, +2 physical assistance Ambulation Distance (Feet): 24 Feet (12 feet x 2) Assistive device: Rolling walker (2 wheeled) General Gait Details: Pt ambulated with RW with unsteady gait due to LE and truncal ataxia.  Pt able to ambulate with RW with min assist overall of 2 people but once RW removed, pt with significant sway  with pt overcorrecting with extensionwith poor postural stability overall.   Gait Pattern/deviations: Step-through pattern, Decreased stride length, Decreased step length - right, Decreased step length - left, Decreased dorsiflexion - right, Decreased dorsiflexion - left, Ataxic, Trunk flexed, Wide base of support, Leaning posteriorly Gait velocity interpretation: Below normal speed for age/gender    ADL: ADL Overall ADL's : Needs assistance/impaired Eating/Feeding: Modified independent, Sitting Grooming: Wash/dry hands, Wash/dry face, Brushing hair, Minimal assistance, Standing Upper Body Bathing: Minimal assitance, Sitting Lower Body Bathing: Sit to/from stand, Moderate assistance Upper Body Dressing : Minimal assistance, Sitting Lower Body Dressing: Moderate assistance,  Sit to/from stand Toilet Transfer: Minimal assistance, +2 for physical assistance, RW, Ambulation, Comfort height toilet, Grab bars Toileting- Clothing Manipulation and Hygiene: Moderate assistance, Sit to/from stand Functional mobility during ADLs: Minimal assistance, +2 for physical assistance, Rolling walker General ADL Comments: Pt with ataxia  Cognition: Cognition Overall Cognitive Status: Impaired/Different from baseline Orientation Level: Oriented X4 Cognition Arousal/Alertness: Awake/alert Behavior During Therapy: WFL for tasks assessed/performed Overall Cognitive Status: Impaired/Different from baseline Area of Impairment: Safety/judgement, Awareness, Problem solving Safety/Judgement: Decreased awareness of safety, Decreased awareness of deficits Awareness: Intellectual Problem Solving: Difficulty sequencing, Requires verbal cues, Requires tactile cues General Comments: Pt unaware of his balance issues.  Needed cuing for safety with balance.    Blood pressure 149/60, pulse 68, temperature 97.2 F (36.2 C), temperature source Oral, resp. rate 18, height 5\' 11"  (1.803 m), weight 73.297 kg (161 lb 9.5 oz), SpO2 100 %. Physical Exam  Vitals reviewed. Constitutional: He is oriented to person, place, and time.  HENT:  Healing scalp laceration with significant bruising  Eyes: EOM are normal.  Neck: Normal range of motion. Neck supple. No thyromegaly present.  Cardiovascular: Normal rate and regular rhythm.   Respiratory: Effort normal and breath sounds normal. No respiratory distress.  GI: Soft. Bowel sounds are normal. He exhibits no distension.  Neurological: He is alert and oriented to person, place, and time.  Follows commands. He could not recall the events of his fall. No nystagmus. Cognitively intact. No tremor. Strength 4/5 bilateral UE's. 4-HF and 4/5 distally in LE's  Skin:  Large scalp wound with sutures right temporal area.     Results for orders placed or  performed during the hospital encounter of 10/17/14 (from the past 24 hour(s))  Prepare RBC     Status: None   Collection Time: 10/18/14  7:14 AM  Result Value Ref Range   Order Confirmation ORDER PROCESSED BY BLOOD BANK   CBC     Status: Abnormal   Collection Time: 10/18/14  7:35 AM  Result Value Ref Range   WBC 5.1 4.0 - 10.5 K/uL   RBC 2.18 (L) 4.22 - 5.81 MIL/uL   Hemoglobin 6.2 (LL) 13.0 - 17.0 g/dL   HCT 20.5 (L) 39.0 - 52.0 %   MCV 94.0 78.0 - 100.0 fL   MCH 28.4 26.0 - 34.0 pg   MCHC 30.2 30.0 - 36.0 g/dL   RDW 19.5 (H) 11.5 - 15.5 %   Platelets 260 150 - 400 K/uL  Glucose, capillary     Status: Abnormal   Collection Time: 10/18/14 11:48 AM  Result Value Ref Range   Glucose-Capillary 120 (H) 65 - 99 mg/dL  CBC     Status: Abnormal   Collection Time: 10/18/14 12:45 PM  Result Value Ref Range   WBC 7.7 4.0 - 10.5 K/uL   RBC 2.58 (L) 4.22 - 5.81 MIL/uL   Hemoglobin 7.5 (L) 13.0 -  17.0 g/dL   HCT 23.8 (L) 39.0 - 52.0 %   MCV 92.2 78.0 - 100.0 fL   MCH 29.1 26.0 - 34.0 pg   MCHC 31.5 30.0 - 36.0 g/dL   RDW 18.7 (H) 11.5 - 15.5 %   Platelets 364 150 - 400 K/uL  CBC     Status: Abnormal   Collection Time: 10/18/14 11:16 PM  Result Value Ref Range   WBC 4.9 4.0 - 10.5 K/uL   RBC 2.12 (L) 4.22 - 5.81 MIL/uL   Hemoglobin 6.2 (LL) 13.0 - 17.0 g/dL   HCT 19.7 (L) 39.0 - 52.0 %   MCV 92.9 78.0 - 100.0 fL   MCH 29.2 26.0 - 34.0 pg   MCHC 31.5 30.0 - 36.0 g/dL   RDW 19.1 (H) 11.5 - 15.5 %   Platelets 256 150 - 400 K/uL  Prepare RBC     Status: None   Collection Time: 10/19/14  1:00 AM  Result Value Ref Range   Order Confirmation ORDER PROCESSED BY BLOOD BANK    X-ray Chest Pa And Lateral  10/17/2014   CLINICAL DATA:  Syncope.  Fall.  Initial encounter.  EXAM: CHEST  2 VIEW  COMPARISON:  01/14/2013 and prior chest radiographs  FINDINGS: Cardiomegaly and CABG changes noted.  Mild pulmonary vascular congestion is identified.  Bilateral lower lobe opacities noted -question  atelectasis versus airspace disease.  There is no evidence of pulmonary edema, suspicious pulmonary nodule/mass, pleural effusion, or pneumothorax. No acute bony abnormalities are identified.  IMPRESSION: Bilateral lower lobe opacities -question atelectasis versus airspace disease/pneumonia.  Cardiomegaly and mild pulmonary vascular congestion.   Electronically Signed   By: Margarette Canada M.D.   On: 10/17/2014 16:32   Ct Head Wo Contrast  10/17/2014   CLINICAL DATA:  Status post fall on the bathroom with laceration to the right parietal and occipital region. Patient has a history of prostate cancer.  EXAM: CT HEAD WITHOUT CONTRAST  CT CERVICAL SPINE WITHOUT CONTRAST  TECHNIQUE: Multidetector CT imaging of the head and cervical spine was performed following the standard protocol without intravenous contrast. Multiplanar CT image reconstructions of the cervical spine were also generated.  COMPARISON:  MR brain August 26, 2013  FINDINGS: CT HEAD FINDINGS  There is chronic diffuse atrophy. There is no midline shift, hydrocephalus, or mass. No acute hemorrhage or acute transcortical infarct is identified. The bony calvarium is intact. There is right frontal parietal scalp swelling and hematoma. Complete opacity of bilateral maxillary sinuses are identified.  CT CERVICAL SPINE FINDINGS  There is no acute fracture or dislocation. Degenerative joint changes of the spine are identified. The prevertebral soft tissues are normal. There is a 1.4 x 0.7 cm lytic lesion in the anterior left C1.  IMPRESSION: Right frontal/ parietal scalp swelling and hematoma. No intracranial hemorrhage is identified.  No acute fracture or dislocation of cervical spine.   Electronically Signed   By: Abelardo Diesel M.D.   On: 10/17/2014 10:19   Ct Cervical Spine Wo Contrast  10/17/2014   CLINICAL DATA:  Status post fall on the bathroom with laceration to the right parietal and occipital region. Patient has a history of prostate cancer.  EXAM: CT  HEAD WITHOUT CONTRAST  CT CERVICAL SPINE WITHOUT CONTRAST  TECHNIQUE: Multidetector CT imaging of the head and cervical spine was performed following the standard protocol without intravenous contrast. Multiplanar CT image reconstructions of the cervical spine were also generated.  COMPARISON:  MR brain August 26, 2013  FINDINGS: CT HEAD FINDINGS  There is chronic diffuse atrophy. There is no midline shift, hydrocephalus, or mass. No acute hemorrhage or acute transcortical infarct is identified. The bony calvarium is intact. There is right frontal parietal scalp swelling and hematoma. Complete opacity of bilateral maxillary sinuses are identified.  CT CERVICAL SPINE FINDINGS  There is no acute fracture or dislocation. Degenerative joint changes of the spine are identified. The prevertebral soft tissues are normal. There is a 1.4 x 0.7 cm lytic lesion in the anterior left C1.  IMPRESSION: Right frontal/ parietal scalp swelling and hematoma. No intracranial hemorrhage is identified.  No acute fracture or dislocation of cervical spine.   Electronically Signed   By: Abelardo Diesel M.D.   On: 10/17/2014 10:19   Dg Hip Port Unilat With Pelvis 1v Right  10/17/2014   CLINICAL DATA:  81 year old male with fall on right hip this morning with right hip pain. Initial encounter.  EXAM: RIGHT HIP (WITH PELVIS) 1 VIEW PORTABLE  COMPARISON:  None.  FINDINGS: There is no evidence of hip fracture or dislocation. There is no evidence of arthropathy or other focal bone abnormality.  IMPRESSION: Negative.   Electronically Signed   By: Margarette Canada M.D.   On: 10/17/2014 10:34    Assessment/Plan: Diagnosis: recent fall, syncopal episode with head laceration--likely multifactorial 1. Does the need for close, 24 hr/day medical supervision in concert with the patient's rehab needs make it unreasonable for this patient to be served in a less intensive setting? Potentially 2. Co-Morbidities requiring supervision/potential complications:  anemia, cad, ckd 3. Due to bladder management, bowel management, safety, skin/wound care, disease management, medication administration, pain management and patient education, does the patient require 24 hr/day rehab nursing? Potentially 4. Does the patient require coordinated care of a physician, rehab nurse, PT (1-2 hrs/day, 5 days/week) and OT (1-2 hrs/day, 5 days/week) to address physical and functional deficits in the context of the above medical diagnosis(es)? Potentially Addressing deficits in the following areas: balance, endurance, locomotion, strength, transferring, bowel/bladder control, bathing, dressing, feeding, grooming, toileting, cognition and psychosocial support 5. Can the patient actively participate in an intensive therapy program of at least 3 hrs of therapy per day at least 5 days per week? Potentially 6. The potential for patient to make measurable gains while on inpatient rehab is fair 7. Anticipated functional outcomes upon discharge from inpatient rehab are modified independent and supervision  with PT, modified independent and supervision with OT, n/a with SLP. 8. Estimated rehab length of stay to reach the above functional goals is: TBD 9. Does the patient have adequate social supports and living environment to accommodate these discharge functional goals? Yes 10. Anticipated D/C setting: Home 11. Anticipated post D/C treatments: HH therapy and Outpatient therapy 12. Overall Rehab/Functional Prognosis: good  RECOMMENDATIONS: This patient's condition is appropriate for continued rehabilitative care in the following setting: likely home health (see below)  Patient has agreed to participate in recommended program. Potentially Note that insurance prior authorization may be required for reimbursement for recommended care.  Comment: Pt admitted after syncope/fall. Received blood transfusion for anemia. He feels that he's getting close to baseline based on OOB/movement today  with nurse tech. Would like to see further therapy assessment to determine if we can justify an inpatient rehab stay base on functional needs. If he needs just a day or two more to improve balance and stamina, that could be accomplished here on acute.   Meredith Staggers, MD, [facility] 10/19/2014      10/19/2014

## 2014-10-20 ENCOUNTER — Inpatient Hospital Stay (HOSPITAL_COMMUNITY): Payer: Medicare Other

## 2014-10-20 DIAGNOSIS — I4510 Unspecified right bundle-branch block: Secondary | ICD-10-CM

## 2014-10-20 DIAGNOSIS — S0191XA Laceration without foreign body of unspecified part of head, initial encounter: Secondary | ICD-10-CM | POA: Insufficient documentation

## 2014-10-20 DIAGNOSIS — N184 Chronic kidney disease, stage 4 (severe): Secondary | ICD-10-CM

## 2014-10-20 LAB — BASIC METABOLIC PANEL
Anion gap: 8 (ref 5–15)
BUN: 68 mg/dL — AB (ref 6–20)
CHLORIDE: 113 mmol/L — AB (ref 101–111)
CO2: 19 mmol/L — ABNORMAL LOW (ref 22–32)
Calcium: 8.3 mg/dL — ABNORMAL LOW (ref 8.9–10.3)
Creatinine, Ser: 2.26 mg/dL — ABNORMAL HIGH (ref 0.61–1.24)
GFR, EST AFRICAN AMERICAN: 30 mL/min — AB (ref >60)
GFR, EST NON AFRICAN AMERICAN: 26 mL/min — AB (ref >60)
GLUCOSE: 106 mg/dL — AB (ref 65–99)
POTASSIUM: 4.5 mmol/L (ref 3.5–5.1)
SODIUM: 140 mmol/L (ref 135–145)

## 2014-10-20 LAB — TYPE AND SCREEN
ABO/RH(D): A POS
Antibody Screen: NEGATIVE
UNIT DIVISION: 0
Unit division: 0
Unit division: 0
Unit division: 0

## 2014-10-20 LAB — CBC
HCT: 26.8 % — ABNORMAL LOW (ref 39.0–52.0)
HEMOGLOBIN: 8.7 g/dL — AB (ref 13.0–17.0)
MCH: 29.7 pg (ref 26.0–34.0)
MCHC: 32.5 g/dL (ref 30.0–36.0)
MCV: 91.5 fL (ref 78.0–100.0)
Platelets: 227 10*3/uL (ref 150–400)
RBC: 2.93 MIL/uL — AB (ref 4.22–5.81)
RDW: 18.1 % — ABNORMAL HIGH (ref 11.5–15.5)
WBC: 4.6 10*3/uL (ref 4.0–10.5)

## 2014-10-20 LAB — GLUCOSE, CAPILLARY: Glucose-Capillary: 114 mg/dL — ABNORMAL HIGH (ref 65–99)

## 2014-10-20 LAB — HAPTOGLOBIN: Haptoglobin: 137 mg/dL (ref 34–200)

## 2014-10-20 MED ORDER — SODIUM CHLORIDE 0.9 % IV SOLN
510.0000 mg | Freq: Once | INTRAVENOUS | Status: AC
  Administered 2014-10-20: 510 mg via INTRAVENOUS
  Filled 2014-10-20: qty 17

## 2014-10-20 MED ORDER — HYDRALAZINE HCL 50 MG PO TABS
50.0000 mg | ORAL_TABLET | Freq: Three times a day (TID) | ORAL | Status: DC
  Administered 2014-10-20 – 2014-10-21 (×5): 50 mg via ORAL
  Filled 2014-10-20 (×6): qty 1

## 2014-10-20 NOTE — Progress Notes (Signed)
Physical Therapy Treatment Patient Details Name: [name] MRN: [id number] DOB: 06-06-1931 Today's Date: 10/20/2014    History of Present Illness 81 year old male with a history of coronary artery disease, left renal vein, thrombocytosis, CKD stage IV, hypertension presented after a syncopal episode on the morning of admission. The patient's daughter was at bedside supplementing history.The patient lost consciousness for a period of one to 2 minutes. The patient had profuse bleeding from a laceration on his scalp. His wife helped the patient to sit out on the porch. His mentation was back to baseline after he regained consciousness. In the emergency department, his scalp laceration was repaired. Workup revealed new right bundle branch block on EKG. CT of the brain and cervical spine were negative for any acute findings.     PT Comments    Pt has progressed well and is ambulating with increased stability and safety as hgb has increased. Currently at min/ min-guard A level. Recommend RW for d/c as well as HHPT. Pt continues to be high fall risk and will need supervision at home. PT will continue to follow.   Follow Up Recommendations  Home health PT;Supervision/Assistance - 24 hour     Equipment Recommendations  Rolling walker with 5" wheels    Recommendations for Other Services       Precautions / Restrictions Precautions Precautions: Fall Restrictions Weight Bearing Restrictions: No    Mobility  Bed Mobility               General bed mobility comments: pt received in chair  Transfers Overall transfer level: Needs assistance Equipment used: Rolling walker (2 wheeled) Transfers: Sit to/from Stand Sit to Stand: Supervision         General transfer comment: pt needed no physical assist to stand, initial posterior lean with standing but pt corrected with vc's and was able to keep wt fwd rest of time  Ambulation/Gait Ambulation/Gait assistance: Min guard;Min  assist Ambulation Distance (Feet): 225 Feet Assistive device: Rolling walker (2 wheeled) Gait Pattern/deviations: Step-through pattern;Festinating;Antalgic Gait velocity: decreased though pt able to increase pace with vc's and no LOB   General Gait Details: pt with antalgic gait at first that improved with distance and pt stated that right hip felt better as he went. Began with min A, progressed to min-guard. Mild ataxia persists. No LOB with ambulation but urged pt to consistently use RW for all ambulation   Stairs Stairs: Yes Stairs assistance: Min guard Stair Management: Two rails;Alternating pattern;Forwards Number of Stairs: 2 General stair comments: right hip pain with lead up right, education given on leading left as needed. Safe with use of rails, which he has at home  Wheelchair Mobility    Modified Rankin (Stroke Patients Only)       Balance Overall balance assessment: Needs assistance Sitting-balance support: No upper extremity supported Sitting balance-Leahy Scale: Good     Standing balance support: No upper extremity supported;During functional activity Standing balance-Leahy Scale: Fair Standing balance comment: able to maintain static stance without UE support but unsafe with dynamic activity without UE support                    Cognition Arousal/Alertness: Awake/alert Behavior During Therapy: WFL for tasks assessed/performed Overall Cognitive Status: Within Functional Limits for tasks assessed                 General Comments: pt showing better awareness of deficits and can verbalize safety concerns as well as need for RW at  all times    Exercises General Exercises - Lower Extremity Mini-Sqauts: 10 reps;Strengthening;Standing    General Comments General comments (skin integrity, edema, etc.): pt feeling better after 4 units PRBC, concerned that weakness could return quickly over next few days and discussed letting physican know if this  happens before he falls/ can't get up, etc      Pertinent Vitals/Pain Pain Assessment: Faces Faces Pain Scale: Hurts little more Pain Location: right hip Pain Intervention(s): Monitored during session    Home Living                      Prior Function            PT Goals (current goals can now be found in the care plan section) Acute Rehab PT Goals Patient Stated Goal: to go home PT Goal Formulation: With patient Time For Goal Achievement: 11/01/14 Potential to Achieve Goals: Good Progress towards PT goals: Progressing toward goals    Frequency  Min 3X/week    PT Plan Discharge plan needs to be updated    Co-evaluation             End of Session Equipment Utilized During Treatment: Gait belt Activity Tolerance: Patient tolerated treatment well Patient left: in chair;with call bell/phone within reach;with chair alarm set     Time: 1017-5102 PT Time Calculation (min) (ACUTE ONLY): 24 min  Charges:  $Gait Training: 23-37 mins                    G Codes:     Leighton Roach, PT  Acute Rehab Services  [phone]  Leighton Roach 10/20/2014, 9:34 AM

## 2014-10-20 NOTE — Progress Notes (Signed)
Inpatient Rehabilitation  I spoke with [facility], PT and have reviewed PT and OT notes from this am.  Pt. has made progress with therapies and their recommendation is now [facility].  I have discussed with the patient and he is in agreement with this plan.  He says his son who has a disability is able to assist him physically as needed.  Also wife is present.  At this time, I will sign off. I have notified Ellan Lambert, CM.   Please call if questions.  [place] Admissions Coordinator Cell [phone] Office [phone]

## 2014-10-20 NOTE — Progress Notes (Signed)
Patient and family aware of High Fall Risk status which includes 24 hr a day observation via camera ,yellow arm band , yellow socks, chair alarm and bed alarm .

## 2014-10-20 NOTE — Progress Notes (Signed)
Occupational Therapy Treatment Patient Details Name: [name] MRN: [id number] DOB: [birth year] Today's Date: 10/20/2014    History of present illness 81 year old male with a history of coronary artery disease, left renal vein, thrombocytosis, CKD stage IV, hypertension presented after a syncopal episode on the morning of admission. The patient's daughter was at bedside supplementing history.The patient lost consciousness for a period of one to 2 minutes. The patient had profuse bleeding from a laceration on his scalp. His wife helped the patient to sit out on the porch. His mentation was back to baseline after he regained consciousness. In the emergency department, his scalp laceration was repaired. Workup revealed new right bundle branch block on EKG. CT of the brain and cervical spine were negative for any acute findings.    OT comments  Pt able to stand x 10 minutes at sink with supervision and perform LB bathing and dressing with supervision.  Pt is agreeable to supervision of his family for tub transfer at home and to use walker. Instructed pt not to drive until he returns to MD and given clearance. Pt with some impulsivity and balance issues continue to make him a fall risk.  Will follow.  Follow Up Recommendations  Home health OT    Equipment Recommendations  None recommended by OT    Recommendations for Other Services      Precautions / Restrictions Precautions Precautions: Fall Restrictions Weight Bearing Restrictions: No       Mobility Bed Mobility               General bed mobility comments: pt received in chair  Transfers Overall transfer level: Needs assistance Equipment used: Rolling walker (2 wheeled) Transfers: Sit to/from Stand Sit to Stand: Supervision         General transfer comment: pt needed no physical assist to stand, initial posterior lean with standing but pt corrected with vc's and was able to keep wt fwd rest of time    Balance Overall  balance assessment: Needs assistance Sitting-balance support: No upper extremity supported Sitting balance-Leahy Scale: Good Sitting balance - Comments: no LOB with LB dressing   Standing balance support: No upper extremity supported;During functional activity Standing balance-Leahy Scale: Fair Standing balance comment: can stand at sink and use B UEs simultaneously                   ADL Overall ADL's : Needs assistance/impaired     Grooming: Oral care;Wash/dry hands;Wash/dry face;Standing;Supervision/safety Grooming Details (indicate cue type and reason): stood x 10 minutes at sink     Lower Body Bathing: Supervison/ safety;Sit to/from stand Lower Body Bathing Details (indicate cue type and reason): stood and washed periarea, smelled of urine     Lower Body Dressing: Supervision/safety;Sit to/from stand Lower Body Dressing Details (indicate cue type and reason): able to donn and doff socks and simulated pants over feet and pulling up pants over hips Toilet Transfer: Min guard;Ambulation;Comfort height toilet;RW;Grab bars   Toileting- Clothing Manipulation and Hygiene: Supervision/safety;Sit to/from stand       Functional mobility during ADLs: Min guard;Rolling walker General ADL Comments: Pt agreeable to son assisting him with tub transfer and to avoid showering if pt is feeling weak.  Pt states he has many grab bars around toilet and tub. Instructed pt not to drive to [facility] oncology appointment.      Vision                     Perception  Praxis      Cognition   Behavior During Therapy: WFL for tasks assessed/performed Overall Cognitive Status: Within Functional Limits for tasks assessed (slightly impulsive)                  General Comments: pt able to verbalize need for assistance with tub transfers, importance of use of RW and to self monitor for signs of decreasing hgb    Extremity/Trunk Assessment               Exercises  General Exercises - Lower Extremity Mini-Sqauts: 10 reps;Strengthening;Standing   Shoulder Instructions       General Comments      Pertinent Vitals/ Pain       Pain Assessment: Faces Faces Pain Scale: Hurts little more Pain Location: R hip with standing, walking Pain Descriptors / Indicators: Sore Pain Intervention(s): Limited activity within patient's tolerance;Monitored during session;Repositioned  Home Living                                          Prior Functioning/Environment              Frequency Min 2X/week     Progress Toward Goals  OT Goals(current goals can now be found in the care plan section)  Progress towards OT goals: Progressing toward goals  Acute Rehab OT Goals Patient Stated Goal: to go home  Plan Discharge plan needs to be updated    Co-evaluation                 End of Session     Activity Tolerance Patient tolerated treatment well   Patient Left in chair;with call bell/phone within reach;with chair alarm set   Nurse Communication  (ok to give pt coffee)        Time: 6256-3893 OT Time Calculation (min): 34 min  Charges: OT General Charges $OT Visit: 1 Procedure OT Treatments $Self Care/Home Management : 23-37 mins  Malka So 10/20/2014, 9:58 AM  [phone]

## 2014-10-20 NOTE — Progress Notes (Signed)
[facility] PROGRESS NOTE  ELDRIGE PITKIN WER:[id number] DOB: 1931/08/31 DOA: 10/17/2014 PCP: [name], MD  Assessment/Plan: #1 syncopal episode Unknown etiology. Telemetry was significant frequent PVCs. Patient is not orthostatic. CT head negative. MRI head negative. Carotid ultrasound pending. 2-D echo with EF of 55-60% with severely dilated left atrium and it shows septal aneurysm. Home dose metoprolol has been decreased. Patient will likely need an outpatient event monitor as workup has been negative today. Cardiology following. Appreciate input and recommendations.  #2 acute blood loss anemia from head laceration and bleed/chronic anemia being followed by hematology receiving outpatient and aranesp and iron transfusions. Patient has been transfused total of 2 units of packed red blood cells on 5/16 and 5/17. Patient was transfused another 2 units on 5/17. Patient has been seen by hematology who recommended IV iron. DIC N hemolytic anemia workup is negative. Hemoglobin currently at 8.7. Follow H&H. We'll need to follow-up with oncology as outpatient.  #3 lateral temporoparietal laceration Status post suturing. Continue Keflex for laceration. Follow.  #4 coronary artery disease/new right bundle branch block Cardiac enzymes negative today. Patient denies any current chest pain. 2-D echo with EF of 55-60%. Cardiology following.  #5 ischemic cardiomyopathy Patient is currently stable. No signs of volume overload. Follow.  #6 essential thrombocytopenia Hematology following.  #7 hypertension Continue current regimen.  #8 chronic kidney disease stage IV Stable.  #9 AVM of small intestine Currently no signs of active bleeding. Hemoccult negative. Follow.  #10 prophylaxis  SCDs for DVT prophylaxis.  Code Status: Full Family Communication: Updated patient. No family present. Disposition Plan: Inpatient rehabilitation versus home health when medically stable and syncopal  workup is done.   Consultants:  Dental surgery: Dr. Sabra Heck 10/17/2014  Cardiology: Dr. Mare Ferrari 10/17/2014  Inpatient rehabilitation 10/19/2014  Oncology: Dr. Burr Medico 10/19/2014  Procedures:  CT at 10/17/2014  CT C-spine 10/17/2014  Chest x-ray 10/17/2014  MRI head 10/20/2014  2-D echo 10/18/2014  X-ray of the hip 10/17/2014  4 units packed red blood cells  Antibiotics:  None  HPI/Subjective: Patient complaining of a headache. No further syncopal episodes.  Objective: Filed Vitals:   10/20/14 1427  BP: 177/76  Pulse: 90  Temp: 98.6 F (37 C)  Resp: 18    Intake/Output Summary (Last 24 hours) at 10/20/14 1609 Last data filed at 10/20/14 1425  Gross per 24 hour  Intake 2677.5 ml  Output   2765 ml  Net  -87.5 ml   Filed Weights   10/18/14 0658 10/19/14 0647 10/20/14 0634  Weight: 73.297 kg (161 lb 9.5 oz) 72.802 kg (160 lb 8 oz) 69.083 kg (152 lb 4.8 oz)    Exam:   General:  NAD  Cardiovascular: RRR  Respiratory: CTAB  Abdomen: Soft, nontender, nondistended, positive bowel sounds.  Musculoskeletal: No clubbing cyanosis or edema.  Data Reviewed: Basic Metabolic Panel:  Recent Labs Lab 10/17/14 0850 10/17/14 0914 10/19/14 0710 10/20/14 0322  NA 137 137 139 140  K 5.1 5.1 4.4 4.5  CL 106 107 111 113*  CO2 19*  --  18* 19*  GLUCOSE 135* 133* 104* 106*  BUN 98* 97* 82* 68*  CREATININE 2.91* 2.80* 2.57* 2.26*  CALCIUM 8.7*  --  8.0* 8.3*   Liver Function Tests:  Recent Labs Lab 10/17/14 0850 10/19/14 1203  AST 28 14*  ALT 15* 16*  ALKPHOS 68 60  BILITOT 0.6 0.7  PROT 5.8* 4.7*  ALBUMIN 3.2* 2.8*   No results for input(s): LIPASE, AMYLASE in the last  168 hours. No results for input(s): AMMONIA in the last 168 hours. CBC:  Recent Labs Lab 10/17/14 0850  10/18/14 0735 10/18/14 1245 10/18/14 2316 10/19/14 0710 10/19/14 1203 10/20/14 0322  WBC 9.0  < > 5.1 7.7 4.9 4.2  --  4.6  NEUTROABS 6.5  --   --   --   --   --    --   --   HGB 8.9*  < > 6.2* 7.5* 6.2* 6.6*  --  8.7*  HCT 29.7*  < > 20.5* 23.8* 19.7* 20.3*  --  26.8*  MCV 94.0  < > 94.0 92.2 92.9 92.3  --  91.5  PLT 504*  < > 260 364 256 225 269 227  < > = values in this interval not displayed. Cardiac Enzymes:  Recent Labs Lab 10/17/14 1232 10/17/14 1820 10/18/14 0045  TROPONINI <0.03 <0.03 <0.03   BNP (last 3 results) No results for input(s): BNP in the last 8760 hours.  ProBNP (last 3 results) No results for input(s): PROBNP in the last 8760 hours.  CBG:  Recent Labs Lab 10/18/14 1148 10/19/14 0611 10/20/14 0559  GLUCAP 120* 117* 114*    Recent Results (from the past 240 hour(s))  Clostridium Difficile by PCR     Status: None   Collection Time: 10/17/14  5:37 PM  Result Value Ref Range Status   C difficile by pcr NEGATIVE NEGATIVE Final     Studies: Mr Brain Wo Contrast  10/20/2014   CLINICAL DATA:  Ataxia.  Syncope and scalp laceration.  EXAM: MRI HEAD WITHOUT CONTRAST  TECHNIQUE: Multiplanar, multiecho pulse sequences of the brain and surrounding structures were obtained without intravenous contrast.  COMPARISON:  CT head 10/17/2014, MRI 08/26/2013  FINDINGS: Moderate atrophy.  Negative for hydrocephalus.  Negative for acute infarct. Mild chronic microvascular ischemic change in the white matter. Small right hemispheric subdural hygroma consistent with CSF. No subdural hemorrhage identified.  Negative for mass or edema.  No shift of the midline structures.  Chronic sinusitis with mucosal edema throughout the paranasal sinuses.  Right sided scalp soft tissue swelling shows interval improvement.  IMPRESSION: Small subdural hygroma on the right. No evidence of subdural hemorrhage  Generalized atrophy. Chronic microvascular ischemic change in the white matter.  Negative for acute infarct  Chronic sinusitis.   Electronically Signed   By: Franchot Gallo M.D.   On: 10/20/2014 11:59    Scheduled Meds: . sodium chloride   Intravenous  Once  . anagrelide  1 mg Oral Daily  . cephALEXin  250 mg Oral 3 times per day  . cloNIDine  0.3 mg Oral TID  . docusate sodium  100 mg Oral BID  . hydrALAZINE  50 mg Oral TID  . metoprolol succinate  50 mg Oral Daily  . multivitamin with minerals   Oral Daily  . pantoprazole  40 mg Oral BID  . sodium chloride  3 mL Intravenous Q12H  . tamsulosin  0.4 mg Oral QPC supper   Continuous Infusions:   Principal Problem:   Syncope and collapse Active Problems:   CAD (coronary artery disease)   Essential thrombocythemia   Anemia due to chronic renal failure treated with erythropoietin   Ischemic cardiomyopathy   Essential hypertension   CKD (chronic kidney disease), stage IV   Arteriovenous malformation of small intestine   Acute blood loss anemia   Laceration of parietal lobe   Syncope   New right bundle branch block (RBBB)   Fall  Time spent: 35 minutes    [facility] Pager [phone]. If 7PM-7AM, please contact night-coverage at [email or web address], password [facility] 10/20/2014, 4:09 PM  LOS: 3 days

## 2014-10-20 NOTE — Care Management Note (Signed)
Case Management Note  Patient Details  Name: [name] MRN: [id number] Date of Birth: 30-Jan-1932  Subjective/Objective:          PT now recommending HH follow up at dc.            Action/Plan: Will follow up with pt/wife on [facility] choice and DME needs for home.    Expected Discharge Date:                  Expected Discharge Plan:  [place]  In-House Referral:     Discharge planning Services  CM Consult  Post Acute Care Choice:    Choice offered to:     DME Arranged:    DME Agency:     HH Arranged:    [place] Agency:     Status of Service:  In process, will continue to follow  Medicare Important Message Given:  Yes Date Medicare IM Given:  10/20/14 Medicare IM give by:  Ellan Lambert, RN, BSN  Date Additional Medicare IM Given:    Additional Medicare Important Message give by:     If discussed at [place] of Stay Meetings, dates discussed:    Additional Comments:  Ella Bodo, RN 10/20/2014, 2:49 PM Phone [phone]

## 2014-10-21 ENCOUNTER — Ambulatory Visit (HOSPITAL_COMMUNITY): Payer: Medicare Other

## 2014-10-21 ENCOUNTER — Other Ambulatory Visit: Payer: Self-pay | Admitting: Physician Assistant

## 2014-10-21 DIAGNOSIS — R55 Syncope and collapse: Secondary | ICD-10-CM

## 2014-10-21 DIAGNOSIS — I10 Essential (primary) hypertension: Secondary | ICD-10-CM

## 2014-10-21 DIAGNOSIS — W19XXXS Unspecified fall, sequela: Secondary | ICD-10-CM

## 2014-10-21 LAB — CBC
HEMATOCRIT: 25.2 % — AB (ref 39.0–52.0)
Hemoglobin: 8.1 g/dL — ABNORMAL LOW (ref 13.0–17.0)
MCH: 30 pg (ref 26.0–34.0)
MCHC: 32.1 g/dL (ref 30.0–36.0)
MCV: 93.3 fL (ref 78.0–100.0)
PLATELETS: 196 10*3/uL (ref 150–400)
RBC: 2.7 MIL/uL — ABNORMAL LOW (ref 4.22–5.81)
RDW: 18.3 % — AB (ref 11.5–15.5)
WBC: 4 10*3/uL (ref 4.0–10.5)

## 2014-10-21 LAB — BASIC METABOLIC PANEL
Anion gap: 8 (ref 5–15)
BUN: 53 mg/dL — ABNORMAL HIGH (ref 6–20)
CHLORIDE: 111 mmol/L (ref 101–111)
CO2: 20 mmol/L — AB (ref 22–32)
CREATININE: 2.07 mg/dL — AB (ref 0.61–1.24)
Calcium: 8.4 mg/dL — ABNORMAL LOW (ref 8.9–10.3)
GFR calc non Af Amer: 29 mL/min — ABNORMAL LOW (ref >60)
GFR, EST AFRICAN AMERICAN: 33 mL/min — AB (ref >60)
Glucose, Bld: 100 mg/dL — ABNORMAL HIGH (ref 65–99)
POTASSIUM: 4.7 mmol/L (ref 3.5–5.1)
SODIUM: 139 mmol/L (ref 135–145)

## 2014-10-21 LAB — GLUCOSE, CAPILLARY: GLUCOSE-CAPILLARY: 91 mg/dL (ref 65–99)

## 2014-10-21 MED ORDER — CEPHALEXIN 250 MG PO CAPS: 250.0000 mg | ORAL_CAPSULE | Freq: Three times a day (TID) | ORAL | Status: DC

## 2014-10-21 MED ORDER — ACETAMINOPHEN-CODEINE #2 300-15 MG PO TABS: 1.0000 | ORAL_TABLET | ORAL | Status: DC | PRN

## 2014-10-21 MED ORDER — METOPROLOL SUCCINATE ER 50 MG PO TB24: 50.0000 mg | ORAL_TABLET | Freq: Every day | ORAL | Status: DC

## 2014-10-21 NOTE — Progress Notes (Signed)
Orders received for pt discharge.  Discharge summary printed and reviewed with pt.  Explained medication regimen, and pt had no further questions at this time.  IV removed and site remains clean, dry, intact.  Telemetry removed.  Pt in stable condition and awaiting transport. 

## 2014-10-21 NOTE — Progress Notes (Signed)
Patient Name: [name] Date of Encounter: 10/21/2014  Primary Cardiologist: Dr. Aundra Dubin   Principal Problem:   Syncope and collapse Active Problems:   CAD (coronary artery disease)   Essential thrombocythemia   Anemia due to chronic renal failure treated with erythropoietin   Ischemic cardiomyopathy   Essential hypertension   CKD (chronic kidney disease), stage IV   Arteriovenous malformation of small intestine   Acute blood loss anemia   Laceration of parietal lobe   Syncope   New right bundle branch block (RBBB)   Fall   Laceration of head    SUBJECTIVE  Denies any discomfort. States he walked with a walker earlier.   CURRENT MEDS . sodium chloride   Intravenous Once  . anagrelide  1 mg Oral Daily  . cephALEXin  250 mg Oral 3 times per day  . cloNIDine  0.3 mg Oral TID  . docusate sodium  100 mg Oral BID  . hydrALAZINE  50 mg Oral TID  . metoprolol succinate  50 mg Oral Daily  . multivitamin with minerals   Oral Daily  . pantoprazole  40 mg Oral BID  . sodium chloride  3 mL Intravenous Q12H  . tamsulosin  0.4 mg Oral QPC supper    OBJECTIVE  Filed Vitals:   10/20/14 1427 10/20/14 2017 10/21/14 0428 10/21/14 1059  BP: 177/76 179/78 143/68 157/73  Pulse: 90 84 95 79  Temp: 98.6 F (37 C) 98.5 F (36.9 C) 98.5 F (36.9 C)   TempSrc: Oral Oral Oral   Resp: 18 18 18    Height:      Weight:   150 lb 5.7 oz (68.2 kg)   SpO2: 96% 96% 94%     Intake/Output Summary (Last 24 hours) at 10/21/14 1358 Last data filed at 10/21/14 0829  Gross per 24 hour  Intake    870 ml  Output   1700 ml  Net   -830 ml   Filed Weights   10/19/14 0647 10/20/14 0634 10/21/14 0428  Weight: 160 lb 8 oz (72.802 kg) 152 lb 4.8 oz (69.083 kg) 150 lb 5.7 oz (68.2 kg)    PHYSICAL EXAM  General: Pleasant, NAD. Neuro: Alert and oriented X 3. Moves all extremities spontaneously. Psych: Normal affect. HEENT:  Normal. R forehead laceration, no obvious bleeding Neck: Supple  without bruits or JVD. Lungs:  Resp regular and unlabored, CTA. Heart: RRR no s3, s4, or murmurs. Abdomen: Soft, non-tender, non-distended, BS + x 4.  Extremities: No clubbing, cyanosis or edema. DP/PT/Radials 2+ and equal bilaterally.  Accessory Clinical Findings  CBC  Recent Labs  10/20/14 0322 10/21/14 0513  WBC 4.6 4.0  HGB 8.7* 8.1*  HCT 26.8* 25.2*  MCV 91.5 93.3  PLT 227 761   Basic Metabolic Panel  Recent Labs  10/20/14 0322 10/21/14 0513  NA 140 139  K 4.5 4.7  CL 113* 111  CO2 19* 20*  GLUCOSE 106* 100*  BUN 68* 53*  CREATININE 2.26* 2.07*  CALCIUM 8.3* 8.4*   Liver Function Tests  Recent Labs  10/19/14 1203  AST 14*  ALT 16*  ALKPHOS 60  BILITOT 0.7  PROT 4.7*  ALBUMIN 2.8*   Cardiac Enzymes No results for input(s): CKTOTAL, CKMB, CKMBINDEX, TROPONINI in the last 72 hours. Fasting Lipid Panel No results for input(s): CHOL, HDL, LDLCALC, TRIG, CHOLHDL, LDLDIRECT in the last 72 hours.  TELE NSR with HR 50-60s, no significant ventricular ectopy    ECG  No new EKG  Echocardiogram  10/18/2014 Study Conclusions  - Left ventricle: The cavity size was at the upper limits of normal. Wall thickness was normal. Systolic function was normal. The estimated ejection fraction was in the range of 55% to 60%. - Ventricular septum: Septal motion showed paradox. - Left atrium: The atrium was severely dilated. - Right ventricle: The cavity size was mildly dilated. Wall thickness was normal. - Atrial septum: There was an atrial septal aneurysm.    Radiology/Studies  X-ray Chest Pa And Lateral  10/17/2014   CLINICAL DATA:  Syncope.  Fall.  Initial encounter.  EXAM: CHEST  2 VIEW  COMPARISON:  01/14/2013 and prior chest radiographs  FINDINGS: Cardiomegaly and CABG changes noted.  Mild pulmonary vascular congestion is identified.  Bilateral lower lobe opacities noted -question atelectasis versus airspace disease.  There is no evidence of pulmonary  edema, suspicious pulmonary nodule/mass, pleural effusion, or pneumothorax. No acute bony abnormalities are identified.  IMPRESSION: Bilateral lower lobe opacities -question atelectasis versus airspace disease/pneumonia.  Cardiomegaly and mild pulmonary vascular congestion.       ASSESSMENT AND PLAN  81 year old gentleman with history of HTN, CAD s/p CABG 2006 and chronic kidney disease stage IV admitted after syncopal episode at home. He suffered a large laceration of the right temporal area.  1. Syncope with unclear etiology  - BP Lying 146/72 HR 72. Sitting 160/58 HR 68. Standing 172/84 HR 72  - no obvious orthostatic hypotension. Serial trop negative  - Echo shows 55-60%   - Pending carotid U/S and MRI of the brain showed Generalized atrophy. Chronic microvascular ischemic change in the white matter. Negative for acute infarctof brain.   - while in the hospital, telemetry unrevealing, no arrhythmias, other than PVCs and no pauses  - we will arrange for an outpatient follow up  2. Acute on chronic anemia   - Hgb 10.2 on arrival --> 7.7 --> 6.2--> 8.1 today, s/p transfusions  - Anemia workup for IM, likely result of R scalp laceration   3. Intermittent RBBB  - no RBBB seen on telemetry, however EKG showed widened QRS  - Metoprolol XL from 100mg  BID to 50mg  daily  4. CAD s/p CABG 2006  5. ICM with baseline EF 45-50% on echo in 07/2011  - Echo 10/18/14 shows 55-60%  6. HTN  -Cont toprol-xl and hydralizine.  7. CKD stage IV  -Cr 2.91 --> 2.80 --> 2.73 Today   81 year old male admitted with syncope, now with worsening anemia, on transfusion with PRBCs. No obvious reason for syncope found, telemetry is unremarkable. We will arrange for an outpatient e-cardio monitor at the time of discharge. He appears euvolemic, we will monitor especially after receiving transfusions.   He can be discharged from cardiac standpoint.   Dorothy Spark, MD 10/21/2014

## 2014-10-21 NOTE — Progress Notes (Signed)
*  PRELIMINARY RESULTS* Vascular Ultrasound Carotid Duplex (Doppler) has been completed.  Preliminary findings: Bilateral:  1-39% ICA stenosis, borderline >40%.  Vertebral artery flow is antegrade.      Landry Mellow, RDMS, RVT  10/21/2014, 4:31 PM

## 2014-10-21 NOTE — Progress Notes (Signed)
Talked to patient about [place] choices, patient refused all [place] services at this time. CM informed patient that if he later wants [facility] after discharge to notify his PCP; Patient lives with spouse and they are working on getting a personal care service ( [facility]) to help assist in his care. RW ordered from [facility] and to be delivered to his room today prior to discharge home: Aneta Mins [phone]

## 2014-10-21 NOTE — Discharge Summary (Signed)
Physician Discharge Summary  NUCHEM GRATTAN IBB:[id number] DOB: 1931-08-20 DOA: 10/17/2014  PCP: [name], MD  Admit date: 10/17/2014 Discharge date: 10/21/2014  Time spent: 65 minutes  Recommendations for Outpatient Follow-up:  1. Follow up with [name], MD in 1 week. All follow-up patient will need a CBC done to follow-up on his H&H. Patient need a basic metabolic profile done to follow-up on electrolytes and renal function. 2. Patient will be contacted per cardiology to be set up for an event monitor. 3. Patient will follow-up with cardiology as scheduled. 4. Patient is to follow-up with Dr. Benay Spice of hematology/ oncology as scheduled.  Discharge Diagnoses:  Principal Problem:   Syncope and collapse Active Problems:   CAD (coronary artery disease)   Essential thrombocythemia   Anemia due to chronic renal failure treated with erythropoietin   Ischemic cardiomyopathy   Essential hypertension   CKD (chronic kidney disease), stage IV   Arteriovenous malformation of small intestine   Acute blood loss anemia   Laceration of parietal lobe   Syncope   New right bundle branch block (RBBB)   Fall   Laceration of head   Discharge Condition: Stable and improved  Diet recommendation: Heart healthy.  Filed Weights   10/19/14 0647 10/20/14 0634 10/21/14 0428  Weight: 72.802 kg (160 lb 8 oz) 69.083 kg (152 lb 4.8 oz) 68.2 kg (150 lb 5.7 oz)    History of present illness:  Per Dr. Carles Collet This is a 81 year old male patient with chronic kidney disease stage IV anemia of chronic renal failure on erythropoietin as well as thrombocythemia on Agrylin, history of CAD and associated ischemic cardiomyopathy last echo in 2013 with an EF of 45%, he also has pulmonary hypertension 51 mmHg with associated moderate TR, mild RV dilatation, and AV fistula has ordered then created, he has has a history of erosive esophagitis and esophageal stricture. Patient presented to the ER after a  syncopal episode with collapse at home. Initially reported that episode was unwitnessed and patient unable to recall what had happened. Patient did not recall what happened the patient's wife did see patient walked to the bathroom and suddenly collapsed to the floor. Patient was out for about 2 minutes and then awakened completely normal and had no further neurological events. During the fall patient hit his head and sustained a significant and deep laceration and experienced significant bleeding from the 4 head in the field and after arrival to the ER. ER physician reported complex laceration that required not only vessel ligation but closure with sutures. Dr. Buelah Manis was consulted for complex wound closure. In talking with the patient and his other family members patient has had no issues recently with chest pain, dyspnea on exertion, palpitations, no GI symptoms such as nausea vomiting diarrhea. He recently underwent a capsule endoscopy that showed small intestinal AVMs currently not bleeding. Patient reported he worked outside the day prior to admission, and felt he may become somewhat dehydrated.  Upon arrival to the ER patient was afebrile, heart rate in the 60s, BP 173/78, room air saturations 94%, as noted above patient was having significant bleeding from the right scalp laceration mostly controlled prior to laceration with compression. BUN 97 and creatinine 2.8. Glucose 133. Troponin 0.01, lactic acid 1.66, initial hemoglobin 10.2 noting baseline around 8.9 to 7.2. White count 11,000 and repeat hemoglobin 7.7. EKG showed new right bundle branch block. CT head unremarkable except for visualization of right frontoparietal scalp swelling with hematoma. CT of the neck without  any acute injury. Right hip x-ray unremarkable. Upon admitting physician, Dr. Carles Collet exam patient denied any weakness or numbness in the extremities. Minor headache site of laceration  Hospital Course:  #1 syncopal episode Unknown  etiology. Patient was admitted with a syncopal episode. Syncope workup was undertaken. Patient was placed on telemetry. Telemetry was significant frequent PVCs. Patient was not orthostatic. CT head negative. MRI head negative. Carotid ultrasound was done with no significant ICA stenosis per preliminary reading. 2-D echo with EF of 55-60% with severely dilated left atrium and it shows septal aneurysm. Home dose metoprolol has been decreased. Patient will likely need an outpatient event monitor as workup has been negative today. Cardiology followed the patient during the hospitalization and patient be set up for outpatient follow-up with cardiology.  #2 acute blood loss anemia from head laceration and bleed/chronic anemia being followed by hematology receiving outpatient and aranesp and iron transfusions. Patient was noted to be anemic during the hospitalization felt to be secondary to head laceration as well as chronic anemia. Patient received a total of 4 units of packed red blood cells during the hospitalization. Hematology was consulted and followed the patient during the hospitalization and recommended IV iron. Patient was given a dose of IV Feraheme 510 mg 1. DIC and hemolytic anemia workup which was done was negative. Patient's hemoglobin stabilized and was 8.1 by day of discharge. Patient will follow-up with hematology/ oncology as outpatient in one week and will also follow-up with PCP as outpatient.   #3 lateral temporoparietal laceration Status post suturing. Patient was placed on Keflex twice laceration. Patient will be discharged on 5 days of oral Keflex to complete a course of antibiotic therapy. Outpatient follow-up.   #4 coronary artery disease/new right bundle branch block Cardiac enzymes negative. Patient denied any chest pain. 2-D echo with EF of 55-60%. Patient was seen in consultation by cardiology and followed throughout the hospitalization. Patient will follow-up with cardiology as  outpatient.   #5 ischemic cardiomyopathy Patient is currently stable. No signs of volume overload. Follow.  #6 essential thrombocytopenia Patient was seen by hematology during the hospitalization and followed.   #7 hypertension Patient was maintained on clonidine, hydralazine, metoprolol, Flomax during the hospitalization.  #8 chronic kidney disease stage IV Stable.  #9 AVM of small intestine Currently no signs of active bleeding. Hemoccult negative. Follow.   Procedures:  CT at 10/17/2014  CT C-spine 10/17/2014  Chest x-ray 10/17/2014  MRI head 10/20/2014  2-D echo 10/18/2014  X-ray of the hip 10/17/2014  4 units packed red blood cells  Consultations:  Dental surgery: Dr. Sabra Heck 10/17/2014  Cardiology: Dr. Mare Ferrari 10/17/2014  Inpatient rehabilitation 10/19/2014  Oncology: Dr. Burr Medico 10/19/2014    Discharge Exam: Filed Vitals:   10/21/14 1447  BP: 147/66  Pulse: 83  Temp: 98.5 F (36.9 C)  Resp: 18    General: NAD Cardiovascular: RRR Respiratory: CTAB  Discharge Instructions   Discharge Instructions    Diet - low sodium heart healthy    Complete by:  As directed      Discharge instructions    Complete by:  As directed   Follow up with Dr Benay Spice as scheduled next week. Follow up with [name], MD in 1 week. Follow up with cardiology as scheduled.     Increase activity slowly    Complete by:  As directed           Current Discharge Medication List    START taking these medications   Details  cephALEXin (KEFLEX) 250 MG capsule Take 1 capsule (250 mg total) by mouth every 8 (eight) hours. Take for 5 days then stop. Qty: 15 capsule, Refills: 0      CONTINUE these medications which have CHANGED   Details  acetaminophen-codeine (TYLENOL #2) 300-15 MG per tablet Take 1 tablet by mouth every 4 (four) hours as needed for moderate pain. Qty: 20 tablet, Refills: 0    metoprolol succinate (TOPROL-XL) 50 MG 24 hr tablet Take 1 tablet  (50 mg total) by mouth daily. Take with or immediately following a meal. Qty: 30 tablet, Refills: 0      CONTINUE these medications which have NOT CHANGED   Details  acyclovir (ZOVIRAX) 200 MG capsule Take 200 mg by mouth as directed. Refills: 4    anagrelide (AGRYLIN) 1 MG capsule TAKE ONE CAPSULE BY MOUTH ONCE DAILY OR AS DIRECTED BY PHYSICIAN. Qty: 30 capsule, Refills: 0   Associated Diagnoses: Thrombocythemia, essential    aspirin EC 81 MG tablet Take 81 mg by mouth daily.    cloNIDine (CATAPRES) 0.3 MG tablet Take 0.6 mg by mouth 3 (three) times daily. Refills: 0    furosemide (LASIX) 40 MG tablet Take 40 mg by mouth 2 (two) times daily.     hydrALAZINE (APRESOLINE) 25 MG tablet take 1 tablet by mouth three times a day Qty: 90 tablet, Refills: 10    Multiple Vitamins-Minerals (MULTIVITAMIN ADULT) TABS Take 1 tablet by mouth daily.    pantoprazole (PROTONIX) 40 MG tablet Take 1 tablet (40 mg total) by mouth 2 (two) times daily. Qty: 60 tablet, Refills: 3    Tamsulosin HCl (FLOMAX) 0.4 MG CAPS Take 0.4 mg by mouth daily after supper.       STOP taking these medications     ferrous sulfate 325 (65 FE) MG EC tablet        Allergies  Allergen Reactions  . Isosorbide Other (See Comments)    Caused a blood clot  . Norvasc [Amlodipine Besylate] Other (See Comments)    TIA's   Follow-up Information    Follow up with Heart Monitor On 10/25/2014.   Why:  4:00 PM   Contact information:   [address] [place] [address] [phone]      Follow up with [facility], NP On 11/04/2014.   Specialties:  Cardiology, Radiology   Why:  10:00 AM   Contact information:   [place] [address] [phone]       Follow up with [name], MD. Schedule an appointment as soon as possible for a visit in 1 week.   Specialty:  Internal Medicine   Contact information:   [place] [address] [phone]         The results of significant diagnostics from this hospitalization (including imaging, microbiology, ancillary and laboratory) are listed below for reference.    Significant Diagnostic Studies: X-ray Chest Pa And Lateral  10/17/2014   CLINICAL DATA:  Syncope.  Fall.  Initial encounter.  EXAM: CHEST  2 VIEW  COMPARISON:  01/14/2013 and prior chest radiographs  FINDINGS: Cardiomegaly and CABG changes noted.  Mild pulmonary vascular congestion is identified.  Bilateral lower lobe opacities noted -question atelectasis versus airspace disease.  There is no evidence of pulmonary edema, suspicious pulmonary nodule/mass, pleural effusion, or pneumothorax. No acute bony abnormalities are identified.  IMPRESSION: Bilateral lower lobe opacities -question atelectasis versus airspace disease/pneumonia.  Cardiomegaly and mild pulmonary vascular congestion.  Electronically Signed   By: Margarette Canada M.D.   On: 10/17/2014 16:32   Ct Head Wo Contrast  10/17/2014   CLINICAL DATA:  Status post fall on the bathroom with laceration to the right parietal and occipital region. Patient has a history of prostate cancer.  EXAM: CT HEAD WITHOUT CONTRAST  CT CERVICAL SPINE WITHOUT CONTRAST  TECHNIQUE: Multidetector CT imaging of the head and cervical spine was performed following the standard protocol without intravenous contrast. Multiplanar CT image reconstructions of the cervical spine were also generated.  COMPARISON:  MR brain August 26, 2013  FINDINGS: CT HEAD FINDINGS  There is chronic diffuse atrophy. There is no midline shift, hydrocephalus, or mass. No acute hemorrhage or acute transcortical infarct is identified. The bony calvarium is intact. There is right frontal parietal scalp swelling and hematoma. Complete opacity of bilateral maxillary sinuses are identified.  CT CERVICAL SPINE FINDINGS  There is no acute fracture or dislocation. Degenerative joint changes of the spine are identified. The prevertebral soft tissues  are normal. There is a 1.4 x 0.7 cm lytic lesion in the anterior left C1.  IMPRESSION: Right frontal/ parietal scalp swelling and hematoma. No intracranial hemorrhage is identified.  No acute fracture or dislocation of cervical spine.   Electronically Signed   By: Abelardo Diesel M.D.   On: 10/17/2014 10:19   Ct Cervical Spine Wo Contrast  10/17/2014   CLINICAL DATA:  Status post fall on the bathroom with laceration to the right parietal and occipital region. Patient has a history of prostate cancer.  EXAM: CT HEAD WITHOUT CONTRAST  CT CERVICAL SPINE WITHOUT CONTRAST  TECHNIQUE: Multidetector CT imaging of the head and cervical spine was performed following the standard protocol without intravenous contrast. Multiplanar CT image reconstructions of the cervical spine were also generated.  COMPARISON:  MR brain August 26, 2013  FINDINGS: CT HEAD FINDINGS  There is chronic diffuse atrophy. There is no midline shift, hydrocephalus, or mass. No acute hemorrhage or acute transcortical infarct is identified. The bony calvarium is intact. There is right frontal parietal scalp swelling and hematoma. Complete opacity of bilateral maxillary sinuses are identified.  CT CERVICAL SPINE FINDINGS  There is no acute fracture or dislocation. Degenerative joint changes of the spine are identified. The prevertebral soft tissues are normal. There is a 1.4 x 0.7 cm lytic lesion in the anterior left C1.  IMPRESSION: Right frontal/ parietal scalp swelling and hematoma. No intracranial hemorrhage is identified.  No acute fracture or dislocation of cervical spine.   Electronically Signed   By: Abelardo Diesel M.D.   On: 10/17/2014 10:19   Mr Brain Wo Contrast  10/20/2014   CLINICAL DATA:  Ataxia.  Syncope and scalp laceration.  EXAM: MRI HEAD WITHOUT CONTRAST  TECHNIQUE: Multiplanar, multiecho pulse sequences of the brain and surrounding structures were obtained without intravenous contrast.  COMPARISON:  CT head 10/17/2014, MRI 08/26/2013   FINDINGS: Moderate atrophy.  Negative for hydrocephalus.  Negative for acute infarct. Mild chronic microvascular ischemic change in the white matter. Small right hemispheric subdural hygroma consistent with CSF. No subdural hemorrhage identified.  Negative for mass or edema.  No shift of the midline structures.  Chronic sinusitis with mucosal edema throughout the paranasal sinuses.  Right sided scalp soft tissue swelling shows interval improvement.  IMPRESSION: Small subdural hygroma on the right. No evidence of subdural hemorrhage  Generalized atrophy. Chronic microvascular ischemic change in the white matter.  Negative for acute infarct  Chronic sinusitis.  Electronically Signed   By: Franchot Gallo M.D.   On: 10/20/2014 11:59   Dg Abd 2 Views  09/22/2014   CLINICAL DATA:  Assess for retained endoscopic camera capsule  EXAM: ABDOMEN - 2 VIEW  COMPARISON:  None.  FINDINGS: There is moderately increased stool burden throughout the colon. No radiopaque camera capsule is demonstrated. There is no small or large bowel obstructive pattern. There are degenerative changes of the lumbar spine with gentle dextrocurvature  IMPRESSION: No retained endoscopic capsule is demonstrated. Increase colonic stool burden may reflect clinical constipation.   Electronically Signed   By: David  Martinique M.D.   On: 09/22/2014 13:13   Dg Hip Port Unilat With Pelvis 1v Right  10/17/2014   CLINICAL DATA:  81 year old male with fall on right hip this morning with right hip pain. Initial encounter.  EXAM: RIGHT HIP (WITH PELVIS) 1 VIEW PORTABLE  COMPARISON:  None.  FINDINGS: There is no evidence of hip fracture or dislocation. There is no evidence of arthropathy or other focal bone abnormality.  IMPRESSION: Negative.   Electronically Signed   By: Margarette Canada M.D.   On: 10/17/2014 10:34    Microbiology: Recent Results (from the past 240 hour(s))  Clostridium Difficile by PCR     Status: None   Collection Time: 10/17/14  5:37 PM   Result Value Ref Range Status   C difficile by pcr NEGATIVE NEGATIVE Final     Labs: Basic Metabolic Panel:  Recent Labs Lab 10/17/14 0850 10/17/14 0914 10/19/14 0710 10/20/14 0322 10/21/14 0513  NA 137 137 139 140 139  K 5.1 5.1 4.4 4.5 4.7  CL 106 107 111 113* 111  CO2 19*  --  18* 19* 20*  GLUCOSE 135* 133* 104* 106* 100*  BUN 98* 97* 82* 68* 53*  CREATININE 2.91* 2.80* 2.57* 2.26* 2.07*  CALCIUM 8.7*  --  8.0* 8.3* 8.4*   Liver Function Tests:  Recent Labs Lab 10/17/14 0850 10/19/14 1203  AST 28 14*  ALT 15* 16*  ALKPHOS 68 60  BILITOT 0.6 0.7  PROT 5.8* 4.7*  ALBUMIN 3.2* 2.8*   No results for input(s): LIPASE, AMYLASE in the last 168 hours. No results for input(s): AMMONIA in the last 168 hours. CBC:  Recent Labs Lab 10/17/14 0850  10/18/14 1245 10/18/14 2316 10/19/14 0710 10/19/14 1203 10/20/14 0322 10/21/14 0513  WBC 9.0  < > 7.7 4.9 4.2  --  4.6 4.0  NEUTROABS 6.5  --   --   --   --   --   --   --   HGB 8.9*  < > 7.5* 6.2* 6.6*  --  8.7* 8.1*  HCT 29.7*  < > 23.8* 19.7* 20.3*  --  26.8* 25.2*  MCV 94.0  < > 92.2 92.9 92.3  --  91.5 93.3  PLT 504*  < > 364 256 225 269 227 196  < > = values in this interval not displayed. Cardiac Enzymes:  Recent Labs Lab 10/17/14 1232 10/17/14 1820 10/18/14 0045  TROPONINI <0.03 <0.03 <0.03   BNP: BNP (last 3 results) No results for input(s): BNP in the last 8760 hours.  ProBNP (last 3 results) No results for input(s): PROBNP in the last 8760 hours.  CBG:  Recent Labs Lab 10/18/14 1148 10/19/14 0611 10/20/14 0559 10/21/14 0555  GLUCAP 120* 117* 114* 91       Signed:  THOMPSON,DANIEL MD [facility] 10/21/2014, 5:21 PM

## 2014-10-21 NOTE — Progress Notes (Signed)
Order for bilateral ultrasound for carotid arteries was entered incorrectly as being done in "ultrasound" but is actually done in vascular, and therefore, pt was never listed for this test to be performed.  Spoke with Dr. Grandville Silos, and as he still wants test performed prior to pt's discharge, the order was re-entered correctly as "Carotid Vascular Ultrasound." The test will be performed.

## 2014-10-25 ENCOUNTER — Telehealth: Payer: Self-pay | Admitting: Oncology

## 2014-10-25 NOTE — Telephone Encounter (Signed)
S/w pt's wife confirming r/s from 05/24 to 05/25 per provider schedule..... Pt is a Fall Risk... KJ

## 2014-10-26 ENCOUNTER — Ambulatory Visit: Payer: Medicare Other

## 2014-10-26 ENCOUNTER — Other Ambulatory Visit: Payer: Medicare Other

## 2014-10-26 ENCOUNTER — Ambulatory Visit: Payer: Medicare Other | Admitting: Oncology

## 2014-10-27 ENCOUNTER — Other Ambulatory Visit (HOSPITAL_BASED_OUTPATIENT_CLINIC_OR_DEPARTMENT_OTHER): Payer: Medicare Other

## 2014-10-27 ENCOUNTER — Ambulatory Visit (HOSPITAL_BASED_OUTPATIENT_CLINIC_OR_DEPARTMENT_OTHER): Payer: Medicare Other | Admitting: Oncology

## 2014-10-27 ENCOUNTER — Ambulatory Visit (HOSPITAL_BASED_OUTPATIENT_CLINIC_OR_DEPARTMENT_OTHER): Payer: Medicare Other

## 2014-10-27 ENCOUNTER — Telehealth: Payer: Self-pay | Admitting: Oncology

## 2014-10-27 ENCOUNTER — Other Ambulatory Visit: Payer: Self-pay | Admitting: *Deleted

## 2014-10-27 VITALS — BP 130/49 | HR 57 | Temp 97.6°F | Resp 19 | Ht 71.0 in | Wt 157.9 lb

## 2014-10-27 DIAGNOSIS — N184 Chronic kidney disease, stage 4 (severe): Secondary | ICD-10-CM | POA: Diagnosis not present

## 2014-10-27 DIAGNOSIS — D631 Anemia in chronic kidney disease: Secondary | ICD-10-CM

## 2014-10-27 DIAGNOSIS — N189 Chronic kidney disease, unspecified: Principal | ICD-10-CM

## 2014-10-27 DIAGNOSIS — D649 Anemia, unspecified: Secondary | ICD-10-CM

## 2014-10-27 DIAGNOSIS — D473 Essential (hemorrhagic) thrombocythemia: Secondary | ICD-10-CM

## 2014-10-27 LAB — CBC WITH DIFFERENTIAL/PLATELET
BASO%: 1.2 % (ref 0.0–2.0)
Basophils Absolute: 0 10*3/uL (ref 0.0–0.1)
EOS%: 4.1 % (ref 0.0–7.0)
Eosinophils Absolute: 0.2 10*3/uL (ref 0.0–0.5)
HEMATOCRIT: 25.8 % — AB (ref 38.4–49.9)
HGB: 8.2 g/dL — ABNORMAL LOW (ref 13.0–17.1)
LYMPH#: 0.5 10*3/uL — AB (ref 0.9–3.3)
LYMPH%: 11.9 % — AB (ref 14.0–49.0)
MCH: 29.6 pg (ref 27.2–33.4)
MCHC: 31.6 g/dL — ABNORMAL LOW (ref 32.0–36.0)
MCV: 93.8 fL (ref 79.3–98.0)
MONO#: 0.4 10*3/uL (ref 0.1–0.9)
MONO%: 10 % (ref 0.0–14.0)
NEUT#: 2.9 10*3/uL (ref 1.5–6.5)
NEUT%: 72.8 % (ref 39.0–75.0)
Platelets: 201 10*3/uL (ref 140–400)
RBC: 2.75 10*6/uL — ABNORMAL LOW (ref 4.20–5.82)
RDW: 18 % — ABNORMAL HIGH (ref 11.0–14.6)
WBC: 4 10*3/uL (ref 4.0–10.3)

## 2014-10-27 LAB — HOLD TUBE, BLOOD BANK

## 2014-10-27 LAB — FERRITIN CHCC: FERRITIN: 376 ng/mL — AB (ref 22–316)

## 2014-10-27 MED ORDER — ACETAMINOPHEN-CODEINE #2 300-15 MG PO TABS: 1.0000 | ORAL_TABLET | ORAL | Status: DC | PRN

## 2014-10-27 MED ORDER — DARBEPOETIN ALFA 300 MCG/0.6ML IJ SOSY
300.0000 ug | PREFILLED_SYRINGE | Freq: Once | INTRAMUSCULAR | Status: AC
  Administered 2014-10-27: 300 ug via SUBCUTANEOUS
  Filled 2014-10-27: qty 0.6

## 2014-10-27 NOTE — Progress Notes (Signed)
  [facility] OFFICE PROGRESS NOTE   Diagnosis: Essential thrombocytosis, anemia  INTERVAL HISTORY:   [name] has been followed by Drs. [place] and [place] for management of essential thrombocytosis and anemia. He was recently admitted after a syncopal event with an associated fall/head laceration. He was transfused with packed red blood cells while hospitalized and received IV iron. He was discharged 10/21/2014. He was last transfused packed blood cells on 10/19/2014. [name] denies dyspnea and fatigue today. He denies bleeding. He underwent a capsule endoscopy in April that confirmed AVMs. He reports being prescribed oral iron by Dr. Deatra Ina. He last received Aranesp on 10/27/2014.  Objective:  Vital signs in last 24 hours:  Blood pressure 130/49, pulse 57, temperature 97.6 F (36.4 C), temperature source Oral, resp. rate 19, height $RemoveBe'5\' 11"'[id number]$  (1.803 m), weight 157 lb 14.4 oz (71.623 kg), SpO2 96 %.    HEENT: Neck without mass Lymphatics: No cervical, supra-clavicular, or axillary nodes Resp: Distant breath sounds, end inspiratory rales, no respiratory distress Cardio: Regular rate and rhythm GI: No hepatosplenomegaly Vascular: No leg edema     Lab Results:  Lab Results  Component Value Date   WBC 4.0 10/27/2014   HGB 8.2* 10/27/2014   HCT 25.8* 10/27/2014   MCV 93.8 10/27/2014   PLT 201 10/27/2014   NEUTROABS 2.9 10/27/2014   Blood smear: The platelets appear normal in number, scattered large platelets. The white cell morphology is unremarkable. There are a moderate number of ovalocytes and teardrops. The polychromasia is not increased. No nucleated red cells.   Medications: I have reviewed the patient's current medications.  Assessment/Plan:  1. Essential thrombocytosis, long-standing, maintained on anagrelide 2. Severe anemia 3. Chronic renal failure 4. History of AVMs confirmed on a capsule endoscopy April 2016 5. Gastroesophageal reflux  disease 6. Coronary artery disease 7. Movement disorder 8. History of iron deficiency Disposition:  [name] was diagnosed with essential thrombocytosis in 1995. 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 The anemia may be secondary to evolution of the myeloproliferative disorder in addition to chronic GI bleeding and renal failure.  I recommended proceeding with a diagnostic bone marrow biopsy to look for evidence of myelofibrosis and myelodysplasia. He wants to discuss this with his wife prior to committing to a bone marrow biopsy.  He does not feel in the liver red cell transfusion today. He will continue oral iron and every 2 week Aranesp. We will check iron stores when he returns in 2 weeks.  He will return for an office visit in 4 weeks.  Betsy Coder, MD  10/27/2014  3:09 PM

## 2014-10-27 NOTE — Telephone Encounter (Signed)
Left message on voicemail for pt to call office. He left his prescription bottle in the exam room #5 tablets remaining. Pt also requested refill on Tylenol #2. Rx will be left in prescription book for pick up.  Per Dr. Benay Spice: Pt to take Ferrous Sulfate 325 mg BID.

## 2014-10-27 NOTE — Telephone Encounter (Signed)
Pt confirmed labs/ov per 05/25 POF, gave pt AVS and Calendar.... KJ °

## 2014-10-28 NOTE — Telephone Encounter (Signed)
PATIENTS WIFE CALLED reporting the pharmacy has not received the prescription order for the prescription left in our office.  Instructed to come to [facility] with picture ID to signe for this prescription and to pick up the bottle left in exam room.

## 2014-11-02 ENCOUNTER — Telehealth: Payer: Self-pay | Admitting: Gastroenterology

## 2014-11-02 ENCOUNTER — Other Ambulatory Visit: Payer: Self-pay

## 2014-11-02 ENCOUNTER — Other Ambulatory Visit: Payer: Self-pay | Admitting: Oncology

## 2014-11-02 ENCOUNTER — Telehealth: Payer: Self-pay | Admitting: *Deleted

## 2014-11-02 DIAGNOSIS — D473 Essential (hemorrhagic) thrombocythemia: Secondary | ICD-10-CM

## 2014-11-02 MED ORDER — FERROUS SULFATE 325 (65 FE) MG PO TABS: 325.0000 mg | ORAL_TABLET | Freq: Three times a day (TID) | ORAL | Status: DC

## 2014-11-02 NOTE — Telephone Encounter (Signed)
Message from pt stating he agrees to bone marrow biopsy. Dr. Benay Spice made aware. Pt reports he began ferrous sulfate $RemoveBeforeD'325mg'[id number]$  TID about a week ago (prescribed by GI).

## 2014-11-02 NOTE — Telephone Encounter (Signed)
The prescription for his ferrous sulfate did not have a 30 day quantity. New Rx escribed to his pharmacy.

## 2014-11-04 ENCOUNTER — Telehealth: Payer: Self-pay | Admitting: *Deleted

## 2014-11-04 ENCOUNTER — Encounter: Payer: Medicare Other | Admitting: Cardiology

## 2014-11-04 NOTE — Telephone Encounter (Signed)
Message from pt's wife requesting a return call. She wonders if he needs bone marrow biopsy. Thinks his HGB is low because of the fall and bleeding he had when he went to ED. Informed her this information was available to Dr. Benay Spice during office visit. Wife had several questions about bone marrow procedure. Questions were answered.

## 2014-11-09 ENCOUNTER — Other Ambulatory Visit: Payer: Self-pay | Admitting: Radiology

## 2014-11-10 ENCOUNTER — Encounter (HOSPITAL_COMMUNITY): Payer: Self-pay

## 2014-11-10 ENCOUNTER — Ambulatory Visit (HOSPITAL_COMMUNITY)
Admission: RE | Admit: 2014-11-10 | Discharge: 2014-11-10 | Disposition: A | Discharge status: Home / Self Care | Payer: Medicare Other | Source: Ambulatory Visit | Attending: Oncology | Admitting: Oncology

## 2014-11-10 ENCOUNTER — Other Ambulatory Visit: Payer: Self-pay | Admitting: Hematology

## 2014-11-10 DIAGNOSIS — Z7982 Long term (current) use of aspirin: Secondary | ICD-10-CM | POA: Insufficient documentation

## 2014-11-10 DIAGNOSIS — I129 Hypertensive chronic kidney disease with stage 1 through stage 4 chronic kidney disease, or unspecified chronic kidney disease: Secondary | ICD-10-CM | POA: Insufficient documentation

## 2014-11-10 DIAGNOSIS — I2510 Atherosclerotic heart disease of native coronary artery without angina pectoris: Secondary | ICD-10-CM | POA: Diagnosis not present

## 2014-11-10 DIAGNOSIS — Z86718 Personal history of other venous thrombosis and embolism: Secondary | ICD-10-CM | POA: Diagnosis not present

## 2014-11-10 DIAGNOSIS — Z79899 Other long term (current) drug therapy: Secondary | ICD-10-CM | POA: Insufficient documentation

## 2014-11-10 DIAGNOSIS — D649 Anemia, unspecified: Secondary | ICD-10-CM | POA: Diagnosis not present

## 2014-11-10 DIAGNOSIS — Z951 Presence of aortocoronary bypass graft: Secondary | ICD-10-CM | POA: Insufficient documentation

## 2014-11-10 DIAGNOSIS — K219 Gastro-esophageal reflux disease without esophagitis: Secondary | ICD-10-CM | POA: Diagnosis not present

## 2014-11-10 DIAGNOSIS — D473 Essential (hemorrhagic) thrombocythemia: Secondary | ICD-10-CM | POA: Diagnosis not present

## 2014-11-10 DIAGNOSIS — N189 Chronic kidney disease, unspecified: Secondary | ICD-10-CM | POA: Insufficient documentation

## 2014-11-10 DIAGNOSIS — Z87891 Personal history of nicotine dependence: Secondary | ICD-10-CM | POA: Insufficient documentation

## 2014-11-10 LAB — CBC
HCT: 33.3 % — ABNORMAL LOW (ref 39.0–52.0)
HEMOGLOBIN: 10.5 g/dL — AB (ref 13.0–17.0)
MCH: 29.5 pg (ref 26.0–34.0)
MCHC: 31.5 g/dL (ref 30.0–36.0)
MCV: 93.5 fL (ref 78.0–100.0)
Platelets: 242 10*3/uL (ref 150–400)
RBC: 3.56 MIL/uL — ABNORMAL LOW (ref 4.22–5.81)
RDW: 16.6 % — ABNORMAL HIGH (ref 11.5–15.5)
WBC: 4.9 10*3/uL (ref 4.0–10.5)

## 2014-11-10 LAB — APTT: aPTT: 30 seconds (ref 24–37)

## 2014-11-10 LAB — PROTIME-INR
INR: 1.16 (ref 0.00–1.49)
Prothrombin Time: 15 seconds (ref 11.6–15.2)

## 2014-11-10 LAB — BONE MARROW EXAM

## 2014-11-10 MED ORDER — MIDAZOLAM HCL 2 MG/2ML IJ SOLN
INTRAMUSCULAR | Status: AC
  Filled 2014-11-10: qty 6

## 2014-11-10 MED ORDER — MIDAZOLAM HCL 2 MG/2ML IJ SOLN
INTRAMUSCULAR | Status: AC | PRN
  Administered 2014-11-10 (×3): 0.5 mg via INTRAVENOUS

## 2014-11-10 MED ORDER — FENTANYL CITRATE (PF) 100 MCG/2ML IJ SOLN
INTRAMUSCULAR | Status: AC
  Filled 2014-11-10: qty 4

## 2014-11-10 MED ORDER — FENTANYL CITRATE (PF) 100 MCG/2ML IJ SOLN
INTRAMUSCULAR | Status: AC | PRN
  Administered 2014-11-10: 25 ug via INTRAVENOUS

## 2014-11-10 MED ORDER — SODIUM CHLORIDE 0.9 % IV SOLN
INTRAVENOUS | Status: DC
  Administered 2014-11-10: 10:00:00 via INTRAVENOUS

## 2014-11-10 NOTE — Procedures (Signed)
Interventional Radiology Procedure Note  Procedure: CT guided aspirate and core biopsy of right iliac bone Complications: None Recommendations: - Bedrest supine x 2 hrs - Hydrocodone PRN  Pain - Follow biopsy results  Signed,  Gerald Honea K. Rigel Filsinger, MD   

## 2014-11-10 NOTE — Discharge Instructions (Signed)
Conscious Sedation Sedation is the use of medicines to promote relaxation and relieve discomfort and anxiety. Conscious sedation is a type of sedation. Under conscious sedation you are less alert than normal but are still able to respond to instructions or stimulation. Conscious sedation is used during short medical and dental procedures. It is milder than deep sedation or general anesthesia and allows you to return to your regular activities sooner.  LET [facility] CARE PROVIDER KNOW ABOUT:   Any allergies you have.  All medicines you are taking, including vitamins, herbs, eye drops, creams, and over-the-counter medicines.  Use of steroids (by mouth or creams).  Previous problems you or members of your family have had with the use of anesthetics.  Any blood disorders you have.  Previous surgeries you have had.  Medical conditions you have.  Possibility of pregnancy, if this applies.  Use of cigarettes, alcohol, or illegal drugs. RISKS AND COMPLICATIONS Generally, this is a safe procedure. However, as with any procedure, problems can occur. Possible problems include:  Oversedation.  Trouble breathing on your own. You may need to have a breathing tube until you are awake and breathing on your own.  Allergic reaction to any of the medicines used for the procedure. BEFORE THE PROCEDURE  You may have blood tests done. These tests can help show how well your kidneys and liver are working. They can also show how well your blood clots.  A physical exam will be done.  Only take medicines as directed by your health care provider. You may need to stop taking medicines (such as blood thinners, aspirin, or nonsteroidal anti-inflammatory drugs) before the procedure.   Do not eat or drink at least 6 hours before the procedure or as directed by your health care provider.  Arrange for a responsible adult, family member, or friend to take you home after the procedure. He or she should stay  with you for at least 24 hours after the procedure, until the medicine has worn off. PROCEDURE   An intravenous (IV) catheter will be inserted into one of your veins. Medicine will be able to flow directly into your body through this catheter. You may be given medicine through this tube to help prevent pain and help you relax.  The medical or dental procedure will be done. AFTER THE PROCEDURE  You will stay in a recovery area until the medicine has worn off. Your blood pressure and pulse will be checked.   Depending on the procedure you had, you may be allowed to go home when you can tolerate liquids and your pain is under control. Document Released: 02/13/2001 Document Revised: 05/26/2013 Document Reviewed: 01/26/2013 [facility] Patient Information 2015 [place], [place]. This information is not intended to replace advice given to you by your health care provider. Make sure you discuss any questions you have with your health care provider. Bone Marrow Aspiration, Bone Marrow Biopsy Care After Read the instructions outlined below and refer to this sheet in the next few weeks. These discharge instructions provide you with general information on caring for yourself after you leave the hospital. Your caregiver may also give you specific instructions. While your treatment has been planned according to the most current medical practices available, unavoidable complications occasionally occur. If you have any problems or questions after discharge, call your caregiver. FINDING OUT THE RESULTS OF YOUR TEST Not all test results are available during your visit. If your test results are not back during the visit, make an appointment with your  caregiver to find out the results. Do not assume everything is normal if you have not heard from your caregiver or the medical facility. It is important for you to follow up on all of your test results.  HOME CARE INSTRUCTIONS  You have had sedation and may be sleepy or  dizzy. Your thinking may not be as clear as usual. For the next 24 hours:  Only take over-the-counter or prescription medicines for pain, discomfort, and or fever as directed by your caregiver.  Do not drink alcohol.  Do not smoke.  Do not drive.  Do not make important legal decisions.  Do not operate heavy machinery.  Do not care for small children by yourself.  Keep your dressing clean and dry. You may replace dressing with a bandage after 24 hours.  You may take a bath or shower after 24 hours.  Use an ice pack for 20 minutes every 2 hours while awake for pain as needed. SEEK MEDICAL CARE IF:   There is redness, swelling, or increasing pain at the biopsy site.  There is pus coming from the biopsy site.  There is drainage from a biopsy site lasting longer than one day.  An unexplained oral temperature above 102 F (38.9 C) develops. SEEK IMMEDIATE MEDICAL CARE IF:   You develop a rash.  You have difficulty breathing.  You develop any reaction or side effects to medications given. Document Released: 12/08/2004 Document Revised: 08/13/2011 Document Reviewed: 05/18/2008 [facility] Patient Information 2015 [place], [place]. This information is not intended to replace advice given to you by your health care provider. Make sure you discuss any questions you have with your health care provider.  Conscious Sedation, Adult, Care After Refer to this sheet in the next few weeks. These instructions provide you with information on caring for yourself after your procedure. Your health care provider may also give you more specific instructions. Your treatment has been planned according to current medical practices, but problems sometimes occur. Call your health care provider if you have any problems or questions after your procedure. WHAT TO EXPECT AFTER THE PROCEDURE  After your procedure:  You may feel sleepy, clumsy, and have poor balance for several hours.  Vomiting may occur if  you eat too soon after the procedure. HOME CARE INSTRUCTIONS  Do not participate in any activities where you could become injured for at least 24 hours. Do not:  Drive.  Swim.  Ride a bicycle.  Operate heavy machinery.  Cook.  Use power tools.  Climb ladders.  Work from a high place.  Do not make important decisions or sign legal documents until you are improved.  If you vomit, drink water, juice, or soup when you can drink without vomiting. Make sure you have little or no nausea before eating solid foods.  Only take over-the-counter or prescription medicines for pain, discomfort, or fever as directed by your health care provider.  Make sure you and your family fully understand everything about the medicines given to you, including what side effects may occur.  You should not drink alcohol, take sleeping pills, or take medicines that cause drowsiness for at least 24 hours.  If you smoke, do not smoke without supervision.  If you are feeling better, you may resume normal activities 24 hours after you were sedated.  Keep all appointments with your health care provider. SEEK MEDICAL CARE IF:  Your skin is pale or bluish in color.  You continue to feel nauseous or vomit.  Your  pain is getting worse and is not helped by medicine.  You have bleeding or swelling.  You are still sleepy or feeling clumsy after 24 hours. SEEK IMMEDIATE MEDICAL CARE IF:  You develop a rash.  You have difficulty breathing.  You develop any type of allergic problem.  You have a fever. MAKE SURE YOU:  Understand these instructions.  Will watch your condition.  Will get help right away if you are not doing well or get worse. Document Released: 03/11/2013 Document Reviewed: 03/11/2013 [facility] Patient Information 2015 [place], [place]. This information is not intended to replace advice given to you by your health care provider. Make sure you discuss any questions you have with your  health care provider.  May remove dressing and bathe or shower in 24 hours. Keep site clean and dry and report signs of infection.

## 2014-11-10 NOTE — H&P (Signed)
Chief Complaint: "I'm here for a bone marrow test"  Referring Physician(s): Sherrill,Gary B  History of Present Illness: [name] is a 81 y.o. male with history of essential thrombocytosis and now with transfusion-dependent anemia who presents today for CT-guided bone marrow biopsy to rule out myelofibrosis or myelodysplasia.  Past Medical History  Diagnosis Date  . Chronic kidney disease   . Thrombocythemia   . Esophageal stricture   . Anemia   . Anemia due to chronic renal failure treated with erythropoietin 03/24/2011  . Retinal vein thrombosis, left   . Coronary artery disease   . Hypertension   . GERD (gastroesophageal reflux disease)   . Arthritis   . Anginal pain   . Erosive esophagitis     grade 4  . Diverticulosis   . Internal hemorrhoids   . HOH (hard of hearing)     Past Surgical History  Procedure Laterality Date  . Replacement total knee Right 04/19/08  . Av fistula placement Left 12/01/2013    Procedure: RADIOCEPHALIC ARTERIOVENOUS (AV) FISTULA CREATION;  Surgeon: Sherren Kerns, MD;  Location: [facility] OR;  Service: Vascular;  Laterality: Left;  . Coronary artery bypass graft  2008    x 5  . Eye surgery Left yrs ago    vitrectomy  . Eye brow lift Right     01-11-2014  . Inguinal hernia repair Left 05/08/01  . Colonoscopy N/A 02/02/2014    Procedure: COLONOSCOPY;  Surgeon: Louis Meckel, MD;  Location: [facility] END[facility]COPY;  Service: Endoscopy;  Laterality: N/A;  . Esophagogastroduodenoscopy N/A 02/02/2014    Procedure: ESOPHAGOGASTRODUODENOSCOPY (EGD);  Surgeon: Louis Meckel, MD;  Location: [facility];  Service: Endoscopy;  Laterality: N/A;  . Ligation of arteriovenous  fistula Left 07/06/2014    Procedure: LIGATION OF ARTERIOVENOUS  FISTULA;  Surgeon: Sherren Kerns, MD;  Location: [facility] OR;  Service: Vascular;  Laterality: Left;    Allergies: Isosorbide and Norvasc  Medications: Prior to Admission medications   Medication Sig Start Date End Date  Taking? Authorizing Provider  acetaminophen-codeine (TYLENOL #2) 300-15 MG per tablet Take 1 tablet by mouth every 4 (four) hours as needed for moderate pain. 10/27/14  Yes Ladene Artist, MD  acyclovir (ZOVIRAX) 200 MG capsule Take 200 mg by mouth daily.  09/28/14  Yes Historical Provider, MD  anagrelide (AGRYLIN) 1 MG capsule TAKE ONE CAPSULE BY MOUTH ONCE DAILY OR AS DIRECTED BY PHYSICIAN. 10/05/14  Yes Malachy Mood, MD  aspirin EC 81 MG tablet Take 81 mg by mouth daily.   Yes Historical Provider, MD  cloNIDine (CATAPRES) 0.3 MG tablet Take 0.6 mg by mouth 3 (three) times daily. 09/28/14  Yes Historical Provider, MD  ferrous sulfate 325 (65 FE) MG tablet Take 1 tablet (325 mg total) by mouth 3 (three) times daily with meals. 11/02/14  Yes Louis Meckel, MD  furosemide (LASIX) 40 MG tablet Take 40 mg by mouth 2 (two) times daily.    Yes Historical Provider, MD  hydrALAZINE (APRESOLINE) 25 MG tablet take 1 tablet by mouth three times a day 02/15/14  Yes Laurey Morale, MD  metoprolol succinate (TOPROL-XL) 50 MG 24 hr tablet Take 1 tablet (50 mg total) by mouth daily. Take with or immediately following a meal. 10/21/14  Yes Rodolph Bong, MD  Multiple Vitamins-Minerals (MULTIVITAMIN ADULT) TABS Take 1 tablet by mouth daily.   Yes Historical Provider, MD  pantoprazole (PROTONIX) 40 MG tablet Take 1 tablet (40 mg total) by  mouth 2 (two) times daily. 07/21/14  Yes Inda Castle, MD  Tamsulosin HCl (FLOMAX) 0.4 MG CAPS Take 0.4 mg by mouth daily after supper.    Yes Historical Provider, MD  cephALEXin (KEFLEX) 250 MG capsule Take 1 capsule (250 mg total) by mouth every 8 (eight) hours. Take for 5 days then stop. Patient not taking: Reported on 11/10/2014 10/21/14   Eugenie Filler, MD    Family History  Problem Relation Age of Onset  . Dementia Mother   . Angina Father   . Heart attack Neg Hx     History   Social History  . Marital Status: Married    Spouse Name: [name]  . Number of Children: 2   . Years of Education: 12   Occupational History  .      retired   Social History Main Topics  . Smoking status: Former Smoker    Types: Cigarettes    Quit date: 11/27/1978  . Smokeless tobacco: Former Systems developer    Types: [place] date: 11/27/1978     Comment: quit 30 +yrs ago  . Alcohol Use: No     Comment: Quit 30 + yrs ago  . Drug Use: No  . Sexual Activity: Not on file   Other Topics Concern  . None   Social History Narrative   Patient lives at home with wife [name]) and son.   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  HENT: Positive for hearing loss.   Respiratory: Negative for cough and shortness of breath.   Cardiovascular: Negative for chest pain.  Gastrointestinal: Negative for nausea, vomiting, abdominal pain and blood in stool.  Genitourinary: Negative for dysuria and hematuria.  Musculoskeletal: Negative for back pain.  Neurological: Negative for headaches.    Vital Signs: BP 156/67 mmHg  Pulse 52  Temp(Src) 97.9 F (36.6 C) (Oral)  Resp 18  SpO2 96%  Physical Exam  Constitutional: He is oriented to person, place, and time.  Thin elderly white male in no acute distress.  Cardiovascular: Regular rhythm.   Slight bradycardic  Pulmonary/Chest: Effort normal.  Distant breath sounds bilaterally.  Abdominal: Soft. Bowel sounds are normal. There is no tenderness.  Musculoskeletal: He exhibits no edema.  Neurological: He is alert and oriented to person, place, and time.    Imaging: X-ray Chest Pa And Lateral  10/17/2014   CLINICAL DATA:  Syncope.  Fall.  Initial encounter.  EXAM: CHEST  2 VIEW  COMPARISON:  01/14/2013 and prior chest radiographs  FINDINGS: Cardiomegaly and CABG changes noted.  Mild pulmonary vascular congestion is identified.  Bilateral lower lobe opacities noted -question atelectasis versus airspace disease.  There  is no evidence of pulmonary edema, suspicious pulmonary nodule/mass, pleural effusion, or pneumothorax. No acute bony abnormalities are identified.  IMPRESSION: Bilateral lower lobe opacities -question atelectasis versus airspace disease/pneumonia.  Cardiomegaly and mild pulmonary vascular congestion.   Electronically Signed   By: Margarette Canada M.D.   On: 10/17/2014 16:32   Ct Head Wo Contrast  10/17/2014   CLINICAL DATA:  Status post fall on the bathroom with laceration to the right parietal and occipital region. Patient has a history of prostate cancer.  EXAM: CT HEAD WITHOUT CONTRAST  CT CERVICAL SPINE WITHOUT CONTRAST  TECHNIQUE: Multidetector CT imaging of the head and cervical spine was  performed following the standard protocol without intravenous contrast. Multiplanar CT image reconstructions of the cervical spine were also generated.  COMPARISON:  MR brain August 26, 2013  FINDINGS: CT HEAD FINDINGS  There is chronic diffuse atrophy. There is no midline shift, hydrocephalus, or mass. No acute hemorrhage or acute transcortical infarct is identified. The bony calvarium is intact. There is right frontal parietal scalp swelling and hematoma. Complete opacity of bilateral maxillary sinuses are identified.  CT CERVICAL SPINE FINDINGS  There is no acute fracture or dislocation. Degenerative joint changes of the spine are identified. The prevertebral soft tissues are normal. There is a 1.4 x 0.7 cm lytic lesion in the anterior left C1.  IMPRESSION: Right frontal/ parietal scalp swelling and hematoma. No intracranial hemorrhage is identified.  No acute fracture or dislocation of cervical spine.   Electronically Signed   By: Abelardo Diesel M.D.   On: 10/17/2014 10:19   Ct Cervical Spine Wo Contrast  10/17/2014   CLINICAL DATA:  Status post fall on the bathroom with laceration to the right parietal and occipital region. Patient has a history of prostate cancer.  EXAM: CT HEAD WITHOUT CONTRAST  CT CERVICAL SPINE  WITHOUT CONTRAST  TECHNIQUE: Multidetector CT imaging of the head and cervical spine was performed following the standard protocol without intravenous contrast. Multiplanar CT image reconstructions of the cervical spine were also generated.  COMPARISON:  MR brain August 26, 2013  FINDINGS: CT HEAD FINDINGS  There is chronic diffuse atrophy. There is no midline shift, hydrocephalus, or mass. No acute hemorrhage or acute transcortical infarct is identified. The bony calvarium is intact. There is right frontal parietal scalp swelling and hematoma. Complete opacity of bilateral maxillary sinuses are identified.  CT CERVICAL SPINE FINDINGS  There is no acute fracture or dislocation. Degenerative joint changes of the spine are identified. The prevertebral soft tissues are normal. There is a 1.4 x 0.7 cm lytic lesion in the anterior left C1.  IMPRESSION: Right frontal/ parietal scalp swelling and hematoma. No intracranial hemorrhage is identified.  No acute fracture or dislocation of cervical spine.   Electronically Signed   By: Abelardo Diesel M.D.   On: 10/17/2014 10:19   Mr Brain Wo Contrast  10/20/2014   CLINICAL DATA:  Ataxia.  Syncope and scalp laceration.  EXAM: MRI HEAD WITHOUT CONTRAST  TECHNIQUE: Multiplanar, multiecho pulse sequences of the brain and surrounding structures were obtained without intravenous contrast.  COMPARISON:  CT head 10/17/2014, MRI 08/26/2013  FINDINGS: Moderate atrophy.  Negative for hydrocephalus.  Negative for acute infarct. Mild chronic microvascular ischemic change in the white matter. Small right hemispheric subdural hygroma consistent with CSF. No subdural hemorrhage identified.  Negative for mass or edema.  No shift of the midline structures.  Chronic sinusitis with mucosal edema throughout the paranasal sinuses.  Right sided scalp soft tissue swelling shows interval improvement.  IMPRESSION: Small subdural hygroma on the right. No evidence of subdural hemorrhage  Generalized  atrophy. Chronic microvascular ischemic change in the white matter.  Negative for acute infarct  Chronic sinusitis.   Electronically Signed   By: Franchot Gallo M.D.   On: 10/20/2014 11:59   Dg Hip Port Unilat With Pelvis 1v Right  10/17/2014   CLINICAL DATA:  81 year old male with fall on right hip this morning with right hip pain. Initial encounter.  EXAM: RIGHT HIP (WITH PELVIS) 1 VIEW PORTABLE  COMPARISON:  None.  FINDINGS: There is no evidence of hip fracture or dislocation. There is no  evidence of arthropathy or other focal bone abnormality.  IMPRESSION: Negative.   Electronically Signed   By: Margarette Canada M.D.   On: 10/17/2014 10:34    Labs:  CBC:  Recent Labs  10/20/14 0322 10/21/14 0513 10/27/14 1241 11/10/14 0912  WBC 4.6 4.0 4.0 4.9  HGB 8.7* 8.1* 8.2* 10.5*  HCT 26.8* 25.2* 25.8* 33.3*  PLT 227 196 201 242    COAGS:  Recent Labs  10/17/14 0850 10/19/14 1203  INR 1.23 1.26  APTT  --  30    BMP:  Recent Labs  10/17/14 0850 10/17/14 0914 10/19/14 0710 10/20/14 0322 10/21/14 0513  NA 137 137 139 140 139  K 5.1 5.1 4.4 4.5 4.7  CL 106 107 111 113* 111  CO2 19*  --  18* 19* 20*  GLUCOSE 135* 133* 104* 106* 100*  BUN 98* 97* 82* 68* 53*  CALCIUM 8.7*  --  8.0* 8.3* 8.4*  CREATININE 2.91* 2.80* 2.57* 2.26* 2.07*  GFRNONAA 19*  --  22* 26* 29*  GFRAA 22*  --  26* 30* 33*    LIVER FUNCTION TESTS:  Recent Labs  03/08/14 1257 09/27/14 1535 10/17/14 0850 10/19/14 1203  BILITOT 0.31 0.24 0.6 0.7  AST $Re'13 11 28 'oRI$ 14*  ALT 14 12 15* 16*  ALKPHOS 77 82 68 60  PROT 5.6* 5.6* 5.8* 4.7*  ALBUMIN 2.5* 3.0* 3.2* 2.8*    TUMOR MARKERS: No results for input(s): AFPTM, CEA, CA199, CHROMGRNA in the last 8760 hours.  Assessment and Plan: [name] is a 81 y.o. male with history of essential thrombocytosis and now with transfusion-dependent anemia who presents today for CT-guided bone marrow biopsy to rule out myelofibrosis or myelodysplasia.Risks and benefits  discussed with the patient including, but not limited to bleeding, infection, damage to adjacent structures or low yield requiring additional tests. All of the patient's questions were answered, patient is agreeable to proceed. Consent signed and in chart.       Signed: D. Rowe Robert 11/10/2014, 10:18 AM   I spent a total of 20 minutes face to face in clinical consultation, greater than 50% of which was counseling/coordinating care for CT-guided bone marrow biopsy

## 2014-11-11 ENCOUNTER — Other Ambulatory Visit: Payer: Self-pay | Admitting: *Deleted

## 2014-11-11 DIAGNOSIS — D473 Essential (hemorrhagic) thrombocythemia: Secondary | ICD-10-CM

## 2014-11-11 MED ORDER — ANAGRELIDE HCL 1 MG PO CAPS: ORAL_CAPSULE | ORAL | Status: DC

## 2014-11-15 ENCOUNTER — Ambulatory Visit (HOSPITAL_BASED_OUTPATIENT_CLINIC_OR_DEPARTMENT_OTHER): Payer: Medicare Other

## 2014-11-15 ENCOUNTER — Other Ambulatory Visit (HOSPITAL_BASED_OUTPATIENT_CLINIC_OR_DEPARTMENT_OTHER): Payer: Medicare Other

## 2014-11-15 VITALS — BP 124/51 | HR 59 | Temp 97.6°F

## 2014-11-15 DIAGNOSIS — D631 Anemia in chronic kidney disease: Secondary | ICD-10-CM | POA: Diagnosis not present

## 2014-11-15 DIAGNOSIS — N189 Chronic kidney disease, unspecified: Principal | ICD-10-CM

## 2014-11-15 DIAGNOSIS — N184 Chronic kidney disease, stage 4 (severe): Secondary | ICD-10-CM

## 2014-11-15 LAB — CBC WITH DIFFERENTIAL/PLATELET
BASO%: 1.7 % (ref 0.0–2.0)
BASOS ABS: 0.1 10*3/uL (ref 0.0–0.1)
EOS%: 5.4 % (ref 0.0–7.0)
Eosinophils Absolute: 0.3 10*3/uL (ref 0.0–0.5)
HEMATOCRIT: 30 % — AB (ref 38.4–49.9)
HEMOGLOBIN: 9.7 g/dL — AB (ref 13.0–17.1)
LYMPH%: 14.8 % (ref 14.0–49.0)
MCH: 29.8 pg (ref 27.2–33.4)
MCHC: 32.1 g/dL (ref 32.0–36.0)
MCV: 92.8 fL (ref 79.3–98.0)
MONO#: 0.4 10*3/uL (ref 0.1–0.9)
MONO%: 9 % (ref 0.0–14.0)
NEUT%: 69.1 % (ref 39.0–75.0)
NEUTROS ABS: 3.3 10*3/uL (ref 1.5–6.5)
PLATELETS: 267 10*3/uL (ref 140–400)
RBC: 3.23 10*6/uL — ABNORMAL LOW (ref 4.20–5.82)
RDW: 17.1 % — ABNORMAL HIGH (ref 11.0–14.6)
WBC: 4.8 10*3/uL (ref 4.0–10.3)
lymph#: 0.7 10*3/uL — ABNORMAL LOW (ref 0.9–3.3)

## 2014-11-15 LAB — HOLD TUBE, BLOOD BANK

## 2014-11-15 MED ORDER — DARBEPOETIN ALFA 300 MCG/0.6ML IJ SOSY
300.0000 ug | PREFILLED_SYRINGE | Freq: Once | INTRAMUSCULAR | Status: AC
  Administered 2014-11-15: 300 ug via SUBCUTANEOUS
  Filled 2014-11-15: qty 0.6

## 2014-11-16 LAB — IRON AND TIBC CHCC
%SAT: 24 % (ref 20–55)
Iron: 50 ug/dL (ref 42–163)
TIBC: 213 ug/dL (ref 202–409)
UIBC: 162 ug/dL (ref 117–376)

## 2014-11-16 LAB — FERRITIN CHCC: Ferritin: 174 ng/ml (ref 22–316)

## 2014-11-19 LAB — CHROMOSOME ANALYSIS, BONE MARROW

## 2014-11-29 ENCOUNTER — Other Ambulatory Visit: Payer: Self-pay | Admitting: Oncology

## 2014-11-29 ENCOUNTER — Ambulatory Visit: Payer: Medicare Other

## 2014-11-29 ENCOUNTER — Other Ambulatory Visit (HOSPITAL_BASED_OUTPATIENT_CLINIC_OR_DEPARTMENT_OTHER): Payer: Medicare Other

## 2014-11-29 ENCOUNTER — Ambulatory Visit (HOSPITAL_BASED_OUTPATIENT_CLINIC_OR_DEPARTMENT_OTHER): Payer: Medicare Other | Admitting: Oncology

## 2014-11-29 ENCOUNTER — Telehealth: Payer: Self-pay | Admitting: Oncology

## 2014-11-29 VITALS — BP 121/55 | HR 62 | Temp 97.2°F | Resp 18 | Ht 71.0 in | Wt 154.1 lb

## 2014-11-29 DIAGNOSIS — D631 Anemia in chronic kidney disease: Secondary | ICD-10-CM

## 2014-11-29 DIAGNOSIS — N189 Chronic kidney disease, unspecified: Secondary | ICD-10-CM

## 2014-11-29 DIAGNOSIS — D473 Essential (hemorrhagic) thrombocythemia: Secondary | ICD-10-CM

## 2014-11-29 DIAGNOSIS — N183 Chronic kidney disease, stage 3 unspecified: Secondary | ICD-10-CM

## 2014-11-29 DIAGNOSIS — N184 Chronic kidney disease, stage 4 (severe): Secondary | ICD-10-CM

## 2014-11-29 DIAGNOSIS — D649 Anemia, unspecified: Secondary | ICD-10-CM

## 2014-11-29 LAB — CBC WITH DIFFERENTIAL/PLATELET
BASO%: 1.1 % (ref 0.0–2.0)
Basophils Absolute: 0 10*3/uL (ref 0.0–0.1)
EOS%: 5.3 % (ref 0.0–7.0)
Eosinophils Absolute: 0.2 10*3/uL (ref 0.0–0.5)
HCT: 29.9 % — ABNORMAL LOW (ref 38.4–49.9)
HGB: 9.8 g/dL — ABNORMAL LOW (ref 13.0–17.1)
LYMPH%: 16.9 % (ref 14.0–49.0)
MCH: 30.5 pg (ref 27.2–33.4)
MCHC: 32.7 g/dL (ref 32.0–36.0)
MCV: 93.5 fL (ref 79.3–98.0)
MONO#: 0.4 10*3/uL (ref 0.1–0.9)
MONO%: 9.4 % (ref 0.0–14.0)
NEUT#: 3.1 10*3/uL (ref 1.5–6.5)
NEUT%: 67.3 % (ref 39.0–75.0)
Platelets: 281 10*3/uL (ref 140–400)
RBC: 3.2 10*6/uL — AB (ref 4.20–5.82)
RDW: 17.9 % — ABNORMAL HIGH (ref 11.0–14.6)
WBC: 4.6 10*3/uL (ref 4.0–10.3)
lymph#: 0.8 10*3/uL — ABNORMAL LOW (ref 0.9–3.3)

## 2014-11-29 LAB — HOLD TUBE, BLOOD BANK

## 2014-11-29 MED ORDER — SODIUM CHLORIDE 0.9 % IJ SOLN
3.0000 mL | Freq: Once | INTRAMUSCULAR | Status: DC | PRN
  Filled 2014-11-29: qty 10

## 2014-11-29 MED ORDER — DARBEPOETIN ALFA 200 MCG/0.4ML IJ SOSY
200.0000 ug | PREFILLED_SYRINGE | Freq: Once | INTRAMUSCULAR | Status: DC
  Filled 2014-11-29: qty 0.4

## 2014-11-29 MED ORDER — DARBEPOETIN ALFA 300 MCG/0.6ML IJ SOSY
300.0000 ug | PREFILLED_SYRINGE | Freq: Once | INTRAMUSCULAR | Status: DC
  Administered 2014-11-29: 200 ug via SUBCUTANEOUS

## 2014-11-29 MED ORDER — SODIUM CHLORIDE 0.9 % IV SOLN: Freq: Once | INTRAVENOUS | Status: DC

## 2014-11-29 MED ORDER — SODIUM CHLORIDE 0.9 % IJ SOLN
10.0000 mL | INTRAMUSCULAR | Status: DC | PRN
  Filled 2014-11-29: qty 10

## 2014-11-29 MED ORDER — ALTEPLASE 2 MG IJ SOLR
2.0000 mg | Freq: Once | INTRAMUSCULAR | Status: DC | PRN
  Filled 2014-11-29: qty 2

## 2014-11-29 MED ORDER — HEPARIN SOD (PORK) LOCK FLUSH 100 UNIT/ML IV SOLN
250.0000 [IU] | Freq: Once | INTRAVENOUS | Status: DC | PRN
  Filled 2014-11-29: qty 5

## 2014-11-29 MED ORDER — HEPARIN SOD (PORK) LOCK FLUSH 100 UNIT/ML IV SOLN
500.0000 [IU] | Freq: Once | INTRAVENOUS | Status: DC | PRN
  Filled 2014-11-29: qty 5

## 2014-11-29 NOTE — Progress Notes (Signed)
  [facility] OFFICE PROGRESS NOTE   Diagnosis: Essential thrombocytosis  INTERVAL HISTORY:   [name] returns as scheduled. He continues every 2 week Aranesp. He is taking iron. He underwent a diagnostic bone marrow biopsy on 11/10/2014. This revealed a hypercellular marrow with atypical megakaryocytes. There is mild reticulin fibrosis. The cytogenetics returned with a 38 XY karyotype. No overt dyspoiesis. No increase in blasts. Storage iron is present.  Objective:  Vital signs in last 24 hours:  Blood pressure 121/55, pulse 62, temperature 97.2 F (36.2 C), temperature source Oral, resp. rate 18, height $RemoveBe'5\' 11"'gVuMBWWqy$  (1.803 m), weight 154 lb 1.6 oz (69.899 kg), SpO2 97 %.   Resp: Scattered end inspiratory rhonchi, no respiratory distress Cardio: Regular rate and rhythm GI: No hepatosplenomegaly Vascular: No leg edema   Lab Results:  Lab Results  Component Value Date   WBC 4.6 11/29/2014   HGB 9.8* 11/29/2014   HCT 29.9* 11/29/2014   MCV 93.5 11/29/2014   PLT 281 11/29/2014   NEUTROABS 3.1 11/29/2014     Medications: I have reviewed the patient's current medications.  Assessment/Plan: 1. Essential thrombocytosis, long-standing, maintained on anagrelide  Bone marrow biopsy 11/10/2014 with a hypercellular marrow, increased atypical megakaryocytes, 46 XY karyotype, iron present, no increase in blasts 2. Severe anemia-likely secondary to renal insufficiency, chronic GI bleeding, and the myeloproliferative disorder 3. Chronic renal failure 4. History of AVMs confirmed on a capsule endoscopy April 2016 5. Gastroesophageal reflux disease 6. Coronary artery disease 7. Movement disorder 8. History of iron deficiency-maintained on iron   Disposition:  The hemoglobin is stable today. He will continue iron and Aranesp. The bone marrow biopsy does not reveal evidence of a myelodysplastic syndrome. I reviewed the bone marrow findings with [name].  Betsy Coder, MD  11/29/2014  4:16 PM

## 2014-11-29 NOTE — Telephone Encounter (Signed)
Pt confirmed labs/ov/inj per 06/27 POF, gave pt AVS and Calendar... KJ

## 2014-11-30 ENCOUNTER — Encounter (HOSPITAL_COMMUNITY): Payer: Self-pay

## 2014-12-01 ENCOUNTER — Other Ambulatory Visit: Payer: Self-pay | Admitting: *Deleted

## 2014-12-01 ENCOUNTER — Telehealth: Payer: Self-pay | Admitting: Oncology

## 2014-12-01 MED ORDER — PANTOPRAZOLE SODIUM 40 MG PO TBEC: 40.0000 mg | DELAYED_RELEASE_TABLET | Freq: Two times a day (BID) | ORAL | Status: DC

## 2014-12-01 NOTE — Telephone Encounter (Signed)
Refill request sent from pharmacy

## 2014-12-01 NOTE — Telephone Encounter (Signed)
Faxed pt medical records to Dr. Ernie Hew

## 2014-12-04 ENCOUNTER — Other Ambulatory Visit: Payer: Self-pay | Admitting: Hematology

## 2014-12-07 ENCOUNTER — Other Ambulatory Visit: Payer: Self-pay | Admitting: *Deleted

## 2014-12-07 DIAGNOSIS — D473 Essential (hemorrhagic) thrombocythemia: Secondary | ICD-10-CM

## 2014-12-07 MED ORDER — ANAGRELIDE HCL 1 MG PO CAPS: ORAL_CAPSULE | ORAL | Status: DC

## 2014-12-13 ENCOUNTER — Other Ambulatory Visit (HOSPITAL_BASED_OUTPATIENT_CLINIC_OR_DEPARTMENT_OTHER): Payer: Medicare Other

## 2014-12-13 ENCOUNTER — Ambulatory Visit (HOSPITAL_BASED_OUTPATIENT_CLINIC_OR_DEPARTMENT_OTHER): Payer: Medicare Other

## 2014-12-13 VITALS — BP 128/57 | HR 59 | Temp 98.1°F

## 2014-12-13 DIAGNOSIS — D473 Essential (hemorrhagic) thrombocythemia: Secondary | ICD-10-CM

## 2014-12-13 DIAGNOSIS — D631 Anemia in chronic kidney disease: Secondary | ICD-10-CM

## 2014-12-13 DIAGNOSIS — N189 Chronic kidney disease, unspecified: Principal | ICD-10-CM

## 2014-12-13 DIAGNOSIS — N183 Chronic kidney disease, stage 3 (moderate): Secondary | ICD-10-CM | POA: Diagnosis present

## 2014-12-13 LAB — CBC WITH DIFFERENTIAL/PLATELET
BASO%: 1.3 % (ref 0.0–2.0)
Basophils Absolute: 0.1 10*3/uL (ref 0.0–0.1)
EOS%: 6.2 % (ref 0.0–7.0)
Eosinophils Absolute: 0.3 10*3/uL (ref 0.0–0.5)
HCT: 30.1 % — ABNORMAL LOW (ref 38.4–49.9)
HEMOGLOBIN: 9.7 g/dL — AB (ref 13.0–17.1)
LYMPH%: 17.3 % (ref 14.0–49.0)
MCH: 30.3 pg (ref 27.2–33.4)
MCHC: 32.2 g/dL (ref 32.0–36.0)
MCV: 94.1 fL (ref 79.3–98.0)
MONO#: 0.5 10*3/uL (ref 0.1–0.9)
MONO%: 11.8 % (ref 0.0–14.0)
NEUT#: 2.9 10*3/uL (ref 1.5–6.5)
NEUT%: 63.4 % (ref 39.0–75.0)
PLATELETS: 275 10*3/uL (ref 140–400)
RBC: 3.2 10*6/uL — AB (ref 4.20–5.82)
RDW: 17.3 % — AB (ref 11.0–14.6)
WBC: 4.5 10*3/uL (ref 4.0–10.3)
lymph#: 0.8 10*3/uL — ABNORMAL LOW (ref 0.9–3.3)

## 2014-12-13 LAB — HOLD TUBE, BLOOD BANK

## 2014-12-13 MED ORDER — DARBEPOETIN ALFA 200 MCG/0.4ML IJ SOSY
200.0000 ug | PREFILLED_SYRINGE | Freq: Once | INTRAMUSCULAR | Status: AC
  Administered 2014-12-13: 200 ug via SUBCUTANEOUS
  Filled 2014-12-13: qty 0.4

## 2014-12-17 ENCOUNTER — Ambulatory Visit (INDEPENDENT_AMBULATORY_CARE_PROVIDER_SITE_OTHER): Payer: Medicare Other | Admitting: Family Medicine

## 2014-12-17 VITALS — BP 102/60 | HR 62 | Temp 97.4°F | Resp 17 | Ht 69.0 in | Wt 158.0 lb

## 2014-12-17 DIAGNOSIS — G479 Sleep disorder, unspecified: Secondary | ICD-10-CM | POA: Diagnosis not present

## 2014-12-17 DIAGNOSIS — M19041 Primary osteoarthritis, right hand: Secondary | ICD-10-CM

## 2014-12-17 DIAGNOSIS — D500 Iron deficiency anemia secondary to blood loss (chronic): Secondary | ICD-10-CM | POA: Diagnosis not present

## 2014-12-17 DIAGNOSIS — R011 Cardiac murmur, unspecified: Secondary | ICD-10-CM | POA: Diagnosis not present

## 2014-12-17 DIAGNOSIS — F411 Generalized anxiety disorder: Secondary | ICD-10-CM

## 2014-12-17 DIAGNOSIS — R259 Unspecified abnormal involuntary movements: Secondary | ICD-10-CM | POA: Diagnosis not present

## 2014-12-17 DIAGNOSIS — I25810 Atherosclerosis of coronary artery bypass graft(s) without angina pectoris: Secondary | ICD-10-CM

## 2014-12-17 DIAGNOSIS — M19042 Primary osteoarthritis, left hand: Secondary | ICD-10-CM

## 2014-12-17 DIAGNOSIS — D62 Acute posthemorrhagic anemia: Secondary | ICD-10-CM

## 2014-12-17 DIAGNOSIS — R269 Unspecified abnormalities of gait and mobility: Secondary | ICD-10-CM

## 2014-12-17 DIAGNOSIS — I255 Ischemic cardiomyopathy: Secondary | ICD-10-CM | POA: Diagnosis not present

## 2014-12-17 DIAGNOSIS — N184 Chronic kidney disease, stage 4 (severe): Secondary | ICD-10-CM

## 2014-12-17 LAB — COMPLETE METABOLIC PANEL WITH GFR
ALBUMIN: 3.6 g/dL (ref 3.5–5.2)
ALT: 9 U/L (ref 0–53)
AST: 11 U/L (ref 0–37)
Alkaline Phosphatase: 61 U/L (ref 39–117)
BILIRUBIN TOTAL: 0.4 mg/dL (ref 0.2–1.2)
BUN: 81 mg/dL — ABNORMAL HIGH (ref 6–23)
CALCIUM: 8.4 mg/dL (ref 8.4–10.5)
CO2: 21 mEq/L (ref 19–32)
CREATININE: 2.46 mg/dL — AB (ref 0.50–1.35)
Chloride: 103 mEq/L (ref 96–112)
GFR, EST NON AFRICAN AMERICAN: 24 mL/min — AB
GFR, Est African American: 27 mL/min — ABNORMAL LOW
Glucose, Bld: 115 mg/dL — ABNORMAL HIGH (ref 70–99)
Potassium: 5 mEq/L (ref 3.5–5.3)
Sodium: 135 mEq/L (ref 135–145)
Total Protein: 5.8 g/dL — ABNORMAL LOW (ref 6.0–8.3)

## 2014-12-17 MED ORDER — ALPRAZOLAM 0.25 MG PO TABS: ORAL_TABLET | ORAL | Status: DC

## 2014-12-17 MED ORDER — ZOLPIDEM TARTRATE 5 MG PO TABS: ORAL_TABLET | ORAL | Status: DC

## 2014-12-17 MED ORDER — ACETAMINOPHEN-CODEINE #2 300-15 MG PO TABS: 1.0000 | ORAL_TABLET | ORAL | Status: DC | PRN

## 2014-12-17 NOTE — Progress Notes (Signed)
Subjective:  Patient ID: [name], male    DOB: 16-Apr-1932  Age: 81 y.o. MRN: [id number]  Patient is here for his first time. He had seen the name Linna Darner and thought he had seen me before, but apparently had seen Unice Cobble M.D. in the past somewhere a long time ago. He has multiple medical problems. He does not have a primary care doctor. He sees a oncologist, has seen a neurologist, cardiologist, dentist, and some others. Apparently when he goes other doctors have often given him pain medicine and sleep medicine or medicine. He is here for refills of some of his medications. He is on a long list of meds. He says he does not like to take meds on regular basis, and uses none of these regularly but only occasionally. He wants some Ambien. He takes 1 several times a month. For instance he had a stressful time with his disabled child yesterday and took 1 to help him sleep. He usually just takes a half of a 5 mg Ambien. He has some alprazolam (Xanax) 0.25 which he takes rarely also, may be a time or 2 a week but usually for less than. He has some Tylenol No. 2 which takes for arthritis pain in his hands. This does not take that very often. He has some old oxycodone for about 10 years ago which he has taken occasionally. He is chronically constipated but that is taken care of by taking a daily dose of MiraLAX mixed with Metamucil. He had a recent subdural hematoma which apparently has not been a problem to him he denies problems with his memory seems to be quite sharp. He has a gait disturbance and tremor and coordination difficulty that was seen a neurologist about, not sure exactly what caused that. He has a history of CABG many years ago, occasionally seen a cardiologist. He has a problem with his platelets and anemia and sees a rheumatologist.  Review of systems was fairly unremarkable. He is alert. His HEENT is normal. Cardiovascular no current problems. Has been worked up in the not too distant past.  He said when he was in the hospital with his head injury that they checked everything out in great detail for 4 days. His respiratory is unremarkable. GI no nausea vomiting or diarrhea. Has constipation. GU has some urinary flow difficulties which are not a major problem to him. Musculoskeletal as above. Endocrine is not diabetic. Psychiatric unremarkable.  A number of pages of his old chart were reviewed. He has many many visits listed in the electronic medical records which are available.   Objective:   Elderly gentleman, alert and oriented. Eyes PERRLA. Throat clear. No carotid bruit. His chest is clear with systolic murmur grade 5-1/0 loud second heart sound. Sternal scar is noted. Abdomen soft without mass or tenderness. Genitorectal exam not done. Extremities are without edema. He has some tremor.  Spent about 25-30 minutes trying to talk with him his medications and explained why elderly should not take too many of these medications. Counseled with him about his need for having a primary care doctor. Explained to him that we did not do chronic pain management, and that chronic narcotic pain medications were not the best thing for his arthritis probably. He actually has read up on things quite a lot.  Assessment & Plan:   Assessment: Anxiety Sleep disturbance Bilateral hand arthritis History of CABG Heart murmur Gait disturbance Anemia Polypharmacy  Plan:  I tried to summarize and go over  everything with him. I wrote it all down so he can remember and understand it. Patient Instructions  You need to find a primary care doctor who can help manage things that the specialist cannot or will not. I recommend that you see someone who specializes in the multiple problems that the elderly have. Recommend: [facility]   [phone], [place] [address], [place]. I am asking my staff to try and refer you there which might help you to get in.  Elderly individuals should minimize  pain medications and antianxiety medications and sleep medications as all can increase problems with confusion or falls or constipation. We prefer not prescribe chronic pain medications for the long-term. However I'm willing to give you a few things for short-term use to use only when you really need them.  Take one half of Ambien 5 mg only if needed for sleep after being unable to sleep well for a night or two.  Take the Tylenol with codeine No. 2 only when needed for bad hand arthritis pain. If your pain is milder try just taking over-the-counter Tylenol (acetaminophen) 1000 mg  (500 mg 2).  If you are having very bad pain you can take 2 of the Tylenol with codeine No. 2 but do this only on rare occasion( which is equivalent to taking Tylenol with codeine#3)  The maximum amount of Tylenol (acetaminophen) that you can take in 24 hours is 5 or 6 pills. That includes both the Tylenol ( acetaminophen)products and the Tylenol with codeine products.  I would advise someone your age avoiding the oxycodone  Take the Xanax 0.25 mg one half or one pill only when needed for bad anxiety or stressful situations.  Continue your other medications as prescribed by the specialist.  Make sure your arteries drinking lots of water.  We are happy to see you on an as-needed basis but cannot be regular prescriber's of pain medications and nerve medications.       Yuvonne Lanahan, MD 12/17/2014

## 2014-12-17 NOTE — Progress Notes (Deleted)
   Subjective:    Patient ID: [name], male    DOB: 1932/05/14, 81 y.o.   MRN: [id number]  HPI    Review of Systems     Objective:   Physical Exam        Assessment & Plan:

## 2014-12-17 NOTE — Patient Instructions (Addendum)
You need to find a primary care doctor who can help manage things that the specialist cannot or will not. I recommend that you see someone who specializes in the multiple problems that the elderly have. Recommend: [facility]   [phone], [place] [address] [place]. I am asking my staff to try and refer you there which might help you to get in.  Elderly individuals should minimize pain medications and antianxiety medications and sleep medications as all can increase problems with confusion or falls or constipation. We prefer not prescribe chronic pain medications for the long-term. However I'm willing to give you a few things for short-term use to use only when you really need them.  Take one half of Ambien 5 mg only if needed for sleep after being unable to sleep well for a night or two.  Take the Tylenol with codeine No. 2 only when needed for bad hand arthritis pain. If your pain is milder try just taking over-the-counter Tylenol (acetaminophen) 1000 mg  (500 mg 2).  If you are having very bad pain you can take 2 of the Tylenol with codeine No. 2 but do this only on rare occasion( which is equivalent to taking Tylenol with codeine#3)  The maximum amount of Tylenol (acetaminophen) that you can take in 24 hours is 5 or 6 pills. That includes both the Tylenol ( acetaminophen)products and the Tylenol with codeine products.  I would advise someone your age avoiding the oxycodone  Take the Xanax 0.25 mg one half or one pill only when needed for bad anxiety or stressful situations.  Continue your other medications as prescribed by the specialist.  Make sure your arteries drinking lots of water.  We are happy to see you on an as-needed basis but cannot be regular prescriber's of pain medications and nerve medications.

## 2014-12-18 ENCOUNTER — Encounter: Payer: Self-pay | Admitting: Family Medicine

## 2014-12-27 ENCOUNTER — Ambulatory Visit (HOSPITAL_BASED_OUTPATIENT_CLINIC_OR_DEPARTMENT_OTHER): Payer: Medicare Other

## 2014-12-27 ENCOUNTER — Other Ambulatory Visit (HOSPITAL_BASED_OUTPATIENT_CLINIC_OR_DEPARTMENT_OTHER): Payer: Medicare Other

## 2014-12-27 VITALS — BP 128/83 | HR 62 | Temp 98.5°F

## 2014-12-27 DIAGNOSIS — N183 Chronic kidney disease, stage 3 (moderate): Secondary | ICD-10-CM

## 2014-12-27 DIAGNOSIS — D631 Anemia in chronic kidney disease: Secondary | ICD-10-CM

## 2014-12-27 DIAGNOSIS — N189 Chronic kidney disease, unspecified: Secondary | ICD-10-CM

## 2014-12-27 DIAGNOSIS — D473 Essential (hemorrhagic) thrombocythemia: Secondary | ICD-10-CM

## 2014-12-27 LAB — CBC WITH DIFFERENTIAL/PLATELET
BASO%: 1 % (ref 0.0–2.0)
Basophils Absolute: 0.1 10*3/uL (ref 0.0–0.1)
EOS%: 4.5 % (ref 0.0–7.0)
Eosinophils Absolute: 0.3 10*3/uL (ref 0.0–0.5)
HCT: 31.5 % — ABNORMAL LOW (ref 38.4–49.9)
HEMOGLOBIN: 10.1 g/dL — AB (ref 13.0–17.1)
LYMPH#: 0.8 10*3/uL — AB (ref 0.9–3.3)
LYMPH%: 13.4 % — ABNORMAL LOW (ref 14.0–49.0)
MCH: 30.3 pg (ref 27.2–33.4)
MCHC: 32.2 g/dL (ref 32.0–36.0)
MCV: 94.2 fL (ref 79.3–98.0)
MONO#: 0.6 10*3/uL (ref 0.1–0.9)
MONO%: 10.7 % (ref 0.0–14.0)
NEUT#: 4 10*3/uL (ref 1.5–6.5)
NEUT%: 70.4 % (ref 39.0–75.0)
Platelets: 298 10*3/uL (ref 140–400)
RBC: 3.34 10*6/uL — AB (ref 4.20–5.82)
RDW: 16.8 % — ABNORMAL HIGH (ref 11.0–14.6)
WBC: 5.7 10*3/uL (ref 4.0–10.3)

## 2014-12-27 LAB — HOLD TUBE, BLOOD BANK

## 2014-12-27 MED ORDER — DARBEPOETIN ALFA 200 MCG/0.4ML IJ SOSY
200.0000 ug | PREFILLED_SYRINGE | Freq: Once | INTRAMUSCULAR | Status: AC
  Administered 2014-12-27: 200 ug via SUBCUTANEOUS
  Filled 2014-12-27: qty 0.4

## 2014-12-31 ENCOUNTER — Other Ambulatory Visit: Payer: Self-pay | Admitting: *Deleted

## 2014-12-31 DIAGNOSIS — D473 Essential (hemorrhagic) thrombocythemia: Secondary | ICD-10-CM

## 2014-12-31 MED ORDER — ANAGRELIDE HCL 1 MG PO CAPS: ORAL_CAPSULE | ORAL | Status: DC

## 2015-01-10 ENCOUNTER — Other Ambulatory Visit (HOSPITAL_BASED_OUTPATIENT_CLINIC_OR_DEPARTMENT_OTHER): Payer: Medicare Other

## 2015-01-10 ENCOUNTER — Telehealth: Payer: Self-pay | Admitting: Oncology

## 2015-01-10 ENCOUNTER — Ambulatory Visit (HOSPITAL_BASED_OUTPATIENT_CLINIC_OR_DEPARTMENT_OTHER): Payer: Medicare Other | Admitting: Nurse Practitioner

## 2015-01-10 ENCOUNTER — Telehealth: Payer: Self-pay | Admitting: Nurse Practitioner

## 2015-01-10 ENCOUNTER — Ambulatory Visit (HOSPITAL_BASED_OUTPATIENT_CLINIC_OR_DEPARTMENT_OTHER): Payer: Medicare Other

## 2015-01-10 VITALS — BP 146/60 | HR 63 | Temp 98.1°F | Resp 18 | Ht 69.0 in | Wt 156.9 lb

## 2015-01-10 DIAGNOSIS — D473 Essential (hemorrhagic) thrombocythemia: Secondary | ICD-10-CM

## 2015-01-10 DIAGNOSIS — D649 Anemia, unspecified: Secondary | ICD-10-CM

## 2015-01-10 DIAGNOSIS — K922 Gastrointestinal hemorrhage, unspecified: Secondary | ICD-10-CM

## 2015-01-10 DIAGNOSIS — D631 Anemia in chronic kidney disease: Secondary | ICD-10-CM

## 2015-01-10 DIAGNOSIS — K219 Gastro-esophageal reflux disease without esophagitis: Secondary | ICD-10-CM | POA: Diagnosis not present

## 2015-01-10 DIAGNOSIS — N189 Chronic kidney disease, unspecified: Secondary | ICD-10-CM

## 2015-01-10 DIAGNOSIS — D509 Iron deficiency anemia, unspecified: Secondary | ICD-10-CM | POA: Diagnosis not present

## 2015-01-10 DIAGNOSIS — N184 Chronic kidney disease, stage 4 (severe): Secondary | ICD-10-CM

## 2015-01-10 DIAGNOSIS — D47Z9 Other specified neoplasms of uncertain behavior of lymphoid, hematopoietic and related tissue: Secondary | ICD-10-CM

## 2015-01-10 LAB — CBC WITH DIFFERENTIAL/PLATELET
BASO%: 0.6 % (ref 0.0–2.0)
Basophils Absolute: 0 10*3/uL (ref 0.0–0.1)
EOS ABS: 0.2 10*3/uL (ref 0.0–0.5)
EOS%: 3.9 % (ref 0.0–7.0)
HCT: 31.2 % — ABNORMAL LOW (ref 38.4–49.9)
HGB: 10.1 g/dL — ABNORMAL LOW (ref 13.0–17.1)
LYMPH%: 17 % (ref 14.0–49.0)
MCH: 30.6 pg (ref 27.2–33.4)
MCHC: 32.4 g/dL (ref 32.0–36.0)
MCV: 94.5 fL (ref 79.3–98.0)
MONO#: 0.5 10*3/uL (ref 0.1–0.9)
MONO%: 9.7 % (ref 0.0–14.0)
NEUT#: 3.5 10*3/uL (ref 1.5–6.5)
NEUT%: 68.8 % (ref 39.0–75.0)
PLATELETS: 203 10*3/uL (ref 140–400)
RBC: 3.3 10*6/uL — AB (ref 4.20–5.82)
RDW: 15.6 % — ABNORMAL HIGH (ref 11.0–14.6)
WBC: 5.1 10*3/uL (ref 4.0–10.3)
lymph#: 0.9 10*3/uL (ref 0.9–3.3)

## 2015-01-10 LAB — HOLD TUBE, BLOOD BANK

## 2015-01-10 MED ORDER — DARBEPOETIN ALFA 200 MCG/0.4ML IJ SOSY
200.0000 ug | PREFILLED_SYRINGE | Freq: Once | INTRAMUSCULAR | Status: AC
  Administered 2015-01-10: 200 ug via SUBCUTANEOUS
  Filled 2015-01-10: qty 0.4

## 2015-01-10 NOTE — Progress Notes (Signed)
  [facility] OFFICE PROGRESS NOTE   Diagnosis:  Essential thrombocytosis, anemia  INTERVAL HISTORY:   [name] returns as scheduled. He continues every 2 week Aranesp. He continues anagrelide. He feels well. His energy level has improved. He has a good appetite. No shortness of breath. No fever. No bleeding.  Objective:  Vital signs in last 24 hours:  Blood pressure 146/60, pulse 63, temperature 98.1 F (36.7 C), temperature source Oral, resp. rate 18, height $RemoveBe'5\' 9"'QlXmIqMjw$  (1.753 m), weight 156 lb 14.4 oz (71.169 kg), SpO2 94 %.    HEENT: No thrush or ulcers. Resp: Lungs clear bilaterally. Cardio: Regular rate and rhythm. GI: Abdomen soft and nontender. No organomegaly. Vascular: No leg edema.  Lab Results:  Lab Results  Component Value Date   WBC 5.1 01/10/2015   HGB 10.1* 01/10/2015   HCT 31.2* 01/10/2015   MCV 94.5 01/10/2015   PLT 203 01/10/2015   NEUTROABS 3.5 01/10/2015    Imaging:  No results found.  Medications: I have reviewed the patient's current medications.  Assessment/Plan: 1. Essential thrombocytosis, long-standing, maintained on anagrelide  Bone marrow biopsy 11/10/2014 with a hypercellular marrow, increased atypical megakaryocytes, 46 XY karyotype, iron present, no increase in blasts 2. Severe anemia-likely secondary to renal insufficiency, chronic GI bleeding, and the myeloproliferative disorder 3. Chronic renal failure 4. History of AVMs confirmed on a capsule endoscopy April 2016 5. Gastroesophageal reflux disease 6. Coronary artery disease 7. Movement disorder 8. History of iron deficiency-maintained on iron   Disposition: [name] remains stable from a hematologic standpoint. The plan is to continue Aranesp every 2 weeks. He will continue anagrelide at the current dose. We scheduled a return visit in 8 weeks.    Ned Card ANP/GNP-BC   01/10/2015  3:30 PM

## 2015-01-10 NOTE — Telephone Encounter (Signed)
Received a call from [facility] in the lab about Pt.'s Hemoglobin an order was placed to hold a tube of blood Pending Pt.'s Hemoglobin. Pt.'s hemoglobin level was 10.1 discussed with Ned Card NP order discontinued.

## 2015-01-10 NOTE — Telephone Encounter (Signed)
Pt confirmed labs/ov/inj per 08/08 POF, gave pt avs and calendar.... KJ

## 2015-01-24 ENCOUNTER — Other Ambulatory Visit (HOSPITAL_BASED_OUTPATIENT_CLINIC_OR_DEPARTMENT_OTHER): Payer: Medicare Other

## 2015-01-24 ENCOUNTER — Ambulatory Visit (HOSPITAL_BASED_OUTPATIENT_CLINIC_OR_DEPARTMENT_OTHER): Payer: Medicare Other

## 2015-01-24 VITALS — BP 153/63 | HR 63 | Temp 98.3°F

## 2015-01-24 DIAGNOSIS — N189 Chronic kidney disease, unspecified: Secondary | ICD-10-CM

## 2015-01-24 DIAGNOSIS — D631 Anemia in chronic kidney disease: Secondary | ICD-10-CM

## 2015-01-24 DIAGNOSIS — D473 Essential (hemorrhagic) thrombocythemia: Secondary | ICD-10-CM

## 2015-01-24 DIAGNOSIS — N184 Chronic kidney disease, stage 4 (severe): Secondary | ICD-10-CM

## 2015-01-24 DIAGNOSIS — D649 Anemia, unspecified: Secondary | ICD-10-CM

## 2015-01-24 LAB — CBC WITH DIFFERENTIAL/PLATELET
BASO%: 1.2 % (ref 0.0–2.0)
Basophils Absolute: 0.1 10*3/uL (ref 0.0–0.1)
EOS%: 4.9 % (ref 0.0–7.0)
Eosinophils Absolute: 0.3 10*3/uL (ref 0.0–0.5)
HEMATOCRIT: 32.1 % — AB (ref 38.4–49.9)
HEMOGLOBIN: 10.3 g/dL — AB (ref 13.0–17.1)
LYMPH#: 0.8 10*3/uL — AB (ref 0.9–3.3)
LYMPH%: 15 % (ref 14.0–49.0)
MCH: 30.6 pg (ref 27.2–33.4)
MCHC: 32.1 g/dL (ref 32.0–36.0)
MCV: 95.3 fL (ref 79.3–98.0)
MONO#: 0.5 10*3/uL (ref 0.1–0.9)
MONO%: 9.3 % (ref 0.0–14.0)
NEUT%: 69.6 % (ref 39.0–75.0)
NEUTROS ABS: 3.5 10*3/uL (ref 1.5–6.5)
Platelets: 256 10*3/uL (ref 140–400)
RBC: 3.37 10*6/uL — ABNORMAL LOW (ref 4.20–5.82)
RDW: 15.9 % — ABNORMAL HIGH (ref 11.0–14.6)
WBC: 5.1 10*3/uL (ref 4.0–10.3)
nRBC: 0 % (ref 0–0)

## 2015-01-24 MED ORDER — DARBEPOETIN ALFA 200 MCG/0.4ML IJ SOSY
200.0000 ug | PREFILLED_SYRINGE | Freq: Once | INTRAMUSCULAR | Status: AC
  Administered 2015-01-24: 200 ug via SUBCUTANEOUS
  Filled 2015-01-24: qty 0.4

## 2015-01-28 ENCOUNTER — Other Ambulatory Visit: Payer: Self-pay | Admitting: *Deleted

## 2015-01-28 DIAGNOSIS — D473 Essential (hemorrhagic) thrombocythemia: Secondary | ICD-10-CM

## 2015-01-28 MED ORDER — ANAGRELIDE HCL 1 MG PO CAPS: ORAL_CAPSULE | ORAL | Status: DC

## 2015-02-01 ENCOUNTER — Other Ambulatory Visit: Payer: Self-pay | Admitting: Cardiology

## 2015-02-02 NOTE — Telephone Encounter (Signed)
He needs to schedule an appointment for before we can refill his metoprolol.

## 2015-02-03 ENCOUNTER — Encounter: Payer: Self-pay | Admitting: *Deleted

## 2015-02-04 ENCOUNTER — Other Ambulatory Visit: Payer: Self-pay | Admitting: Family Medicine

## 2015-02-08 ENCOUNTER — Ambulatory Visit (HOSPITAL_BASED_OUTPATIENT_CLINIC_OR_DEPARTMENT_OTHER): Payer: Medicare Other

## 2015-02-08 ENCOUNTER — Other Ambulatory Visit (HOSPITAL_BASED_OUTPATIENT_CLINIC_OR_DEPARTMENT_OTHER): Payer: Medicare Other

## 2015-02-08 VITALS — BP 137/92 | HR 56 | Temp 97.4°F

## 2015-02-08 DIAGNOSIS — D631 Anemia in chronic kidney disease: Secondary | ICD-10-CM | POA: Diagnosis not present

## 2015-02-08 DIAGNOSIS — D473 Essential (hemorrhagic) thrombocythemia: Secondary | ICD-10-CM

## 2015-02-08 DIAGNOSIS — D649 Anemia, unspecified: Secondary | ICD-10-CM

## 2015-02-08 DIAGNOSIS — N189 Chronic kidney disease, unspecified: Secondary | ICD-10-CM | POA: Diagnosis present

## 2015-02-08 DIAGNOSIS — N184 Chronic kidney disease, stage 4 (severe): Secondary | ICD-10-CM

## 2015-02-08 LAB — CBC WITH DIFFERENTIAL/PLATELET
BASO%: 2.2 % — AB (ref 0.0–2.0)
Basophils Absolute: 0.1 10*3/uL (ref 0.0–0.1)
EOS%: 4.5 % (ref 0.0–7.0)
Eosinophils Absolute: 0.2 10*3/uL (ref 0.0–0.5)
HEMATOCRIT: 30.2 % — AB (ref 38.4–49.9)
HEMOGLOBIN: 10 g/dL — AB (ref 13.0–17.1)
LYMPH#: 0.8 10*3/uL — AB (ref 0.9–3.3)
LYMPH%: 18.4 % (ref 14.0–49.0)
MCH: 31.1 pg (ref 27.2–33.4)
MCHC: 33.1 g/dL (ref 32.0–36.0)
MCV: 94 fL (ref 79.3–98.0)
MONO#: 0.5 10*3/uL (ref 0.1–0.9)
MONO%: 12 % (ref 0.0–14.0)
NEUT%: 62.9 % (ref 39.0–75.0)
NEUTROS ABS: 2.7 10*3/uL (ref 1.5–6.5)
PLATELETS: 222 10*3/uL (ref 140–400)
RBC: 3.21 10*6/uL — ABNORMAL LOW (ref 4.20–5.82)
RDW: 16 % — AB (ref 11.0–14.6)
WBC: 4.4 10*3/uL (ref 4.0–10.3)

## 2015-02-08 MED ORDER — DARBEPOETIN ALFA 200 MCG/0.4ML IJ SOSY
200.0000 ug | PREFILLED_SYRINGE | Freq: Once | INTRAMUSCULAR | Status: AC
  Administered 2015-02-08: 200 ug via SUBCUTANEOUS
  Filled 2015-02-08: qty 0.4

## 2015-02-09 ENCOUNTER — Encounter: Payer: Self-pay | Admitting: Internal Medicine

## 2015-02-09 ENCOUNTER — Ambulatory Visit (INDEPENDENT_AMBULATORY_CARE_PROVIDER_SITE_OTHER): Payer: Medicare Other | Admitting: Internal Medicine

## 2015-02-09 VITALS — BP 106/66 | HR 63 | Temp 97.5°F | Resp 20 | Ht 69.0 in | Wt 151.8 lb

## 2015-02-09 DIAGNOSIS — Z23 Encounter for immunization: Secondary | ICD-10-CM

## 2015-02-09 DIAGNOSIS — I255 Ischemic cardiomyopathy: Secondary | ICD-10-CM

## 2015-02-09 DIAGNOSIS — D631 Anemia in chronic kidney disease: Secondary | ICD-10-CM

## 2015-02-09 DIAGNOSIS — G894 Chronic pain syndrome: Secondary | ICD-10-CM

## 2015-02-09 DIAGNOSIS — K2100 Gastro-esophageal reflux disease with esophagitis, without bleeding: Secondary | ICD-10-CM

## 2015-02-09 DIAGNOSIS — H349 Unspecified retinal vascular occlusion: Secondary | ICD-10-CM

## 2015-02-09 DIAGNOSIS — H348122 Central retinal vein occlusion, left eye, stable: Secondary | ICD-10-CM

## 2015-02-09 DIAGNOSIS — F411 Generalized anxiety disorder: Secondary | ICD-10-CM | POA: Diagnosis not present

## 2015-02-09 DIAGNOSIS — N184 Chronic kidney disease, stage 4 (severe): Secondary | ICD-10-CM | POA: Diagnosis not present

## 2015-02-09 DIAGNOSIS — I10 Essential (primary) hypertension: Secondary | ICD-10-CM | POA: Diagnosis not present

## 2015-02-09 DIAGNOSIS — D473 Essential (hemorrhagic) thrombocythemia: Secondary | ICD-10-CM

## 2015-02-09 DIAGNOSIS — I2510 Atherosclerotic heart disease of native coronary artery without angina pectoris: Secondary | ICD-10-CM

## 2015-02-09 MED ORDER — ACETAMINOPHEN-CODEINE 300-15 MG PO TABS: ORAL_TABLET | ORAL | Status: AC

## 2015-02-09 MED ORDER — MELATONIN 3 MG PO TABS: ORAL_TABLET | ORAL | Status: AC

## 2015-02-09 MED ORDER — ACETAMINOPHEN-CODEINE 300-15 MG PO TABS: ORAL_TABLET | ORAL | Status: DC

## 2015-02-09 NOTE — Progress Notes (Signed)
Patient ID: [name], male   DOB: 04-07-1932, 81 y.o.   MRN: [id number]    Location:    [facility]   Place of Service:   OFFICE   Advanced Directive information Does patient have an advance directive?: Yes  Chief Complaint  Patient presents with  . Establish Care    New patient Establish care  . Medical Management of Chronic Issues    HPI:  81 yo male seen today as a new pt. He has chronic pain and takes tylenol #2 0.5 tab daily and prn. Most of pain in knees, hips and occasionally left hand pain. Uses heating pad prn. Pain is throbbing. He has AM stiffness that lasts about 1-2 hrs. Stiffness improves with movement. He sees ortho Dr [name] tomorrow for left hip pain. No falls but about 2 weeks ago, while performing knee bending exercises, left hip "popped out on me"  ET/AOCD- sees H/O. Takes agrylin. He has anemia with Hgb 10 on yesterday with plt count 222K.  HTN/CADwith new RBBB/ischemic heart disease - followed by cardio Dr [name]. He has an appt next month. BP fluctuates during the day and tends to be elevated by the end of the day. Last 2D echo (May 2016) revealed EF 55-60% with severly dilated LA and septal wall aneurysm.   CKD - sees nephrology Dr [name]. He was told he needs to start dialysis. Left forearm AVF attempted several mos ago but it failed and had to be reversed. Now has occasional left hand pain  Esophageal reflux - stable on PPI. He sees Dr [name]  He was taking Rx jalyn 0.5/0.4 daily for BPH and would like new Rx. He is not followed by urology  No new visual issues. He has hx left retinal vein occlusion that was surgically corrected. He sees Dr [name] for routine eye exams and a retinal specialist on a yearly basis   Past Medical History  Diagnosis Date  . Chronic kidney disease   . Thrombocythemia   . Esophageal stricture   . Anemia   . Anemia due to chronic renal failure treated with erythropoietin 03/24/2011  . Retinal vein thrombosis, left   .  Coronary artery disease   . Hypertension   . GERD (gastroesophageal reflux disease)   . Arthritis   . Anginal pain 1990  . Erosive esophagitis     grade 4  . Diverticulosis   . Internal hemorrhoids   . HOH (hard of hearing)   . Blood transfusion without reported diagnosis     Past Surgical History  Procedure Laterality Date  . Replacement total knee Right 04/19/08  . Av fistula placement Left 12/01/2013    Procedure: RADIOCEPHALIC ARTERIOVENOUS (AV) FISTULA CREATION;  Surgeon: Elam Dutch, MD;  Location: [facility];  Service: Vascular;  Laterality: Left;  . Coronary artery bypass graft  2008    x 5  . Eye surgery Left yrs ago    vitrectomy  . Eye brow lift Right     01-11-2014  . Inguinal hernia repair Left 05/08/01  . Colonoscopy N/A 02/02/2014    Procedure: COLONOSCOPY;  Surgeon: Inda Castle, MD;  Location: [facility];  Service: Endoscopy;  Laterality: N/A;  . Esophagogastroduodenoscopy N/A 02/02/2014    Procedure: ESOPHAGOGASTRODUODENOSCOPY (EGD);  Surgeon: Inda Castle, MD;  Location: [facility];  Service: Endoscopy;  Laterality: N/A;  . Ligation of arteriovenous  fistula Left 07/06/2014    Procedure: LIGATION OF ARTERIOVENOUS  FISTULA;  Surgeon: Elam Dutch, MD;  Location: [facility];  Service: Vascular;  Laterality: Left;  . Joint replacement    . Vasectomy      Patient Care Team: [name], MD as PCP - General (Internal Medicine) Eustace Moore, MD as Consulting Physician (Neurosurgery) Donato Heinz, MD as Consulting Physician (Nephrology)  Social History   Social History  . Marital Status: Married    Spouse Name: [name]  . Number of Children: 2  . Years of Education: 12   Occupational History  .      retired   Social History Main Topics  . Smoking status: Former Smoker -- 2.00 packs/day for 30 years    Types: Cigarettes    Quit date: 11/27/1978  . Smokeless tobacco: Former Systems developer    Types: [place] date: 11/27/1978     Comment: quit 30  +yrs ago  . Alcohol Use: No     Comment: Quit 30 + yrs ago  . Drug Use: No  . Sexual Activity: Not on file   Other Topics Concern  . Not on file   Social History Narrative   Diet:   Do you drink/eat things with caffeine? Yes   Marital status: Married                              What year were you married? 1953   Do you live in a house, apartment, assisted living, condo, trailer, etc)? House   Is it one or more stories? 1   How many persons live in your home? 3   Do you have any pets in your home? No   Current or past profession:Retired   Do you exercise?  Walking                                                   Type & how often:   Do you have a living will?Yes, has a trust.   Do you have a DNR Form?   Do you have a POA/HPOA forms? Yes                                                                                                  Patient lives at home with wife [name]) and son.   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 His smoking use included Cigarettes. He has a 60 pack-year smoking history. He quit smokeless tobacco use about 36 years ago. His smokeless tobacco use included Chew. He reports that he does not drink alcohol or use illicit drugs.  Family History  Problem Relation Age of Onset  . Dementia Mother   . Arthritis Mother   . Angina Father   . Heart attack Neg Hx    Family Status  Relation Status Death  Age  . Mother Deceased   . Father Deceased   . Brother Deceased   . Daughter Alive   . Son Alive     Immunization History  Administered Date(s) Administered  . Influenza,inj,Quad PF,36+ Mos 02/09/2014  . Influenza-Unspecified 03/04/2013    Allergies  Allergen Reactions  . Isosorbide Other (See Comments)    Caused a blood clot  . Norvasc [Amlodipine Besylate] Other (See Comments)    TIA's    Medications: Patient's Medications  New Prescriptions   No  medications on file  Previous Medications   ACETAMINOPHEN-CODEINE 300-15 MG TABS    Take one every 6-8 hours only if needed for moderate pain   ACYCLOVIR (ZOVIRAX) 200 MG CAPSULE    Take 200 mg by mouth daily.    ALPRAZOLAM (XANAX) 0.25 MG TABLET    Take one half to one pill only when needed for worse anxiety   ANAGRELIDE (AGRYLIN) 1 MG CAPSULE    TAKE ONE CAPSULE BY MOUTH ONCE DAILY OR AS DIRECTED BY PHYSICIAN.   ASPIRIN EC 81 MG TABLET    Take 81 mg by mouth daily.   CLONIDINE (CATAPRES) 0.3 MG TABLET    Take 0.6 mg by mouth daily.    FERROUS SULFATE 325 (65 FE) MG TABLET    Take 1 tablet (325 mg total) by mouth 3 (three) times daily with meals.   FUROSEMIDE (LASIX) 40 MG TABLET    Take 40 mg by mouth 2 (two) times daily.    HYDRALAZINE (APRESOLINE) 25 MG TABLET    take 1 tablet by mouth three times a day   METOPROLOL SUCCINATE (TOPROL-XL) 50 MG 24 HR TABLET    Take 1 tablet (50 mg total) by mouth daily. Take with or immediately following a meal.   MULTIPLE VITAMINS-MINERALS (MULTIVITAMIN ADULT) TABS    Take 1 tablet by mouth daily.   PANTOPRAZOLE (PROTONIX) 40 MG TABLET    Take 1 tablet (40 mg total) by mouth 2 (two) times daily.   TAMSULOSIN HCL (FLOMAX) 0.4 MG CAPS    Take 0.4 mg by mouth daily after supper.   Modified Medications   No medications on file  Discontinued Medications   No medications on file    Review of Systems  Constitutional: Negative for chills, activity change and fatigue.  HENT: Positive for dental problem (wears dentures), hearing loss (wears hearing aids) and sinus pressure. Negative for sore throat and trouble swallowing.        Has dry mouth  Eyes: Negative for visual disturbance.       Dry eyes, cats  Respiratory: Negative for cough, chest tightness and shortness of breath.   Cardiovascular: Negative for chest pain, palpitations and leg swelling.  Gastrointestinal: Negative for nausea, vomiting, abdominal pain and blood in stool.  Genitourinary: Positive  for dysuria and difficulty urinating (weak stream). Negative for urgency and frequency.  Musculoskeletal: Positive for joint swelling (and stiffness), arthralgias and gait problem.  Skin: Negative for rash.  Neurological: Negative for weakness and headaches.       Loss of balance  Hematological: Bruises/bleeds easily.  Psychiatric/Behavioral: Negative for confusion and sleep disturbance. The patient is not nervous/anxious.     Filed Vitals:   02/09/15 0852  BP: 106/66  Pulse: 63  Temp: 97.5 F (36.4 C)  TempSrc: Oral  Resp: 20  Height: 5\' 9"  (1.753 m)  Weight: 151 lb 12.8 oz (68.856 kg)  SpO2: 96%   Body mass index is 22.41 kg/(m^2).  Physical Exam  Constitutional: He is oriented to person, place, and time. He appears well-developed and well-nourished.  HENT:  Mouth/Throat: Oropharynx is clear and moist.  HOH  Eyes: Pupils are equal, round, and reactive to light. No scleral icterus.  Neck: Neck supple. Carotid bruit is not present. No thyromegaly present.  Cardiovascular: Normal rate, regular rhythm, normal heart sounds and intact distal pulses.  Exam reveals no gallop and no friction rub.   No murmur heard. no distal LE swelling. No calf TTP  Pulmonary/Chest: Effort normal and breath sounds normal. He has no wheezes. He has no rales. He exhibits no tenderness.  Abdominal: Soft. Bowel sounds are normal. He exhibits no distension, no abdominal bruit, no pulsatile midline mass and no mass. There is no hepatosplenomegaly. There is no tenderness. There is no rebound and no guarding.  Musculoskeletal: He exhibits edema and tenderness.  Reduced left hip internal and external rotation; b/l knee joint deformities  Lymphadenopathy:    He has no cervical adenopathy.  Neurological: He is alert and oriented to person, place, and time. He has normal reflexes.  Skin: Skin is warm and dry. No rash noted.  Psychiatric: He has a normal mood and affect. His behavior is normal. Judgment and  thought content normal.     Labs reviewed: Appointment on 02/08/2015  Component Date Value Ref Range Status  . WBC 02/08/2015 4.4  4.0 - 10.3 10e3/uL Final  . NEUT# 02/08/2015 2.7  1.5 - 6.5 10e3/uL Final  . HGB 02/08/2015 10.0* 13.0 - 17.1 g/dL Final  . HCT 02/08/2015 30.2* 38.4 - 49.9 % Final  . Platelets 02/08/2015 222  140 - 400 10e3/uL Final  . MCV 02/08/2015 94.0  79.3 - 98.0 fL Final  . MCH 02/08/2015 31.1  27.2 - 33.4 pg Final  . MCHC 02/08/2015 33.1  32.0 - 36.0 g/dL Final  . RBC 02/08/2015 3.21* 4.20 - 5.82 10e6/uL Final  . RDW 02/08/2015 16.0* 11.0 - 14.6 % Final  . lymph# 02/08/2015 0.8* 0.9 - 3.3 10e3/uL Final  . MONO# 02/08/2015 0.5  0.1 - 0.9 10e3/uL Final  . Eosinophils Absolute 02/08/2015 0.2  0.0 - 0.5 10e3/uL Final  . Basophils Absolute 02/08/2015 0.1  0.0 - 0.1 10e3/uL Final  . NEUT% 02/08/2015 62.9  39.0 - 75.0 % Final  . LYMPH% 02/08/2015 18.4  14.0 - 49.0 % Final  . MONO% 02/08/2015 12.0  0.0 - 14.0 % Final  . EOS% 02/08/2015 4.5  0.0 - 7.0 % Final  . BASO% 02/08/2015 2.2* 0.0 - 2.0 % Final  Appointment on 01/24/2015  Component Date Value Ref Range Status  . WBC 01/24/2015 5.1  4.0 - 10.3 10e3/uL Final  . NEUT# 01/24/2015 3.5  1.5 - 6.5 10e3/uL Final  . HGB 01/24/2015 10.3* 13.0 - 17.1 g/dL Final  . HCT 01/24/2015 32.1* 38.4 - 49.9 % Final  . Platelets 01/24/2015 256  140 - 400 10e3/uL Final  . MCV 01/24/2015 95.3  79.3 - 98.0 fL Final  . MCH 01/24/2015 30.6  27.2 - 33.4 pg Final  . MCHC 01/24/2015 32.1  32.0 - 36.0 g/dL Final  . RBC 01/24/2015 3.37* 4.20 - 5.82 10e6/uL Final  . RDW 01/24/2015 15.9* 11.0 - 14.6 % Final  . lymph# 01/24/2015 0.8* 0.9 - 3.3 10e3/uL Final  . MONO# 01/24/2015 0.5  0.1 - 0.9 10e3/uL Final  . Eosinophils Absolute 01/24/2015 0.3  0.0 - 0.5 10e3/uL Final  . Basophils Absolute 01/24/2015 0.1  0.0 - 0.1 10e3/uL Final  . NEUT%  01/24/2015 69.6  39.0 - 75.0 % Final  . LYMPH% 01/24/2015 15.0  14.0 - 49.0 % Final  . MONO% 01/24/2015  9.3  0.0 - 14.0 % Final  . EOS% 01/24/2015 4.9  0.0 - 7.0 % Final  . BASO% 01/24/2015 1.2  0.0 - 2.0 % Final  . nRBC 01/24/2015 0  0 - 0 % Final  Appointment on 01/10/2015  Component Date Value Ref Range Status  . WBC 01/10/2015 5.1  4.0 - 10.3 10e3/uL Final  . NEUT# 01/10/2015 3.5  1.5 - 6.5 10e3/uL Final  . HGB 01/10/2015 10.1* 13.0 - 17.1 g/dL Final  . HCT 01/10/2015 31.2* 38.4 - 49.9 % Final  . Platelets 01/10/2015 203  140 - 400 10e3/uL Final  . MCV 01/10/2015 94.5  79.3 - 98.0 fL Final  . MCH 01/10/2015 30.6  27.2 - 33.4 pg Final  . MCHC 01/10/2015 32.4  32.0 - 36.0 g/dL Final  . RBC 01/10/2015 3.30* 4.20 - 5.82 10e6/uL Final  . RDW 01/10/2015 15.6* 11.0 - 14.6 % Final  . lymph# 01/10/2015 0.9  0.9 - 3.3 10e3/uL Final  . MONO# 01/10/2015 0.5  0.1 - 0.9 10e3/uL Final  . Eosinophils Absolute 01/10/2015 0.2  0.0 - 0.5 10e3/uL Final  . Basophils Absolute 01/10/2015 0.0  0.0 - 0.1 10e3/uL Final  . NEUT% 01/10/2015 68.8  39.0 - 75.0 % Final  . LYMPH% 01/10/2015 17.0  14.0 - 49.0 % Final  . MONO% 01/10/2015 9.7  0.0 - 14.0 % Final  . EOS% 01/10/2015 3.9  0.0 - 7.0 % Final  . BASO% 01/10/2015 0.6  0.0 - 2.0 % Final  . Hold Tube, Blood Bank 01/10/2015 Blood Bank Order Cancelled   Final  Appointment on 12/27/2014  Component Date Value Ref Range Status  . Hold Tube, Blood Bank 12/27/2014 Blood Bank Order Cancelled   Final  . WBC 12/27/2014 5.7  4.0 - 10.3 10e3/uL Final  . NEUT# 12/27/2014 4.0  1.5 - 6.5 10e3/uL Final  . HGB 12/27/2014 10.1* 13.0 - 17.1 g/dL Final  . HCT 12/27/2014 31.5* 38.4 - 49.9 % Final  . Platelets 12/27/2014 298  140 - 400 10e3/uL Final  . MCV 12/27/2014 94.2  79.3 - 98.0 fL Final  . MCH 12/27/2014 30.3  27.2 - 33.4 pg Final  . MCHC 12/27/2014 32.2  32.0 - 36.0 g/dL Final  . RBC 12/27/2014 3.34* 4.20 - 5.82 10e6/uL Final  . RDW 12/27/2014 16.8* 11.0 - 14.6 % Final  . lymph# 12/27/2014 0.8* 0.9 - 3.3 10e3/uL Final  . MONO# 12/27/2014 0.6  0.1 - 0.9 10e3/uL  Final  . Eosinophils Absolute 12/27/2014 0.3  0.0 - 0.5 10e3/uL Final  . Basophils Absolute 12/27/2014 0.1  0.0 - 0.1 10e3/uL Final  . NEUT% 12/27/2014 70.4  39.0 - 75.0 % Final  . LYMPH% 12/27/2014 13.4* 14.0 - 49.0 % Final  . MONO% 12/27/2014 10.7  0.0 - 14.0 % Final  . EOS% 12/27/2014 4.5  0.0 - 7.0 % Final  . BASO% 12/27/2014 1.0  0.0 - 2.0 % Final  Office Visit on 12/17/2014  Component Date Value Ref Range Status  . Sodium 12/17/2014 135  135 - 145 mEq/L Final  . Potassium 12/17/2014 5.0  3.5 - 5.3 mEq/L Final  . Chloride 12/17/2014 103  96 - 112 mEq/L Final  . CO2 12/17/2014 21  19 - 32 mEq/L Final  . Glucose, Bld 12/17/2014 115* 70 - 99 mg/dL Final  . BUN 12/17/2014 81* 6 -  23 mg/dL Final  . Creat 12/17/2014 2.46* 0.50 - 1.35 mg/dL Final  . Total Bilirubin 12/17/2014 0.4  0.2 - 1.2 mg/dL Final  . Alkaline Phosphatase 12/17/2014 61  39 - 117 U/L Final  . AST 12/17/2014 11  0 - 37 U/L Final  . ALT 12/17/2014 9  0 - 53 U/L Final  . Total Protein 12/17/2014 5.8* 6.0 - 8.3 g/dL Final  . Albumin 12/17/2014 3.6  3.5 - 5.2 g/dL Final  . Calcium 12/17/2014 8.4  8.4 - 10.5 mg/dL Final  . GFR, Est African American 12/17/2014 27*  Final  . GFR, Est Non African American 12/17/2014 24*  Final   Comment:   The estimated GFR is a calculation valid for adults (>=98 years old) that uses the CKD-EPI algorithm to adjust for age and sex. It is   not to be used for children, pregnant women, hospitalized patients,    patients on dialysis, or with rapidly changing kidney function. According to the NKDEP, eGFR >89 is normal, 60-89 shows mild impairment, 30-59 shows moderate impairment, 15-29 shows severe impairment and <15 is ESRD.     Appointment on 12/13/2014  Component Date Value Ref Range Status  . WBC 12/13/2014 4.5  4.0 - 10.3 10e3/uL Final  . NEUT# 12/13/2014 2.9  1.5 - 6.5 10e3/uL Final  . HGB 12/13/2014 9.7* 13.0 - 17.1 g/dL Final  . HCT 12/13/2014 30.1* 38.4 - 49.9 % Final  .  Platelets 12/13/2014 275  140 - 400 10e3/uL Final  . MCV 12/13/2014 94.1  79.3 - 98.0 fL Final  . MCH 12/13/2014 30.3  27.2 - 33.4 pg Final  . MCHC 12/13/2014 32.2  32.0 - 36.0 g/dL Final  . RBC 12/13/2014 3.20* 4.20 - 5.82 10e6/uL Final  . RDW 12/13/2014 17.3* 11.0 - 14.6 % Final  . lymph# 12/13/2014 0.8* 0.9 - 3.3 10e3/uL Final  . MONO# 12/13/2014 0.5  0.1 - 0.9 10e3/uL Final  . Eosinophils Absolute 12/13/2014 0.3  0.0 - 0.5 10e3/uL Final  . Basophils Absolute 12/13/2014 0.1  0.0 - 0.1 10e3/uL Final  . NEUT% 12/13/2014 63.4  39.0 - 75.0 % Final  . LYMPH% 12/13/2014 17.3  14.0 - 49.0 % Final  . MONO% 12/13/2014 11.8  0.0 - 14.0 % Final  . EOS% 12/13/2014 6.2  0.0 - 7.0 % Final  . BASO% 12/13/2014 1.3  0.0 - 2.0 % Final  . Hold Tube, Blood Bank 12/13/2014 Blood Bank Order Cancelled   Final  Appointment on 11/29/2014  Component Date Value Ref Range Status  . WBC 11/29/2014 4.6  4.0 - 10.3 10e3/uL Final  . NEUT# 11/29/2014 3.1  1.5 - 6.5 10e3/uL Final  . HGB 11/29/2014 9.8* 13.0 - 17.1 g/dL Final  . HCT 11/29/2014 29.9* 38.4 - 49.9 % Final  . Platelets 11/29/2014 281  140 - 400 10e3/uL Final  . MCV 11/29/2014 93.5  79.3 - 98.0 fL Final  . MCH 11/29/2014 30.5  27.2 - 33.4 pg Final  . MCHC 11/29/2014 32.7  32.0 - 36.0 g/dL Final  . RBC 11/29/2014 3.20* 4.20 - 5.82 10e6/uL Final  . RDW 11/29/2014 17.9* 11.0 - 14.6 % Final  . lymph# 11/29/2014 0.8* 0.9 - 3.3 10e3/uL Final  . MONO# 11/29/2014 0.4  0.1 - 0.9 10e3/uL Final  . Eosinophils Absolute 11/29/2014 0.2  0.0 - 0.5 10e3/uL Final  . Basophils Absolute 11/29/2014 0.0  0.0 - 0.1 10e3/uL Final  . NEUT% 11/29/2014 67.3  39.0 - 75.0 % Final  .  LYMPH% 11/29/2014 16.9  14.0 - 49.0 % Final  . MONO% 11/29/2014 9.4  0.0 - 14.0 % Final  . EOS% 11/29/2014 5.3  0.0 - 7.0 % Final  . BASO% 11/29/2014 1.1  0.0 - 2.0 % Final  . Hold Tube, Blood Bank 11/29/2014 Blood Bank Order Cancelled   Final  Appointment on 11/15/2014  Component Date Value Ref  Range Status  . WBC 11/15/2014 4.8  4.0 - 10.3 10e3/uL Final  . NEUT# 11/15/2014 3.3  1.5 - 6.5 10e3/uL Final  . HGB 11/15/2014 9.7* 13.0 - 17.1 g/dL Final  . HCT 76/31/3174 30.0* 38.4 - 49.9 % Final  . Platelets 11/15/2014 267  140 - 400 10e3/uL Final  . MCV 11/15/2014 92.8  79.3 - 98.0 fL Final  . MCH 11/15/2014 29.8  27.2 - 33.4 pg Final  . MCHC 11/15/2014 32.1  32.0 - 36.0 g/dL Final  . RBC 83/22/5251 3.23* 4.20 - 5.82 10e6/uL Final  . RDW 11/15/2014 17.1* 11.0 - 14.6 % Final  . lymph# 11/15/2014 0.7* 0.9 - 3.3 10e3/uL Final  . MONO# 11/15/2014 0.4  0.1 - 0.9 10e3/uL Final  . Eosinophils Absolute 11/15/2014 0.3  0.0 - 0.5 10e3/uL Final  . Basophils Absolute 11/15/2014 0.1  0.0 - 0.1 10e3/uL Final  . NEUT% 11/15/2014 69.1  39.0 - 75.0 % Final  . LYMPH% 11/15/2014 14.8  14.0 - 49.0 % Final  . MONO% 11/15/2014 9.0  0.0 - 14.0 % Final  . EOS% 11/15/2014 5.4  0.0 - 7.0 % Final  . BASO% 11/15/2014 1.7  0.0 - 2.0 % Final  . Hold Tube, Blood Bank 11/15/2014 Blood Bank Order Cancelled   Final  . Iron 11/15/2014 50  42 - 163 ug/dL Final  . TIBC 91/86/3780 213  202 - 409 ug/dL Final  . UIBC 87/90/3790 162  117 - 376 ug/dL Final  . %SAT 84/26/7290 24  20 - 55 % Final  . Ferritin 11/15/2014 174  22 - 316 ng/ml Final  Hospital Outpatient Visit on 11/10/2014  Component Date Value Ref Range Status  . aPTT 11/10/2014 30  24 - 37 seconds Final  . WBC 11/10/2014 4.9  4.0 - 10.5 K/uL Final  . RBC 11/10/2014 3.56* 4.22 - 5.81 MIL/uL Final  . Hemoglobin 11/10/2014 10.5* 13.0 - 17.0 g/dL Final  . HCT 49/51/1496 33.3* 39.0 - 52.0 % Final  . MCV 11/10/2014 93.5  78.0 - 100.0 fL Final  . MCH 11/10/2014 29.5  26.0 - 34.0 pg Final  . MCHC 11/10/2014 31.5  30.0 - 36.0 g/dL Final  . RDW 67/74/8962 16.6* 11.5 - 15.5 % Final  . Platelets 11/10/2014 242  150 - 400 K/uL Final  . Prothrombin Time 11/10/2014 15.0  11.6 - 15.2 seconds Final  . INR 11/10/2014 1.16  0.00 - 1.49 Final  . Bone Marrow Exam  11/10/2014 SEE PATHOLOGY REPORT [id number]   Final  . Chromosome-Routine 11/10/2014 SEE SEPARATE REPORT   Final   Performed at [facility]    No results found.   Assessment/Plan   ICD-9-CM ICD-10-CM   1. Generalized anxiety disorder - stable 300.02 F41.1   2. Encounter for immunization Z23 Z23   3. Essential hypertension - stable 401.9 I10   4. Anemia due to chronic renal failure treated with erythropoietin, stage 4 (severe) - stable 285.21 N18.4    585.4 D63.1   5. CKD (chronic kidney disease), stage IV 585.4 N18.4   6. Essential thrombocythemia 238.71 D47.3  7. Ischemic cardiomyopathy 414.8 I25.5   8. Gastroesophageal reflux disease with esophagitis 530.11 K21.0   9. Retinal vein thrombosis, left - past hx 362.30 H34.9   10. Coronary artery disease involving native coronary artery of native heart without angina pectoris 414.01 I25.10   11. Chronic pain syndrome - etiology unknown but possibly due to generalized arthritis 338.4 G89.4     STOP ALPRAZOLAM due to increased risk of harm in the elderly. Recommend long acting med but pt declines  Bring all medication bottles to future appointments  Follow up in 1 month for CPE w fasting labs 2-3 days prior  Keep appointment with specialists as scheduled  Continue other medications as ordered  Get old records from previous PCP and Ortho. Pt did return with rx bottle for tylenol #2 that was several mos old.   Yazlin Ekblad S. Perlie Gold  [facility] [address] [place], [place] [address] [phone] Cell (Monday-Friday 8 AM - 5 PM) [phone] After 5 PM and follow prompts

## 2015-02-09 NOTE — Patient Instructions (Signed)
STOP ALPRAZOLAM due to increased risk of harm in the elderly  Bring all medication bottles to future appointments  Follow up in 1 month for CPE w fasting labs 2-3 days prior  Keep appointment with specialists as scheduled  Continue other medications as ordered

## 2015-02-21 ENCOUNTER — Other Ambulatory Visit (HOSPITAL_BASED_OUTPATIENT_CLINIC_OR_DEPARTMENT_OTHER): Payer: Medicare Other

## 2015-02-21 ENCOUNTER — Ambulatory Visit (HOSPITAL_BASED_OUTPATIENT_CLINIC_OR_DEPARTMENT_OTHER): Payer: Medicare Other

## 2015-02-21 VITALS — BP 151/65 | HR 57 | Temp 98.4°F

## 2015-02-21 DIAGNOSIS — D631 Anemia in chronic kidney disease: Secondary | ICD-10-CM

## 2015-02-21 DIAGNOSIS — N189 Chronic kidney disease, unspecified: Secondary | ICD-10-CM

## 2015-02-21 DIAGNOSIS — N184 Chronic kidney disease, stage 4 (severe): Secondary | ICD-10-CM

## 2015-02-21 DIAGNOSIS — D649 Anemia, unspecified: Secondary | ICD-10-CM

## 2015-02-21 DIAGNOSIS — D473 Essential (hemorrhagic) thrombocythemia: Secondary | ICD-10-CM

## 2015-02-21 LAB — CBC WITH DIFFERENTIAL/PLATELET
BASO%: 0.9 % (ref 0.0–2.0)
Basophils Absolute: 0 10*3/uL (ref 0.0–0.1)
EOS%: 4.4 % (ref 0.0–7.0)
Eosinophils Absolute: 0.2 10*3/uL (ref 0.0–0.5)
HCT: 31.3 % — ABNORMAL LOW (ref 38.4–49.9)
HGB: 10.2 g/dL — ABNORMAL LOW (ref 13.0–17.1)
LYMPH#: 0.6 10*3/uL — AB (ref 0.9–3.3)
LYMPH%: 13.2 % — AB (ref 14.0–49.0)
MCH: 31.1 pg (ref 27.2–33.4)
MCHC: 32.7 g/dL (ref 32.0–36.0)
MCV: 95.1 fL (ref 79.3–98.0)
MONO#: 0.5 10*3/uL (ref 0.1–0.9)
MONO%: 11 % (ref 0.0–14.0)
NEUT%: 70.5 % (ref 39.0–75.0)
NEUTROS ABS: 3.3 10*3/uL (ref 1.5–6.5)
PLATELETS: 207 10*3/uL (ref 140–400)
RBC: 3.29 10*6/uL — AB (ref 4.20–5.82)
RDW: 16.3 % — ABNORMAL HIGH (ref 11.0–14.6)
WBC: 4.7 10*3/uL (ref 4.0–10.3)

## 2015-02-21 MED ORDER — DARBEPOETIN ALFA 200 MCG/0.4ML IJ SOSY
200.0000 ug | PREFILLED_SYRINGE | Freq: Once | INTRAMUSCULAR | Status: AC
  Administered 2015-02-21: 200 ug via SUBCUTANEOUS
  Filled 2015-02-21: qty 0.4

## 2015-03-07 ENCOUNTER — Telehealth: Payer: Self-pay | Admitting: Oncology

## 2015-03-07 ENCOUNTER — Ambulatory Visit (HOSPITAL_BASED_OUTPATIENT_CLINIC_OR_DEPARTMENT_OTHER): Payer: Medicare Other

## 2015-03-07 ENCOUNTER — Other Ambulatory Visit (HOSPITAL_BASED_OUTPATIENT_CLINIC_OR_DEPARTMENT_OTHER): Payer: Medicare Other

## 2015-03-07 ENCOUNTER — Ambulatory Visit (HOSPITAL_BASED_OUTPATIENT_CLINIC_OR_DEPARTMENT_OTHER): Payer: Medicare Other | Admitting: Oncology

## 2015-03-07 VITALS — BP 153/73 | HR 18 | Temp 97.9°F | Resp 18 | Ht 69.0 in | Wt 153.6 lb

## 2015-03-07 DIAGNOSIS — D649 Anemia, unspecified: Secondary | ICD-10-CM | POA: Diagnosis not present

## 2015-03-07 DIAGNOSIS — D473 Essential (hemorrhagic) thrombocythemia: Secondary | ICD-10-CM | POA: Diagnosis present

## 2015-03-07 DIAGNOSIS — D509 Iron deficiency anemia, unspecified: Secondary | ICD-10-CM | POA: Diagnosis not present

## 2015-03-07 DIAGNOSIS — D47Z9 Other specified neoplasms of uncertain behavior of lymphoid, hematopoietic and related tissue: Secondary | ICD-10-CM | POA: Diagnosis not present

## 2015-03-07 DIAGNOSIS — N189 Chronic kidney disease, unspecified: Secondary | ICD-10-CM

## 2015-03-07 DIAGNOSIS — K219 Gastro-esophageal reflux disease without esophagitis: Secondary | ICD-10-CM | POA: Diagnosis not present

## 2015-03-07 DIAGNOSIS — D479 Neoplasm of uncertain behavior of lymphoid, hematopoietic and related tissue, unspecified: Secondary | ICD-10-CM | POA: Diagnosis not present

## 2015-03-07 DIAGNOSIS — N184 Chronic kidney disease, stage 4 (severe): Secondary | ICD-10-CM

## 2015-03-07 DIAGNOSIS — D631 Anemia in chronic kidney disease: Secondary | ICD-10-CM

## 2015-03-07 LAB — CBC WITH DIFFERENTIAL/PLATELET
BASO%: 1.4 % (ref 0.0–2.0)
BASOS ABS: 0.1 10*3/uL (ref 0.0–0.1)
EOS ABS: 0.2 10*3/uL (ref 0.0–0.5)
EOS%: 3.9 % (ref 0.0–7.0)
HCT: 30.3 % — ABNORMAL LOW (ref 38.4–49.9)
HGB: 9.9 g/dL — ABNORMAL LOW (ref 13.0–17.1)
LYMPH%: 12.8 % — AB (ref 14.0–49.0)
MCH: 31 pg (ref 27.2–33.4)
MCHC: 32.6 g/dL (ref 32.0–36.0)
MCV: 95.3 fL (ref 79.3–98.0)
MONO#: 0.5 10*3/uL (ref 0.1–0.9)
MONO%: 11 % (ref 0.0–14.0)
NEUT#: 3 10*3/uL (ref 1.5–6.5)
NEUT%: 70.9 % (ref 39.0–75.0)
Platelets: 155 10*3/uL (ref 140–400)
RBC: 3.18 10*6/uL — ABNORMAL LOW (ref 4.20–5.82)
RDW: 15.9 % — ABNORMAL HIGH (ref 11.0–14.6)
WBC: 4.3 10*3/uL (ref 4.0–10.3)
lymph#: 0.5 10*3/uL — ABNORMAL LOW (ref 0.9–3.3)

## 2015-03-07 MED ORDER — DARBEPOETIN ALFA 200 MCG/0.4ML IJ SOSY
200.0000 ug | PREFILLED_SYRINGE | Freq: Once | INTRAMUSCULAR | Status: AC
  Administered 2015-03-07: 200 ug via SUBCUTANEOUS
  Filled 2015-03-07: qty 0.4

## 2015-03-07 NOTE — Telephone Encounter (Signed)
per pof to sch pt appt-gave pt copy of avs °

## 2015-03-07 NOTE — Progress Notes (Signed)
  [facility] OFFICE PROGRESS NOTE   Diagnosis: Essential thrombocytosis, anemia  INTERVAL HISTORY:   [name] returns as scheduled. He continues anagrelide and every 2 week Aranesp. No bleeding or symptom of thrombosis. He reports left lower back pain. He is being evaluated by orthopedics.  Objective:  Vital signs in last 24 hours:  Blood pressure 153/73, pulse 18, temperature 97.9 F (36.6 C), temperature source Oral, resp. rate 18, height _0  (1.753 m), weight 153 lb 9.6 oz (69.673 kg), SpO2 93 %.    HEENT: No thrush or ulcers Resp: Lungs clear bilaterally Cardio: Regular rate and rhythm GI: No hepatosplenomegaly Vascular: No leg edema Musculoskeletal: No pain with motion at the left hip, tender at the left posterior pelvic area inferior and lateral to the iliac, no mass       Lab Results:  Lab Results  Component Value Date   WBC 4.3 03/07/2015   HGB 9.9* 03/07/2015   HCT 30.3* 03/07/2015   MCV 95.3 03/07/2015   PLT 155 03/07/2015   NEUTROABS 3.0 03/07/2015     Medications: I have reviewed the patient's current medications.  Assessment/Plan: 1. Essential thrombocytosis, long-standing, maintained on anagrelide  Bone marrow biopsy 11/10/2014 with a hypercellular marrow, increased atypical megakaryocytes, 46 XY karyotype, iron present, no increase in blasts 2. Severe anemia-likely secondary to renal insufficiency, chronic GI bleeding, and the myeloproliferative disorder 3. Chronic renal failure 4. History of AVMs confirmed on a capsule endoscopy April 2016 5. Gastroesophageal reflux disease 6. Coronary artery disease 7. Movement disorder 8. History of iron deficiency-maintained on iron    Disposition:  He appears stable from a hematologic standpoint. He will continue anagrelide. He is maintained on iron and every 2 week Aranesp. The hemoglobin is stable. He will continue follow-up with orthopedics for evaluation of the left "hip and low  back discomfort ".  [name] will return for an office visit in 3 months.  Betsy Coder, MD  03/07/2015  11:57 AM

## 2015-03-07 NOTE — Patient Instructions (Signed)
Darbepoetin Alfa injection What is this medicine? DARBEPOETIN ALFA (dar be POE e tin AL fa) helps your body make more red blood cells. It is used to treat anemia caused by chronic kidney failure and chemotherapy. This medicine may be used for other purposes; ask your health care provider or pharmacist if you have questions. COMMON BRAND NAME(S): Aranesp What should I tell my health care provider before I take this medicine? They need to know if you have any of these conditions: -blood clotting disorders or history of blood clots -cancer patient not on chemotherapy -cystic fibrosis -heart disease, such as angina, heart failure, or a history of a heart attack -hemoglobin level of 12 g/dL or greater -high blood pressure -low levels of folate, iron, or vitamin B12 -seizures -an unusual or allergic reaction to darbepoetin, erythropoietin, albumin, hamster proteins, latex, other medicines, foods, dyes, or preservatives -pregnant or trying to get pregnant -breast-feeding How should I use this medicine? This medicine is for injection into a vein or under the skin. It is usually given by a health care professional in a hospital or clinic setting. If you get this medicine at home, you will be taught how to prepare and give this medicine. Do not shake the solution before you withdraw a dose. Use exactly as directed. Take your medicine at regular intervals. Do not take your medicine more often than directed. It is important that you put your used needles and syringes in a special sharps container. Do not put them in a trash can. If you do not have a sharps container, call your pharmacist or healthcare provider to get one. Talk to your pediatrician regarding the use of this medicine in children. While this medicine may be used in children as young as 1 year for selected conditions, precautions do apply. Overdosage: If you think you have taken too much of this medicine contact a poison control center or  emergency room at once. NOTE: This medicine is only for you. Do not share this medicine with others. What if I miss a dose? If you miss a dose, take it as soon as you can. If it is almost time for your next dose, take only that dose. Do not take double or extra doses. What may interact with this medicine? Do not take this medicine with any of the following medications: -epoetin alfa This list may not describe all possible interactions. Give your health care provider a list of all the medicines, herbs, non-prescription drugs, or dietary supplements you use. Also tell them if you smoke, drink alcohol, or use illegal drugs. Some items may interact with your medicine. What should I watch for while using this medicine? Visit your prescriber or health care professional for regular checks on your progress and for the needed blood tests and blood pressure measurements. It is especially important for the doctor to make sure your hemoglobin level is in the desired range, to limit the risk of potential side effects and to give you the best benefit. Keep all appointments for any recommended tests. Check your blood pressure as directed. Ask your doctor what your blood pressure should be and when you should contact him or her. As your body makes more red blood cells, you may need to take iron, folic acid, or vitamin B supplements. Ask your doctor or health care provider which products are right for you. If you have kidney disease continue dietary restrictions, even though this medication can make you feel better. Talk with your doctor or health   care professional about the foods you eat and the vitamins that you take. What side effects may I notice from receiving this medicine? Side effects that you should report to your doctor or health care professional as soon as possible: -allergic reactions like skin rash, itching or hives, swelling of the face, lips, or tongue -breathing problems -changes in vision -chest  pain -confusion, trouble speaking or understanding -feeling faint or lightheaded, falls -high blood pressure -muscle aches or pains -pain, swelling, warmth in the leg -rapid weight gain -severe headaches -sudden numbness or weakness of the face, arm or leg -trouble walking, dizziness, loss of balance or coordination -seizures (convulsions) -swelling of the ankles, feet, hands -unusually weak or tired Side effects that usually do not require medical attention (report to your doctor or health care professional if they continue or are bothersome): -diarrhea -fever, chills (flu-like symptoms) -headaches -nausea, vomiting -redness, stinging, or swelling at site where injected This list may not describe all possible side effects. Call your doctor for medical advice about side effects. You may report side effects to FDA at 1-800-FDA-1088. Where should I keep my medicine? Keep out of the reach of children. Store in a refrigerator between 2 and 8 degrees C (36 and 46 degrees F). Do not freeze. Do not shake. Throw away any unused portion if using a single-dose vial. Throw away any unused medicine after the expiration date. NOTE: This sheet is a summary. It may not cover all possible information. If you have questions about this medicine, talk to your doctor, pharmacist, or health care provider.  2015, [employer]vier/Gold Standard. (2008-05-04 10:23:57)  

## 2015-03-09 ENCOUNTER — Other Ambulatory Visit: Payer: Self-pay | Admitting: *Deleted

## 2015-03-09 DIAGNOSIS — D473 Essential (hemorrhagic) thrombocythemia: Secondary | ICD-10-CM

## 2015-03-09 MED ORDER — ANAGRELIDE HCL 1 MG PO CAPS: ORAL_CAPSULE | ORAL | Status: AC

## 2015-03-21 ENCOUNTER — Other Ambulatory Visit (HOSPITAL_BASED_OUTPATIENT_CLINIC_OR_DEPARTMENT_OTHER): Payer: Medicare Other

## 2015-03-21 ENCOUNTER — Ambulatory Visit (HOSPITAL_BASED_OUTPATIENT_CLINIC_OR_DEPARTMENT_OTHER): Payer: Medicare Other

## 2015-03-21 VITALS — BP 157/61 | HR 60 | Temp 97.7°F

## 2015-03-21 DIAGNOSIS — D631 Anemia in chronic kidney disease: Secondary | ICD-10-CM

## 2015-03-21 DIAGNOSIS — D649 Anemia, unspecified: Secondary | ICD-10-CM | POA: Diagnosis not present

## 2015-03-21 DIAGNOSIS — D473 Essential (hemorrhagic) thrombocythemia: Secondary | ICD-10-CM

## 2015-03-21 DIAGNOSIS — N189 Chronic kidney disease, unspecified: Secondary | ICD-10-CM | POA: Diagnosis present

## 2015-03-21 LAB — CBC WITH DIFFERENTIAL/PLATELET
BASO%: 0.6 % (ref 0.0–2.0)
Basophils Absolute: 0 10*3/uL (ref 0.0–0.1)
EOS%: 3 % (ref 0.0–7.0)
Eosinophils Absolute: 0.2 10*3/uL (ref 0.0–0.5)
HEMATOCRIT: 32.3 % — AB (ref 38.4–49.9)
HGB: 10.3 g/dL — ABNORMAL LOW (ref 13.0–17.1)
LYMPH#: 0.9 10*3/uL (ref 0.9–3.3)
LYMPH%: 13.4 % — ABNORMAL LOW (ref 14.0–49.0)
MCH: 30.9 pg (ref 27.2–33.4)
MCHC: 31.9 g/dL — ABNORMAL LOW (ref 32.0–36.0)
MCV: 97 fL (ref 79.3–98.0)
MONO#: 0.7 10*3/uL (ref 0.1–0.9)
MONO%: 11.1 % (ref 0.0–14.0)
NEUT%: 71.9 % (ref 39.0–75.0)
NEUTROS ABS: 4.7 10*3/uL (ref 1.5–6.5)
PLATELETS: 167 10*3/uL (ref 140–400)
RBC: 3.33 10*6/uL — ABNORMAL LOW (ref 4.20–5.82)
RDW: 15.3 % — ABNORMAL HIGH (ref 11.0–14.6)
WBC: 6.6 10*3/uL (ref 4.0–10.3)

## 2015-03-21 MED ORDER — DARBEPOETIN ALFA 200 MCG/0.4ML IJ SOSY
200.0000 ug | PREFILLED_SYRINGE | Freq: Once | INTRAMUSCULAR | Status: AC
  Administered 2015-03-21: 200 ug via SUBCUTANEOUS
  Filled 2015-03-21: qty 0.4

## 2015-04-04 ENCOUNTER — Ambulatory Visit: Payer: Medicare Other

## 2015-04-04 ENCOUNTER — Other Ambulatory Visit (HOSPITAL_BASED_OUTPATIENT_CLINIC_OR_DEPARTMENT_OTHER): Payer: Medicare Other

## 2015-04-04 ENCOUNTER — Other Ambulatory Visit: Payer: Self-pay

## 2015-04-04 ENCOUNTER — Telehealth: Payer: Self-pay

## 2015-04-04 DIAGNOSIS — D631 Anemia in chronic kidney disease: Secondary | ICD-10-CM

## 2015-04-04 DIAGNOSIS — D473 Essential (hemorrhagic) thrombocythemia: Secondary | ICD-10-CM

## 2015-04-04 DIAGNOSIS — N189 Chronic kidney disease, unspecified: Secondary | ICD-10-CM

## 2015-04-04 DIAGNOSIS — D649 Anemia, unspecified: Secondary | ICD-10-CM | POA: Diagnosis not present

## 2015-04-04 LAB — CBC WITH DIFFERENTIAL/PLATELET
BASO%: 1.2 % (ref 0.0–2.0)
Basophils Absolute: 0.1 10*3/uL (ref 0.0–0.1)
EOS%: 3.5 % (ref 0.0–7.0)
Eosinophils Absolute: 0.2 10*3/uL (ref 0.0–0.5)
HCT: 34 % — ABNORMAL LOW (ref 38.4–49.9)
HGB: 11 g/dL — ABNORMAL LOW (ref 13.0–17.1)
LYMPH%: 13.8 % — AB (ref 14.0–49.0)
MCH: 31 pg (ref 27.2–33.4)
MCHC: 32.4 g/dL (ref 32.0–36.0)
MCV: 95.8 fL (ref 79.3–98.0)
MONO#: 0.7 10*3/uL (ref 0.1–0.9)
MONO%: 11.7 % (ref 0.0–14.0)
NEUT%: 69.8 % (ref 39.0–75.0)
NEUTROS ABS: 4 10*3/uL (ref 1.5–6.5)
NRBC: 0 % (ref 0–0)
Platelets: 212 10*3/uL (ref 140–400)
RBC: 3.55 10*6/uL — AB (ref 4.20–5.82)
RDW: 15.3 % — AB (ref 11.0–14.6)
WBC: 5.7 10*3/uL (ref 4.0–10.3)
lymph#: 0.8 10*3/uL — ABNORMAL LOW (ref 0.9–3.3)

## 2015-04-04 MED ORDER — PANTOPRAZOLE SODIUM 40 MG PO TBEC: 40.0000 mg | DELAYED_RELEASE_TABLET | Freq: Two times a day (BID) | ORAL | Status: AC

## 2015-04-04 MED ORDER — PANTOPRAZOLE SODIUM 40 MG PO TBEC: 40.0000 mg | DELAYED_RELEASE_TABLET | Freq: Two times a day (BID) | ORAL | Status: DC

## 2015-04-04 NOTE — Telephone Encounter (Signed)
Pt has an appointment with Dr. Havery Moros. Will refill pantoprazole until appointment.

## 2015-04-04 NOTE — Progress Notes (Signed)
Aranesp held- Hgb 11

## 2015-04-06 ENCOUNTER — Ambulatory Visit (INDEPENDENT_AMBULATORY_CARE_PROVIDER_SITE_OTHER): Payer: Medicare Other | Admitting: Cardiology

## 2015-04-06 ENCOUNTER — Encounter: Payer: Self-pay | Admitting: Cardiology

## 2015-04-06 ENCOUNTER — Ambulatory Visit: Payer: Medicare Other | Admitting: Cardiology

## 2015-04-06 VITALS — BP 110/54 | HR 60 | Ht 68.0 in | Wt 145.8 lb

## 2015-04-06 DIAGNOSIS — R55 Syncope and collapse: Secondary | ICD-10-CM

## 2015-04-06 DIAGNOSIS — N189 Chronic kidney disease, unspecified: Secondary | ICD-10-CM | POA: Diagnosis not present

## 2015-04-06 DIAGNOSIS — Z951 Presence of aortocoronary bypass graft: Secondary | ICD-10-CM

## 2015-04-06 DIAGNOSIS — N184 Chronic kidney disease, stage 4 (severe): Secondary | ICD-10-CM

## 2015-04-06 DIAGNOSIS — D631 Anemia in chronic kidney disease: Secondary | ICD-10-CM

## 2015-04-06 DIAGNOSIS — I10 Essential (primary) hypertension: Secondary | ICD-10-CM | POA: Diagnosis not present

## 2015-04-06 DIAGNOSIS — I255 Ischemic cardiomyopathy: Secondary | ICD-10-CM

## 2015-04-06 MED ORDER — METOPROLOL SUCCINATE ER 50 MG PO TB24: 50.0000 mg | ORAL_TABLET | Freq: Two times a day (BID) | ORAL | Status: AC

## 2015-04-06 NOTE — Assessment & Plan Note (Signed)
Controlled.  

## 2015-04-06 NOTE — Patient Instructions (Signed)
[name], [place], has made no changes today in your current medications or treatment plan.  Your phyisican recommends that you schedule a follow-up appointment in 6 months with Dr Aundra Dubin. You will receive a reminder letter in the mail two months in advance. If you don't receive a letter, please call our office to schedule the follow-up appointment.  If you need a refill on your cardiac medications before your next appointment, please call your pharmacy.

## 2015-04-06 NOTE — Progress Notes (Signed)
04/06/2015 [name]   1931/10/11  [id number]  Primary Physician [name], MD Primary Cardiologist: Dr Aundra Dubin  HPI:  81 year-old gentleman with a history of coronary disease. He is followed by Dr. Aundra Dubin. He had CABG in 2006. He has not had any ischemic workup since that time. An echocardiogram on 10/18/14 showed an EF of 55%.  The patient denies any history of exertional chest pain or angina. He tries to walk for exercise. He does have some problems with poor balance when he walks and he uses a cane if he has to walk a long distance. He has not been having any symptoms of congestive heart failure, orthopnea, or paroxysmal nocturnal dyspnea. No peripheral edema. He has stage IV chronic kidney disease. He had had a previous AV fistula constructed but it subsequently developed problems and it had to be ligated.He is followed by Dr Arty Baumgartner. We saw the pt last in May when he had a syncopal episode. His medications were adjusted then. His beta blocker was decreased to Toprol 50 mg BID (he was on Toprol 100 mg BID), and the pt says he stopped his apresoline on his own. He has had no further syncope or dizziness. His only compliant today is back pain for which he has received a few injections with +/- results.    Current Outpatient Prescriptions  Medication Sig Dispense Refill  . Acetaminophen-Codeine 300-15 MG TABS Take one every 6-8 hours only if needed for moderate pain 30 each 0  . acyclovir (ZOVIRAX) 200 MG capsule Take 200 mg by mouth daily.   4  . anagrelide (AGRYLIN) 1 MG capsule TAKE ONE CAPSULE BY MOUTH ONCE DAILY OR AS DIRECTED BY PHYSICIAN. 30 capsule 5  . aspirin EC 81 MG tablet Take 81 mg by mouth daily.    . cloNIDine (CATAPRES) 0.3 MG tablet Take 0.3 mg by mouth 3 (three) times daily.   0  . ferrous sulfate 325 (65 FE) MG tablet Take 1 tablet (325 mg total) by mouth 3 (three) times daily with meals. 90 tablet 12  . furosemide (LASIX) 40 MG tablet Take 40 mg by  mouth 2 (two) times daily.     . Melatonin 3 MG TABS Take 1-2 tabs by mouth at bedtime to help sleep 60 tablet 6  . metoprolol succinate (TOPROL-XL) 50 MG 24 hr tablet Take 50 mg by mouth 2 (two) times daily. Take with or immediately following a meal.    . Multiple Vitamins-Minerals (MULTIVITAMIN ADULT) TABS Take 1 tablet by mouth daily.    . pantoprazole (PROTONIX) 40 MG tablet Take 1 tablet (40 mg total) by mouth 2 (two) times daily. 60 tablet 1  . Tamsulosin HCl (FLOMAX) 0.4 MG CAPS Take 0.4 mg by mouth daily after supper.      No current facility-administered medications for this visit.    Allergies  Allergen Reactions  . Isosorbide Other (See Comments)    Caused a blood clot  . Norvasc [Amlodipine Besylate] Other (See Comments)    TIA's    Social History   Social History  . Marital Status: Married    Spouse Name: [name]  . Number of Children: 2  . Years of Education: 12   Occupational History  .      retired   Social History Main Topics  . Smoking status: Former Smoker -- 2.00 packs/day for 30 years    Types: Cigarettes    Quit date: 11/27/1978  . Smokeless tobacco: Former Systems developer  Types: [place] Ser    Quit date: 11/27/1978     Comment: quit 30 +yrs ago  . Alcohol Use: No     Comment: Quit 30 + yrs ago  . Drug Use: No  . Sexual Activity: Not on file   Other Topics Concern  . Not on file   Social History Narrative   Diet:   Do you drink/eat things with caffeine? Yes   Marital status: Married                              What year were you married? 1953   Do you live in a house, apartment, assisted living, condo, trailer, etc)? House   Is it one or more stories? 1   How many persons live in your home? 3   Do you have any pets in your home? No   Current or past profession:Retired   Do you exercise?  Walking                                                   Type & how often:   Do you have a living will?Yes, has a trust.   Do you have a DNR Form?   Do you have a  POA/HPOA forms? Yes                                                                                                  Patient lives at home with wife [name]) and son.   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  Cardiovascular: negative for chest pain, dyspnea on exertion, edema, orthopnea, palpitations, paroxysmal nocturnal dyspnea or shortness of breath Dermatological: negative for rash Respiratory: negative for cough or wheezing Urologic: negative for hematuria Abdominal: negative for nausea, vomiting, diarrhea, bright red blood per rectum, melena, or hematemesis Neurologic: negative for visual changes, syncope, or dizziness All other systems reviewed and are otherwise negative except as noted above. Recent back pain problems.   Blood pressure 110/54, pulse 60, height 5\' 8"  (1.727 m), weight 145 lb 12.8 oz (66.134 kg).  General appearance: alert, cooperative, cachectic and no distress Neck: no carotid bruit and no JVD Lungs: clear to auscultation bilaterally Heart: regular rate and rhythm Extremities: extremities normal, atraumatic, no cyanosis or edema Skin: Skin color, texture, turgor normal. No rashes or lesions Neurologic: Grossly normal  EKG NSR, SB, PAC, LAFB, 1st degree AVB  ASSESSMENT AND PLAN:   Syncope and collapse May 2016- medications adjusted then, beta blocker decreased, apressoline stopped. No further syncope.  CKD (chronic kidney disease), stage IV Followed by Dr Marval Regal  Anemia due to chronic renal failure treated with erythropoietin Followed by heme/onc  Essential hypertension Controlled  Hx of CABG 2006, no ischemic work  up since.   PLAN  No changes to his medications. If his HR goes any slower I would consider decreasing his Clonidine but will defer to Dr Marval Regal as the pt tells me he handles his  antihypertensives. He can f/u with Dr Aundra Dubin in 6 months.   Kerin Ransom K PA-C 04/06/2015 4:28 PM

## 2015-04-06 NOTE — Assessment & Plan Note (Signed)
2006, no ischemic work up since.

## 2015-04-06 NOTE — Assessment & Plan Note (Signed)
Followed by heme/ onc 

## 2015-04-06 NOTE — Assessment & Plan Note (Addendum)
Followed by Dr Coladonato 

## 2015-04-06 NOTE — Assessment & Plan Note (Signed)
May 2016- medications adjusted then, beta blocker decreased, apressoline stopped. No further syncope.

## 2015-04-18 ENCOUNTER — Ambulatory Visit (HOSPITAL_BASED_OUTPATIENT_CLINIC_OR_DEPARTMENT_OTHER): Payer: Medicare Other

## 2015-04-18 ENCOUNTER — Other Ambulatory Visit (HOSPITAL_BASED_OUTPATIENT_CLINIC_OR_DEPARTMENT_OTHER): Payer: Medicare Other

## 2015-04-18 VITALS — BP 153/58 | HR 62 | Temp 97.5°F

## 2015-04-18 DIAGNOSIS — N189 Chronic kidney disease, unspecified: Secondary | ICD-10-CM

## 2015-04-18 DIAGNOSIS — D631 Anemia in chronic kidney disease: Secondary | ICD-10-CM

## 2015-04-18 DIAGNOSIS — D649 Anemia, unspecified: Secondary | ICD-10-CM

## 2015-04-18 LAB — CBC WITH DIFFERENTIAL/PLATELET
BASO%: 1 % (ref 0.0–2.0)
Basophils Absolute: 0.1 10*3/uL (ref 0.0–0.1)
EOS ABS: 0.2 10*3/uL (ref 0.0–0.5)
EOS%: 4 % (ref 0.0–7.0)
HCT: 29.5 % — ABNORMAL LOW (ref 38.4–49.9)
HEMOGLOBIN: 9.5 g/dL — AB (ref 13.0–17.1)
LYMPH%: 16.5 % (ref 14.0–49.0)
MCH: 30.8 pg (ref 27.2–33.4)
MCHC: 32 g/dL (ref 32.0–36.0)
MCV: 96.2 fL (ref 79.3–98.0)
MONO#: 0.5 10*3/uL (ref 0.1–0.9)
MONO%: 9.3 % (ref 0.0–14.0)
NEUT%: 69.2 % (ref 39.0–75.0)
NEUTROS ABS: 3.4 10*3/uL (ref 1.5–6.5)
PLATELETS: 213 10*3/uL (ref 140–400)
RBC: 3.07 10*6/uL — AB (ref 4.20–5.82)
RDW: 15.2 % — ABNORMAL HIGH (ref 11.0–14.6)
WBC: 4.9 10*3/uL (ref 4.0–10.3)
lymph#: 0.8 10*3/uL — ABNORMAL LOW (ref 0.9–3.3)

## 2015-04-18 MED ORDER — DARBEPOETIN ALFA 200 MCG/0.4ML IJ SOSY
200.0000 ug | PREFILLED_SYRINGE | Freq: Once | INTRAMUSCULAR | Status: AC
  Administered 2015-04-18: 200 ug via SUBCUTANEOUS
  Filled 2015-04-18: qty 0.4

## 2015-04-19 ENCOUNTER — Other Ambulatory Visit: Payer: Medicare Other

## 2015-04-20 ENCOUNTER — Encounter: Payer: Medicare Other | Admitting: Internal Medicine

## 2015-05-02 ENCOUNTER — Ambulatory Visit (HOSPITAL_BASED_OUTPATIENT_CLINIC_OR_DEPARTMENT_OTHER): Payer: Medicare Other

## 2015-05-02 ENCOUNTER — Other Ambulatory Visit (HOSPITAL_BASED_OUTPATIENT_CLINIC_OR_DEPARTMENT_OTHER): Payer: Medicare Other

## 2015-05-02 VITALS — BP 157/61 | HR 63 | Temp 97.4°F

## 2015-05-02 DIAGNOSIS — D649 Anemia, unspecified: Secondary | ICD-10-CM | POA: Diagnosis not present

## 2015-05-02 DIAGNOSIS — N189 Chronic kidney disease, unspecified: Secondary | ICD-10-CM | POA: Diagnosis present

## 2015-05-02 DIAGNOSIS — D631 Anemia in chronic kidney disease: Secondary | ICD-10-CM

## 2015-05-02 LAB — CBC WITH DIFFERENTIAL/PLATELET
BASO%: 1.8 % (ref 0.0–2.0)
Basophils Absolute: 0.1 10*3/uL (ref 0.0–0.1)
EOS%: 4.3 % (ref 0.0–7.0)
Eosinophils Absolute: 0.2 10*3/uL (ref 0.0–0.5)
HEMATOCRIT: 28.6 % — AB (ref 38.4–49.9)
HEMOGLOBIN: 9 g/dL — AB (ref 13.0–17.1)
LYMPH#: 0.7 10*3/uL — AB (ref 0.9–3.3)
LYMPH%: 15.8 % (ref 14.0–49.0)
MCH: 30.4 pg (ref 27.2–33.4)
MCHC: 31.4 g/dL — AB (ref 32.0–36.0)
MCV: 96.6 fL (ref 79.3–98.0)
MONO#: 0.5 10*3/uL (ref 0.1–0.9)
MONO%: 11.6 % (ref 0.0–14.0)
NEUT#: 2.9 10*3/uL (ref 1.5–6.5)
NEUT%: 66.5 % (ref 39.0–75.0)
Platelets: 224 10*3/uL (ref 140–400)
RBC: 2.96 10*6/uL — ABNORMAL LOW (ref 4.20–5.82)
RDW: 16.5 % — AB (ref 11.0–14.6)
WBC: 4.3 10*3/uL (ref 4.0–10.3)

## 2015-05-02 MED ORDER — DARBEPOETIN ALFA 200 MCG/0.4ML IJ SOSY
200.0000 ug | PREFILLED_SYRINGE | Freq: Once | INTRAMUSCULAR | Status: AC
  Administered 2015-05-02: 200 ug via SUBCUTANEOUS
  Filled 2015-05-02: qty 0.4

## 2015-05-16 ENCOUNTER — Other Ambulatory Visit: Payer: Self-pay | Admitting: Hematology

## 2015-05-16 ENCOUNTER — Other Ambulatory Visit (HOSPITAL_BASED_OUTPATIENT_CLINIC_OR_DEPARTMENT_OTHER): Payer: Medicare Other

## 2015-05-16 ENCOUNTER — Ambulatory Visit: Payer: Medicare Other

## 2015-05-16 VITALS — BP 194/74 | HR 66 | Temp 97.7°F

## 2015-05-16 DIAGNOSIS — D631 Anemia in chronic kidney disease: Secondary | ICD-10-CM

## 2015-05-16 DIAGNOSIS — N189 Chronic kidney disease, unspecified: Secondary | ICD-10-CM

## 2015-05-16 LAB — CBC WITH DIFFERENTIAL/PLATELET
BASO%: 0.2 % (ref 0.0–2.0)
Basophils Absolute: 0 10*3/uL (ref 0.0–0.1)
EOS%: 2.9 % (ref 0.0–7.0)
Eosinophils Absolute: 0.1 10*3/uL (ref 0.0–0.5)
HCT: 31.8 % — ABNORMAL LOW (ref 38.4–49.9)
HGB: 10 g/dL — ABNORMAL LOW (ref 13.0–17.1)
LYMPH#: 0.7 10*3/uL — AB (ref 0.9–3.3)
LYMPH%: 16.8 % (ref 14.0–49.0)
MCH: 30.9 pg (ref 27.2–33.4)
MCHC: 31.4 g/dL — AB (ref 32.0–36.0)
MCV: 98.1 fL — ABNORMAL HIGH (ref 79.3–98.0)
MONO#: 0.5 10*3/uL (ref 0.1–0.9)
MONO%: 12 % (ref 0.0–14.0)
NEUT#: 2.8 10*3/uL (ref 1.5–6.5)
NEUT%: 68.1 % (ref 39.0–75.0)
Platelets: 189 10*3/uL (ref 140–400)
RBC: 3.24 10*6/uL — ABNORMAL LOW (ref 4.20–5.82)
RDW: 15.7 % — ABNORMAL HIGH (ref 11.0–14.6)
WBC: 4.2 10*3/uL (ref 4.0–10.3)

## 2015-05-16 MED ORDER — DARBEPOETIN ALFA 200 MCG/0.4ML IJ SOSY: 200.0000 ug | PREFILLED_SYRINGE | Freq: Once | INTRAMUSCULAR | Status: DC

## 2015-05-21 ENCOUNTER — Other Ambulatory Visit: Payer: Self-pay | Admitting: Gastroenterology

## 2015-05-26 ENCOUNTER — Other Ambulatory Visit: Payer: Self-pay | Admitting: *Deleted

## 2015-05-26 DIAGNOSIS — D473 Essential (hemorrhagic) thrombocythemia: Secondary | ICD-10-CM

## 2015-05-31 ENCOUNTER — Other Ambulatory Visit: Payer: Medicare Other

## 2015-05-31 ENCOUNTER — Ambulatory Visit: Payer: Medicare Other

## 2015-06-08 ENCOUNTER — Ambulatory Visit (INDEPENDENT_AMBULATORY_CARE_PROVIDER_SITE_OTHER): Payer: Medicare Other | Admitting: Gastroenterology

## 2015-06-08 ENCOUNTER — Encounter: Payer: Self-pay | Admitting: Gastroenterology

## 2015-06-08 VITALS — BP 116/58 | HR 72 | Ht 68.0 in | Wt 148.0 lb

## 2015-06-08 DIAGNOSIS — D631 Anemia in chronic kidney disease: Secondary | ICD-10-CM

## 2015-06-08 DIAGNOSIS — D649 Anemia, unspecified: Secondary | ICD-10-CM | POA: Diagnosis not present

## 2015-06-08 DIAGNOSIS — K558 Other vascular disorders of intestine: Secondary | ICD-10-CM

## 2015-06-08 DIAGNOSIS — N189 Chronic kidney disease, unspecified: Secondary | ICD-10-CM

## 2015-06-08 DIAGNOSIS — K5520 Angiodysplasia of colon without hemorrhage: Secondary | ICD-10-CM

## 2015-06-08 DIAGNOSIS — K2100 Gastro-esophageal reflux disease with esophagitis, without bleeding: Secondary | ICD-10-CM

## 2015-06-08 NOTE — Progress Notes (Signed)
HPI :  82 y/o male with multifactorial anemia, here for a follow up visit. Former patient of Dr. Deatra Ina, new to me.   He reports he has had a GI workup for anemia, thought to due to a combination of iron deficiency, CKD, and a myeloproliferative disorder. He has had EGD, colonoscopy, and capsule endoscopy per Dr. Deatra Ina as outlined below over the past 2 years. He denies any blood in the stools at this time. He has roughly one BM per day or 2 days. He has some baseline constipation. No abdominal pains. He thinks he has lost a few lbs recently, and he thinks due to decreased appetite. No nausea or vomiting. No heartburn that he endorses routinely, only rarely. He states protonix works well for him. He takes protonix '80mg'$  q AM due to history of severe esophagitis and esophageal strictures.  No problems swallowing at baseline although occasional swallowing difficulty with meat. He has a history of esophageal stricture leading to dilation but has not needed this in a long time.   Hgbs have been in the 10s in recent months and stable. He has had a prior blood transfusion several months ago for anemia, he thinks he has had 4-5 total over time. He had a bone marrow biopsy in June 2016. He has essential thrombocytosis -myleoproliferative disorder and on medication for this. He otherwise has CKD on erythropoiten, and was evaluated previously for dialysis but not on dialysis. He has a scheduled blood draw for Monday to recheck CBC.   He reports he is ambulatory but is limited by chronic back pains in regards to his ability to exert himself. We discussed potential further GI workup given results of his testing as outlined below.   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 Chronic kidney disease   . Thrombocythemia ([place])   . Esophageal stricture   . Anemia   . Anemia due to chronic renal failure treated with erythropoietin 03/24/2011  . Retinal vein thrombosis, left   . Coronary artery disease   . Hypertension   . GERD (gastroesophageal reflux disease)   . Arthritis   . Anginal pain ([place]) 1990  . Erosive esophagitis     grade 4  . Diverticulosis   . Internal hemorrhoids   . HOH (hard of hearing)   . Blood transfusion without reported diagnosis      Past Surgical History  Procedure Laterality Date  . Replacement total knee Right 04/19/08  . Av fistula placement Left 12/01/2013    Procedure: RADIOCEPHALIC ARTERIOVENOUS (AV) FISTULA CREATION;  Surgeon: Elam Dutch, MD;  Location: [facility];  Service: Vascular;  Laterality: Left;  . Coronary artery bypass graft  2008    x 5  . Eye surgery Left yrs ago    vitrectomy  . Eye brow lift Right     01-11-2014  . Inguinal hernia repair Left 05/08/01  . Colonoscopy N/A 02/02/2014    Procedure: COLONOSCOPY;  Surgeon: Inda Castle, MD;  Location: [facility];  Service: Endoscopy;  Laterality: N/A;  . Esophagogastroduodenoscopy N/A 02/02/2014    Procedure:  ESOPHAGOGASTRODUODENOSCOPY (EGD);  Surgeon: Inda Castle, MD;  Location: [facility];  Service: Endoscopy;  Laterality: N/A;  . Ligation of arteriovenous  fistula Left 07/06/2014    Procedure: LIGATION OF ARTERIOVENOUS  FISTULA;  Surgeon: Elam Dutch, MD;  Location: [facility];  Service: Vascular;  Laterality: Left;  . Joint replacement    . Vasectomy     Family History  Problem Relation Age of Onset  . Dementia Mother   . Arthritis Mother   . Angina Father   . Heart attack Neg Hx    Social History  Substance Use Topics  . Smoking  status: Former Smoker -- 2.00 packs/day for 30 years    Types: Cigarettes    Quit date: 11/27/1978  . Smokeless tobacco: Former Systems developer    Types: [place] date: 11/27/1978     Comment: quit 30 +yrs ago  . Alcohol Use: No     Comment: Quit 30 + yrs ago   Current Outpatient Prescriptions  Medication Sig Dispense Refill  . Acetaminophen-Codeine 300-15 MG TABS Take one every 6-8 hours only if needed for moderate pain 30 each 0  . acyclovir (ZOVIRAX) 200 MG capsule Take 200 mg by mouth daily.   4  . anagrelide (AGRYLIN) 1 MG capsule TAKE ONE CAPSULE BY MOUTH ONCE DAILY OR AS DIRECTED BY PHYSICIAN. 30 capsule 5  . aspirin EC 81 MG tablet Take 81 mg by mouth daily.    . cloNIDine (CATAPRES) 0.3 MG tablet Take 0.3 mg by mouth 3 (three) times daily.   0  . ferrous sulfate 325 (65 FE) MG tablet Take 1 tablet (325 mg total) by mouth 3 (three) times daily with meals. 90 tablet 12  . furosemide (LASIX) 40 MG tablet Take 40 mg by mouth 2 (two) times daily.     . Melatonin 3 MG TABS Take 1-2 tabs by mouth at bedtime to help sleep 60 tablet 6  . metoprolol succinate (TOPROL-XL) 50 MG 24 hr tablet Take 1 tablet (50 mg total) by mouth 2 (two) times daily. Take with or immediately following a meal. 60 tablet 11  . Multiple Vitamins-Minerals (MULTIVITAMIN ADULT) TABS Take 1 tablet by mouth daily.    . pantoprazole (PROTONIX) 40 MG tablet Take 1 tablet (40 mg total) by mouth 2 (two) times daily. 60 tablet 1  . Tamsulosin HCl (FLOMAX) 0.4 MG CAPS Take 0.4 mg by mouth daily after supper.      No current facility-administered medications for this visit.   Allergies  Allergen Reactions  . Isosorbide Other (See Comments)    Caused a blood clot  . Norvasc [Amlodipine Besylate] Other (See Comments)    TIA's     Review of Systems: All systems reviewed and negative except where noted in HPI.   CBC Latest Ref Rng 05/16/2015 05/02/2015 04/18/2015  WBC 4.0 - 10.3 10e3/uL 4.2 4.3 4.9  Hemoglobin 13.0 - 17.1  g/dL 10.0(L) 9.0(L) 9.5(L)  Hematocrit 38.4 - 49.9 % 31.8(L) 28.6(L) 29.5(L)  Platelets 140 - 400 10e3/uL 189 224 213    Lab Results  Component Value Date   CREATININE 2.46* 12/17/2014   BUN 81* 12/17/2014   NA 135 12/17/2014   K 5.0 12/17/2014   CL 103 12/17/2014   CO2 21 12/17/2014    Lab Results  Component Value Date   ALT 9 12/17/2014   AST 11 12/17/2014   ALKPHOS 61 12/17/2014   BILITOT 0.4 12/17/2014   Lab Results  Component Value  Date   FERRITIN 174 11/15/2014   Lab Results  Component Value Date   IRON 50 11/15/2014   TIBC 213 11/15/2014   FERRITIN 174 11/15/2014     Physical Exam: BP 116/58 mmHg  Pulse 72  Ht '5\' 8"'$  (1.727 m)  Wt 148 lb (67.132 kg)  BMI 22.51 kg/m2 Constitutional: Pleasant,well-developed, male in no acute distress. HEENT: Normocephalic and atraumatic. Conjunctivae are normal. No scleral icterus. Neck supple.  Cardiovascular: Normal rate, regular rhythm.  Pulmonary/chest: Effort normal and breath sounds normal. No wheezing, rales or rhonchi. Abdominal: exam done with patient sitting in chair due to poor mobility. Soft, nondistended, nontender although limited exam. Bowel sounds active throughout. There are no masses palpable. No hepatomegaly. Extremities: trace edema Lymphadenopathy: No cervical adenopathy noted. Neurological: Alert and oriented to person place and time. Skin: Skin is warm and dry. No rashes noted. Psychiatric: Normal mood and affect. Behavior is normal.   ASSESSMENT AND PLAN: 82 y/o male with history of multifactorial anemia, thought to be due to iron deficiency (AVMs, also h/o esophagitis?), chronic kidney disease, and myeloproliferative disorder. His last ferritin and iron levels this summer seemed okay. His Hgb has been stable lately but he has had prior PRBC transfusions for his anemia.   I reviewed his prior endoscopies with him today. He has had a history of diverticulosis, and his last colonoscopy done to evaluate  this anemia in 2015 was limited by a poor prep. No mass lesions noted however other types of colonic lesions could have been missed due to the prep. His EGD showed severe esophagitis and now on BID PPI. 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 He has not had an EUS for confirmation however stability on exam over time is reassuring. His capsule study has shown small AVMs which could be intermittently bleeding and contributing to anemia. I discussed with him that we suspect his anemia is multifactorial and from a GI perspective, AVMs would be the most likely contributor, however can't completely rule out a colonic source given his prior exam findings. At this time his Hgb and iron studies appears stable. We discussed the utility of repeating a colonoscopy or upper endoscopy given prior findings and he is not interested in further evaluation at this time given his comorbidities and stability of labs. His preference is to monitor labs at this time and will touch base with me if Hgb or iron levels dramatically worsens, or if he has overt bleeding, otherwise he does not wish to have further workup which is reasonable. He can follow up as needed with [place] and verbalized understanding of our discussion of these issues. He prefers to continue BID protonix at this time otherwise as it works well for his heartburn and he has not had much dysphagia on this regimen.   Big Arm Cellar, MD [facility] Pager [phone]

## 2015-06-08 NOTE — Patient Instructions (Signed)
Thank you for choosing [facility] to care for you.  

## 2015-06-13 ENCOUNTER — Ambulatory Visit: Payer: Medicare Other

## 2015-06-13 ENCOUNTER — Other Ambulatory Visit: Payer: Medicare Other

## 2015-06-13 ENCOUNTER — Ambulatory Visit: Payer: Medicare Other | Admitting: Oncology

## 2015-06-15 ENCOUNTER — Telehealth: Payer: Self-pay | Admitting: Nurse Practitioner

## 2015-06-15 NOTE — Telephone Encounter (Signed)
Patient called in to reschedule his missed lab kristin and inj appt.  Next avail is 1/20 and he request to just move all appts to 1/30.  He req to just have lab and inj on 1/16 and i have left amy h a message about this and will call the patient back and he is aware

## 2015-06-23 ENCOUNTER — Telehealth: Payer: Self-pay | Admitting: Gastroenterology

## 2015-06-24 NOTE — Telephone Encounter (Signed)
Left message for patient to call back  

## 2015-06-26 ENCOUNTER — Other Ambulatory Visit: Payer: Self-pay | Admitting: Gastroenterology

## 2015-06-27 ENCOUNTER — Telehealth: Payer: Self-pay | Admitting: *Deleted

## 2015-06-27 ENCOUNTER — Ambulatory Visit: Payer: Medicare Other

## 2015-06-27 ENCOUNTER — Other Ambulatory Visit: Payer: Medicare Other

## 2015-06-27 NOTE — Telephone Encounter (Signed)
Dr. Havery Moros, this pt wants protonix increased to 3 times daily. Is this ok?

## 2015-06-27 NOTE — Telephone Encounter (Signed)
Called patient at home with no answer or answer machine

## 2015-06-27 NOTE — Telephone Encounter (Signed)
Could you clarify why he wishes to be on three times daily dosing? Max dose is 40mg  twice daily which he is already one. If protonix isn't working at that dose, may need to add something else or change his regimen. Thanks

## 2015-06-28 NOTE — Telephone Encounter (Signed)
Pt states he will continue to take protonix twice daily. He states that he gets nauseous after meals but will take tums after eating to see if it helps. He will call back if he has any other problems.

## 2015-07-04 ENCOUNTER — Other Ambulatory Visit (HOSPITAL_BASED_OUTPATIENT_CLINIC_OR_DEPARTMENT_OTHER): Payer: Medicare Other

## 2015-07-04 ENCOUNTER — Telehealth: Payer: Self-pay | Admitting: Oncology

## 2015-07-04 ENCOUNTER — Ambulatory Visit (HOSPITAL_BASED_OUTPATIENT_CLINIC_OR_DEPARTMENT_OTHER): Payer: Medicare Other

## 2015-07-04 ENCOUNTER — Ambulatory Visit (HOSPITAL_BASED_OUTPATIENT_CLINIC_OR_DEPARTMENT_OTHER): Payer: Medicare Other | Admitting: Nurse Practitioner

## 2015-07-04 VITALS — BP 181/91 | HR 109 | Temp 97.6°F | Resp 21 | Ht 68.0 in | Wt 150.0 lb

## 2015-07-04 DIAGNOSIS — D473 Essential (hemorrhagic) thrombocythemia: Secondary | ICD-10-CM

## 2015-07-04 DIAGNOSIS — I2510 Atherosclerotic heart disease of native coronary artery without angina pectoris: Secondary | ICD-10-CM | POA: Diagnosis not present

## 2015-07-04 DIAGNOSIS — N189 Chronic kidney disease, unspecified: Secondary | ICD-10-CM

## 2015-07-04 DIAGNOSIS — D471 Chronic myeloproliferative disease: Secondary | ICD-10-CM | POA: Diagnosis not present

## 2015-07-04 DIAGNOSIS — K922 Gastrointestinal hemorrhage, unspecified: Secondary | ICD-10-CM

## 2015-07-04 DIAGNOSIS — D631 Anemia in chronic kidney disease: Secondary | ICD-10-CM

## 2015-07-04 DIAGNOSIS — E611 Iron deficiency: Secondary | ICD-10-CM | POA: Diagnosis not present

## 2015-07-04 DIAGNOSIS — D649 Anemia, unspecified: Secondary | ICD-10-CM

## 2015-07-04 LAB — CBC WITH DIFFERENTIAL/PLATELET
BASO%: 1.1 % (ref 0.0–2.0)
Basophils Absolute: 0 10*3/uL (ref 0.0–0.1)
EOS%: 3.2 % (ref 0.0–7.0)
Eosinophils Absolute: 0.1 10*3/uL (ref 0.0–0.5)
HCT: 27.6 % — ABNORMAL LOW (ref 38.4–49.9)
HEMOGLOBIN: 8.8 g/dL — AB (ref 13.0–17.1)
LYMPH#: 0.8 10*3/uL — AB (ref 0.9–3.3)
LYMPH%: 20.4 % (ref 14.0–49.0)
MCH: 30.1 pg (ref 27.2–33.4)
MCHC: 31.8 g/dL — ABNORMAL LOW (ref 32.0–36.0)
MCV: 94.7 fL (ref 79.3–98.0)
MONO#: 0.4 10*3/uL (ref 0.1–0.9)
MONO%: 8.9 % (ref 0.0–14.0)
NEUT%: 66.4 % (ref 39.0–75.0)
NEUTROS ABS: 2.6 10*3/uL (ref 1.5–6.5)
PLATELETS: 183 10*3/uL (ref 140–400)
RBC: 2.92 10*6/uL — AB (ref 4.20–5.82)
RDW: 16.2 % — AB (ref 11.0–14.6)
WBC: 4 10*3/uL (ref 4.0–10.3)

## 2015-07-04 MED ORDER — DARBEPOETIN ALFA 200 MCG/0.4ML IJ SOSY
200.0000 ug | PREFILLED_SYRINGE | Freq: Once | INTRAMUSCULAR | Status: AC
  Administered 2015-07-04: 200 ug via SUBCUTANEOUS
  Filled 2015-07-04: qty 0.4

## 2015-07-04 NOTE — Progress Notes (Signed)
  [facility] OFFICE PROGRESS NOTE   Diagnosis:  Essential thrombocytosis, anemia  INTERVAL HISTORY:   Mr. Lengacher returns as scheduled. He continues anagrelide. Last Aranesp injection given 05/02/2015. Injection held 05/16/2015 due to elevated blood pressure. He denies bleeding. No symptoms of thrombosis. He reports persistent left lower back/hip pain. He continues follow-up with orthopedics.  Objective:  Vital signs in last 24 hours:  Blood pressure 181/91, pulse 109, temperature 97.6 F (36.4 C), temperature source Oral, resp. rate 21, height '5\' 8"'$  (1.727 m), weight 150 lb (68.04 kg), SpO2 99 %.    HEENT: No thrush or ulcers. Resp: Lungs clear bilaterally. Cardio: Regular rate and rhythm. GI: Abdomen soft and nontender. No organomegaly. Vascular: No leg edema.   Lab Results:  Lab Results  Component Value Date   WBC 4.0 07/04/2015   HGB 8.8* 07/04/2015   HCT 27.6* 07/04/2015   MCV 94.7 07/04/2015   PLT 183 07/04/2015   NEUTROABS 2.6 07/04/2015    Imaging:  No results found.  Medications: I have reviewed the patient's current medications.  Assessment/Plan: 1. Essential thrombocytosis, long-standing, maintained on anagrelide  Bone marrow biopsy 11/10/2014 with a hypercellular marrow, increased atypical megakaryocytes, 46 XY karyotype, iron present, no increase in blasts 2. Severe anemia-likely secondary to renal insufficiency, chronic GI bleeding, and the myeloproliferative disorder 3. Chronic renal failure 4. History of AVMs confirmed on a capsule endoscopy April 2016 5. Gastroesophageal reflux disease 6. Coronary artery disease 7. Movement disorder 8. History of iron deficiency-maintained on iron   Disposition: Mr. Viernes will continue anagrelide at the current dose. He will receive Aranesp today as scheduled. Plan to continue Aranesp every 2 weeks. We will see him in follow-up in 3 months. He will follow-up with orthopedics regarding the low  back and left hip pain. He understands he should not be driving while taking pain medication.  Plan reviewed with Dr. Benay Spice.    Ned Card ANP/GNP-BC   07/04/2015  3:57 PM

## 2015-07-04 NOTE — Telephone Encounter (Signed)
Gave patient avs report and appointments for February thru May.  °

## 2015-07-05 LAB — IRON AND TIBC
%SAT: 18 % — ABNORMAL LOW (ref 20–55)
IRON: 44 ug/dL (ref 42–163)
TIBC: 241 ug/dL (ref 202–409)
UIBC: 197 ug/dL (ref 117–376)

## 2015-07-05 LAB — FERRITIN: FERRITIN: 125 ng/mL (ref 22–316)

## 2015-07-14 ENCOUNTER — Telehealth: Payer: Self-pay | Admitting: Gastroenterology

## 2015-07-14 NOTE — Telephone Encounter (Signed)
Yes he should. His last iron stores appeared stable and this will help keep them that way.

## 2015-07-14 NOTE — Telephone Encounter (Signed)
Pt wants to know if he should still take Ferrous Sulfate.

## 2015-07-15 MED ORDER — FERROUS SULFATE 325 (65 FE) MG PO TABS: 325.0000 mg | ORAL_TABLET | Freq: Three times a day (TID) | ORAL | Status: AC

## 2015-07-15 NOTE — Telephone Encounter (Signed)
Called pt and left message for pt to call back.

## 2015-07-18 ENCOUNTER — Other Ambulatory Visit (HOSPITAL_BASED_OUTPATIENT_CLINIC_OR_DEPARTMENT_OTHER): Payer: Medicare Other

## 2015-07-18 ENCOUNTER — Ambulatory Visit (HOSPITAL_BASED_OUTPATIENT_CLINIC_OR_DEPARTMENT_OTHER): Payer: Medicare Other

## 2015-07-18 VITALS — BP 150/56 | HR 57 | Temp 98.3°F

## 2015-07-18 DIAGNOSIS — D631 Anemia in chronic kidney disease: Secondary | ICD-10-CM

## 2015-07-18 DIAGNOSIS — N189 Chronic kidney disease, unspecified: Secondary | ICD-10-CM

## 2015-07-18 DIAGNOSIS — D649 Anemia, unspecified: Secondary | ICD-10-CM

## 2015-07-18 DIAGNOSIS — D473 Essential (hemorrhagic) thrombocythemia: Secondary | ICD-10-CM

## 2015-07-18 LAB — CBC WITH DIFFERENTIAL/PLATELET
BASO%: 1.2 % (ref 0.0–2.0)
BASOS ABS: 0 10*3/uL (ref 0.0–0.1)
EOS ABS: 0.1 10*3/uL (ref 0.0–0.5)
EOS%: 2.4 % (ref 0.0–7.0)
HCT: 26.6 % — ABNORMAL LOW (ref 38.4–49.9)
HEMOGLOBIN: 8.4 g/dL — AB (ref 13.0–17.1)
LYMPH%: 18.4 % (ref 14.0–49.0)
MCH: 30.4 pg (ref 27.2–33.4)
MCHC: 31.7 g/dL — AB (ref 32.0–36.0)
MCV: 95.8 fL (ref 79.3–98.0)
MONO#: 0.4 10*3/uL (ref 0.1–0.9)
MONO%: 11 % (ref 0.0–14.0)
NEUT#: 2.4 10*3/uL (ref 1.5–6.5)
NEUT%: 67 % (ref 39.0–75.0)
Platelets: 173 10*3/uL (ref 140–400)
RBC: 2.77 10*6/uL — ABNORMAL LOW (ref 4.20–5.82)
RDW: 17 % — AB (ref 11.0–14.6)
WBC: 3.5 10*3/uL — ABNORMAL LOW (ref 4.0–10.3)
lymph#: 0.6 10*3/uL — ABNORMAL LOW (ref 0.9–3.3)

## 2015-07-18 MED ORDER — DARBEPOETIN ALFA 200 MCG/0.4ML IJ SOSY
200.0000 ug | PREFILLED_SYRINGE | Freq: Once | INTRAMUSCULAR | Status: AC
  Administered 2015-07-18: 200 ug via SUBCUTANEOUS
  Filled 2015-07-18: qty 0.4

## 2015-08-01 ENCOUNTER — Ambulatory Visit (HOSPITAL_BASED_OUTPATIENT_CLINIC_OR_DEPARTMENT_OTHER): Payer: Medicare Other

## 2015-08-01 ENCOUNTER — Other Ambulatory Visit (HOSPITAL_BASED_OUTPATIENT_CLINIC_OR_DEPARTMENT_OTHER): Payer: Medicare Other

## 2015-08-01 VITALS — BP 161/63 | HR 53 | Temp 97.5°F

## 2015-08-01 DIAGNOSIS — D631 Anemia in chronic kidney disease: Secondary | ICD-10-CM

## 2015-08-01 DIAGNOSIS — D473 Essential (hemorrhagic) thrombocythemia: Secondary | ICD-10-CM | POA: Diagnosis present

## 2015-08-01 DIAGNOSIS — N189 Chronic kidney disease, unspecified: Secondary | ICD-10-CM

## 2015-08-01 DIAGNOSIS — D649 Anemia, unspecified: Secondary | ICD-10-CM | POA: Diagnosis not present

## 2015-08-01 LAB — CBC WITH DIFFERENTIAL/PLATELET
BASO%: 1.1 % (ref 0.0–2.0)
Basophils Absolute: 0 10*3/uL (ref 0.0–0.1)
EOS ABS: 0.1 10*3/uL (ref 0.0–0.5)
EOS%: 3.2 % (ref 0.0–7.0)
HCT: 27.4 % — ABNORMAL LOW (ref 38.4–49.9)
HEMOGLOBIN: 8.5 g/dL — AB (ref 13.0–17.1)
LYMPH%: 20.6 % (ref 14.0–49.0)
MCH: 29.2 pg (ref 27.2–33.4)
MCHC: 30.8 g/dL — ABNORMAL LOW (ref 32.0–36.0)
MCV: 94.8 fL (ref 79.3–98.0)
MONO#: 0.4 10*3/uL (ref 0.1–0.9)
MONO%: 10.1 % (ref 0.0–14.0)
NEUT%: 65 % (ref 39.0–75.0)
NEUTROS ABS: 2.5 10*3/uL (ref 1.5–6.5)
Platelets: 165 10*3/uL (ref 140–400)
RBC: 2.9 10*6/uL — ABNORMAL LOW (ref 4.20–5.82)
RDW: 15.9 % — AB (ref 11.0–14.6)
WBC: 3.9 10*3/uL — AB (ref 4.0–10.3)
lymph#: 0.8 10*3/uL — ABNORMAL LOW (ref 0.9–3.3)

## 2015-08-01 MED ORDER — DARBEPOETIN ALFA 200 MCG/0.4ML IJ SOSY
200.0000 ug | PREFILLED_SYRINGE | Freq: Once | INTRAMUSCULAR | Status: AC
  Administered 2015-08-01: 200 ug via SUBCUTANEOUS
  Filled 2015-08-01: qty 0.4

## 2015-08-01 NOTE — Progress Notes (Signed)
[name] here for lab and injection today.  He has a bruise on right side of his face from a fall he had 4 days ago.  States that he had mistakenly taken 2 pain pills instead of one.  He knows that they need someone to help him and his wife.  Suggested that he use his walker more.

## 2015-08-02 ENCOUNTER — Other Ambulatory Visit: Payer: Self-pay | Admitting: *Deleted

## 2015-08-02 DIAGNOSIS — N189 Chronic kidney disease, unspecified: Principal | ICD-10-CM

## 2015-08-02 DIAGNOSIS — D631 Anemia in chronic kidney disease: Secondary | ICD-10-CM

## 2015-08-02 NOTE — Progress Notes (Signed)
Ladell Pier, MD   Sent: Mon August 01, 2015 5:46 PM    To: Brien Few, RN        Result Note     Add ferritin, iron, TIBC day of next erythropoietin injection

## 2015-08-03 ENCOUNTER — Other Ambulatory Visit: Payer: Self-pay | Admitting: Physical Medicine and Rehabilitation

## 2015-08-03 ENCOUNTER — Ambulatory Visit
Admission: RE | Admit: 2015-08-03 | Discharge: 2015-08-03 | Disposition: A | Discharge status: Home / Self Care | Payer: Medicare Other | Source: Ambulatory Visit | Attending: Physical Medicine and Rehabilitation | Admitting: Physical Medicine and Rehabilitation

## 2015-08-03 DIAGNOSIS — W19XXXA Unspecified fall, initial encounter: Secondary | ICD-10-CM

## 2015-08-04 ENCOUNTER — Ambulatory Visit
Admission: RE | Admit: 2015-08-04 | Discharge: 2015-08-04 | Disposition: A | Discharge status: Home / Self Care | Payer: Medicare Other | Source: Ambulatory Visit | Attending: Physical Medicine and Rehabilitation | Admitting: Physical Medicine and Rehabilitation

## 2015-08-07 ENCOUNTER — Encounter (HOSPITAL_COMMUNITY): Payer: Self-pay | Admitting: *Deleted

## 2015-08-07 ENCOUNTER — Emergency Department (HOSPITAL_COMMUNITY)
Admission: EM | Admit: 2015-08-07 | Discharge: 2015-08-07 | Disposition: A | Discharge status: Home / Self Care | Payer: Medicare Other | Attending: Emergency Medicine | Admitting: Emergency Medicine

## 2015-08-07 DIAGNOSIS — Z87891 Personal history of nicotine dependence: Secondary | ICD-10-CM | POA: Insufficient documentation

## 2015-08-07 DIAGNOSIS — Z86718 Personal history of other venous thrombosis and embolism: Secondary | ICD-10-CM | POA: Insufficient documentation

## 2015-08-07 DIAGNOSIS — N189 Chronic kidney disease, unspecified: Secondary | ICD-10-CM | POA: Diagnosis not present

## 2015-08-07 DIAGNOSIS — M1990 Unspecified osteoarthritis, unspecified site: Secondary | ICD-10-CM | POA: Diagnosis not present

## 2015-08-07 DIAGNOSIS — D649 Anemia, unspecified: Secondary | ICD-10-CM | POA: Diagnosis not present

## 2015-08-07 DIAGNOSIS — R600 Localized edema: Secondary | ICD-10-CM | POA: Diagnosis not present

## 2015-08-07 DIAGNOSIS — I129 Hypertensive chronic kidney disease with stage 1 through stage 4 chronic kidney disease, or unspecified chronic kidney disease: Secondary | ICD-10-CM | POA: Insufficient documentation

## 2015-08-07 DIAGNOSIS — K219 Gastro-esophageal reflux disease without esophagitis: Secondary | ICD-10-CM | POA: Diagnosis not present

## 2015-08-07 DIAGNOSIS — I25119 Atherosclerotic heart disease of native coronary artery with unspecified angina pectoris: Secondary | ICD-10-CM | POA: Insufficient documentation

## 2015-08-07 DIAGNOSIS — H9190 Unspecified hearing loss, unspecified ear: Secondary | ICD-10-CM | POA: Diagnosis not present

## 2015-08-07 DIAGNOSIS — Z951 Presence of aortocoronary bypass graft: Secondary | ICD-10-CM | POA: Diagnosis not present

## 2015-08-07 DIAGNOSIS — R609 Edema, unspecified: Secondary | ICD-10-CM

## 2015-08-07 DIAGNOSIS — Z79899 Other long term (current) drug therapy: Secondary | ICD-10-CM | POA: Insufficient documentation

## 2015-08-07 DIAGNOSIS — R2243 Localized swelling, mass and lump, lower limb, bilateral: Secondary | ICD-10-CM | POA: Diagnosis present

## 2015-08-07 LAB — BASIC METABOLIC PANEL
ANION GAP: 11 (ref 5–15)
BUN: 67 mg/dL — ABNORMAL HIGH (ref 6–20)
CHLORIDE: 107 mmol/L (ref 101–111)
CO2: 19 mmol/L — AB (ref 22–32)
CREATININE: 2.78 mg/dL — AB (ref 0.61–1.24)
Calcium: 7.9 mg/dL — ABNORMAL LOW (ref 8.9–10.3)
GFR calc non Af Amer: 20 mL/min — ABNORMAL LOW (ref >60)
GFR, EST AFRICAN AMERICAN: 23 mL/min — AB (ref >60)
Glucose, Bld: 174 mg/dL — ABNORMAL HIGH (ref 65–99)
POTASSIUM: 4.2 mmol/L (ref 3.5–5.1)
SODIUM: 137 mmol/L (ref 135–145)

## 2015-08-07 LAB — CBC WITH DIFFERENTIAL/PLATELET
BASOS ABS: 0 10*3/uL (ref 0.0–0.1)
Basophils Relative: 1 %
EOS ABS: 0.1 10*3/uL (ref 0.0–0.7)
Eosinophils Relative: 2 %
HEMATOCRIT: 31 % — AB (ref 39.0–52.0)
HEMOGLOBIN: 9.5 g/dL — AB (ref 13.0–17.0)
LYMPHS PCT: 22 %
Lymphs Abs: 0.9 10*3/uL (ref 0.7–4.0)
MCH: 30 pg (ref 26.0–34.0)
MCHC: 30.6 g/dL (ref 30.0–36.0)
MCV: 97.8 fL (ref 78.0–100.0)
MONOS PCT: 5 %
Monocytes Absolute: 0.2 10*3/uL (ref 0.1–1.0)
NEUTROS ABS: 3.1 10*3/uL (ref 1.7–7.7)
NEUTROS PCT: 70 %
Platelets: 213 10*3/uL (ref 150–400)
RBC: 3.17 MIL/uL — ABNORMAL LOW (ref 4.22–5.81)
RDW: 15.4 % (ref 11.5–15.5)
WBC: 4.3 10*3/uL (ref 4.0–10.5)

## 2015-08-07 NOTE — Discharge Instructions (Signed)

## 2015-08-07 NOTE — ED Notes (Signed)
Dry dressings applied to both legs with re enforcement to leaking areas.

## 2015-08-07 NOTE — ED Notes (Signed)
Pt states for about a week, swelling in legs with weeping clear fluid, spoke with doctor this am and was told to come to ED. He has been taking extra Lasix to reduce swelling

## 2015-08-07 NOTE — ED Provider Notes (Signed)
CSN: [id number]     Arrival date & time 08/07/15  1037 History   First MD Initiated Contact with Patient 08/07/15 1134     Chief Complaint  Patient presents with  . edema in legs     seeping fluid from legs    HPI Pt noticed swelling in his legs for the last three weeks.  In the last several days they started leaking clear fluid.  He called his doctor and was told to come to the ED.  He has been taking his BP and diuretic meds.  He denies any trouble with fever.  No chest pain or shortness of breath. Past Medical History  Diagnosis Date  . Chronic kidney disease   . Thrombocythemia ([place])   . Esophageal stricture   . Anemia   . Anemia due to chronic renal failure treated with erythropoietin 03/24/2011  . Retinal vein thrombosis, left   . Coronary artery disease   . Hypertension   . GERD (gastroesophageal reflux disease)   . Arthritis   . Anginal pain ([place]) 1990  . Erosive esophagitis     grade 4  . Diverticulosis   . Internal hemorrhoids   . HOH (hard of hearing)   . Blood transfusion without reported diagnosis    Past Surgical History  Procedure Laterality Date  . Replacement total knee Right 04/19/08  . Av fistula placement Left 12/01/2013    Procedure: RADIOCEPHALIC ARTERIOVENOUS (AV) FISTULA CREATION;  Surgeon: Elam Dutch, MD;  Location: [facility];  Service: Vascular;  Laterality: Left;  . Coronary artery bypass graft  2008    x 5  . Eye surgery Left yrs ago    vitrectomy  . Eye brow lift Right     01-11-2014  . Inguinal hernia repair Left 05/08/01  . Colonoscopy N/A 02/02/2014    Procedure: COLONOSCOPY;  Surgeon: Inda Castle, MD;  Location: [facility];  Service: Endoscopy;  Laterality: N/A;  . Esophagogastroduodenoscopy N/A 02/02/2014    Procedure: ESOPHAGOGASTRODUODENOSCOPY (EGD);  Surgeon: Inda Castle, MD;  Location: [facility];  Service: Endoscopy;  Laterality: N/A;  . Ligation of arteriovenous  fistula Left 07/06/2014    Procedure: LIGATION OF ARTERIOVENOUS   FISTULA;  Surgeon: Elam Dutch, MD;  Location: [facility];  Service: Vascular;  Laterality: Left;  . Joint replacement    . Vasectomy     Family History  Problem Relation Age of Onset  . Dementia Mother   . Arthritis Mother   . Angina Father   . Heart attack Neg Hx    Social History  Substance Use Topics  . Smoking status: Former Smoker -- 2.00 packs/day for 30 years    Types: Cigarettes    Quit date: 11/27/1978  . Smokeless tobacco: Former Systems developer    Types: [place] date: 11/27/1978     Comment: quit 30 +yrs ago  . Alcohol Use: No     Comment: Quit 30 + yrs ago    Review of Systems  All other systems reviewed and are negative.     Allergies  Isosorbide and Norvasc  Home Medications   Prior to Admission medications   Medication Sig Start Date End Date Taking? Authorizing Provider  Acetaminophen-Codeine 300-15 MG TABS Take one every 6-8 hours only if needed for moderate pain 02/09/15  Yes Gildardo Cranker, DO  acyclovir (ZOVIRAX) 200 MG capsule Take 200 mg by mouth daily as needed (for outbreaks).  09/28/14  Yes Historical Provider, MD  amoxicillin (  AMOXIL) 500 MG capsule Take 500 mg by mouth 4 (four) times daily. For 8 days for pain irritation on day 7 of treatment 08/01/15  Yes Historical Provider, MD  anagrelide (AGRYLIN) 1 MG capsule TAKE ONE CAPSULE BY MOUTH ONCE DAILY OR AS DIRECTED BY PHYSICIAN. Patient taking differently: Take 1 mg by mouth daily. OR AS DIRECTED BY PHYSICIAN. 03/09/15  Yes Ladell Pier, MD  chlorhexidine (PERIDEX) 0.12 % solution RINSE WITH 5 MLS BY MOUTH EVERY DAY (DO NOT SWALLOW) 05/02/15  Yes Historical Provider, MD  cloNIDine (CATAPRES) 0.3 MG tablet Take 0.3 mg by mouth 3 (three) times daily.  09/28/14  Yes Historical Provider, MD  ferrous sulfate 325 (65 FE) MG tablet Take 1 tablet (325 mg total) by mouth 3 (three) times daily with meals. 07/15/15  Yes Manus Gunning, MD  fluocinonide cream (LIDEX) [id number] % Apply 1 application topically  daily as needed (for skin irritation).  06/14/15  Yes Historical Provider, MD  furosemide (LASIX) 40 MG tablet Take 40 mg by mouth 2 (two) times daily.    Yes Historical Provider, MD  ketoconazole (NIZORAL) 2 % cream apply to affected area twice a day UP TO 3 WEEKS AS NEEDED for skin irritation 06/28/15  Yes Historical Provider, MD  Melatonin 3 MG TABS Take 1-2 tabs by mouth at bedtime to help sleep 02/09/15  Yes Gildardo Cranker, DO  metoprolol succinate (TOPROL-XL) 50 MG 24 hr tablet Take 1 tablet (50 mg total) by mouth 2 (two) times daily. Take with or immediately following a meal. 04/06/15  Yes Erlene Quan, PA-C  Multiple Vitamins-Minerals (MULTIVITAMIN ADULT) TABS Take 1 tablet by mouth daily.   Yes Historical Provider, MD  pantoprazole (PROTONIX) 40 MG tablet Take 1 tablet (40 mg total) by mouth 2 (two) times daily. 04/04/15  Yes Manus Gunning, MD  PRESCRIPTION MEDICATION Supportive Therapy - [place]   Yes Historical Provider, MD  Tamsulosin HCl (FLOMAX) 0.4 MG CAPS Take 0.4 mg by mouth daily after supper.    Yes Historical Provider, MD  triamcinolone cream (KENALOG) 0.1 % Apply 1 application topically 2 (two) times daily as needed (for skin irritation).  06/01/15  Yes Historical Provider, MD  pantoprazole (PROTONIX) 40 MG tablet take 1 tablet by mouth twice a day Patient not taking: Reported on 08/07/2015 06/27/15   Manus Gunning, MD   BP 187/140 mmHg  Pulse 58  Temp(Src) 98.9 F (37.2 C) (Oral)  Resp 18  SpO2 93% Physical Exam  Constitutional: He appears well-nourished. No distress.  elderly  HENT:  Head: Normocephalic and atraumatic.  Right Ear: External ear normal.  Left Ear: External ear normal.  Port wine stain right face  Eyes: Conjunctivae are normal. Right eye exhibits no discharge. Left eye exhibits no discharge. No scleral icterus.  Neck: Neck supple. No tracheal deviation present.  Cardiovascular: Normal rate, regular rhythm and intact distal pulses.    Pulmonary/Chest: Effort normal and breath sounds normal. No stridor. No respiratory distress. He has no wheezes. He has no rales.  Abdominal: Soft. Bowel sounds are normal. He exhibits no distension. There is no tenderness. There is no rebound and no guarding.  Musculoskeletal: He exhibits edema. He exhibits no tenderness.  Edema bilaterally with pitting up to knee, no increased warmth, very mild erythema  Neurological: He is alert. He has normal strength. No cranial nerve deficit (no facial droop, extraocular movements intact, no slurred speech) or sensory deficit. He exhibits normal muscle tone. He displays no seizure activity.  Coordination normal.  Skin: Skin is warm and dry. No rash noted.  Psychiatric: He has a normal mood and affect.  Nursing note and vitals reviewed.   ED Course  Procedures (including critical care time) Labs Review Labs Reviewed  CBC WITH DIFFERENTIAL/PLATELET - Abnormal; Notable for the following:    RBC 3.17 (*)    Hemoglobin 9.5 (*)    HCT 31.0 (*)    All other components within normal limits  BASIC METABOLIC PANEL - Abnormal; Notable for the following:    CO2 19 (*)    Glucose, Bld 174 (*)    BUN 67 (*)    Creatinine, Ser 2.78 (*)    Calcium 7.9 (*)    GFR calc non Af Amer 20 (*)    GFR calc Af Amer 23 (*)    All other components within normal limits      MDM   Final diagnoses:  Peripheral edema    The patient's exam is not suggestive of a cellulitis. His laboratory tests also showing normal white blood cell count.  I suspect his symptoms are related to his chronic kidney disease and peripheral edema.  I do not want to add any additional Lasix considering his renal deficiency at this point. I think compressive dressings will be helpful. We will wrap The patient's legs and a gauze and then [place].  Follow up with his primary doctor. Consider daily dressing changes    Dorie Rank, MD 08/07/15 1426

## 2015-08-07 NOTE — ED Notes (Signed)
Pt demanding to leave, unable to obtain VS, pt dressed and assisted out in [facility]

## 2015-08-12 ENCOUNTER — Other Ambulatory Visit: Payer: Self-pay | Admitting: Nephrology

## 2015-08-12 DIAGNOSIS — N183 Chronic kidney disease, stage 3 (moderate): Secondary | ICD-10-CM

## 2015-08-15 ENCOUNTER — Ambulatory Visit: Payer: Medicare Other

## 2015-08-15 ENCOUNTER — Telehealth: Payer: Self-pay | Admitting: *Deleted

## 2015-08-15 ENCOUNTER — Other Ambulatory Visit: Payer: Medicare Other

## 2015-08-15 NOTE — Telephone Encounter (Signed)
Received call from switchboard.  She states that she had been talking to [name]'s wife, she stated that the patient was sick and would not be able ti come for appointment today.  He will call back and reschedule when he feels better.

## 2015-08-16 ENCOUNTER — Ambulatory Visit
Admission: RE | Admit: 2015-08-16 | Discharge: 2015-08-16 | Disposition: A | Discharge status: Home / Self Care | Payer: Medicare Other | Source: Ambulatory Visit | Attending: Nephrology | Admitting: Nephrology

## 2015-08-16 DIAGNOSIS — N183 Chronic kidney disease, stage 3 (moderate): Secondary | ICD-10-CM

## 2015-08-29 ENCOUNTER — Other Ambulatory Visit (HOSPITAL_BASED_OUTPATIENT_CLINIC_OR_DEPARTMENT_OTHER): Payer: Medicare Other

## 2015-08-29 ENCOUNTER — Ambulatory Visit (HOSPITAL_BASED_OUTPATIENT_CLINIC_OR_DEPARTMENT_OTHER): Payer: Medicare Other

## 2015-08-29 VITALS — BP 149/56 | HR 63 | Temp 97.8°F

## 2015-08-29 DIAGNOSIS — D473 Essential (hemorrhagic) thrombocythemia: Secondary | ICD-10-CM | POA: Diagnosis present

## 2015-08-29 DIAGNOSIS — N189 Chronic kidney disease, unspecified: Principal | ICD-10-CM

## 2015-08-29 DIAGNOSIS — D649 Anemia, unspecified: Secondary | ICD-10-CM

## 2015-08-29 DIAGNOSIS — D631 Anemia in chronic kidney disease: Secondary | ICD-10-CM

## 2015-08-29 LAB — CBC WITH DIFFERENTIAL/PLATELET
BASO%: 1.2 % (ref 0.0–2.0)
BASOS ABS: 0.1 10*3/uL (ref 0.0–0.1)
EOS%: 3.1 % (ref 0.0–7.0)
Eosinophils Absolute: 0.2 10*3/uL (ref 0.0–0.5)
HEMATOCRIT: 26.9 % — AB (ref 38.4–49.9)
HGB: 8.4 g/dL — ABNORMAL LOW (ref 13.0–17.1)
LYMPH#: 0.9 10*3/uL (ref 0.9–3.3)
LYMPH%: 18.4 % (ref 14.0–49.0)
MCH: 28.7 pg (ref 27.2–33.4)
MCHC: 31.4 g/dL — AB (ref 32.0–36.0)
MCV: 91.4 fL (ref 79.3–98.0)
MONO#: 0.4 10*3/uL (ref 0.1–0.9)
MONO%: 7.2 % (ref 0.0–14.0)
NEUT#: 3.4 10*3/uL (ref 1.5–6.5)
NEUT%: 70.1 % (ref 39.0–75.0)
PLATELETS: 198 10*3/uL (ref 140–400)
RBC: 2.95 10*6/uL — ABNORMAL LOW (ref 4.20–5.82)
RDW: 15 % — ABNORMAL HIGH (ref 11.0–14.6)
WBC: 4.9 10*3/uL (ref 4.0–10.3)

## 2015-08-29 MED ORDER — DARBEPOETIN ALFA 200 MCG/0.4ML IJ SOSY
200.0000 ug | PREFILLED_SYRINGE | Freq: Once | INTRAMUSCULAR | Status: AC
  Administered 2015-08-29: 200 ug via SUBCUTANEOUS
  Filled 2015-08-29: qty 0.4

## 2015-08-30 LAB — IRON AND TIBC
%SAT: 24 % (ref 20–55)
IRON: 39 ug/dL — AB (ref 42–163)
TIBC: 164 ug/dL — AB (ref 202–409)
UIBC: 125 ug/dL (ref 117–376)

## 2015-08-30 LAB — FERRITIN: FERRITIN: 238 ng/mL (ref 22–316)

## 2015-09-02 ENCOUNTER — Emergency Department (HOSPITAL_COMMUNITY): Payer: Medicare Other

## 2015-09-02 ENCOUNTER — Emergency Department (HOSPITAL_COMMUNITY)
Admission: EM | Admit: 2015-09-02 | Discharge: 2015-09-02 | Disposition: A | Discharge status: Home / Self Care | Payer: Medicare Other | Attending: Emergency Medicine | Admitting: Emergency Medicine

## 2015-09-02 ENCOUNTER — Encounter (HOSPITAL_COMMUNITY): Payer: Self-pay

## 2015-09-02 DIAGNOSIS — K219 Gastro-esophageal reflux disease without esophagitis: Secondary | ICD-10-CM | POA: Insufficient documentation

## 2015-09-02 DIAGNOSIS — R630 Anorexia: Secondary | ICD-10-CM | POA: Insufficient documentation

## 2015-09-02 DIAGNOSIS — R1110 Vomiting, unspecified: Secondary | ICD-10-CM | POA: Insufficient documentation

## 2015-09-02 DIAGNOSIS — Z87891 Personal history of nicotine dependence: Secondary | ICD-10-CM | POA: Insufficient documentation

## 2015-09-02 DIAGNOSIS — H9190 Unspecified hearing loss, unspecified ear: Secondary | ICD-10-CM | POA: Insufficient documentation

## 2015-09-02 DIAGNOSIS — J029 Acute pharyngitis, unspecified: Secondary | ICD-10-CM | POA: Diagnosis not present

## 2015-09-02 DIAGNOSIS — R112 Nausea with vomiting, unspecified: Secondary | ICD-10-CM | POA: Insufficient documentation

## 2015-09-02 DIAGNOSIS — M7981 Nontraumatic hematoma of soft tissue: Secondary | ICD-10-CM | POA: Insufficient documentation

## 2015-09-02 DIAGNOSIS — K921 Melena: Secondary | ICD-10-CM | POA: Diagnosis not present

## 2015-09-02 DIAGNOSIS — Z951 Presence of aortocoronary bypass graft: Secondary | ICD-10-CM | POA: Diagnosis not present

## 2015-09-02 DIAGNOSIS — N189 Chronic kidney disease, unspecified: Secondary | ICD-10-CM | POA: Diagnosis not present

## 2015-09-02 DIAGNOSIS — I25119 Atherosclerotic heart disease of native coronary artery with unspecified angina pectoris: Secondary | ICD-10-CM | POA: Insufficient documentation

## 2015-09-02 DIAGNOSIS — D631 Anemia in chronic kidney disease: Secondary | ICD-10-CM | POA: Diagnosis not present

## 2015-09-02 DIAGNOSIS — Z7901 Long term (current) use of anticoagulants: Secondary | ICD-10-CM | POA: Insufficient documentation

## 2015-09-02 DIAGNOSIS — R600 Localized edema: Secondary | ICD-10-CM | POA: Diagnosis not present

## 2015-09-02 DIAGNOSIS — M1990 Unspecified osteoarthritis, unspecified site: Secondary | ICD-10-CM | POA: Diagnosis not present

## 2015-09-02 DIAGNOSIS — I129 Hypertensive chronic kidney disease with stage 1 through stage 4 chronic kidney disease, or unspecified chronic kidney disease: Secondary | ICD-10-CM | POA: Diagnosis not present

## 2015-09-02 DIAGNOSIS — Z79899 Other long term (current) drug therapy: Secondary | ICD-10-CM | POA: Insufficient documentation

## 2015-09-02 DIAGNOSIS — W19XXXA Unspecified fall, initial encounter: Secondary | ICD-10-CM

## 2015-09-02 DIAGNOSIS — D649 Anemia, unspecified: Secondary | ICD-10-CM

## 2015-09-02 LAB — CBC WITH DIFFERENTIAL/PLATELET
BASOS PCT: 0 %
Basophils Absolute: 0 10*3/uL (ref 0.0–0.1)
EOS ABS: 0 10*3/uL (ref 0.0–0.7)
EOS PCT: 0 %
HCT: 25.8 % — ABNORMAL LOW (ref 39.0–52.0)
HEMOGLOBIN: 7.8 g/dL — AB (ref 13.0–17.0)
Lymphocytes Relative: 14 %
Lymphs Abs: 0.6 10*3/uL — ABNORMAL LOW (ref 0.7–4.0)
MCH: 27.7 pg (ref 26.0–34.0)
MCHC: 30.2 g/dL (ref 30.0–36.0)
MCV: 91.5 fL (ref 78.0–100.0)
MONOS PCT: 7 %
Monocytes Absolute: 0.3 10*3/uL (ref 0.1–1.0)
NEUTROS PCT: 79 %
Neutro Abs: 3.7 10*3/uL (ref 1.7–7.7)
PLATELETS: 133 10*3/uL — AB (ref 150–400)
RBC: 2.82 MIL/uL — ABNORMAL LOW (ref 4.22–5.81)
RDW: 14.7 % (ref 11.5–15.5)
WBC: 4.6 10*3/uL (ref 4.0–10.5)

## 2015-09-02 LAB — COMPREHENSIVE METABOLIC PANEL
ALK PHOS: 74 U/L (ref 38–126)
ALT: 8 U/L — AB (ref 17–63)
AST: 12 U/L — ABNORMAL LOW (ref 15–41)
Albumin: 1.8 g/dL — ABNORMAL LOW (ref 3.5–5.0)
Anion gap: 8 (ref 5–15)
BUN: 42 mg/dL — ABNORMAL HIGH (ref 6–20)
CALCIUM: 6.6 mg/dL — AB (ref 8.9–10.3)
CO2: 15 mmol/L — AB (ref 22–32)
CREATININE: 1.64 mg/dL — AB (ref 0.61–1.24)
Chloride: 121 mmol/L — ABNORMAL HIGH (ref 101–111)
GFR calc Af Amer: 44 mL/min — ABNORMAL LOW (ref >60)
GFR, EST NON AFRICAN AMERICAN: 38 mL/min — AB (ref >60)
Glucose, Bld: 103 mg/dL — ABNORMAL HIGH (ref 65–99)
Potassium: 3.1 mmol/L — ABNORMAL LOW (ref 3.5–5.1)
Sodium: 144 mmol/L (ref 135–145)
Total Bilirubin: 0.6 mg/dL (ref 0.3–1.2)
Total Protein: 4 g/dL — ABNORMAL LOW (ref 6.5–8.1)

## 2015-09-02 LAB — I-STAT CG4 LACTIC ACID, ED: LACTIC ACID, VENOUS: 1.26 mmol/L (ref 0.5–2.0)

## 2015-09-02 LAB — POC OCCULT BLOOD, ED: FECAL OCCULT BLD: POSITIVE — AB

## 2015-09-02 MED ORDER — SODIUM CHLORIDE 0.9 % IV BOLUS (SEPSIS)
1000.0000 mL | Freq: Once | INTRAVENOUS | Status: AC
  Administered 2015-09-02: 1000 mL via INTRAVENOUS

## 2015-09-02 MED ORDER — ONDANSETRON HCL 4 MG PO TABS: 4.0000 mg | ORAL_TABLET | Freq: Four times a day (QID) | ORAL | Status: AC

## 2015-09-02 MED ORDER — CLONIDINE HCL 0.1 MG PO TABS
0.3000 mg | ORAL_TABLET | Freq: Once | ORAL | Status: AC
  Administered 2015-09-02: 0.3 mg via ORAL
  Filled 2015-09-02: qty 1

## 2015-09-02 NOTE — ED Provider Notes (Signed)
CSN: [id number]     Arrival date & time 09/02/15  1807 History   First MD Initiated Contact with Patient 09/02/15 1831     Chief Complaint  Patient presents with  . Weakness  . Emesis  . Nausea     (Consider location/radiation/quality/duration/timing/severity/associated sxs/prior Treatment) HPI Comments: 82 year old male who presents with weakness for the past 3 days. He states he has had nausea and several episodes of vomiting since Tuesday. Reports associated decreased appetite, sore throat, and diarrhea. He has received his flu vaccination this year. Denies fever, chills, chest pain, reflux, SOB, abdominal pain, dysuria. He has CAD with CABG x 5 in 2008, CKD, HTN, chronic back and L knee pain. He was brought in by EMS today who gave him Zofran 4mg  IV which has provided some relief of nausea. He has not eaten for 2 days. He has also had multiple falls.  Patient is a 82 y.o. male presenting with weakness and vomiting.  Weakness Associated symptoms include arthralgias, nausea, a sore throat and vomiting. Pertinent negatives include no abdominal pain, chest pain, chills, congestion, fever or weakness.  Emesis Associated symptoms: arthralgias, diarrhea and sore throat   Associated symptoms: no abdominal pain and no chills     Past Medical History  Diagnosis Date  . Chronic kidney disease   . Thrombocythemia ([place])   . Esophageal stricture   . Anemia   . Anemia due to chronic renal failure treated with erythropoietin 03/24/2011  . Retinal vein thrombosis, left   . Coronary artery disease   . Hypertension   . GERD (gastroesophageal reflux disease)   . Arthritis   . Anginal pain ([place]) 1990  . Erosive esophagitis     grade 4  . Diverticulosis   . Internal hemorrhoids   . HOH (hard of hearing)   . Blood transfusion without reported diagnosis    Past Surgical History  Procedure Laterality Date  . Replacement total knee Right 04/19/08  . Av fistula placement Left 12/01/2013     Procedure: RADIOCEPHALIC ARTERIOVENOUS (AV) FISTULA CREATION;  Surgeon: Elam Dutch, MD;  Location: [facility];  Service: Vascular;  Laterality: Left;  . Coronary artery bypass graft  2008    x 5  . Eye surgery Left yrs ago    vitrectomy  . Eye brow lift Right     01-11-2014  . Inguinal hernia repair Left 05/08/01  . Colonoscopy N/A 02/02/2014    Procedure: COLONOSCOPY;  Surgeon: Inda Castle, MD;  [facility];  Service: Endoscopy;  Laterality: N/A;  . Esophagogastroduodenoscopy N/A 02/02/2014    Procedure: ESOPHAGOGASTRODUODENOSCOPY (EGD);  Surgeon: Inda Castle, MD;  Location: [facility];  Service: Endoscopy;  Laterality: N/A;  . Ligation of arteriovenous  fistula Left 07/06/2014    Procedure: LIGATION OF ARTERIOVENOUS  FISTULA;  Surgeon: Elam Dutch, MD;  Location: [facility];  Service: Vascular;  Laterality: Left;  . Joint replacement    . Vasectomy     Family History  Problem Relation Age of Onset  . Dementia Mother   . Arthritis Mother   . Angina Father   . Heart attack Neg Hx    Social History  Substance Use Topics  . Smoking status: Former Smoker -- 2.00 packs/day for 30 years    Types: Cigarettes    Quit date: 11/27/1978  . Smokeless tobacco: Former Systems developer    Types: [place] date: 11/27/1978     Comment: quit 30 +yrs ago  .  Alcohol Use: No     Comment: Quit 30 + yrs ago    Review of Systems  Constitutional: Negative for fever and chills.  HENT: Positive for sore throat. Negative for congestion, ear pain and rhinorrhea.   Eyes: Negative for visual disturbance.  Respiratory: Negative for chest tightness and shortness of breath.   Cardiovascular: Negative for chest pain.  Gastrointestinal: Positive for nausea, vomiting and diarrhea. Negative for abdominal pain.  Genitourinary: Negative for dysuria.  Musculoskeletal: Positive for back pain and arthralgias.  Neurological: Negative for syncope, speech difficulty, weakness and light-headedness.   Hematological: Bruises/bleeds easily.    Allergies  Isosorbide nitrate and Norvasc  Home Medications   Prior to Admission medications   Medication Sig Start Date End Date Taking? Authorizing Provider  Acetaminophen-Codeine 300-15 MG TABS Take one every 6-8 hours only if needed for moderate pain 02/09/15   Gildardo Cranker, DO  acyclovir (ZOVIRAX) 200 MG capsule Take 200 mg by mouth daily as needed (for outbreaks).  09/28/14   Historical Provider, MD  amoxicillin (AMOXIL) 500 MG capsule Take 500 mg by mouth 4 (four) times daily. For 8 days for pain irritation on day 7 of treatment 08/01/15   Historical Provider, MD  anagrelide (AGRYLIN) 1 MG capsule TAKE ONE CAPSULE BY MOUTH ONCE DAILY OR AS DIRECTED BY PHYSICIAN. Patient taking differently: Take 1 mg by mouth daily. OR AS DIRECTED BY PHYSICIAN. 03/09/15   Ladell Pier, MD  chlorhexidine (PERIDEX) 0.12 % solution RINSE WITH 5 MLS BY MOUTH EVERY DAY (DO NOT SWALLOW) 05/02/15   Historical Provider, MD  cloNIDine (CATAPRES) 0.3 MG tablet Take 0.3 mg by mouth 3 (three) times daily.  09/28/14   Historical Provider, MD  ferrous sulfate 325 (65 FE) MG tablet Take 1 tablet (325 mg total) by mouth 3 (three) times daily with meals. 07/15/15   Manus Gunning, MD  fluocinonide cream (LIDEX) [id number] % Apply 1 application topically daily as needed (for skin irritation).  06/14/15   Historical Provider, MD  furosemide (LASIX) 40 MG tablet Take 40 mg by mouth 2 (two) times daily.     Historical Provider, MD  ketoconazole (NIZORAL) 2 % cream apply to affected area twice a day UP TO 3 WEEKS AS NEEDED for skin irritation 06/28/15   Historical Provider, MD  Melatonin 3 MG TABS Take 1-2 tabs by mouth at bedtime to help sleep 02/09/15   Gildardo Cranker, DO  metoprolol succinate (TOPROL-XL) 50 MG 24 hr tablet Take 1 tablet (50 mg total) by mouth 2 (two) times daily. Take with or immediately following a meal. 04/06/15   Erlene Quan, PA-C  Multiple Vitamins-Minerals  (MULTIVITAMIN ADULT) TABS Take 1 tablet by mouth daily.    Historical Provider, MD  pantoprazole (PROTONIX) 40 MG tablet Take 1 tablet (40 mg total) by mouth 2 (two) times daily. 04/04/15   Manus Gunning, MD  pantoprazole (PROTONIX) 40 MG tablet take 1 tablet by mouth twice a day Patient not taking: Reported on 08/07/2015 06/27/15   Manus Gunning, MD  [place]    Historical Provider, MD  Tamsulosin HCl (FLOMAX) 0.4 MG CAPS Take 0.4 mg by mouth daily after supper.     Historical Provider, MD  triamcinolone cream (KENALOG) 0.1 % Apply 1 application topically 2 (two) times daily as needed (for skin irritation).  06/01/15   Historical Provider, MD   There were no vitals taken for this visit.   Physical Exam  Constitutional: He  is oriented to person, place, and time. No distress.  [place]tic  HENT:  Head: Normocephalic and atraumatic.  Mouth/Throat: Uvula is midline, oropharynx is clear and moist and mucous membranes are normal.  Hard of hearing  Eyes: Conjunctivae are normal. Pupils are equal, round, and reactive to light.  L eye is closed more than right  Neck: No JVD present.  Cardiovascular: Normal rate, regular rhythm, S1 normal and S2 normal.  PMI is not displaced.  Exam reveals no gallop and no friction rub.   No murmur heard. Pulses:      Radial pulses are 2+ on the right side, and 2+ on the left side.  Pulmonary/Chest: Effort normal. No respiratory distress. He has no wheezes. He has no rales. He exhibits no tenderness.  Abdominal: Soft. Bowel sounds are normal. He exhibits no distension and no mass. There is no tenderness. There is no rebound and no guarding.  Genitourinary: Guaiac positive stool.  Rectal exam: Black stool noted on anus. Digital exam was negative without mass, lesions or tenderness   Musculoskeletal: He exhibits edema.  Bilateral lower extremity pitting edema. Gait: Walks with walker. Slow, shuffling, unsteady  gait  Neurological: He is alert and oriented to person, place, and time.  Skin: Skin is warm and dry.  Ecchymosis over back  Psychiatric: He has a normal mood and affect.    ED Course  Procedures (including critical care time) Labs Review Labs Reviewed  COMPREHENSIVE METABOLIC PANEL - Abnormal; Notable for the following:    Potassium 3.1 (*)    Chloride 121 (*)    CO2 15 (*)    Glucose, Bld 103 (*)    BUN 42 (*)    Creatinine, Ser 1.64 (*)    Calcium 6.6 (*)    Total Protein 4.0 (*)    Albumin 1.8 (*)    AST 12 (*)    ALT 8 (*)    GFR calc non Af Amer 38 (*)    GFR calc Af Amer 44 (*)    All other components within normal limits  CBC WITH DIFFERENTIAL/PLATELET - Abnormal; Notable for the following:    RBC 2.82 (*)    Hemoglobin 7.8 (*)    HCT 25.8 (*)    Platelets 133 (*)    Lymphs Abs 0.6 (*)    All other components within normal limits  POC OCCULT BLOOD, ED - Abnormal; Notable for the following:    Fecal Occult Bld POSITIVE (*)    All other components within normal limits  I-STAT CG4 LACTIC ACID, ED    Imaging Review Dg Ribs Bilateral W/chest  09/02/2015  CLINICAL DATA:  RIGHT rib pain post fall, has fallen 3 times in past month, hypertension, former smoker EXAM: BILATERAL RIBS AND CHEST - 4+ VIEW COMPARISON:  10/17/2014 FINDINGS: Enlargement of cardiac silhouette post CABG. Calcified tortuous aorta. Sub pulmonary vascular congestion. Bibasilar atelectasis. Central peribronchial thickening, chronic. No definite acute infiltrate, pleural effusion, or pneumothorax. Diffuse osseous demineralization. BBs placed at sites of symptoms lower RIGHT chest. Small focus of sclerosis at the lateral LEFT ninth rib at a site of healing fracture. No additional fracture or bone destruction. Degenerative changes and scoliosis of lumbar spine. IMPRESSION: Enlargement of cardiac silhouette post CABG. Bibasilar atelectasis and central bronchitic changes. Healing lateral LEFT ninth rib fracture.  No definite acute RIGHT rib abnormalities. Electronically Signed   By: Lavonia Dana M.D.   On: 09/02/2015 20:51   I have personally reviewed and evaluated these images and lab  results as part of my medical decision-making.   EKG Interpretation   Date/Time:  Friday September 02 2015 19:25:36 EDT Ventricular Rate:  87 PR Interval:  275 QRS Duration: 99 QT Interval:  383 QTC Calculation: 461 R Axis:   -73 Text Interpretation:  Sinus rhythm Prolonged PR interval Left anterior  fascicular block RSR' in V1 or V2, right VCD or RVH Probable left  ventricular hypertrophy Borderline T abnormalities, inferior leads since  last tracing no significant change Confirmed by MILLER  MD, BRIAN ([id number])  on 09/02/2015 7:30:26 PM      MDM   Final diagnoses:  Nausea and vomiting, vomiting of unspecified type  Anemia, unspecified anemia type  Melena   82 year old male who presents with weakness, multiple falls, nausea, and vomiting for the past 3 days. Labs revealed he is more anemic than usual. He states he regularly has blood work done every 2 weeks at [facility] and they give him blood transfusions if needed. Hemoccult was checked which came back positive. Pt informed. No complaints of feeling lightheaded or dizzy. Bilateral rib series obtained due to his bruising and multiple falls - no acute fx. Pt states these have been mechanical falls when he has lost his balance.  Pt given 1L NS here in ED as well as a dose of his home Clonidine which he had missed. Blood pressure was trending down at time of discharge. Pt was advised to stay overnight however refuses and will leave AMA.  We discussed the nature and purpose, risks and benefits, as well as, the alternatives of treatment. Time was given to allow the opportunity to ask questions and consider their options, and after the discussion, the patient decided to refuse the offerred treatment. The patient was informed that refusal could lead to, but was not  limited to, death, permanent disability, or severe pain. I asked his daughter to dissuade them without success. Prior to refusing, I determined that the patient had the capacity to make their decision and understood the consequences of that decision. After refusal, I made every reasonable opportunity to treat them to the best of my ability.  The patient was notified that they may return to the emergency department at any time for further treatment.       Recardo Evangelist, PA-C 09/03/15 1141  Noemi Chapel, MD 09/03/15 [phone]

## 2015-09-02 NOTE — ED Notes (Addendum)
Pt verbalized understanding of leaving AMA without further treatment and the risks associated. Discharged home with daughter.

## 2015-09-02 NOTE — ED Notes (Signed)
Patient transported to X-ray 

## 2015-09-02 NOTE — Discharge Instructions (Signed)
Your blood counts were found to be low today, but not to the point where you need a blood transfusion right now. Please follow up with your doctor as to why these counts may be so low.  We also have found that you have blood in your stool. Please tell your doctor so this can be further evaluated.

## 2015-09-02 NOTE — ED Provider Notes (Signed)
The patient is an 82 year old male, he currently lives with his wife and daughter, according to the patient after eating a cheeseburger 3 days ago he developed nausea and has had multiple episodes of vomiting. He reports being constipated so he took MiraLAX and has had several loose stools, he also reports that he has not been able to hold any fluid or food down in 3 days. His daughter reports that he had a witnessed fall when he lost his balance using his walker this morning, he does report generalized weakness and fatigue. He has had a couple of falls and in fact has fallen and struck his ribs on bilateral sides. He denies headache, chest pain, cough, shortness of breath and denies any abdominal pain swelling or rashes. He does endorse having some difficulty with urination which is a chronic problem. There is no fevers, no chills.  On exam the patient has a soft nontender abdomen, his chest reveals a median sternotomy scar which is well-healed, the patient has had a 5 vessel bypass in the past. He has bruising over his posterior thorax and tenderness over the bilateral posterior ribs though there is no crepitance or subcutaneous emphysema. There is no signs of injury about the head and his mucous membranes appear normal. He is able to straight leg raise but has some difficulty with his left lower extremity stating that he has chronic pain in the leg.  The etiology of the patient's nausea is unclear however several labs and imaging tests will be necessary to rule out further problems, EKG does not show any signs of ischemia, it is essentially unchanged, there is no ectopy, no arrhythmia, no ischemia.  The pt refuses to be admitted and wanted to leave [facility]   EKG Interpretation  Date/Time:  Friday September 02 2015 19:25:36 EDT Ventricular Rate:  87 PR Interval:  275 QRS Duration: 99 QT Interval:  383 QTC Calculation: 461 R Axis:   -73 Text Interpretation:  Sinus rhythm Prolonged PR interval Left anterior  fascicular block RSR' in V1 or V2, right VCD or RVH Probable left ventricular hypertrophy Borderline T abnormalities, inferior leads since last tracing no significant change Confirmed by Lleyton Byers  MD, Esther Bradstreet ([id number]) on 09/02/2015 7:30:26 PM      Medical screening examination/treatment/procedure(s) were conducted as a shared visit with non-physician practitioner(s) and myself.  I personally evaluated the patient during the encounter.  Clinical Impression:   Final diagnoses:  Nausea and vomiting, vomiting of unspecified type  Anemia, unspecified anemia type  Melena         Noemi Chapel, MD 09/03/15 1232

## 2015-09-02 NOTE — ED Notes (Signed)
[facility]- pt coming from home with generalized weakness. He also reports n/v after eating a cheeseburger on Tuesday. Pt is alert and oriented. Hypertensive with EMS 190/110. Pt received 4mg  of zofran with EMS.

## 2015-09-02 NOTE — ED Notes (Signed)
Pt ambulated with a walker without difficulty. Denies nausea, dizziness, shortness of breath.

## 2015-09-05 ENCOUNTER — Telehealth: Payer: Self-pay | Admitting: Oncology

## 2015-09-05 ENCOUNTER — Telehealth: Payer: Self-pay | Admitting: *Deleted

## 2015-09-05 NOTE — Telephone Encounter (Signed)
Pt's wife called, spoke with Thayer Headings, LPN: pt is not doing well, can't dress himself, feels weak. He was seen in ED on 3/31- HGB 7.8. Noted pt left ED AMA. Pt's wife requesting blood transfusion. Reviewed with Dr. Benay Spice: Pt will be worked in with APP this week.

## 2015-09-05 NOTE — Telephone Encounter (Signed)
Informed pt's wife of appointments for 4/4. She will try to arrange transportation. [name] reports pt has a dental appt at 1115 tomorrow. She doesn't think she should reschedule that visit even though she thinks pt is very weak.

## 2015-09-05 NOTE — Telephone Encounter (Signed)
s.w. pt and advised on 4.4 appts...ss for 10:30

## 2015-09-06 ENCOUNTER — Other Ambulatory Visit: Payer: Self-pay | Admitting: Nurse Practitioner

## 2015-09-06 ENCOUNTER — Other Ambulatory Visit (HOSPITAL_BASED_OUTPATIENT_CLINIC_OR_DEPARTMENT_OTHER): Payer: Medicare Other

## 2015-09-06 ENCOUNTER — Inpatient Hospital Stay (HOSPITAL_COMMUNITY): Admission: RE | Admit: 2015-09-06 | Payer: Medicare Other | Source: Ambulatory Visit

## 2015-09-06 ENCOUNTER — Ambulatory Visit (HOSPITAL_BASED_OUTPATIENT_CLINIC_OR_DEPARTMENT_OTHER): Payer: Medicare Other | Admitting: Nurse Practitioner

## 2015-09-06 VITALS — BP 171/66 | HR 63 | Temp 98.1°F | Resp 17 | Ht 68.0 in | Wt 153.1 lb

## 2015-09-06 DIAGNOSIS — D473 Essential (hemorrhagic) thrombocythemia: Secondary | ICD-10-CM | POA: Diagnosis present

## 2015-09-06 DIAGNOSIS — K219 Gastro-esophageal reflux disease without esophagitis: Secondary | ICD-10-CM

## 2015-09-06 DIAGNOSIS — R270 Ataxia, unspecified: Secondary | ICD-10-CM | POA: Diagnosis not present

## 2015-09-06 DIAGNOSIS — Z862 Personal history of diseases of the blood and blood-forming organs and certain disorders involving the immune mechanism: Secondary | ICD-10-CM

## 2015-09-06 DIAGNOSIS — I2510 Atherosclerotic heart disease of native coronary artery without angina pectoris: Secondary | ICD-10-CM | POA: Diagnosis not present

## 2015-09-06 DIAGNOSIS — D471 Chronic myeloproliferative disease: Secondary | ICD-10-CM | POA: Diagnosis not present

## 2015-09-06 DIAGNOSIS — K922 Gastrointestinal hemorrhage, unspecified: Secondary | ICD-10-CM

## 2015-09-06 DIAGNOSIS — N189 Chronic kidney disease, unspecified: Secondary | ICD-10-CM | POA: Diagnosis not present

## 2015-09-06 LAB — CBC WITH DIFFERENTIAL/PLATELET
BASO%: 0.8 % (ref 0.0–2.0)
BASOS ABS: 0 10*3/uL (ref 0.0–0.1)
EOS ABS: 0.1 10*3/uL (ref 0.0–0.5)
EOS%: 2.2 % (ref 0.0–7.0)
HCT: 28.5 % — ABNORMAL LOW (ref 38.4–49.9)
HGB: 8.9 g/dL — ABNORMAL LOW (ref 13.0–17.1)
LYMPH%: 12.2 % — AB (ref 14.0–49.0)
MCH: 28.9 pg (ref 27.2–33.4)
MCHC: 31.1 g/dL — AB (ref 32.0–36.0)
MCV: 92.7 fL (ref 79.3–98.0)
MONO#: 0.3 10*3/uL (ref 0.1–0.9)
MONO%: 7.6 % (ref 0.0–14.0)
NEUT#: 3.5 10*3/uL (ref 1.5–6.5)
NEUT%: 77.2 % — AB (ref 39.0–75.0)
PLATELETS: 174 10*3/uL (ref 140–400)
RBC: 3.07 10*6/uL — AB (ref 4.20–5.82)
RDW: 15.6 % — ABNORMAL HIGH (ref 11.0–14.6)
WBC: 4.6 10*3/uL (ref 4.0–10.3)
lymph#: 0.6 10*3/uL — ABNORMAL LOW (ref 0.9–3.3)

## 2015-09-06 LAB — COMPREHENSIVE METABOLIC PANEL
ALT: <9 U/L (ref 0–55)
ANION GAP: 9 meq/L (ref 3–11)
AST: 14 U/L (ref 5–34)
Albumin: 2.4 g/dL — ABNORMAL LOW (ref 3.5–5.0)
Alkaline Phosphatase: 112 U/L (ref 40–150)
BUN: 58.7 mg/dL — ABNORMAL HIGH (ref 7.0–26.0)
CALCIUM: 8 mg/dL — AB (ref 8.4–10.4)
CHLORIDE: 110 meq/L — AB (ref 98–109)
CO2: 18 meq/L — AB (ref 22–29)
Creatinine: 2.7 mg/dL — ABNORMAL HIGH (ref 0.7–1.3)
EGFR: 21 mL/min/{1.73_m2} — AB (ref >90)
Glucose: 127 mg/dl (ref 70–140)
Potassium: 4.4 mEq/L (ref 3.5–5.1)
Sodium: 138 mEq/L (ref 136–145)
Total Bilirubin: 0.32 mg/dL (ref 0.20–1.20)
Total Protein: 5 g/dL — ABNORMAL LOW (ref 6.4–8.3)

## 2015-09-06 NOTE — Progress Notes (Addendum)
[place] OFFICE PROGRESS NOTE   Diagnosis:  Essential thrombocytosis, anemia  INTERVAL HISTORY:   [name] returns prior to scheduled follow-up due to multiple recent falls. He continues anagrelide. Last Aranesp injection 08/29/2015.  He reports several episodes of nausea/vomiting/diarrhea last week. He was seen in the emergency department on 09/02/2015. Hemoglobin at that time was 7.8. Stool was positive for occult blood. He declined admission and left AGAINST MEDICAL ADVICE.  He continues to have falls. His daughter notes that he typically falls backward. He fell this morning and hit his head. He reports seeing [name] in the past due to "balance problems". It has been several years since he last saw [name]. The nausea/vomiting/diarrhea he was experiencing last week have resolved. He is feeling much better. Appetite is better this week. He denies any black stools. He notes some dizziness this morning. He attributes the dizziness to taking Ambien and Lasix last night.  Objective:  Vital signs in last 24 hours:  Blood pressure 171/66, pulse 63, temperature 98.1 F (36.7 C), temperature source Oral, resp. rate 17, height '5\' 8"'$  (1.727 m), weight 153 lb 1.6 oz (69.446 kg), SpO2 98 %.    HEENT: No thrush or ulcers. Resp: Lungs clear bilaterally. Cardio: Regular rate and rhythm. GI: Abdomen soft and nontender. No organomegaly. Vascular: No leg edema. Neuro: Alert and oriented. Hard of hearing. Motor strength 5 over 5. Follows commands. Finger to nose intact. Ataxic gait.  Skin: No scalp laceration.   Lab Results:  Lab Results  Component Value Date   WBC 4.6 09/06/2015   HGB 8.9* 09/06/2015   HCT 28.5* 09/06/2015   MCV 92.7 09/06/2015   PLT 174 09/06/2015   NEUTROABS 3.5 09/06/2015    Imaging:  No results found.  Medications: I have reviewed the patient's current medications.  Assessment/Plan: 1. Essential thrombocytosis, long-standing,  maintained on anagrelide  Bone marrow biopsy 11/10/2014 with a hypercellular marrow, increased atypical megakaryocytes, 46 XY karyotype, iron present, no increase in blasts 2. Severe anemia-likely secondary to renal insufficiency, chronic GI bleeding, and the myeloproliferative disorder 3. Chronic renal failure 4. History of AVMs confirmed on a capsule endoscopy April 2016 5. Gastroesophageal reflux disease 6. Coronary artery disease 7. Movement disorder 8. History of iron deficiency-maintained on iron    Disposition: [name] remains stable from a hematologic standpoint. Hemoglobin is at baseline on labs today. Plan to continue anagrelide and Aranesp as he is currently taking.  He may have had viral gastroenteritis last week. The nausea/vomiting/diarrhea have resolved. He is tolerating a regular diet.  His gait is ataxic. His daughter reports this is chronic for many years. He has seen [name] in the past. They will contact [name] to schedule a follow-up appointment. He uses a walker. I gave him a prescription for a lightweight folding wheelchair. I reinforced to the family that he should only be up with assistance.  Stool was positive for occult blood in the emergency department. He has a history of AVMs confirmed on capsule endoscopy April 2016. He continues oral iron.  He will return for labs and Aranesp on 09/12/2015. We will see him in follow-up as scheduled on 10/24/2015.  Patient seen with Dr. Benay Spice. 25 minutes were spent face-to-face at today's visit with the majority of that time involved in counseling/coordination of care.      Ned Card ANP/GNP-BC   09/06/2015  9:55 AM   this was a shared visit with Ned Card. [name] will continue  Aranesp /iron and angrelide. We recommended he see [name] to evaluate the balance difficulty.   Julieanne Manson, M.D.

## 2015-09-06 NOTE — Telephone Encounter (Signed)
Received call from [name]'s daughter, [name] who says that her dad has fallen again this morning and that he doesn't want to come in this morning.  He is thinking that he could at a later time.  They were told yesterday that this is the only appointment available for now.    This nurse called patient and strongly encouraged [name] to come to the appointment today.  He said that he would get here.  I let daughter know so she can make sure he gets here.

## 2015-09-12 ENCOUNTER — Other Ambulatory Visit: Payer: Medicare Other

## 2015-09-12 ENCOUNTER — Telehealth: Payer: Self-pay | Admitting: Gastroenterology

## 2015-09-12 ENCOUNTER — Ambulatory Visit: Payer: Medicare Other

## 2015-09-12 NOTE — Telephone Encounter (Signed)
The pt was advised that he was told to take iron supplements after the capsule.  He stated he is doing well but was just wondering what he was told to take.  He has been taking iron and will call back if he has any concerns

## 2015-09-12 NOTE — Telephone Encounter (Signed)
The phone did not have a voice mail. I will try the call at a later date.

## 2015-09-13 ENCOUNTER — Ambulatory Visit (HOSPITAL_BASED_OUTPATIENT_CLINIC_OR_DEPARTMENT_OTHER): Payer: Medicare Other

## 2015-09-13 ENCOUNTER — Telehealth: Payer: Self-pay | Admitting: Oncology

## 2015-09-13 ENCOUNTER — Other Ambulatory Visit (HOSPITAL_BASED_OUTPATIENT_CLINIC_OR_DEPARTMENT_OTHER): Payer: Medicare Other

## 2015-09-13 VITALS — BP 142/74 | HR 58 | Temp 98.2°F

## 2015-09-13 DIAGNOSIS — D631 Anemia in chronic kidney disease: Secondary | ICD-10-CM

## 2015-09-13 DIAGNOSIS — N189 Chronic kidney disease, unspecified: Secondary | ICD-10-CM

## 2015-09-13 DIAGNOSIS — D473 Essential (hemorrhagic) thrombocythemia: Secondary | ICD-10-CM

## 2015-09-13 DIAGNOSIS — D649 Anemia, unspecified: Secondary | ICD-10-CM

## 2015-09-13 LAB — CBC WITH DIFFERENTIAL/PLATELET
BASO%: 0.9 % (ref 0.0–2.0)
BASOS ABS: 0 10*3/uL (ref 0.0–0.1)
EOS ABS: 0.1 10*3/uL (ref 0.0–0.5)
EOS%: 3.7 % (ref 0.0–7.0)
HEMATOCRIT: 27.4 % — AB (ref 38.4–49.9)
HEMOGLOBIN: 8.5 g/dL — AB (ref 13.0–17.1)
LYMPH%: 21.4 % (ref 14.0–49.0)
MCH: 28.4 pg (ref 27.2–33.4)
MCHC: 31.1 g/dL — ABNORMAL LOW (ref 32.0–36.0)
MCV: 91.5 fL (ref 79.3–98.0)
MONO#: 0.3 10*3/uL (ref 0.1–0.9)
MONO%: 8.7 % (ref 0.0–14.0)
NEUT#: 1.9 10*3/uL (ref 1.5–6.5)
NEUT%: 65.3 % (ref 39.0–75.0)
PLATELETS: 94 10*3/uL — AB (ref 140–400)
RBC: 2.99 10*6/uL — ABNORMAL LOW (ref 4.20–5.82)
RDW: 16.5 % — ABNORMAL HIGH (ref 11.0–14.6)
WBC: 2.9 10*3/uL — ABNORMAL LOW (ref 4.0–10.3)
lymph#: 0.6 10*3/uL — ABNORMAL LOW (ref 0.9–3.3)

## 2015-09-13 MED ORDER — DARBEPOETIN ALFA 200 MCG/0.4ML IJ SOSY
200.0000 ug | PREFILLED_SYRINGE | Freq: Once | INTRAMUSCULAR | Status: AC
  Administered 2015-09-13: 200 ug via SUBCUTANEOUS
  Filled 2015-09-13: qty 0.4

## 2015-09-13 NOTE — Telephone Encounter (Signed)
pt called to r/s lmissed appt....pt ok and aware

## 2015-09-14 ENCOUNTER — Telehealth: Payer: Self-pay | Admitting: *Deleted

## 2015-09-14 NOTE — Telephone Encounter (Signed)
-----   Message from Ladell Pier, MD sent at 09/14/2015  3:29 PM EDT ----- Please call patient, platelets are low, stop anagrelide, f/u for cbc as scheduled

## 2015-09-14 NOTE — Telephone Encounter (Signed)
Per Dr. Benay Spice, pt.'s wife notified that platelets are low and to stop anagrelide.  Instructed pt.'s wife to return as scheduled on 09/26/15.  Teach back done.  Pt.'s wife has no questions at this time and appreciates phone call.

## 2015-09-19 ENCOUNTER — Encounter (HOSPITAL_COMMUNITY): Payer: Self-pay | Admitting: *Deleted

## 2015-09-19 ENCOUNTER — Emergency Department (HOSPITAL_COMMUNITY)
Admission: EM | Admit: 2015-09-19 | Discharge: 2015-10-03 | Disposition: E | Payer: Medicare Other | Attending: Emergency Medicine | Admitting: Emergency Medicine

## 2015-09-19 DIAGNOSIS — Y92009 Unspecified place in unspecified non-institutional (private) residence as the place of occurrence of the external cause: Secondary | ICD-10-CM | POA: Insufficient documentation

## 2015-09-19 DIAGNOSIS — I469 Cardiac arrest, cause unspecified: Secondary | ICD-10-CM | POA: Diagnosis not present

## 2015-09-19 DIAGNOSIS — X749XXA Intentional self-harm by unspecified firearm discharge, initial encounter: Secondary | ICD-10-CM

## 2015-09-19 DIAGNOSIS — Y9389 Activity, other specified: Secondary | ICD-10-CM | POA: Insufficient documentation

## 2015-09-19 DIAGNOSIS — Y999 Unspecified external cause status: Secondary | ICD-10-CM | POA: Insufficient documentation

## 2015-09-19 DIAGNOSIS — S0180XA Unspecified open wound of other part of head, initial encounter: Secondary | ICD-10-CM | POA: Diagnosis present

## 2015-09-19 DIAGNOSIS — T794XXA Traumatic shock, initial encounter: Secondary | ICD-10-CM | POA: Insufficient documentation

## 2015-09-19 DIAGNOSIS — X72XXXA Intentional self-harm by handgun discharge, initial encounter: Secondary | ICD-10-CM | POA: Insufficient documentation

## 2015-09-19 DIAGNOSIS — R578 Other shock: Secondary | ICD-10-CM

## 2015-09-19 LAB — PREPARE FRESH FROZEN PLASMA
UNIT DIVISION: 0
UNIT DIVISION: 0

## 2015-09-19 MED ORDER — SODIUM CHLORIDE 0.9 % IV SOLN
INTRAVENOUS | Status: AC | PRN
  Administered 2015-09-19: 1000 mL via INTRAVENOUS

## 2015-09-19 MED ORDER — EPINEPHRINE HCL 0.1 MG/ML IJ SOSY
PREFILLED_SYRINGE | INTRAMUSCULAR | Status: AC | PRN
  Administered 2015-09-19 (×3): 1 via INTRAVENOUS

## 2015-09-19 MED ORDER — SODIUM BICARBONATE 8.4 % IV SOLN
INTRAVENOUS | Status: AC | PRN
  Administered 2015-09-19: 100 meq via INTRAVENOUS
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
## 2015-09-19 NOTE — ED Notes (Signed)
Large hemorrhaging to mouth. Constant suction by Yankauer.

## 2015-09-19 NOTE — ED Notes (Signed)
Pt intubated by Dr Ellender Hose, 23 lip.

## 2015-09-19 NOTE — ED Notes (Signed)
Pt asystole.

## 2015-09-19 NOTE — ED Notes (Addendum)
Pulse check, pt asystole. CPR restarted.

## 2015-09-19 NOTE — ED Notes (Signed)
Patient time of death occurred at 1950/09/23 pronounced by Dr Kathrynn Humble.
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
## 2015-09-19 NOTE — ED Notes (Signed)
Pulse check, no organized cardiac activity on ultrasound. No pulse.

## 2015-09-19 NOTE — ED Notes (Signed)
[name] from [facility] went to the morgue and collected the pt's belongings at 22:50. Informed Cassie - RN and Manson Allan.
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
## 2015-09-19 NOTE — ED Notes (Signed)
Pulse check, PEA, no pulse. CPR restarted.

## 2015-09-20 ENCOUNTER — Encounter (HOSPITAL_COMMUNITY): Payer: Self-pay

## 2015-09-22 ENCOUNTER — Telehealth: Payer: Self-pay | Admitting: *Deleted

## 2015-09-22 NOTE — Telephone Encounter (Signed)
Patient's wife [name] called to let me know that [name] had died.

## 2015-09-26 ENCOUNTER — Ambulatory Visit: Payer: Medicare Other

## 2015-09-26 ENCOUNTER — Other Ambulatory Visit: Payer: Medicare Other

## 2015-10-03 DEATH — deceased

## 2015-10-10 ENCOUNTER — Other Ambulatory Visit: Payer: Medicare Other

## 2015-10-10 ENCOUNTER — Ambulatory Visit: Payer: Medicare Other

## 2015-10-24 ENCOUNTER — Ambulatory Visit: Payer: Medicare Other | Admitting: Oncology

## 2015-10-24 ENCOUNTER — Other Ambulatory Visit: Payer: Medicare Other

## 2015-10-24 ENCOUNTER — Ambulatory Visit: Payer: Medicare Other

## 2015-11-23 ENCOUNTER — Other Ambulatory Visit: Payer: Self-pay | Admitting: Nurse Practitioner

## 2016-06-24 IMAGING — CR DG ABDOMEN 2V
2 series · 2 of 2 positions shown · non-contrast
Comparison: None.

CLINICAL DATA: Assess for retained endoscopic camera capsule

EXAM:
ABDOMEN - 2 VIEW

[view not recorded (1 of 2)]
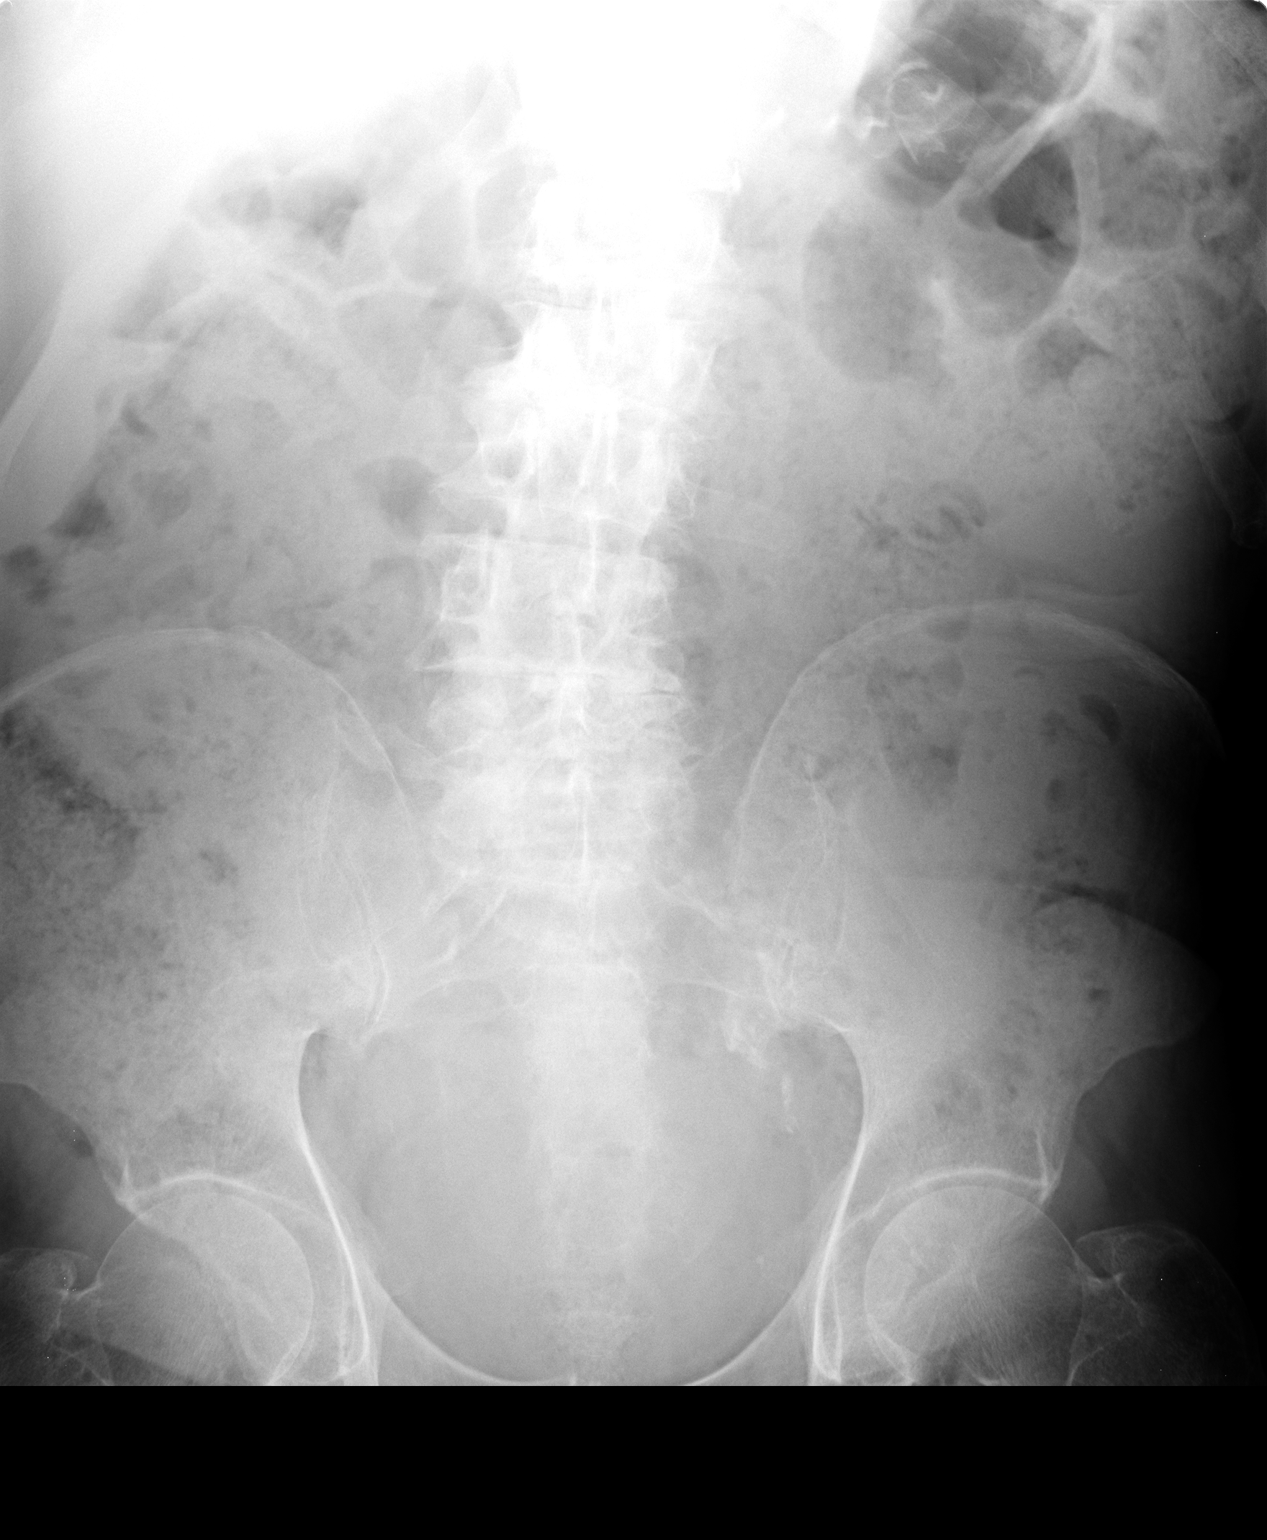

[view not recorded (2 of 2)]
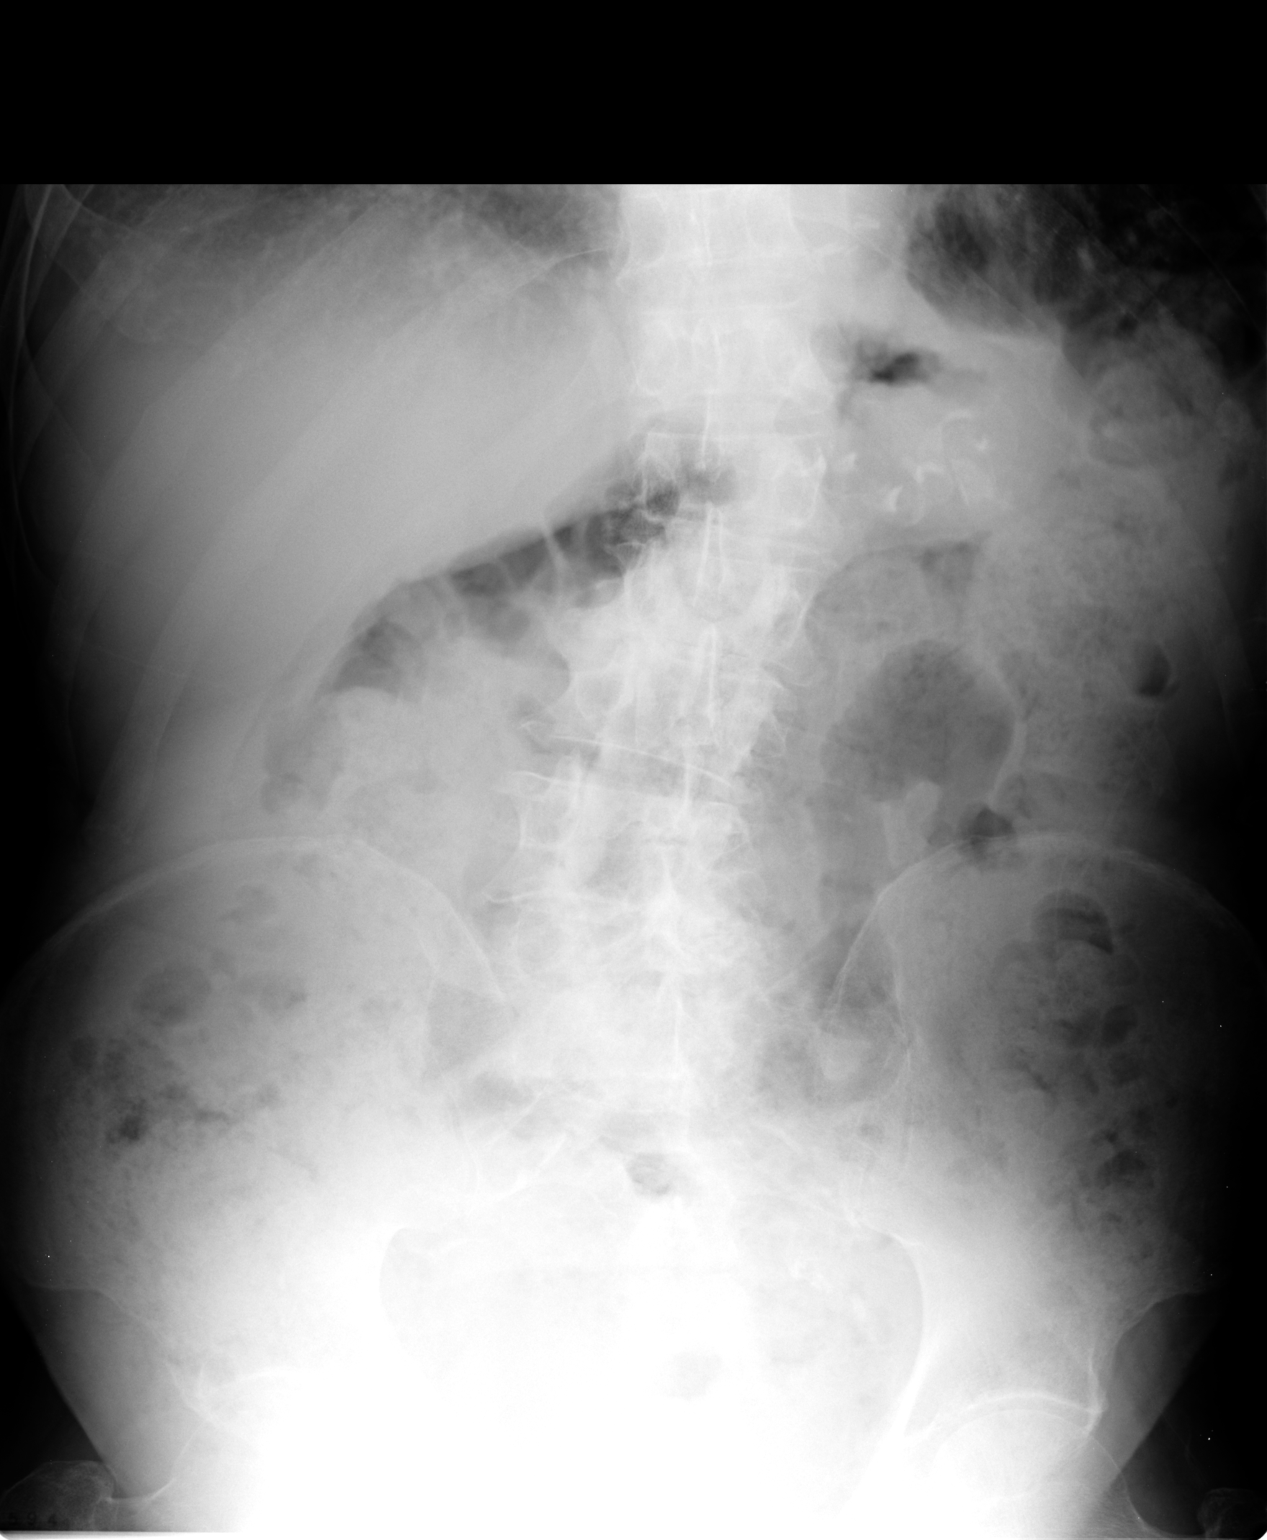

[2 of 2 positions shown; findings below may reference images not displayed]

FINDINGS: There is moderately increased stool burden throughout the colon. No
radiopaque camera capsule is demonstrated. There is no small or
large bowel obstructive pattern. There are degenerative changes of
the lumbar spine with gentle dextrocurvature
IMPRESSION: No retained endoscopic capsule is demonstrated. Increase colonic
stool burden may reflect clinical constipation.

## 2016-07-19 IMAGING — CT CT CERVICAL SPINE W/O CM
4 of 6 series · 14 of 33 positions shown, 16 images · non-contrast
Comparison: MR brain August 26, 2013

CLINICAL DATA: Status post fall on the bathroom with laceration to
the right parietal and occipital region. Patient has a history of
prostate cancer.

EXAM:
CT HEAD WITHOUT CONTRAST
CT CERVICAL SPINE WITHOUT CONTRAST
TECHNIQUE: Multidetector CT imaging of the head and cervical spine was
performed following the standard protocol without intravenous
contrast. Multiplanar CT image reconstructions of the cervical spine
were also generated.

[Series 302: soft tissue, idose (2) · axial · 0.32mm/px · z∈[+73,+177]mm · 3 of 105 slices shown]
[im 27/105  soft-tissue]
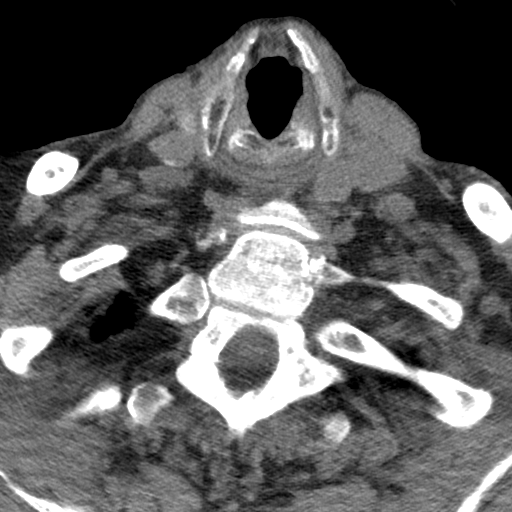
[im 53/105  soft-tissue]
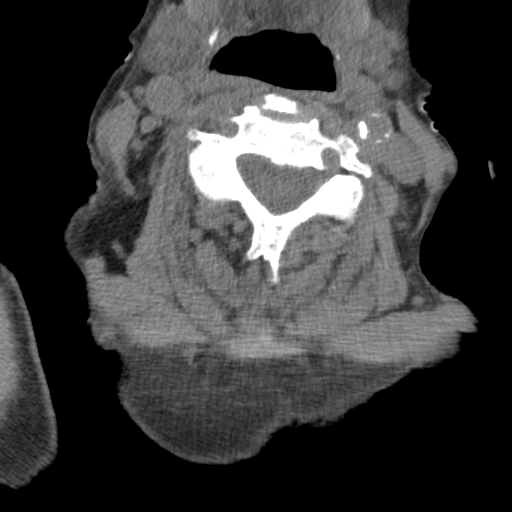
[im 79/105  soft-tissue]
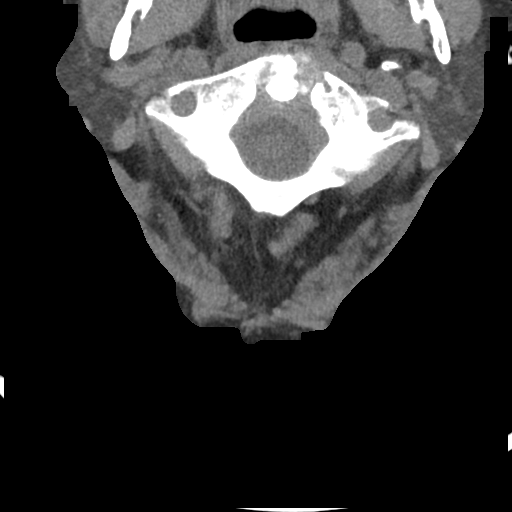

[Series 304: coronal, idose (2) · coronal · 0.36mm/px · 3 of 43 slices shown]
[im 9/43  bone]
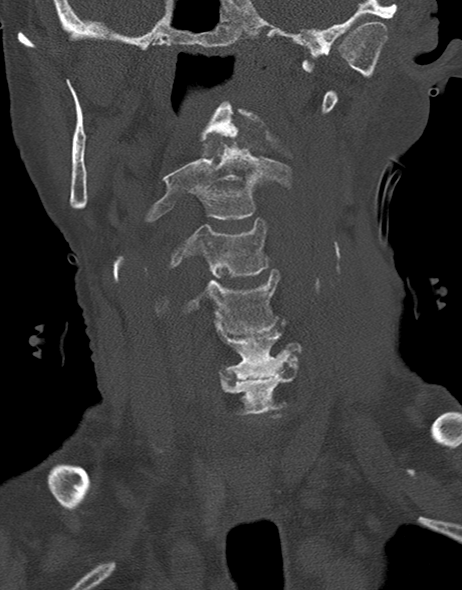
[im 17/43  bone]
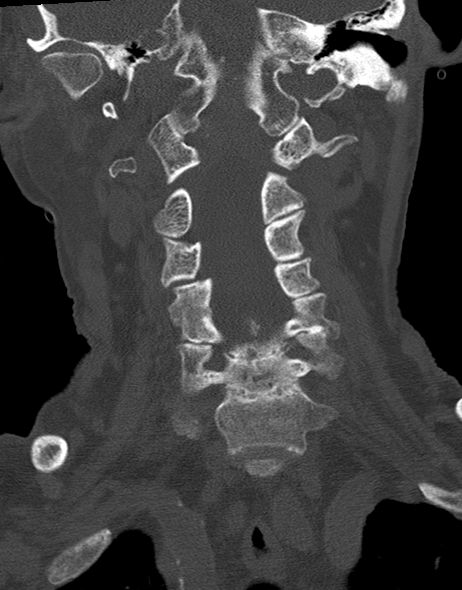
[im 26/43  bone]
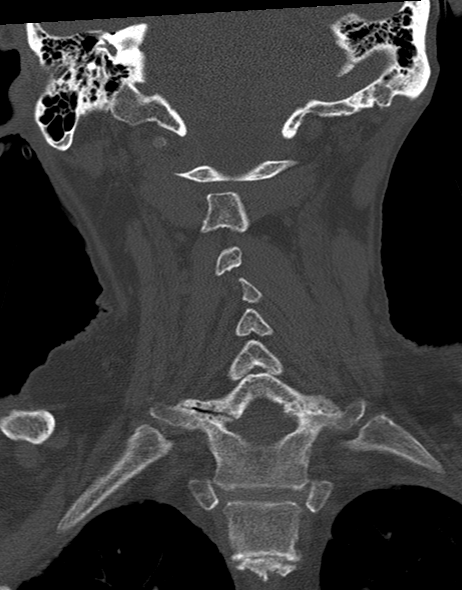

[Series 305: sagittal, idose (2) · sagittal · 0.32mm/px · 5 of 56 slices shown, 6 images]
[im 19/56  bone]
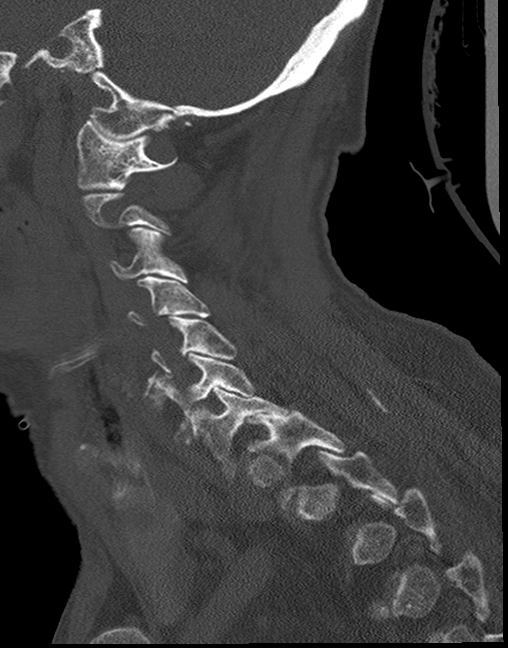
[im 23/56  bone]
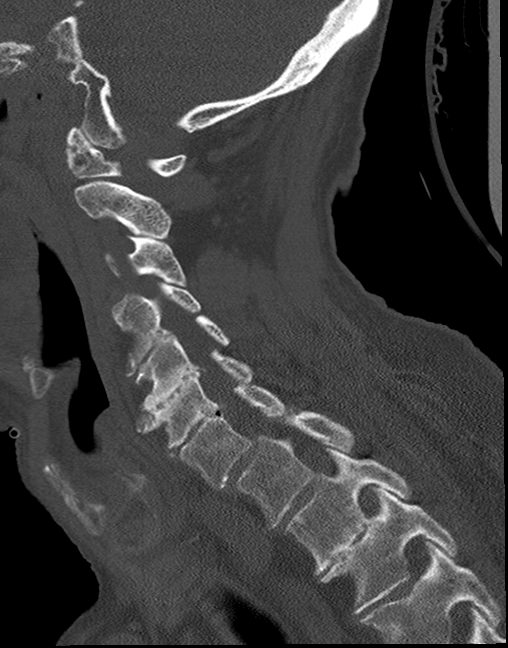
[im 28/56  soft-tissue]
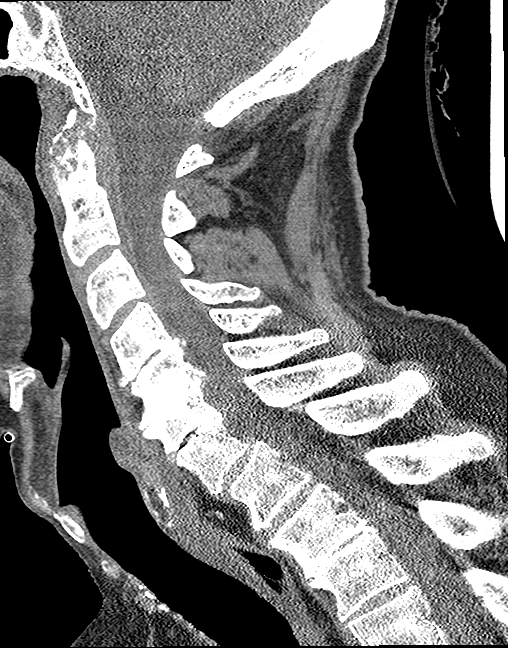
[im 28/56  bone]
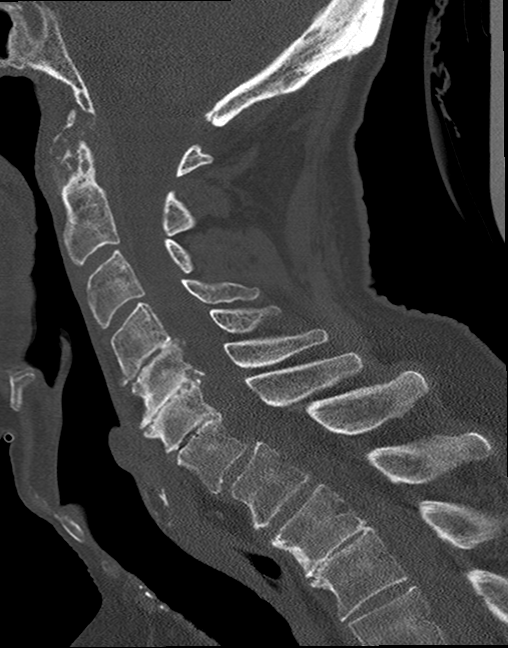
[im 33/56  bone]
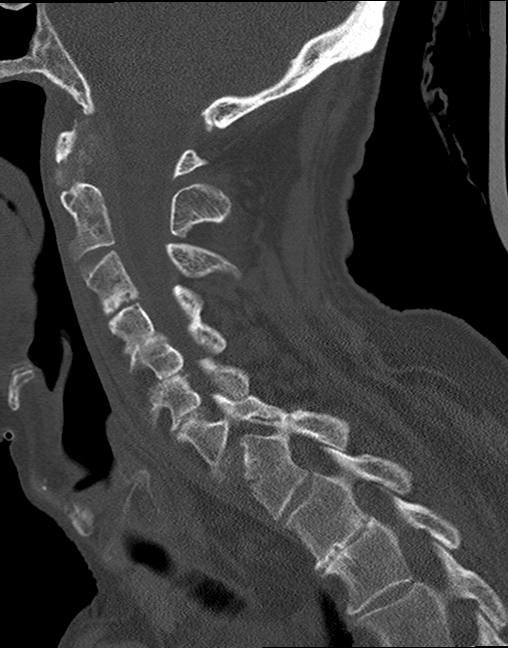
[im 37/56  bone]
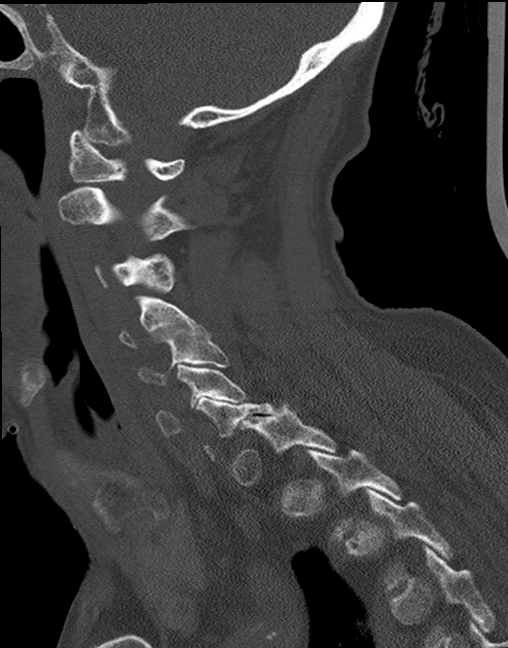

[Series 306: orthogonals, idose (2) · axial · 0.40mm/px · z∈[+65,+159]mm · 3 of 103 slices shown, 4 images]
[im 26/103  soft-tissue]
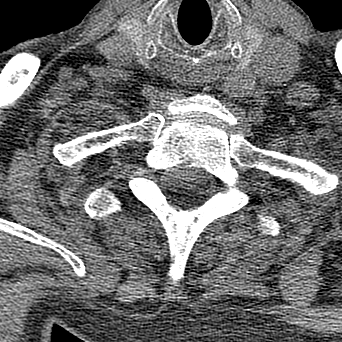
[im 26/103  bone]
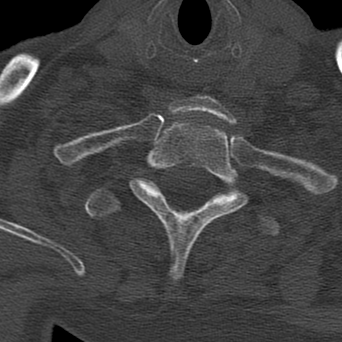
[im 52/103  bone]
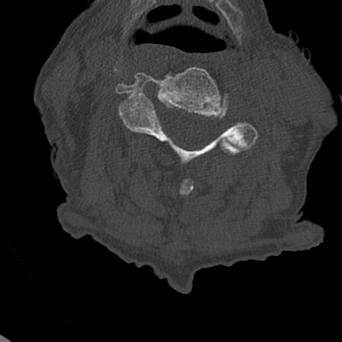
[im 77/103  bone]
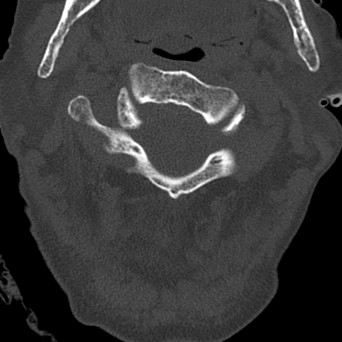

[14 of 33 positions shown; findings below may reference images not displayed]

FINDINGS: CT HEAD FINDINGS

There is chronic diffuse atrophy. There is no midline shift,
hydrocephalus, or mass. No acute hemorrhage or acute transcortical
infarct is identified. The bony calvarium is intact. There is right
frontal parietal scalp swelling and hematoma. Complete opacity of
bilateral maxillary sinuses are identified.

CT CERVICAL SPINE FINDINGS

There is no acute fracture or dislocation. Degenerative joint
changes of the spine are identified. The prevertebral soft tissues
are normal. There is a 1.4 x 0.7 cm lytic lesion in the anterior
left C1.
IMPRESSION: Right frontal/ parietal scalp swelling and hematoma. No intracranial
hemorrhage is identified.

No acute fracture or dislocation of cervical spine.

## 2016-07-19 IMAGING — CR DG HIP (WITH OR WITHOUT PELVIS) 1V PORT*R*
3 series · 3 of 3 positions shown · non-contrast
Comparison: None.

CLINICAL DATA: 82-year-old male with fall on right hip this morning
with right hip pain. Initial encounter.

EXAM:
RIGHT HIP (WITH PELVIS) 1 VIEW PORTABLE

[AP (1 of 2)]
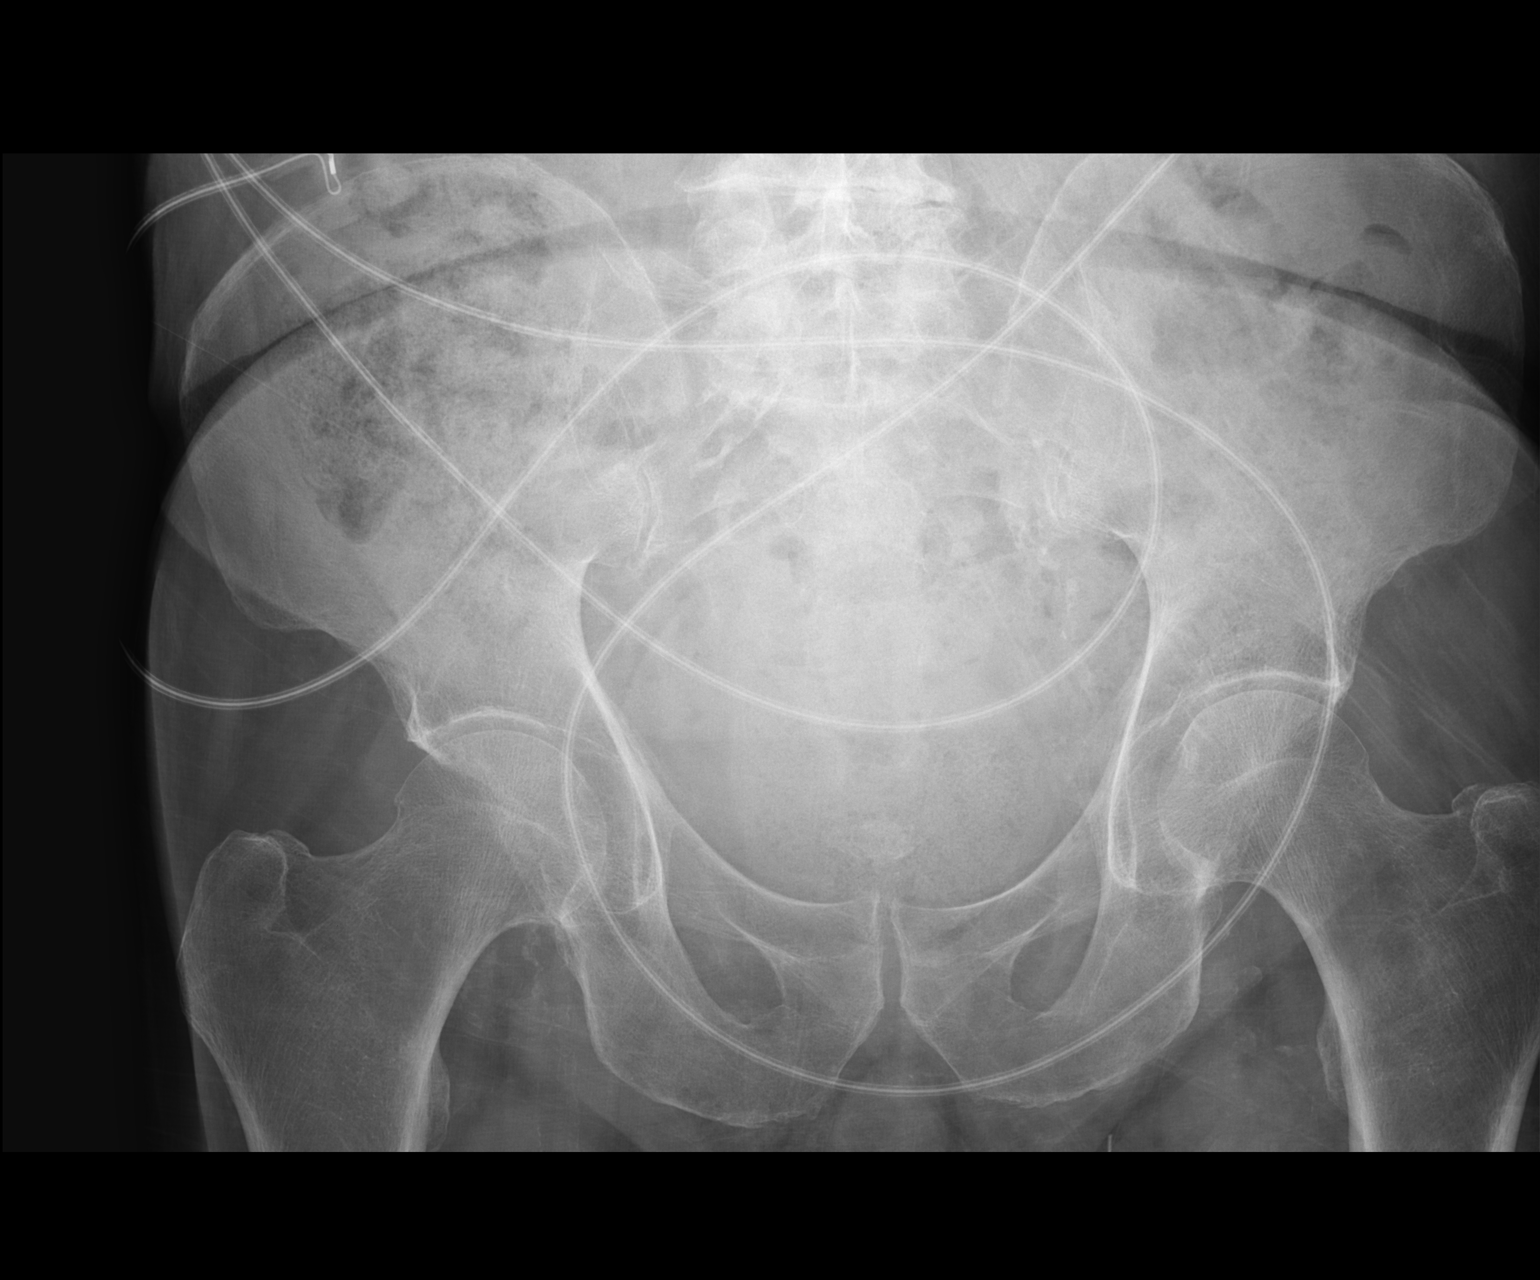

[lateral]
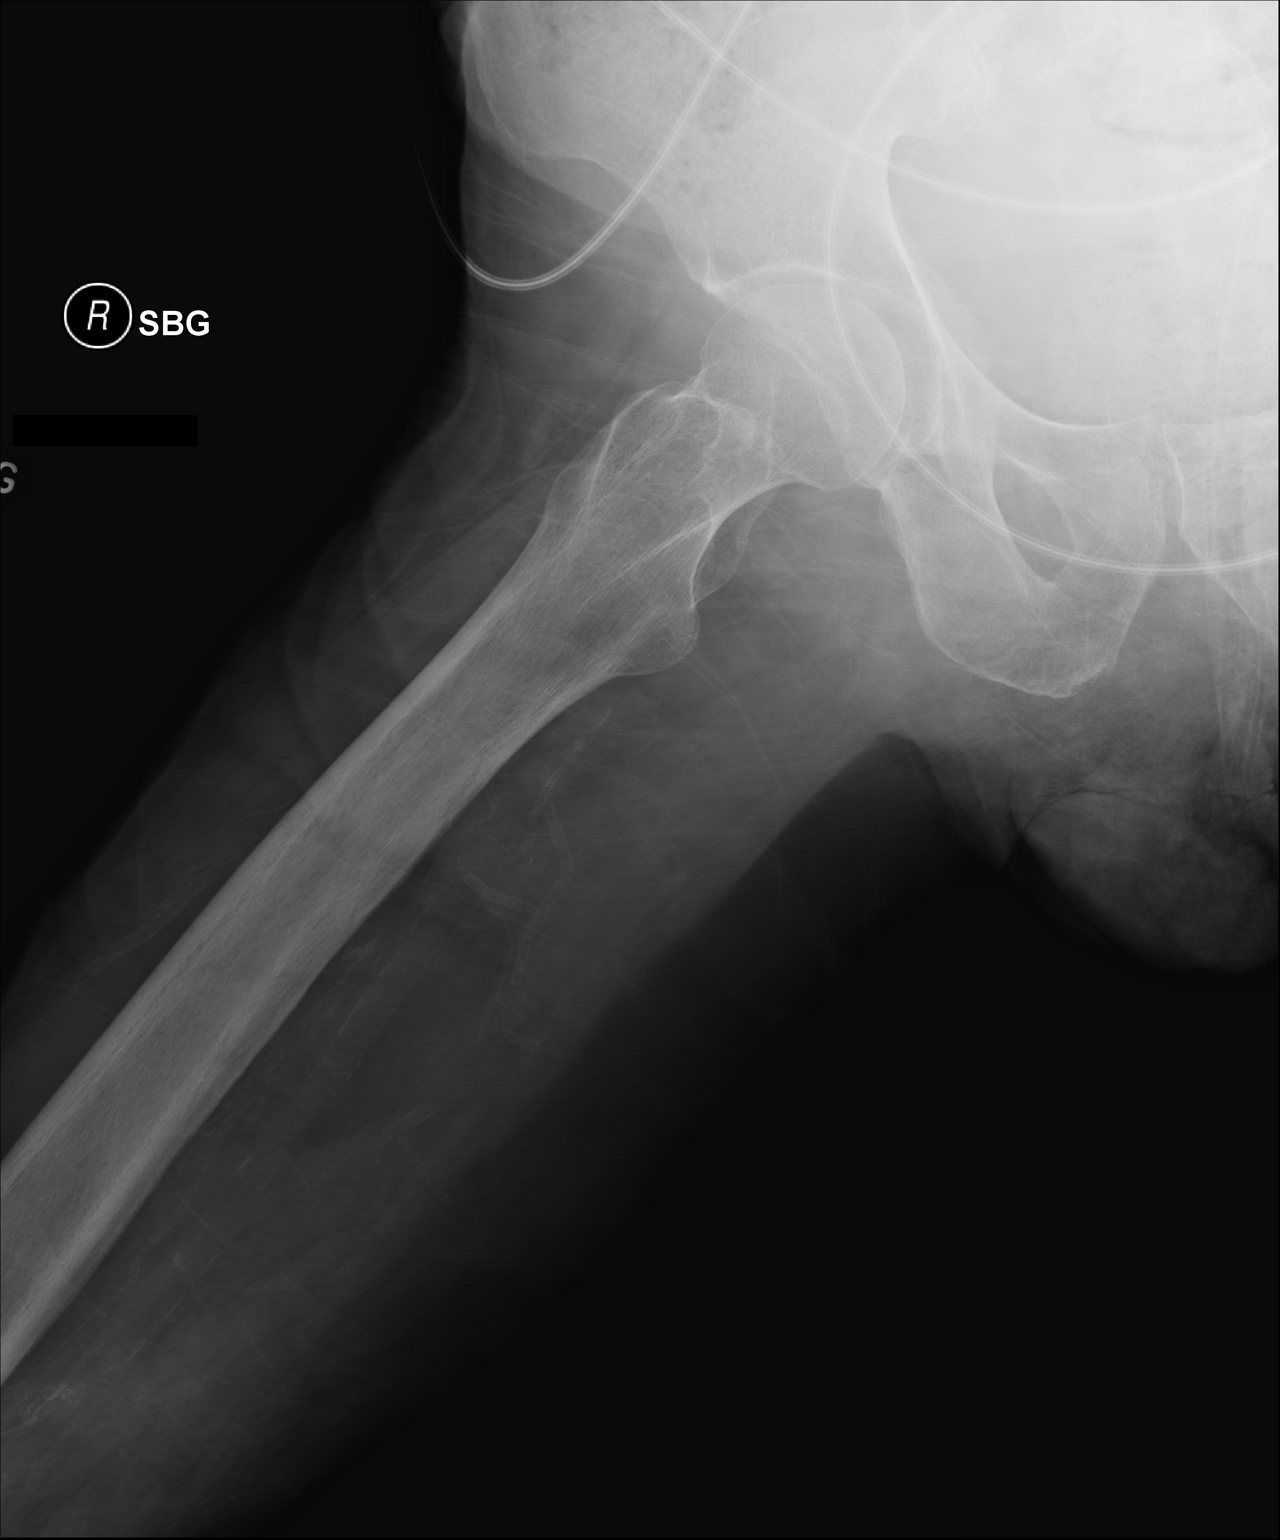

[AP (2 of 2)]
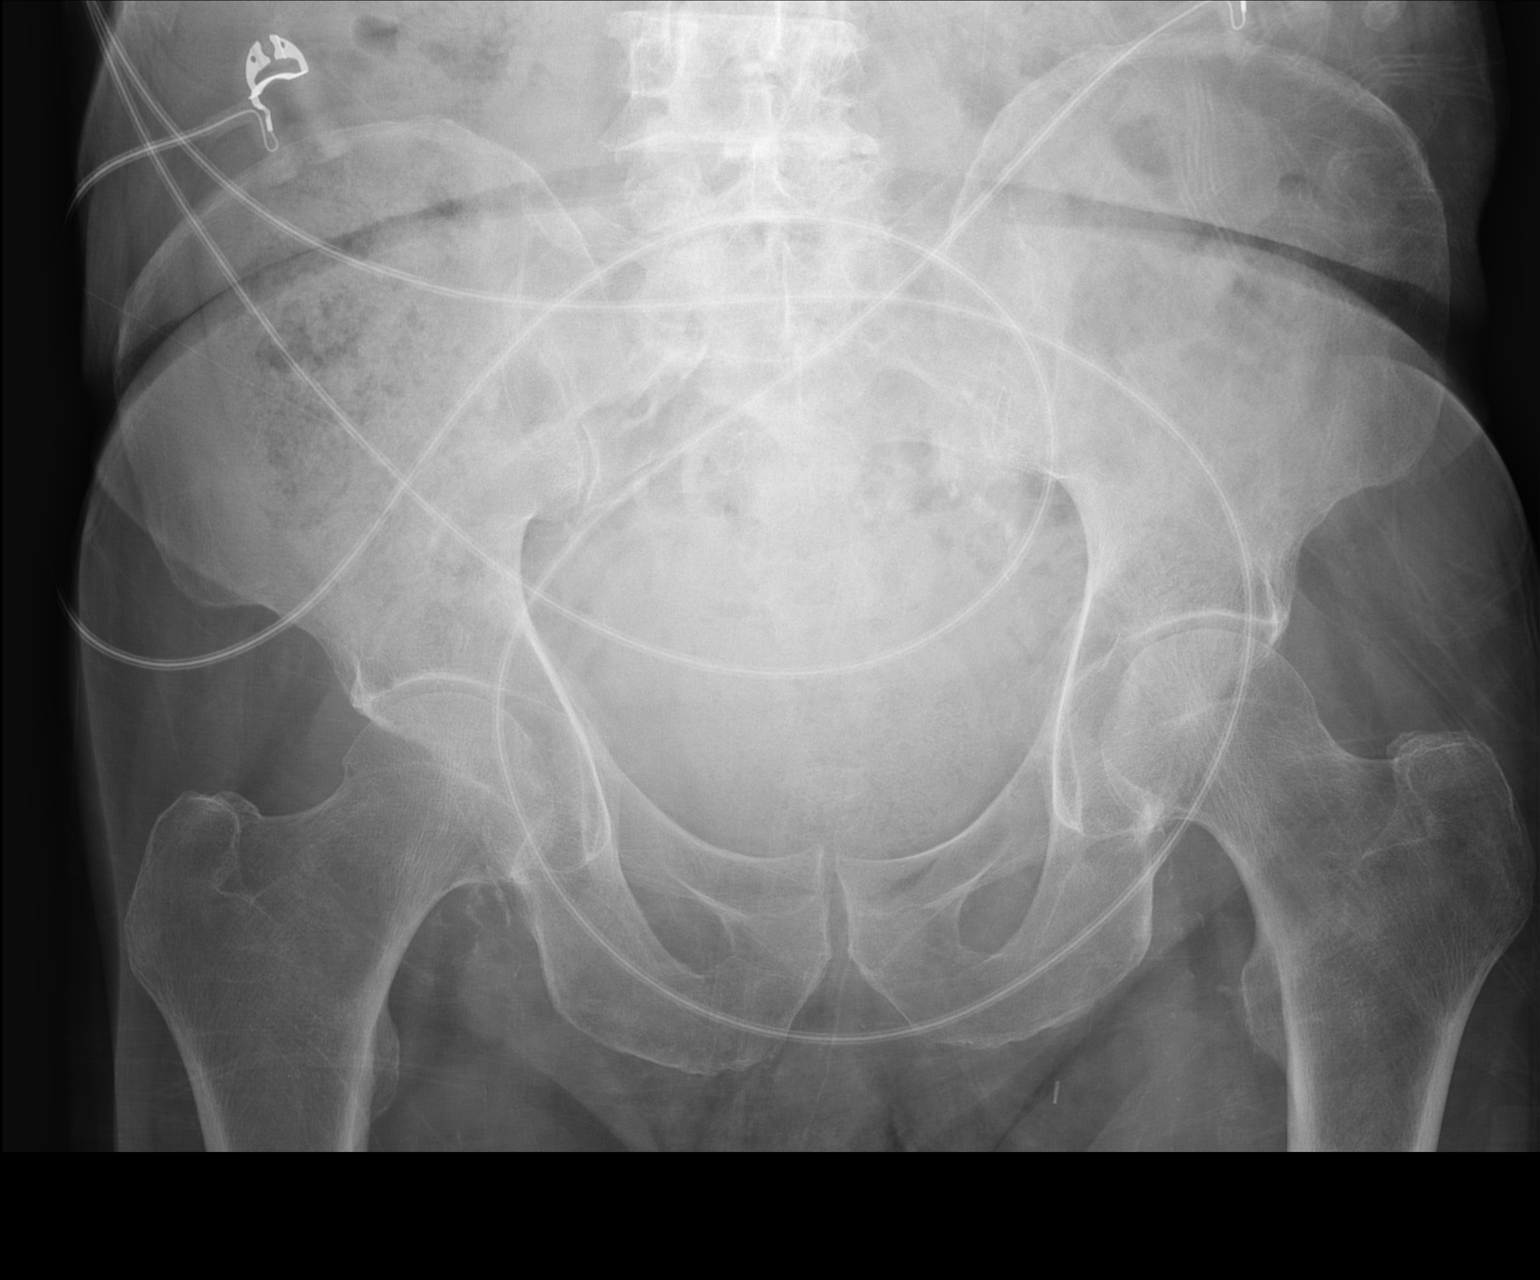

[3 of 3 positions shown; findings below may reference images not displayed]

FINDINGS: There is no evidence of hip fracture or dislocation. There is no
evidence of arthropathy or other focal bone abnormality.
IMPRESSION: Negative.
